# Patient Record
Sex: Female | Born: 1945
Health system: Southern US, Community
[De-identification: ages and names within clinical notes are randomized; demographics above are authoritative.]

## PROBLEM LIST (undated history)

## (undated) DIAGNOSIS — C801 Malignant (primary) neoplasm, unspecified: Secondary | ICD-10-CM

## (undated) DIAGNOSIS — E0590 Thyrotoxicosis, unspecified without thyrotoxic crisis or storm: Secondary | ICD-10-CM

## (undated) DIAGNOSIS — R7303 Prediabetes: Secondary | ICD-10-CM

## (undated) DIAGNOSIS — E559 Vitamin D deficiency, unspecified: Secondary | ICD-10-CM

## (undated) DIAGNOSIS — I10 Essential (primary) hypertension: Secondary | ICD-10-CM

## (undated) DIAGNOSIS — R269 Unspecified abnormalities of gait and mobility: Secondary | ICD-10-CM

## (undated) DIAGNOSIS — E785 Hyperlipidemia, unspecified: Secondary | ICD-10-CM

## (undated) DIAGNOSIS — M1990 Unspecified osteoarthritis, unspecified site: Secondary | ICD-10-CM

## (undated) DIAGNOSIS — Z9289 Personal history of other medical treatment: Secondary | ICD-10-CM

## (undated) DIAGNOSIS — E669 Obesity, unspecified: Secondary | ICD-10-CM

## (undated) DIAGNOSIS — D649 Anemia, unspecified: Secondary | ICD-10-CM

## (undated) DIAGNOSIS — G629 Polyneuropathy, unspecified: Secondary | ICD-10-CM

## (undated) DIAGNOSIS — Z972 Presence of dental prosthetic device (complete) (partial): Secondary | ICD-10-CM

## (undated) DIAGNOSIS — Z973 Presence of spectacles and contact lenses: Secondary | ICD-10-CM

## (undated) DIAGNOSIS — Z96642 Presence of left artificial hip joint: Secondary | ICD-10-CM

## (undated) HISTORY — PX: ROTATOR CUFF REPAIR: SHX139

## (undated) HISTORY — DX: Prediabetes: R73.03

## (undated) HISTORY — PX: CERVICAL DISC ARTHROPLASTY: SHX587

## (undated) HISTORY — DX: Presence of left artificial hip joint: Z96.642

## (undated) HISTORY — DX: Thyrotoxicosis, unspecified without thyrotoxic crisis or storm: E05.90

## (undated) HISTORY — DX: Vitamin D deficiency, unspecified: E55.9

## (undated) HISTORY — PX: SPINAL FUSION: SHX223

## (undated) HISTORY — PX: OTHER SURGICAL HISTORY: SHX169

## (undated) HISTORY — DX: Polyneuropathy, unspecified: G62.9

## (undated) HISTORY — DX: Obesity, unspecified: E66.9

## (undated) HISTORY — PX: MULTIPLE TOOTH EXTRACTIONS: SHX2053

## (undated) HISTORY — DX: Unspecified abnormalities of gait and mobility: R26.9

## (undated) HISTORY — PX: TONSILLECTOMY: SUR1361

## (undated) HISTORY — DX: Essential (primary) hypertension: I10

## (undated) HISTORY — DX: Hyperlipidemia, unspecified: E78.5

## (undated) HISTORY — PX: VAGINAL HYSTERECTOMY: SHX2639

---

## 1998-07-19 ENCOUNTER — Ambulatory Visit (HOSPITAL_COMMUNITY): Admission: RE | Admit: 1998-07-19 | Discharge: 1998-07-19 | Payer: Self-pay | Admitting: Internal Medicine

## 1999-04-30 ENCOUNTER — Ambulatory Visit (HOSPITAL_COMMUNITY): Admission: RE | Admit: 1999-04-30 | Discharge: 1999-04-30 | Payer: Self-pay | Admitting: Neurosurgery

## 1999-04-30 ENCOUNTER — Encounter: Payer: Self-pay | Admitting: Neurosurgery

## 1999-05-01 ENCOUNTER — Ambulatory Visit (HOSPITAL_COMMUNITY): Admission: RE | Admit: 1999-05-01 | Discharge: 1999-05-01 | Payer: Self-pay | Admitting: Neurosurgery

## 1999-05-01 ENCOUNTER — Encounter: Payer: Self-pay | Admitting: Neurosurgery

## 1999-07-30 ENCOUNTER — Encounter: Payer: Self-pay | Admitting: Neurosurgery

## 1999-07-30 ENCOUNTER — Ambulatory Visit (HOSPITAL_COMMUNITY): Admission: RE | Admit: 1999-07-30 | Discharge: 1999-07-30 | Payer: Self-pay | Admitting: Neurosurgery

## 1999-09-10 ENCOUNTER — Encounter: Payer: Self-pay | Admitting: Neurosurgery

## 1999-09-10 ENCOUNTER — Ambulatory Visit (HOSPITAL_COMMUNITY): Admission: RE | Admit: 1999-09-10 | Discharge: 1999-09-10 | Payer: Self-pay | Admitting: Neurosurgery

## 1999-10-14 ENCOUNTER — Ambulatory Visit (HOSPITAL_COMMUNITY): Admission: RE | Admit: 1999-10-14 | Discharge: 1999-10-14 | Payer: Self-pay | Admitting: Neurosurgery

## 2001-04-06 ENCOUNTER — Ambulatory Visit (HOSPITAL_COMMUNITY): Admission: RE | Admit: 2001-04-06 | Discharge: 2001-04-06 | Payer: Self-pay | Admitting: *Deleted

## 2001-04-06 ENCOUNTER — Encounter: Payer: Self-pay | Admitting: Neurosurgery

## 2001-04-09 ENCOUNTER — Ambulatory Visit (HOSPITAL_COMMUNITY): Admission: RE | Admit: 2001-04-09 | Discharge: 2001-04-09 | Payer: Self-pay | Admitting: Neurosurgery

## 2001-04-09 ENCOUNTER — Encounter: Payer: Self-pay | Admitting: Neurosurgery

## 2002-04-14 ENCOUNTER — Encounter: Admission: RE | Admit: 2002-04-14 | Discharge: 2002-04-14 | Payer: Self-pay | Admitting: Neurosurgery

## 2002-04-14 ENCOUNTER — Encounter: Payer: Self-pay | Admitting: Neurosurgery

## 2004-01-26 ENCOUNTER — Emergency Department (HOSPITAL_COMMUNITY): Admission: AD | Admit: 2004-01-26 | Discharge: 2004-01-26 | Payer: Self-pay | Admitting: Family Medicine

## 2004-03-01 ENCOUNTER — Ambulatory Visit (HOSPITAL_COMMUNITY): Admission: RE | Admit: 2004-03-01 | Discharge: 2004-03-01 | Payer: Self-pay | Admitting: Internal Medicine

## 2004-04-08 ENCOUNTER — Inpatient Hospital Stay (HOSPITAL_COMMUNITY): Admission: RE | Admit: 2004-04-08 | Discharge: 2004-04-12 | Payer: Self-pay | Admitting: Orthopedic Surgery

## 2004-07-23 ENCOUNTER — Ambulatory Visit (HOSPITAL_COMMUNITY): Admission: RE | Admit: 2004-07-23 | Discharge: 2004-07-24 | Payer: Self-pay | Admitting: Orthopedic Surgery

## 2007-02-24 ENCOUNTER — Other Ambulatory Visit: Admission: RE | Admit: 2007-02-24 | Discharge: 2007-02-24 | Payer: Self-pay | Admitting: Internal Medicine

## 2007-03-10 ENCOUNTER — Ambulatory Visit: Payer: Self-pay | Admitting: Gastroenterology

## 2007-03-29 ENCOUNTER — Ambulatory Visit: Payer: Self-pay | Admitting: Gastroenterology

## 2007-03-29 ENCOUNTER — Encounter (INDEPENDENT_AMBULATORY_CARE_PROVIDER_SITE_OTHER): Payer: Self-pay | Admitting: *Deleted

## 2009-03-16 ENCOUNTER — Ambulatory Visit (HOSPITAL_COMMUNITY): Admission: RE | Admit: 2009-03-16 | Discharge: 2009-03-16 | Payer: Self-pay | Admitting: Orthopedic Surgery

## 2009-04-18 ENCOUNTER — Ambulatory Visit (HOSPITAL_COMMUNITY): Admission: RE | Admit: 2009-04-18 | Discharge: 2009-04-18 | Payer: Self-pay | Admitting: Internal Medicine

## 2009-05-22 ENCOUNTER — Inpatient Hospital Stay (HOSPITAL_COMMUNITY): Admission: RE | Admit: 2009-05-22 | Discharge: 2009-05-26 | Payer: Self-pay | Admitting: Orthopedic Surgery

## 2009-08-22 ENCOUNTER — Encounter (HOSPITAL_COMMUNITY): Admission: RE | Admit: 2009-08-22 | Discharge: 2009-11-02 | Payer: Self-pay | Admitting: Internal Medicine

## 2009-08-27 ENCOUNTER — Encounter: Admission: RE | Admit: 2009-08-27 | Discharge: 2009-08-27 | Payer: Self-pay | Admitting: Internal Medicine

## 2009-09-19 ENCOUNTER — Other Ambulatory Visit: Admission: RE | Admit: 2009-09-19 | Discharge: 2009-09-19 | Payer: Self-pay | Admitting: Interventional Radiology

## 2009-09-19 ENCOUNTER — Encounter (INDEPENDENT_AMBULATORY_CARE_PROVIDER_SITE_OTHER): Payer: Self-pay | Admitting: Interventional Radiology

## 2009-09-19 ENCOUNTER — Encounter: Admission: RE | Admit: 2009-09-19 | Discharge: 2009-09-19 | Payer: Self-pay | Admitting: Endocrinology

## 2010-09-04 ENCOUNTER — Encounter (HOSPITAL_COMMUNITY)
Admission: RE | Admit: 2010-09-04 | Discharge: 2010-12-03 | Payer: Self-pay | Source: Home / Self Care | Admitting: Internal Medicine

## 2011-01-19 ENCOUNTER — Encounter: Payer: Self-pay | Admitting: Neurosurgery

## 2011-04-08 LAB — CBC
HCT: 24.8 % — ABNORMAL LOW (ref 36.0–46.0)
HCT: 29.8 % — ABNORMAL LOW (ref 36.0–46.0)
HCT: 36.3 % (ref 36.0–46.0)
Hemoglobin: 10.2 g/dL — ABNORMAL LOW (ref 12.0–15.0)
Hemoglobin: 8.5 g/dL — ABNORMAL LOW (ref 12.0–15.0)
Hemoglobin: 8.5 g/dL — ABNORMAL LOW (ref 12.0–15.0)
MCHC: 34.3 g/dL (ref 30.0–36.0)
MCHC: 34.5 g/dL (ref 30.0–36.0)
MCHC: 34.7 g/dL (ref 30.0–36.0)
MCHC: 34.9 g/dL (ref 30.0–36.0)
MCHC: 35.7 g/dL (ref 30.0–36.0)
MCV: 96.7 fL (ref 78.0–100.0)
MCV: 96.8 fL (ref 78.0–100.0)
MCV: 97 fL (ref 78.0–100.0)
MCV: 97.1 fL (ref 78.0–100.0)
MCV: 97.1 fL (ref 78.0–100.0)
Platelets: 171 10*3/uL (ref 150–400)
Platelets: 173 10*3/uL (ref 150–400)
Platelets: 231 10*3/uL (ref 150–400)
Platelets: 270 10*3/uL (ref 150–400)
RBC: 2.47 MIL/uL — ABNORMAL LOW (ref 3.87–5.11)
RBC: 2.5 MIL/uL — ABNORMAL LOW (ref 3.87–5.11)
RBC: 3.07 MIL/uL — ABNORMAL LOW (ref 3.87–5.11)
RBC: 4.13 MIL/uL (ref 3.87–5.11)
RDW: 13.5 % (ref 11.5–15.5)
RDW: 13.5 % (ref 11.5–15.5)
RDW: 13.5 % (ref 11.5–15.5)
RDW: 13.6 % (ref 11.5–15.5)
RDW: 13.7 % (ref 11.5–15.5)
WBC: 13.4 10*3/uL — ABNORMAL HIGH (ref 4.0–10.5)
WBC: 7.4 10*3/uL (ref 4.0–10.5)

## 2011-04-08 LAB — DIFFERENTIAL
Basophils Absolute: 0 10*3/uL (ref 0.0–0.1)
Basophils Absolute: 0 10*3/uL (ref 0.0–0.1)
Basophils Relative: 0 % (ref 0–1)
Basophils Relative: 0 % (ref 0–1)
Eosinophils Absolute: 0.2 10*3/uL (ref 0.0–0.7)
Eosinophils Relative: 3 % (ref 0–5)
Lymphocytes Relative: 38 % (ref 12–46)
Monocytes Absolute: 0.6 10*3/uL (ref 0.1–1.0)
Monocytes Relative: 6 % (ref 3–12)
Neutro Abs: 3.8 10*3/uL (ref 1.7–7.7)
Neutro Abs: 6.9 10*3/uL (ref 1.7–7.7)
Neutrophils Relative %: 63 % (ref 43–77)

## 2011-04-08 LAB — BASIC METABOLIC PANEL
BUN: 13 mg/dL (ref 6–23)
BUN: 20 mg/dL (ref 6–23)
CO2: 26 mEq/L (ref 19–32)
CO2: 27 mEq/L (ref 19–32)
CO2: 27 mEq/L (ref 19–32)
Calcium: 10 mg/dL (ref 8.4–10.5)
Calcium: 8.4 mg/dL (ref 8.4–10.5)
Chloride: 103 mEq/L (ref 96–112)
Chloride: 105 mEq/L (ref 96–112)
Creatinine, Ser: 0.94 mg/dL (ref 0.4–1.2)
Creatinine, Ser: 0.95 mg/dL (ref 0.4–1.2)
Creatinine, Ser: 1.13 mg/dL (ref 0.4–1.2)
GFR calc Af Amer: 59 mL/min — ABNORMAL LOW (ref >60)
GFR calc Af Amer: >60 mL/min (ref >60)
Glucose, Bld: 103 mg/dL — ABNORMAL HIGH (ref 70–99)
Glucose, Bld: 121 mg/dL — ABNORMAL HIGH (ref 70–99)

## 2011-04-08 LAB — APTT: aPTT: 28 seconds (ref 24–37)

## 2011-04-08 LAB — TYPE AND SCREEN
ABO/RH(D): O POS
Antibody Screen: NEGATIVE

## 2011-04-08 LAB — URINALYSIS, ROUTINE W REFLEX MICROSCOPIC
Bilirubin Urine: NEGATIVE
Hgb urine dipstick: NEGATIVE
Ketones, ur: NEGATIVE mg/dL
Nitrite: NEGATIVE
Protein, ur: NEGATIVE mg/dL
Urobilinogen, UA: 0.2 mg/dL (ref 0.0–1.0)

## 2011-04-08 LAB — ANAEROBIC CULTURE

## 2011-04-08 LAB — URINE CULTURE

## 2011-04-08 LAB — SEDIMENTATION RATE: Sed Rate: 7 mm/hr (ref 0–22)

## 2011-04-08 LAB — PROTIME-INR
INR: 0.9 (ref 0.00–1.49)
INR: 1.1 (ref 0.00–1.49)
Prothrombin Time: 12.7 seconds (ref 11.6–15.2)
Prothrombin Time: 14.6 seconds (ref 11.6–15.2)
Prothrombin Time: 25.6 seconds — ABNORMAL HIGH (ref 11.6–15.2)

## 2011-04-08 LAB — C-REACTIVE PROTEIN: CRP: 1.5 mg/dL — ABNORMAL HIGH (ref <0.6)

## 2011-04-08 LAB — BODY FLUID CULTURE

## 2011-04-08 LAB — ABO/RH: ABO/RH(D): O POS

## 2011-05-13 NOTE — Op Note (Signed)
NAMETAMEA, BAI                ACCOUNT NO.:  [id number]   MEDICAL RECORD NO.:  [id number]          PATIENT TYPE:  INP   LOCATION:  5036                         FACILITY:  [facility]   PHYSICIAN:  Burnard Bunting, M.D.    DATE OF BIRTH:  April 04, 1946   DATE OF PROCEDURE:  05/22/2009  DATE OF DISCHARGE:                               OPERATIVE REPORT   PREOPERATIVE DIAGNOSIS:  Loose right total knee arthroplasty.   POSTOPERATIVE DIAGNOSIS:  Loose right total knee arthroplasty.   PROCEDURE:  Right total knee arthroplasty revision.   SURGEON:  Burnard Bunting, MD   ASSISTANT:  April Breen   ANESTHESIA:  General endotracheal.   ESTIMATED BLOOD LOSS:  200.   INDICATIONS:  Tiffany [name] is a 65 year old female with loose right TKA  who presents now for operative management.  Preoperative laboratory  values show absence of infection.  Bone scan and plain x-rays showed  subsidence of the tibial component   OPERATIVE FINDINGS:  1. Examination under anesthesia, range of motion 0 to 120 degrees,      reasonable stability, varus-valgus stress.  2. At the time of arthrotomy, no obvious infection or acute      inflammation was noted.  There was serous fluid present within the      knee joint which was sent for culture.  No occult pockets of      purulence were present.   COMPONENTS UTILIZED:  DePuy, rotating platform, size-3 knee with  Universal revision stem fluted 75 x 16 mm with 8 mm posterior augments,  4 mm augment distally laterally, 8 mm augment distally medially with a 3  tibia with a 34 distal sleeve adapter with the sleeves on both the femur  and the tibia.   PROCEDURE IN DETAIL:  The patient was brought to the operating room  where general endotracheal anesthesia was induced.  Preoperative  antibiotics were administered.  Time-out was called.  Right leg was  prepped including the foot with DuraPrep solution and draped in a  sterile manner.  Collier Flowers was used to cover the operative  field.  The leg  was elevated and exsanguinated with an Esmarch.  Tourniquet was  inflated.  Median parapatellar arthrotomy was made over a bolster.  After the anterior approach to the knee, patella was everted.  Synovectomy was performed.  No acute inflammation or angry-appearing  synovitis was present.  The poly was removed from the tibial tray, which  was loose.  Then, the distal femur was resected by placing osteotomes  and oscillating saw at the prosthesis interface.  Femur was removed, had  mild loosening.  Tibia was also removed, it had significant loosening.  All excess cement was removed including the bone plugs.  At this time,  the tibia and femur were prepared in the standard manner for revision  arthroplasty.  It was felt that a sleeve would be necessary for both  sides due to metaphyseal bone loss .  Both canals were then reamed and  the trial stems were placed.  It was noted that distal and posterior  augments would  be necessary for the femur.  Tibial cut was relatively  flat following touch-up cut with the broaching stem in place.  Following  a cut on the femur and tibia, the rotation was set based on with the  knee in 90 degrees of flexion.  Box cut was then made on the femur.  Trial components were placed with excellent patellar tracking using no  thumb technique with a 15 and 17 spacer.  Trial components were then  removed and true components were cemented into position after thorough  irrigation.  Excess cement was removed.  The patient achieved full  extension and full extension with excellent range of motion.  Tourniquet  was released after cement hardening had occurred.  The bleeding points  were encountered and controlled with electrocautery.  Incision was then  closed over Hemovac drain after placing a 17-mm poly tray.  The knee was  then closed over bolster using interrupted 0 Vicryl suture, 2-0 Vicryl  suture, and skin staples.  Solution of Marcaine and morphine  finally  injected to the knee.  Bulky dressing was placed.  The patient had full  extension and good alignment.  The patient tolerated the procedure well  without any immediate complications.  April, RNFA's assistance was  required all times during the case for retraction of important  neurovascular structures or assistance.  Her assistance was a medical  necessity.      Burnard Bunting, M.D.  Electronically Signed     GSD/MEDQ  D:  05/22/2009  T:  05/23/2009  Job:  [id number]

## 2011-05-16 NOTE — Discharge Summary (Signed)
NAMENEVAEH, KORTE                ACCOUNT NO.:  [id number]   MEDICAL RECORD NO.:  [id number]          PATIENT TYPE:  INP   LOCATION:  5036                         FACILITY:  [facility]   PHYSICIAN:  Burnard Bunting, M.D.    DATE OF BIRTH:  1946-04-02   DATE OF ADMISSION:  05/22/2009  DATE OF DISCHARGE:  05/26/2009                               DISCHARGE SUMMARY   ADMISSION DIAGNOSES:  1. Loose right total knee arthroplasty.  2. Hypertension.   DISCHARGE DIAGNOSES:  1. Loose right total knee arthroplasty.  2. Hypertension.  3. Posthemorrhagic anemia.   PROCEDURE:  On May 22, 2009, the patient underwent right total knee  arthroplasty revision, performed by Dr. August Saucer under general anesthesia.   CONSULTATIONS:  None.   BRIEF HISTORY:  The patient is a 65 year old female with loosening of  the prosthesis of her right total knee replacement.  She has undergone  preoperative labs indicating absence of infection.  Bone scan and plain  x-rays have shown subsidence of the tibial component.  As the patient is  having difficulty with pain control and activity, it was felt she would  benefit from surgical intervention and she wished to proceed with  revision of the right total knee arthroplasty.   BRIEF HOSPITAL COURSE:  The patient underwent the procedure under  general anesthesia without complications.  Postoperatively,  neurovascular and motor function of the lower extremities was noted to  be intact.  Her dressing was changed daily and wound was healing without  signs of infection.  She started on physical therapy for ambulation and  gait training, range of motion, stretching and strengthening exercises.  CPM was utilized.  Coumadin was started for DVT prophylaxis and  adjustments made by the pharmacist at the hospital.  Pain was controlled  with p.o. analgesics prior to discharge.  The patient was able to void  after her Foley catheter was discontinued.  At the time of discharge,  she was  afebrile and vital signs were stable.  The patient was  ambulating as much as 70 feet.   PERTINENT LABORATORY VALUES:  On admission, CBC with WBC 11.1,  hemoglobin 13.9, and hematocrit 40.1.  WBC elevated as high as 13.4 on  May 22, 2009.  However, repeat values during the hospital stay showed  normalization of WBC with the lowest value being 8.0.  Hemoglobin and  hematocrit stable at 8.5 and 24.8 respectively.  Sed rate on May 22, 2009, was 7.  Chemistry studies on admission with sodium 134, potassium  5.3, and glucose 111.  Repeat values showed normalization of these  values with the exception of mildly elevated glucose at 103 and 121.  INR and coagulation studies were normal on admission and at discharge  INR 2.2.  C-reactive protein 1.5 on May 22, 2009.  Urinalysis on  admission negative for urinary tract infection.  Fluid from the right  knee on May 22, 2009, taken intraoperatively showed no growth x3 days on  final culture report on May 25, 2009.  Urine culture with final report  on May 18, 2009, showed 40,000  colonies of multiple bacterial  morphotypes.  Anaerobic cultures of the synovial fluid were negative on  final report.   PLAN:  The patient was discharged to home.  Arrangements were made for  home health physical therapy and Coumadin management as well as durable  medical equipment by [facility].  She will continue on CPM  machine 2 hours every 8 hours.  Elevation when at rest and weightbearing  as tolerated for ambulation with a walker.  She will keep her wound dry  and clean.  She will follow up with Dr. August Saucer 2 weeks from the date of  surgery.  The patient is given a medication reconciliation form with  instructions to continue home medications as taken prior to admission  with the exception of hydrocodone.  She will utilize Percocet as needed  for pain and is given a prescription for Coumadin as well.  She is  advised to call the office should there be  questions or concerns prior  to her return office visit.      Wende Neighbors, P.A.      Burnard Bunting, M.D.  Electronically Signed    SMV/MEDQ  D:  06/21/2009  T:  06/22/2009  Job:  [id number]

## 2011-05-16 NOTE — Op Note (Signed)
. [facility]  Patient:    Tiffany [name], Tiffany [name]                         MRN: [id number] Proc. Date: 04/09/01 Attending:  Julio Sicks, M.D.                           Operative Report  PREOPERATIVE DIAGNOSIS:  Left frontal scalp abscess.  POSTOPERATIVE DIAGNOSIS:  Left frontal scalp abscess.  PROCEDURE:  Incision and debridement of left frontal scalp abscess with closure.  SURGEON:  Julio Sicks, M.D.  ANESTHESIA:  General endotracheal.  INDICATIONS:  Ms. Zollinger is a 65 year old female, who had previously suffered a cervical spine fracture with associated spinal cord injury approximately 2-1/2 years ago.  The patient underwent a posterior cervical fusion and was placed in a Halo brace by an outside neurosurgeon.  The patient was from the [place] area and reported back to me for follow-up.  I followed her along and at 3 months post injury was ready for her Halo to come off.  The Halo was removed.  The patient was left with cosmetically unacceptable scars on her forehead secondary to the Halo pin sites.  We discussed options for management, including the possibility of excision of the scars with wound revision.  She underwent this approximately 1-1/2 years ago.  She now presents with a fluctuant left frontal mass beneath the left frontal repair at the pin site.  Head CT scan demonstrated no evidence of active osteomyelitis or violation of the intracranial sinuses.  We discussed options available for management, including the possibility of undergoing incision and debridement of the abscess to explore for retained foreign body or possibly a smoldering osteomyelitis.  Patient understood the risks and benefits and wishes to proceed.  OPERATIVE NOTE:  Patient taken to the operating room, placed on operating table in supine position.  After an adequate level of anesthesia was achieved, the patient was positioned supine.  Patients left frontal scalp was  prepped and draped sterilely.  A 15-blade was used to reopen the patients scalp incision.  The fluctuant mass was encountered.  This was dissected free around it.  The wall of the abscess was quite thin and did rupture.  Purulence was collected and sent for culture.  The wall of the abscess was completely excised and all purulence was completely washed and cleansed from the wound. The skull was inspected and found to be free of any active or obvious osteomyelitis.  The wound was copiously irrigated with antibiotic solution. The scalp was then reapproximated with two 5-0 PDS inverted, subcuticular stitches, as well as a running 5-0 nylon.  A Penrose drain was left in the subgaleal space.  There were no apparent complications.  The patient tolerated the procedure well and she returned to the recovery room postoperatively. DD:  04/09/01 TD:  04/09/01 Job: [id number]

## 2011-05-16 NOTE — Op Note (Signed)
NAME:  Tiffany [name], Tiffany [name]                          ACCOUNT NO.:  [id number]   MEDICAL RECORD NO.:  [id number]                   PATIENT TYPE:  OIB   LOCATION:  5010                                 FACILITY:  [facility]   PHYSICIAN:  Burnard Bunting, M.D.                 DATE OF BIRTH:  1946/10/08   DATE OF PROCEDURE:  07/23/2004  DATE OF DISCHARGE:  07/24/2004                                 OPERATIVE REPORT   PREOPERATIVE DIAGNOSIS:  Right shoulder rotator cuff tear and impingement.   POSTOPERATIVE DIAGNOSIS:  Right shoulder rotator cuff tear and impingement.   PROCEDURE:  Right shoulder diagnostic arthroscopy with debridement and  subacromial decompression with mini-open rotator cuff repair.   SURGEON:  Burnard Bunting, M.D.   ASSISTANT:  Sandrea Matte, P.A.-C.   ANESTHESIA:  General endotracheal anesthesia.   ESTIMATED BLOOD LOSS:  50 mL.   DRAINS:  None.   OPERATIVE FINDINGS:  1. Examination under anesthesia, range of motion, external rotation 15     degrees, abduction 80 degrees, forward flexion 180, internal rotation 90     degrees of abduction, external rotation 90 degrees abduction is 100.  The     shoulder stability is 1+ anterior posterior.  2. Diagnostic arthroscopy:  Mild fraying around the biceps tendon with     synovitis in the superior aspect of the shoulder, intact anterior and     posterior and inferior glenohumeral ligament, 8 mm area of near full     thickness chondral defect on the humeral head, intact glenoid articular     surface, 5% fraying of the biceps tendon but, overall, the biceps tendon     was intact, full thickness rotator cuff tear of the supraspinatus with     mild retraction.   PROCEDURE IN DETAIL:  The patient was brought to the operating room where  general endotracheal anesthesia was induced.  Preoperative IV antibiotics  were administered.  The patient was placed in a beach chair position with  the head in neutral position and the left arm and legs  well padded.  The  right arm, shoulder, and hand were prepped with DuraPrep solution and draped  in a sterile manner.  Topographic anatomy on the right shoulder was  identified including the posterolateral and anterior margin of the acromion  as well as the coracoid process and the distal clavicle.  The subacromial  space was injected with a solution of epinephrine and saline.  Saline was  then injected into the glenohumeral joint.  A posterior portal was made 2 cm  medial and inferior to the posterolateral margin of the acromion.  A trocar  was inserted into the glenohumeral space.  Diagnostic arthroscopy was  performed. The biceps tendon was intact.  No SLAP tear was identified.  Synovitis was present.  There was chondral defect on the humeral head.  The  glenoid articular surface was intact.  Posterior, anterior, and inferior  labrum was intact.  At this time, the debridement was performed of both the  rotator cuff tear and mild synovitis which was present.  No loose chondral  flaps  were noted on the humeral head.  The scope was then placed into the  subacromial space, a lateral portal was created, bursectomy was performed,  rotator cuff tear was visualized.  Coracoacromial ligament was released but  not resected.  A small bone spur was removed.  At this time, instruments  were removed from the portals.  The anterior and posterior portals were  closed using 3-0 nylon suture.  Collier Flowers was used to cover the operative field.  An incision was made from the anterolateral margin of the acromion distally.  The deltoid was split a measured distance of 3 cm and marked with 0 Vicryl  tagging suture.  Arthrex retractor was placed.  The rotator cuff tear was  identified.  The rotator cuff tear, itself, measured about 1.5 to 2 cm and  involved more a dissolution of the tendon just medial to the footprint.  Devitalized necrotic tendon was debrided.  The tendon was mobilized and  reattached through belt  suspenders repair using #2 FiberWire sutures as well  as one 6.5 mm corkscrew.  The repair was then oversewn with 0 Vicryl suture  to create a watertight repair.  The arm was taken through a range of motion  and the cuff was found to have excellent stability.  At this time, the  incision was irrigated.  The deltoid was closed using #1 Vicryl suture, the  skin was closed using interrupted inverted 2-0 Vicryl sutures and a running  3-0 pull out Prolene.  A bulky dressing was applied.  A shoulder immobilizer  was applied.  The patient tolerated the procedure well without any immediate  complications.                                               Burnard Bunting, M.D.    GSD/MEDQ  D:  07/24/2004  T:  07/24/2004  Job:  [id number]

## 2011-05-16 NOTE — Op Note (Signed)
Tiffany [name], Tiffany [name]                            ACCOUNT NO.:  [id number]   MEDICAL RECORD NO.:  [id number]                   PATIENT TYPE:  INP   LOCATION:  5030                                 FACILITY:  [facility]   PHYSICIAN:  Burnard Bunting, M.D.                 DATE OF BIRTH:  1946/03/05   DATE OF PROCEDURE:  04/08/2004  DATE OF DISCHARGE:                                 OPERATIVE REPORT   PREOPERATIVE DIAGNOSIS:  Right knee arthritis and avascular necrosis.   POSTOPERATIVE DIAGNOSIS:  Right knee arthritis and avascular necrosis.   OPERATION PERFORMED:  Right total knee arthroplasty.   SURGEON:  Burnard Bunting, M.D.   ASSISTANT:  Jerolyn Shin. Tresa Res, M.D.   ANESTHESIA:  General endotracheal.   ESTIMATED BLOOD LOSS:  100 mL.   DRAINS:  Hemovac times one.   IMPLANT:  Osteonics posterior stabilized, cemented 7 femur, 5 tibia, 5  patella, 10 poly.   TOURNIQUET TIME:  One hour and 26 minutes at 300 mmHg.   DESCRIPTION OF PROCEDURE:  The patient was brought to the operating room  where general endotracheal anesthesia was induced.  Preoperative IV  antibiotics were administered.  The right leg was prepped with DuraPrep  solution, draped in a sterile manner, Ioban was used to cover the operative  field.  The leg was elevated and exsanguinated with the Esmarch wrap.  Tourniquet was inflated.  Anterior approach to the knee was utilized.  Skin  and subcutaneous tissue was sharply divided.  Median parapatellar arthrotomy  was made.  Precise location of this arthrotomy was marked with a #1 Vicryl  suture at the proximal aspect of the patella.  The lateral patellofemoral  ligament was released.  Soft tissue was cleared only from the anterior  distal aspect of the femur.  The fat pad was partially excised.  The knee  was flexed and patella was everted.  The anterior and posterior cruciate  ligaments were released.  Intramedullary alignment rod was then utilized for  the distal femoral cut  which was cut at 9 mm.  The _________ was cut at 12  mm.  This was done with the collateral ligaments well protected.  Rotational  jig was in place on the cut distal end of the femur.  The femur was sized to  a size 7.  External rotation was parallel to the transepicondylar axis.  Again with collateral ligaments cut, the anterior, posterior and chamfer  cuts were made.  At this time the tibia was subluxated forward.  Menisci  were excised.  The tibial cut was made using intramedullary alignment.  Appropriate tibial cut was made with 4 mm resection based off the medial  side.  The spacer blocks were used to confirm symmetric flexion and  extension gaps.  The box cut was then made on the femur.  The posterior  capsule was released.  Trial components were  placed.  The patella was then  cut and three-peg formation was drilled.  Pre-resection patella width was  21, postresection was 11.  With the trial button in position, the knee had  excellent flexion, full extension with good stability at 0, 30 and 90  degrees of flexion.  At this time trial components were removed.  Cut bony  surfaces were irrigated.  Components were cemented into position beginning  with the tibia, femur and patella.  Excess cement was removed from the  intercondylar notch region as well as circumferentially around the implants.  After cement hardening had occurred, the knee was again taken through a  range of motion with a 10 polyethylene trial.  The true polyethylene was  placed after the patient had achieved full extension and full flexion with  good patellar tracking with no thumbs technique.  Tourniquet was released at  this time.  Bleeding points encountered were controlled using  electrocautery.  Incision was closed over a drain using 1 Vicryl to  reapproximate the arthrotomy.  The skin was closed using interrupted  inverted 2-0 Vicryl and skin staples.  The solution of Marcaine, morphine  and Celestone was then  placed into the knee.  The bulky dressing was then  placed.  The knee immobilizer was placed.  The patient tolerated the  procedure well without immediate complications and was transferred to the  recovery room in stable condition.                                               Burnard Bunting, M.D.    GSD/MEDQ  D:  04/08/2004  T:  04/09/2004  Job:  [id number]

## 2011-05-16 NOTE — Discharge Summary (Signed)
NAMEBRITINY, DEFRAIN                            ACCOUNT NO.:  [id number]   MEDICAL RECORD NO.:  [id number]                   PATIENT TYPE:  INP   LOCATION:  5030                                 FACILITY:  [facility]   PHYSICIAN:  Burnard Bunting, M.D.                 DATE OF BIRTH:  Oct 20, 1946   DATE OF ADMISSION:  04/08/2004  DATE OF DISCHARGE:  04/12/2004                                 DISCHARGE SUMMARY   DISCHARGE DIAGNOSIS:  Right knee avascular necrosis.   SECONDARY DIAGNOSIS:  None.   OPERATION AND PROCEDURES:  Right total knee replacement April 08, 2004.   HOSPITAL COURSE:  Tiffany [name] is a 65 year old patient with right knee  avascular necrosis. The patient underwent right total knee arthroplasty  April 08, 2004. She tolerated the procedure well without immediate  complications. On postoperative day #1, the patient had perfuse, sensate,  mobile right foot. Hemoglobin was 10 at that time. CPM was started on 45  degrees. She was started on Coumadin for DVT prophylaxis. Physical therapy  was also initiated to mobilize the patient. The patient was intact on  postoperative day #2. The patient had uneventful recovery. Doppler  ultrasounds performed April 12, 2004 were negative for avascular necrosis.  The patient was discharged to home in good condition. Her discharge  medications include admission medications plus Coumadin and Percocet and  Robaxin. She will follow up with me in one week for stable removal. Home  health PT or Coumadin draws and physical therapy will be initiated.  ______________ discharged in good condition.                                                Burnard Bunting, M.D.    GSD/MEDQ  D:  05/09/2004  T:  05/10/2004  Job:  [id number]

## 2012-02-25 ENCOUNTER — Other Ambulatory Visit (HOSPITAL_COMMUNITY): Payer: Self-pay | Admitting: Internal Medicine

## 2012-02-25 DIAGNOSIS — Z1231 Encounter for screening mammogram for malignant neoplasm of breast: Secondary | ICD-10-CM

## 2012-03-19 ENCOUNTER — Ambulatory Visit (HOSPITAL_COMMUNITY)
Admission: RE | Admit: 2012-03-19 | Discharge: 2012-03-19 | Disposition: A | Discharge status: Home / Self Care | Payer: BC Managed Care – PPO | Source: Ambulatory Visit | Attending: Internal Medicine | Admitting: Internal Medicine

## 2012-03-19 DIAGNOSIS — Z1231 Encounter for screening mammogram for malignant neoplasm of breast: Secondary | ICD-10-CM | POA: Insufficient documentation

## 2012-03-22 ENCOUNTER — Encounter: Payer: Self-pay | Admitting: Gastroenterology

## 2012-03-22 LAB — HM MAMMOGRAPHY: HM Mammogram: NORMAL

## 2012-11-03 ENCOUNTER — Encounter: Payer: Self-pay | Admitting: Gastroenterology

## 2013-05-14 ENCOUNTER — Encounter: Payer: Self-pay | Admitting: Neurology

## 2013-05-16 ENCOUNTER — Encounter: Payer: Self-pay | Admitting: Neurology

## 2013-05-16 ENCOUNTER — Ambulatory Visit (INDEPENDENT_AMBULATORY_CARE_PROVIDER_SITE_OTHER): Payer: BC Managed Care – PPO | Admitting: Neurology

## 2013-05-16 VITALS — BP 144/83 | HR 79 | Ht 65.0 in | Wt 200.0 lb

## 2013-05-16 DIAGNOSIS — R209 Unspecified disturbances of skin sensation: Secondary | ICD-10-CM

## 2013-05-16 DIAGNOSIS — R202 Paresthesia of skin: Secondary | ICD-10-CM

## 2013-05-16 DIAGNOSIS — I10 Essential (primary) hypertension: Secondary | ICD-10-CM

## 2013-05-16 DIAGNOSIS — R269 Unspecified abnormalities of gait and mobility: Secondary | ICD-10-CM

## 2013-05-16 NOTE — Progress Notes (Signed)
History of present illness:  Tiffany [name] is a 67 years old right-handed Caucasian female, referred by her primary care physician, Dr. Alfonse Ras and also orthopedic surgeon Dr. Toni Arthurs for evaluation of gait difficulty, right foot deformity.  She had a past medical history of hypertension, hyperlipidemia, reported a long-standing history of high arch feet, but she denies feet pain, numbness, or weakness, previously worked at [employer] job, also had a history of cervical C5 and 6 fracture after falling down steps in 2000, require surgical fixation  She presented with gradual onset gait difficulty for one year, also noticed the right foot deformity, right lateral foot clapped, touching the ground, there was no significant pain, numbness, or weakness,   She denies neck pain, low back pain, incontinence, she has bilaterally his paresthesia, there was a suspicious for a chuckle minor to his disease by Dr. Victorino Dike  Denies significant family history of peripheral neuropathy, her father in his late eighties developed bilateral feet paresthesia,   Review of Systems  Out of a complete 14 system review, the patient complains of only the following symptoms, and all other reviewed systems are negative.   Constitutional:   N/A Cardiovascular:  N/A Ear/Nose/Throat:  N/A Skin: N/A Eyes: N/A Respiratory: N/A Gastroitestinal: N/A    Hematology/Lymphatic:  N/A Endocrine:  N/A Musculoskeletal:N/A Allergy/Immunology: Allergies Neurological: N/A Psychiatric:    N/A  PHYSICAL EXAMINATOINS:  Generalized: In no acute distress  Neck: Supple, no carotid bruits   Cardiac: Regular rate rhythm  Pulmonary: Clear to auscultation bilaterally  Musculoskeletal: No deformity  Neurological examination  Mentation: Alert oriented to time, place, history taking, and causual conversation  Cranial nerve II-XII: Pupils were equal round reactive to light extraocular movements were full, visual field were full on  confrontational test. facial sensation and strength were normal. hearing was intact to finger rubbing bilaterally. Uvula tongue midline.  head turning and shoulder shrug and were normal and symmetric.Tongue protrusion into cheek strength was normal.  Motor: bilateral feet purplish discoloration, deformity of right foot, no significant distal or proximal weakness. Callous at right lateral foot   Sensory: Intact to fine touch, pinprick, preserved vibratory sensation, and proprioception at toes.  Coordination: Normal finger to nose, heel-to-shin bilaterally there was no truncal ataxia  Gait: Rising up from seated position without difficulty, wide based, unsteady, cautious gait, she could not stand on tiptoe or heels, could not do tandem walking.  Romberg signs: positive  Deep tendon reflexes: Brachioradialis 0/0, biceps 0/0, triceps 0/0, patellar 1/1, Achilles absent, plantar responses were flexor bilaterally.  Assessment and plan: 67 years old Caucasian female, with past medical history of high arch feet, worsening gait difficulty for one a year, discoloration, and deformity of right foot, there is length dependent sensory changes, progressive Romberg signs,  1. Suggestive of peripheral neuropathy hereditary vs. Acquired demyelinating in nature 2. physical therapy 3 laboratory evaluation for etiology 4. EMG nerve conduction study.

## 2013-05-17 LAB — COMPREHENSIVE METABOLIC PANEL
Albumin/Globulin Ratio: 1.8 (ref 1.1–2.5)
Albumin: 4.6 g/dL (ref 3.6–4.8)
Calcium: 9.9 mg/dL (ref 8.6–10.2)
Creatinine, Ser: 0.97 mg/dL (ref 0.57–1.00)
GFR calc non Af Amer: 62 mL/min/{1.73_m2} (ref >59)
Globulin, Total: 2.6 g/dL (ref 1.5–4.5)
Glucose: 90 mg/dL (ref 65–99)
Total Protein: 7.2 g/dL (ref 6.0–8.5)

## 2013-05-17 LAB — FOLATE: Folate: >19.9 ng/mL (ref >3.0)

## 2013-05-17 LAB — VITAMIN B12: Vitamin B-12: 737 pg/mL (ref 211–946)

## 2013-05-17 LAB — C-REACTIVE PROTEIN: CRP: 11.8 mg/L — ABNORMAL HIGH (ref 0.0–4.9)

## 2013-05-17 LAB — ANA: Anti Nuclear Antibody(ANA): NEGATIVE

## 2013-05-17 LAB — TSH: TSH: 0.183 u[IU]/mL — ABNORMAL LOW (ref 0.450–4.500)

## 2013-05-17 LAB — SEDIMENTATION RATE: Sed Rate: 8 mm/hr (ref 0–40)

## 2013-05-17 LAB — CK: Total CK: 252 U/L — ABNORMAL HIGH (ref 24–173)

## 2013-05-24 ENCOUNTER — Telehealth: Payer: Self-pay | Admitting: Neurology

## 2013-05-27 NOTE — Telephone Encounter (Signed)
Cannot take this pt on as they do not have a contract with [employer].  See previous message.

## 2013-05-30 ENCOUNTER — Encounter (INDEPENDENT_AMBULATORY_CARE_PROVIDER_SITE_OTHER): Payer: BC Managed Care – PPO

## 2013-05-30 ENCOUNTER — Ambulatory Visit (INDEPENDENT_AMBULATORY_CARE_PROVIDER_SITE_OTHER): Payer: BC Managed Care – PPO | Admitting: Neurology

## 2013-05-30 DIAGNOSIS — M545 Low back pain: Secondary | ICD-10-CM

## 2013-05-30 DIAGNOSIS — I10 Essential (primary) hypertension: Secondary | ICD-10-CM

## 2013-05-30 DIAGNOSIS — Z0289 Encounter for other administrative examinations: Secondary | ICD-10-CM

## 2013-05-30 NOTE — Procedures (Signed)
History of present illness: 67 years old Caucasian female, was low back pain, shooting pain to bilateral hip region, which has resolved, she has a long-standing history of high arched feet, now noticed deformity of her right foot, worsening gait difficulty  On examination: She has mild bilateral ankle dorsi flexion weakness, length dependent sensory changes, right knee replacement, absent ankle reflexes  Nerve conduction study: Bilateral peroneal sensory responses were absent. Bilateral tibial motor responses showed severely decreased CMAP amplitude, normal distal latency, conduction velocity, bilateral tibial H. reflexes were absent. Left peroneal to EDB motor response showed severely decreased C. map amplitude. Right peroneal EDB motor responses were normal  Right median sensory and motor responses were normal.  Electromyography:   Selected needle examination was performed at bilateral lower extremity muscles, right lumbosacral paraspinal muscles.  Bilateral tibialis anterior, normally insertion activity, no spontaneous activity, mixture of normal, some enlarged motor unit potential, with decreased recruitment patterns  Right tibialis posterior, medial gastrocnemius, peroneal longus, vastus lateralis,Biceps femoris short head was normal  There was no spontaneous activity at the right lumbosacral paraspinal muscles, right L4, L5, S1  In conclusion: This is an abnormal study, there is electrodiagnostic evidence of mildly length dependent axonal peripheral neuropathy, in addition there is chronic neuropathic changes in adding bilateral L5 myotomes,suggestive of bilateral lumbar radiculopathies, MRI of lumbar is planned

## 2013-06-01 ENCOUNTER — Telehealth: Payer: Self-pay | Admitting: Neurology

## 2013-06-01 NOTE — Telephone Encounter (Signed)
Patient is calling to inquire about her appointment for her MRI.  She has a f/u with Dr. Terrace Arabia on 6/18/  And is afraid she won't get her MRI before her f/u.  Please call at earliest convenience.

## 2013-06-03 ENCOUNTER — Ambulatory Visit
Admission: RE | Admit: 2013-06-03 | Discharge: 2013-06-03 | Disposition: A | Discharge status: Home / Self Care | Payer: BC Managed Care – PPO | Source: Ambulatory Visit | Attending: Neurology | Admitting: Neurology

## 2013-06-03 DIAGNOSIS — M545 Low back pain, unspecified: Secondary | ICD-10-CM

## 2013-06-03 DIAGNOSIS — M79609 Pain in unspecified limb: Secondary | ICD-10-CM

## 2013-06-08 ENCOUNTER — Ambulatory Visit: Payer: BC Managed Care – PPO | Admitting: Neurology

## 2013-06-10 NOTE — Progress Notes (Signed)
Quick Note:  Please call patient mild abnormal MRI lumbar, degenerative disc disease, keep followup appt ______

## 2013-06-13 ENCOUNTER — Telehealth: Payer: Self-pay | Admitting: Neurology

## 2013-06-13 NOTE — Telephone Encounter (Signed)
I spoke to patient and relayed MRI lumbar results, per Dr. Terrace Arabia.  Patient will follow up on 06-15-13.

## 2013-06-13 NOTE — Telephone Encounter (Signed)
Message copied by Elisha Headland on Mon Jun 13, 2013  4:54 PM ------      Message from: Levert Feinstein      Created: Fri Jun 10, 2013 10:53 AM       Please call patient mild abnormal MRI lumbar, degenerative disc disease, keep followup appt ------

## 2013-06-15 ENCOUNTER — Ambulatory Visit (INDEPENDENT_AMBULATORY_CARE_PROVIDER_SITE_OTHER): Payer: BC Managed Care – PPO | Admitting: Neurology

## 2013-06-15 ENCOUNTER — Encounter: Payer: Self-pay | Admitting: Neurology

## 2013-06-15 VITALS — Ht 65.0 in | Wt 209.0 lb

## 2013-06-15 DIAGNOSIS — M14671 Charcot's joint, right ankle and foot: Secondary | ICD-10-CM | POA: Insufficient documentation

## 2013-06-15 DIAGNOSIS — M79671 Pain in right foot: Secondary | ICD-10-CM

## 2013-06-15 DIAGNOSIS — M79609 Pain in unspecified limb: Secondary | ICD-10-CM

## 2013-06-15 NOTE — Progress Notes (Signed)
History of present illness:  Tiffany [name] is a 67 years old right-handed Caucasian female, referred by her primary care physician, Dr. Alfonse Ras and also orthopedic surgeon Dr. Toni Arthurs for evaluation of gait difficulty, right foot deformity.  She had a past medical history of hypertension, hyperlipidemia, reported a long-standing history of high arch feet, but she denies feet pain, numbness, or weakness, previously worked at [employer] job, also had a history of cervical C5 and 6 fracture after falling down steps in 2000, require surgical fixation, she denies history of walking previously  She presented with sudden onset of low back pain and radiating pain to her legs, followed by gradual onset gait difficulty since early 2013, also noticed the right foot deformity, right lateral foot clapped, touching the ground, there was no significant numbness, or weakness, but right foot pain with prolonged walking, bearing weight.    She denies neck pain, low back pain, incontinence, she has bilaterally his paresthesia, there was a suspicious for Charcot-Marie-Tooth disease by Dr. Victorino Dike  She denies significant family history of peripheral neuropathy, her father in his late eighties developed bilateral feet paresthesia,  UPDATE June 18th 2014,  MRI scan the lumbar spine showed mild disc and facet degenerative changes with mild bilateral foramina narrowing at L4-5 and L5-S1 but without definite foraminal compression.  She has no neck pain, she denies bowel and bladder incontinence, she complains of right lateral foot pain after bearing weight, she is planning on to have a re-evaluation, potentially surgical correction   Laboratory evaluation showed normal or negative CMP, RPR, B12, HIV, folic acid, ANA, CPK, ESR, decreased TSH 0.18, elevated C-reactive protein 11.8 E MG nerve conduction study showed findings consistent with mild to moderate axonal peripheral neuropathy  Review of Systems  Out of a complete  14 system review, the patient complains of only the following symptoms, and all other reviewed systems are negative.   Constitutional:   N/A Cardiovascular:  N/A Ear/Nose/Throat:  N/A Skin: N/A Eyes: N/A Respiratory: N/A Gastroitestinal: N/A    Hematology/Lymphatic:  N/A Endocrine:  N/A Musculoskeletal:N/A Allergy/Immunology: Allergies Neurological: N/A Psychiatric:    N/A  PHYSICAL EXAMINATOINS:  Generalized: In no acute distress  Neck: Supple, no carotid bruits   Cardiac: Regular rate rhythm  Pulmonary: Clear to auscultation bilaterally  Musculoskeletal: No deformity  Neurological examination  Mentation: Alert oriented to time, place, history taking, and causual conversation  Cranial nerve II-XII: Pupils were equal round reactive to light extraocular movements were full, visual field were full on confrontational test. facial sensation and strength were normal. hearing was intact to finger rubbing bilaterally. Uvula tongue midline.  head turning and shoulder shrug and were normal and symmetric.Tongue protrusion into cheek strength was normal.  Motor: bilateral feet purplish discoloration, deformity of right foot, no significant distal or proximal weakness. Callous at right lateral foot, tenderness of right lateral foot upon deep palpitation, and right 1st toe.  Sensory: length dependent decreased to fine touch, pinprick to above ankle level, decreased vibratory sensation to ankle level, and preserved proprioception at toes.  Coordination: Normal finger to nose, heel-to-shin bilaterally there was no truncal ataxia  Gait: Rising up from seated position without difficulty, wide based, unsteady, cautious gait, she could not stand on tiptoe or heels, could not do tandem walking.  Romberg signs: positive  Deep tendon reflexes: Brachioradialis 0/0, biceps 0/0, triceps 0/0, patellar 1/1, Achilles absent, plantar responses were flexor bilaterally.  Assessment and plan: 67 years  old Caucasian female, with past medical history  of high arch feet, worsening gait difficulty for one a year, discoloration, and deformity of right foot, there is length dependent sensory changes, progressive Romberg signs,  1.  her gait difficulty are likely a combination of mild to moderate axonal peripheral neuropathy, bilateral foot deformity, right foot pain, collapsed right foot arch 2.  continue treatment plan with her podiatrist, return to clinic in 6 months

## 2013-06-15 NOTE — Progress Notes (Deleted)
Subjective:    Patient ID: Tiffany [name] is a 67 y.o. female.  HPI {Common ambulatory SmartLinks:19316}  Review of Systems  Neurological:       Balance  Gait     Objective:  Neurologic Exam  Physical Exam  Assessment:   ***  Plan:   ***

## 2013-11-21 ENCOUNTER — Encounter: Payer: Self-pay | Admitting: Internal Medicine

## 2013-11-22 ENCOUNTER — Ambulatory Visit: Payer: Medicare Other | Admitting: Emergency Medicine

## 2013-11-22 ENCOUNTER — Other Ambulatory Visit: Payer: Self-pay | Admitting: Emergency Medicine

## 2013-11-22 ENCOUNTER — Encounter: Payer: Self-pay | Admitting: Emergency Medicine

## 2013-11-22 VITALS — BP 112/80 | HR 78 | Temp 98.0°F | Resp 18 | Ht 65.0 in | Wt 215.0 lb

## 2013-11-22 DIAGNOSIS — F17200 Nicotine dependence, unspecified, uncomplicated: Secondary | ICD-10-CM

## 2013-11-22 DIAGNOSIS — Z23 Encounter for immunization: Secondary | ICD-10-CM

## 2013-11-22 DIAGNOSIS — E782 Mixed hyperlipidemia: Secondary | ICD-10-CM

## 2013-11-22 DIAGNOSIS — I10 Essential (primary) hypertension: Secondary | ICD-10-CM | POA: Diagnosis not present

## 2013-11-22 DIAGNOSIS — M14679 Charcot's joint, unspecified ankle and foot: Secondary | ICD-10-CM

## 2013-11-22 DIAGNOSIS — R7309 Other abnormal glucose: Secondary | ICD-10-CM

## 2013-11-22 DIAGNOSIS — J309 Allergic rhinitis, unspecified: Secondary | ICD-10-CM

## 2013-11-22 DIAGNOSIS — E0590 Thyrotoxicosis, unspecified without thyrotoxic crisis or storm: Secondary | ICD-10-CM | POA: Diagnosis not present

## 2013-11-22 LAB — CBC WITH DIFFERENTIAL/PLATELET
Basophils Absolute: 0 10*3/uL (ref 0.0–0.1)
Eosinophils Absolute: 0.2 10*3/uL (ref 0.0–0.7)
Eosinophils Relative: 2 % (ref 0–5)
Hemoglobin: 14.2 g/dL (ref 12.0–15.0)
MCH: 33.2 pg (ref 26.0–34.0)
MCHC: 34.3 g/dL (ref 30.0–36.0)
MCV: 96.7 fL (ref 78.0–100.0)
Monocytes Absolute: 0.7 10*3/uL (ref 0.1–1.0)
Platelets: 288 10*3/uL (ref 150–400)
RDW: 14.1 % (ref 11.5–15.5)

## 2013-11-22 LAB — BASIC METABOLIC PANEL WITH GFR
Calcium: 9.1 mg/dL (ref 8.4–10.5)
GFR, Est African American: 48 mL/min — ABNORMAL LOW
GFR, Est Non African American: 41 mL/min — ABNORMAL LOW
Potassium: 4.8 mEq/L (ref 3.5–5.3)
Sodium: 135 mEq/L (ref 135–145)

## 2013-11-22 LAB — HEMOGLOBIN A1C: Hgb A1c MFr Bld: 5.3 % (ref <5.7)

## 2013-11-22 LAB — HEPATIC FUNCTION PANEL
Bilirubin, Direct: 0.1 mg/dL (ref 0.0–0.3)
Indirect Bilirubin: 0.3 mg/dL (ref 0.0–0.9)
Total Protein: 6.8 g/dL (ref 6.0–8.3)

## 2013-11-22 LAB — LIPID PANEL
HDL: 69 mg/dL (ref >39)
LDL Cholesterol: 72 mg/dL (ref 0–99)
Total CHOL/HDL Ratio: 2.7 Ratio
Triglycerides: 235 mg/dL — ABNORMAL HIGH (ref <150)
VLDL: 47 mg/dL — ABNORMAL HIGH (ref 0–40)

## 2013-11-22 NOTE — Patient Instructions (Addendum)
Smoking Cessation, Tips for Success YOU CAN QUIT SMOKING If you are ready to quit smoking, congratulations! You have chosen to help yourself be healthier. Cigarettes bring nicotine, tar, carbon monoxide, and other irritants into your body. Your lungs, heart, and blood vessels will be able to work better without these poisons. There are many different ways to quit smoking. Nicotine gum, nicotine patches, a nicotine inhaler, or nicotine nasal spray can help with physical craving. Hypnosis, support groups, and medicines help break the habit of smoking. Here are some tips to help you quit for good.  Throw away all cigarettes.  Clean and remove all ashtrays from your home, work, and car.  On a card, write down your reasons for quitting. Carry the card with you and read it when you get the urge to smoke.  Cleanse your body of nicotine. Drink enough water and fluids to keep your urine clear or pale yellow. Do this after quitting to flush the nicotine from your body.  Learn to predict your moods. Do not let a bad situation be your excuse to have a cigarette. Some situations in your life might tempt you into wanting a cigarette.  Never have "just one" cigarette. It leads to wanting another and another. Remind yourself of your decision to quit.  Change habits associated with smoking. If you smoked while driving or when feeling stressed, try other activities to replace smoking. Stand up when drinking your coffee. Brush your teeth after eating. Sit in a different chair when you read the paper. Avoid alcohol while trying to quit, and try to drink fewer caffeinated beverages. Alcohol and caffeine may urge you to smoke.  Avoid foods and drinks that can trigger a desire to smoke, such as sugary or spicy foods and alcohol.  Ask people who smoke not to smoke around you.  Have something planned to do right after eating or having a cup of coffee. Take a walk or exercise to perk you up. This will help to keep you  from overeating.  Try a relaxation exercise to calm you down and decrease your stress. Remember, you may be tense and nervous for the first 2 weeks after you quit, but this will pass.  Find new activities to keep your hands busy. Play with a pen, coin, or rubber band. Doodle or draw things on paper.  Brush your teeth right after eating. This will help cut down on the craving for the taste of tobacco after meals. You can try mouthwash, too.  Use oral substitutes, such as lemon drops, carrots, a cinnamon stick, or chewing gum, in place of cigarettes. Keep them handy so they are available when you have the urge to smoke.  When you have the urge to smoke, try deep breathing.  Designate your home as a nonsmoking area.  If you are a heavy smoker, ask your caregiver about a prescription for nicotine chewing gum. It can ease your withdrawal from nicotine.  Reward yourself. Set aside the cigarette money you save and buy yourself something nice.  Look for support from others. Join a support group or smoking cessation program. Ask someone at home or at work to help you with your plan to quit smoking.  Always ask yourself, "Do I need this cigarette or is this just a reflex?" Tell yourself, "Today, I choose not to smoke," or "I do not want to smoke." You are reminding yourself of your decision to quit, even if you do smoke a cigarette. HOW WILL I FEEL WHEN   I QUIT SMOKING?  The benefits of not smoking start within days of quitting.  You may have symptoms of withdrawal because your body is used to nicotine (the addictive substance in cigarettes). You may crave cigarettes, be irritable, feel very hungry, cough often, get headaches, or have difficulty concentrating.  The withdrawal symptoms are only temporary. They are strongest when you first quit but will go away within 10 to 14 days.  When withdrawal symptoms occur, stay in control. Think about your reasons for quitting. Remind yourself that these are  signs that your body is healing and getting used to being without cigarettes.  Remember that withdrawal symptoms are easier to treat than the major diseases that smoking can cause.  Even after the withdrawal is over, expect periodic urges to smoke. However, these cravings are generally short-lived and will go away whether you smoke or not. Do not smoke!  If you relapse and smoke again, do not lose hope. Most smokers quit 3 times before they are successful.  If you relapse, do not give up! Plan ahead and think about what you will do the next time you get the urge to smoke. LIFE AS A NONSMOKER: MAKE IT FOR A MONTH, MAKE IT FOR LIFE Day 1: Hang this page where you will see it every day. Day 2: Get rid of all ashtrays, matches, and lighters. Day 3: Drink water. Breathe deeply between sips. Day 4: Avoid places with smoke-filled air, such as bars, clubs, or the smoking section of restaurants. Day 5: Keep track of how much money you save by not smoking. Day 6: Avoid boredom. Keep a good book with you or go to the movies. Day 7: Reward yourself! One week without smoking! Day 8: Make a dental appointment to get your teeth cleaned. Day 9: Decide how you will turn down a cigarette before it is offered to you. Day 10: Review your reasons for quitting. Day 11: Distract yourself. Stay active to keep your mind off smoking and to relieve tension. Take a walk, exercise, read a book, do a crossword puzzle, or try a new hobby. Day 12: Exercise. Get off the bus before your stop or use stairs instead of escalators. Day 13: Call on friends for support and encouragement. Day 14: Reward yourself! Two weeks without smoking! Day 15: Practice deep breathing exercises. Day 16: Bet a friend that you can stay a nonsmoker. Day 17: Ask to sit in nonsmoking sections of restaurants. Day 18: Hang up "No Smoking" signs. Day 19: Think of yourself as a nonsmoker. Day 20: Each morning, tell yourself you will not smoke. Day  21: Reward yourself! Three weeks without smoking! Day 22: Think of smoking in negative ways. Remember how it stains your teeth, gives you bad breath, and leaves you short of breath. Day 23: Eat a nutritious breakfast. Day 24:Do not relive your days as a smoker. Day 25: Hold a pencil in your hand when talking on the telephone. Day 26: Tell all your friends you do not smoke. Day 27: Think about how much better food tastes. Day 28: Remember, one cigarette is one too many. Day 29: Take up a hobby that will keep your hands busy. Day 30: Congratulations! One month without smoking! Give yourself a big reward. Your caregiver can direct you to community resources or hospitals for support, which may include:  Group support.  Education.  Hypnosis.  Subliminal therapy. Document Released: 09/12/2004 Document Revised: 03/08/2012 Document Reviewed: 06/02/2013 [facility] Patient Information 2014 [place], [place]. Allergic  Rhinitis Allergic rhinitis is when the mucous membranes in the nose respond to allergens. Allergens are particles in the air that cause your body to have an allergic reaction. This causes you to release allergic antibodies. Through a chain of events, these eventually cause you to release histamine into the blood stream (hence the use of antihistamines). Although meant to be protective to the body, it is this release that causes your discomfort, such as frequent sneezing, congestion and an itchy runny nose.  CAUSES  The pollen allergens may come from grasses, trees, and weeds. This is seasonal allergic rhinitis, or "hay fever." Other allergens cause year-round allergic rhinitis (perennial allergic rhinitis) such as house dust mite allergen, pet dander and mold spores.  SYMPTOMS   Nasal stuffiness (congestion).  Runny, itchy nose with sneezing and tearing of the eyes.  There is often an itching of the mouth, eyes and ears. It cannot be cured, but it can be controlled with  medications. DIAGNOSIS  If you are unable to determine the offending allergen, skin or blood testing may find it. TREATMENT   Avoid the allergen.  Medications and allergy shots (immunotherapy) can help.  Hay fever may often be treated with antihistamines in pill or nasal spray forms. Antihistamines block the effects of histamine. There are over-the-counter medicines that may help with nasal congestion and swelling around the eyes. Check with your caregiver before taking or giving this medicine. If the treatment above does not work, there are many new medications your caregiver can prescribe. Stronger medications may be used if initial measures are ineffective. Desensitizing injections can be used if medications and avoidance fails. Desensitization is when a patient is given ongoing shots until the body becomes less sensitive to the allergen. Make sure you follow up with your caregiver if problems continue. SEEK MEDICAL CARE IF:   You develop fever (more than 100.5 F (38.1 C).  You develop a cough that does not stop easily (persistent).  You have shortness of breath.  You start wheezing.  Symptoms interfere with normal daily activities. Document Released: 09/09/2001 Document Revised: 03/08/2012 Document Reviewed: 03/21/2009 [facility] Patient Information 2014 [place], [place].

## 2013-11-22 NOTE — Progress Notes (Signed)
Subjective:    Patient ID: Tiffany [name], female    DOB: 05-Aug-1946, 67 y.o.   MRN: [id number]  HPI Comments: 67 YO FEMALE presents for 3 month F/U for HTN, Cholesterol, Pre-Dm, D. Deficient. She has not been able to exercise due to Charcot foot on Right and broken bone on Left. She has f/u ortho soon. She has tried to improve diet. She has decreased tobacco to 1/2 PPD. LAST LABS T 184 TG 174 H 76 L73 A1C 5.3 BP has been good at home.   TSH has been mildly out of range with close monitoring. She has mild fatigue but thinks related to pain with feet and lack of activity.  Hypertension  Hyperlipidemia    Current Outpatient Prescriptions on File Prior to Visit  Medication Sig Dispense Refill  . ATENOLOL PO Take 100 mg by mouth as directed.       . cetirizine (ZYRTEC) 10 MG tablet Take 10 mg by mouth daily.      . Cholecalciferol (VITAMIN D PO) Take by mouth.      . enalapril (VASOTEC) 20 MG tablet Take 20 mg by mouth daily.      . FUROSEMIDE PO Take 40 mg by mouth daily.       . Multiple Vitamins-Minerals (MULTIVITAMIN PO) Take by mouth.      . pravastatin (PRAVACHOL) 40 MG tablet Take 40 mg by mouth daily.       No current facility-administered medications on file prior to visit.   ALLERGIES Claritin-d 12 hour  Past Medical History  Diagnosis Date  . High blood pressure   . Gait difficulty   . Balance disorder   . Hyperlipidemia   . Pre-diabetes   . Vitamin D deficiency   . Hypothyroidism mild  . Obesity (BMI 35.0-39.9 without comorbidity)   . Neuropathy      Review of Systems  Constitutional: Positive for fatigue.  Musculoskeletal: Positive for arthralgias and joint swelling.  All other systems reviewed and are negative.    BP 112/80  Pulse 78  Temp(Src) 98 F (36.7 C) (Temporal)  Resp 18  Ht 5\' 5"  (1.651 m)  Wt 215 lb (97.523 kg)  BMI 35.78 kg/m2     Objective:   Physical Exam  Nursing note and vitals reviewed. Constitutional: She is oriented to  person, place, and time. She appears well-developed and well-nourished.  HENT:  Head: Normocephalic and atraumatic.  Right Ear: External ear normal.  Left Ear: External ear normal.  Nose: Nose normal.  Mouth/Throat: Oropharynx is clear and moist. No oropharyngeal exudate.  Eyes: Pupils are equal, round, and reactive to light.  Neck: Normal range of motion. No thyromegaly present.  Cardiovascular: Normal rate, regular rhythm, normal heart sounds and intact distal pulses.   Pulmonary/Chest: Effort normal and breath sounds normal.  Abdominal: Soft. Bowel sounds are normal.  Musculoskeletal: She exhibits edema.  Decreased ROM bilateral feet with pain and mild edema  Lymphadenopathy:    She has no cervical adenopathy.  Neurological: She is alert and oriented to person, place, and time.  Skin: Skin is warm and dry.  Psychiatric: She has a normal mood and affect. Judgment normal.          Assessment & Plan:  1. 3 month F/U for HTN, Cholesterol, Pre-Dm, D. Deficient, hyperthyroid- check labs, improve diet, exercise and wt loss 2. Foot pain- keep ortho f/u AD try to d/c tobacco and increase h2o and activity to improve circulation.  3. Tobacco cessation education  given

## 2013-11-23 ENCOUNTER — Encounter: Payer: Self-pay | Admitting: Emergency Medicine

## 2013-11-28 ENCOUNTER — Other Ambulatory Visit: Payer: Self-pay | Admitting: Emergency Medicine

## 2013-11-28 DIAGNOSIS — E0590 Thyrotoxicosis, unspecified without thyrotoxic crisis or storm: Secondary | ICD-10-CM

## 2013-11-30 ENCOUNTER — Telehealth: Payer: Self-pay | Admitting: *Deleted

## 2013-11-30 NOTE — Telephone Encounter (Signed)
Tell patient MS & I have discussed & agree she can wait since she is Asx

## 2013-11-30 NOTE — Telephone Encounter (Signed)
Pt aware of comment from Dr Oneta Rack

## 2013-11-30 NOTE — Telephone Encounter (Signed)
Pt is wanting to speak with cynthia about thyroid test, pt would not explain. Please call pt

## 2013-12-01 ENCOUNTER — Other Ambulatory Visit: Payer: BC Managed Care – PPO

## 2013-12-15 ENCOUNTER — Ambulatory Visit: Payer: BC Managed Care – PPO | Admitting: Neurology

## 2014-01-05 ENCOUNTER — Other Ambulatory Visit: Payer: Self-pay | Admitting: Physician Assistant

## 2014-01-16 ENCOUNTER — Encounter: Payer: Self-pay | Admitting: Physician Assistant

## 2014-02-16 ENCOUNTER — Encounter: Payer: Self-pay | Admitting: Physician Assistant

## 2014-03-13 ENCOUNTER — Other Ambulatory Visit: Payer: Self-pay | Admitting: Internal Medicine

## 2014-03-13 MED ORDER — CYCLOBENZAPRINE HCL 10 MG PO TABS: ORAL_TABLET | ORAL | Status: DC

## 2014-03-13 MED ORDER — PREDNISONE 10 MG PO TABS: ORAL_TABLET | ORAL | Status: DC

## 2014-04-04 ENCOUNTER — Ambulatory Visit (INDEPENDENT_AMBULATORY_CARE_PROVIDER_SITE_OTHER): Payer: Medicare Other | Admitting: Physician Assistant

## 2014-04-04 ENCOUNTER — Encounter: Payer: Self-pay | Admitting: Physician Assistant

## 2014-04-04 VITALS — BP 110/78 | HR 80 | Temp 97.7°F | Resp 16 | Ht 65.0 in | Wt 209.0 lb

## 2014-04-04 DIAGNOSIS — E782 Mixed hyperlipidemia: Secondary | ICD-10-CM | POA: Insufficient documentation

## 2014-04-04 DIAGNOSIS — Z79899 Other long term (current) drug therapy: Secondary | ICD-10-CM

## 2014-04-04 DIAGNOSIS — M5430 Sciatica, unspecified side: Secondary | ICD-10-CM

## 2014-04-04 DIAGNOSIS — M5431 Sciatica, right side: Secondary | ICD-10-CM

## 2014-04-04 DIAGNOSIS — E559 Vitamin D deficiency, unspecified: Secondary | ICD-10-CM

## 2014-04-04 DIAGNOSIS — Z0000 Encounter for general adult medical examination without abnormal findings: Secondary | ICD-10-CM

## 2014-04-04 DIAGNOSIS — E785 Hyperlipidemia, unspecified: Secondary | ICD-10-CM | POA: Diagnosis not present

## 2014-04-04 DIAGNOSIS — Z1331 Encounter for screening for depression: Secondary | ICD-10-CM | POA: Diagnosis not present

## 2014-04-04 DIAGNOSIS — I10 Essential (primary) hypertension: Secondary | ICD-10-CM | POA: Diagnosis not present

## 2014-04-04 DIAGNOSIS — F17200 Nicotine dependence, unspecified, uncomplicated: Secondary | ICD-10-CM

## 2014-04-04 DIAGNOSIS — E2839 Other primary ovarian failure: Secondary | ICD-10-CM

## 2014-04-04 LAB — CBC WITH DIFFERENTIAL/PLATELET
BASOS PCT: 1 % (ref 0–1)
Basophils Absolute: 0.1 10*3/uL (ref 0.0–0.1)
Eosinophils Absolute: 0.2 10*3/uL (ref 0.0–0.7)
Eosinophils Relative: 3 % (ref 0–5)
HEMATOCRIT: 41.5 % (ref 36.0–46.0)
HEMOGLOBIN: 14.3 g/dL (ref 12.0–15.0)
LYMPHS ABS: 1.9 10*3/uL (ref 0.7–4.0)
LYMPHS PCT: 25 % (ref 12–46)
MCH: 32.1 pg (ref 26.0–34.0)
MCHC: 34.5 g/dL (ref 30.0–36.0)
MCV: 93.3 fL (ref 78.0–100.0)
MONO ABS: 0.5 10*3/uL (ref 0.1–1.0)
Monocytes Relative: 7 % (ref 3–12)
NEUTROS ABS: 4.9 10*3/uL (ref 1.7–7.7)
Neutrophils Relative %: 64 % (ref 43–77)
Platelets: 285 10*3/uL (ref 150–400)
RBC: 4.45 MIL/uL (ref 3.87–5.11)
RDW: 13.4 % (ref 11.5–15.5)
WBC: 7.7 10*3/uL (ref 4.0–10.5)

## 2014-04-04 MED ORDER — DEXAMETHASONE SODIUM PHOSPHATE 10 MG/ML IJ SOLN
10.0000 mg | Freq: Once | INTRAMUSCULAR | Status: AC
  Administered 2014-04-04: 10 mg via INTRAMUSCULAR

## 2014-04-04 MED ORDER — HYDROCODONE-ACETAMINOPHEN 5-325 MG PO TABS: 1.0000 | ORAL_TABLET | Freq: Four times a day (QID) | ORAL | Status: DC | PRN

## 2014-04-04 NOTE — Progress Notes (Signed)
Subjective:   Tiffany [name] is a 68 y.o. female who presents for Medicare Annual Wellness Visit and complete physical.    Date of last medicare wellness visit is unknown.  Her blood pressure has been controlled at home, today their BP is BP: 110/78 mmHg She does workout except for the last two month she has had lower back pain with right pain radiation posterior leg to her foot, pain with sitting, moving. She denies chest pain, shortness of breath, dizziness.  She is on cholesterol medication and denies myalgias. Her cholesterol is at goal. The cholesterol last visit was:   Lab Results  Component Value Date   CHOL 188 11/22/2013   HDL 69 11/22/2013   LDLCALC 72 11/22/2013   TRIG 235* 11/22/2013   CHOLHDL 2.7 11/22/2013   Last A1C in the office was:  Lab Results  Component Value Date   HGBA1C 5.3 11/22/2013   Patient is on Vitamin D supplement.   She has bilateral neuropathy, sleeps in recliner due to she can not roll over in the bed due to her back pain. She denies PND, orthopnea.    Names of Other Physician/Practitioners you currently use: 1. [facility]- here for primary care 2. [facility], eye doctor, 2012 3. 1 year ago, unknown who Patient Care Team: Unk Pinto, MD as PCP - General (Internal Medicine)   Medication Review Current Outpatient Prescriptions on File Prior to Visit  Medication Sig Dispense Refill  . ATENOLOL PO Take 100 mg by mouth as directed.       Marland Kitchen buPROPion (WELLBUTRIN SR) 150 MG 12 hr tablet TAKE 1 TABLET BY MOUTH TWICE DAILY  60 tablet  2  . Cholecalciferol (VITAMIN D PO) Take by mouth.      . enalapril (VASOTEC) 20 MG tablet Take 20 mg by mouth daily.      . FUROSEMIDE PO Take 40 mg by mouth daily.       . Multiple Vitamins-Minerals (MULTIVITAMIN PO) Take by mouth.      . pravastatin (PRAVACHOL) 40 MG tablet Take 40 mg by mouth daily.       No current facility-administered medications on file prior to visit.     Current Problems (verified) Patient Active Problem List   Diagnosis Date Noted  . Other and unspecified hyperlipidemia 04/04/2014  . Right foot pain 06/15/2013  . Low back pain 05/30/2013  . Gait disorder 05/16/2013  . Paresthesia 05/16/2013  . High blood pressure     Screening Tests Health Maintenance  Topic Date Due  . Tetanus/tdap  11/21/1967  . Pneumococcal Polysaccharide Vaccine Age 53 And Over  11/20/2013  . Mammogram  03/22/2014  . Influenza Vaccine  07/29/2014  . Colonoscopy  12/29/2016  . Zostavax  Completed    Immunization History  Administered Date(s) Administered  . Influenza Split 11/22/2013  . Pneumococcal Polysaccharide-23 10/01/1999  . Zoster 10/09/2011    Preventative care: Last colonoscopy: Dr. Sharlett Iles OVERDUE- last colonoscopy was 2008 Last mammogram: 02/2012- OVER DUE Last pap smear/pelvic exam: remote DEXA:remote  Prior vaccinations: TD or Tdap: 2009  Influenza: 2014  Pneumococcal: 2000 Shingles/Zostavax: 2012  History reviewed: allergies, current medications, past family history, past medical history, past social history, past surgical history and problem list  Risk Factors: Osteoporosis: postmenopausal estrogen deficiency and dietary calcium and/or vitamin D deficiency History of fracture in the past year: no  Tobacco History  Substance Use Topics  . Smoking status: Current Every Day Smoker    Types:  Cigarettes  . Smokeless tobacco: Never Used  . Alcohol Use: 0.6 oz/week    1 Glasses of wine per week     Comment: daily   She does smoke.   Are there smokers in your home (other than you)?  No  Alcohol Current alcohol use: social drinker  Caffeine Current caffeine use: coffee 1-2 /day  Exercise Current exercise habits: The patient does not participate in regular exercise at present. Due to her back pain and foot she does not exercise regularly  Current exercise: none  Nutrition/Diet Current diet: in general, a  "healthy" diet    Cardiac risk factors: advanced age (older than 89 for men, 48 for women), dyslipidemia, family history of premature cardiovascular disease, hypertension, obesity (BMI >= 30 kg/m2) and sedentary lifestyle.  Depression Screen (Note: if answer to either of the following is "Yes", a more complete depression screening is indicated)   Q1: Over the past two weeks, have you felt down, depressed or hopeless? No  Q2: Over the past two weeks, have you felt little interest or pleasure in doing things? No  Have you lost interest or pleasure in daily life? No  Do you often feel hopeless? No  Do you cry easily over simple problems? No  Activities of Daily Living In your present state of health, do you have any difficulty performing the following activities?:  Driving? No Managing money?  No Feeding yourself? No Getting from bed to chair? Yes Climbing a flight of stairs? Yes Preparing food and eating?: No Bathing or showering? No Getting dressed: No Getting to the toilet? No Using the toilet:No Moving around from place to place: Yes In the past year have you fallen or had a near fall?:No   Are you sexually active?  No  Do you have more than one partner?  No  Vision Difficulties: No  Hearing Difficulties: No Do you often ask people to speak up or repeat themselves? No Do you experience ringing or noises in your ears? No Do you have difficulty understanding soft or whispered voices? No  Cognition  Do you feel that you have a problem with memory?No  Do you often misplace items? No  Do you feel safe at home?  Yes  Advanced directives Does patient have a [place]? No Does patient have a Living Will? No    Objective:     Vision and hearing screens reviewed.   Blood pressure 110/78, pulse 80, temperature 97.7 F (36.5 C), resp. rate 16, height 5\' 5"  (1.651 m), weight 209 lb (94.802 kg). Body mass index is 34.78 kg/(m^2).  General appearance:  alert, no distress, WD/WN,  female Cognitive Testing  Alert? Yes  Normal Appearance?Yes  Oriented to person? Yes  Place? Yes   Time? Yes  Recall of three objects?  Yes  Can perform simple calculations? Yes  Displays appropriate judgment?Yes  Can read the correct time from a watch face?Yes  HEENT: normocephalic, sclerae anicteric, TMs pearly, nares patent, no discharge or erythema, pharynx normal Oral cavity: MMM, no lesions Neck: supple, no lymphadenopathy, no thyromegaly, no masses Heart: RRR, normal S1, S2, no murmurs Lungs:+ wheezes RLL without rhonchi, or rales Abdomen: +bs, soft, obese, non tender, non distended, no masses, no hepatomegaly, no splenomegaly Musculoskeletal: nontender, no swelling, no obvious deformity Extremities: =1+ edema left leg, and 2-3 + edema right leg no cyanosis, no clubbing Pulses: 2+ symmetric, upper extremities, 1+ symmetric lower extremities.  Neurological: alert, oriented x 3, CN2-12 intact, strength  normal upper extremities and lower extremities, sensation decreased throughout bilateral feet to 1/3 up shin, DTRs 2+ throughout, no cerebellar signs, gait normal Psychiatric: normal affect, behavior normal, pleasant  Breast: nontender, no masses or lumps, no skin changes, no nipple discharge or inversion, no axillary lymphadenopathy Gyn: defer   Rectal: defer    Assessment:    1. Other and unspecified hyperlipidemia - Lipid panel  2. High blood pressure - CBC with Differential - BASIC METABOLIC PANEL WITH GFR - Hepatic function panel - TSH - Urinalysis, Routine w reflex microscopic - Microalbumin / creatinine urine ratio - EKG 12-Lead - DG Chest 2 View; Future - [place], RETROPERITNL ABD,  LTD  3. Unspecified vitamin D deficiency  4. Encounter for long-term (current) use of other medications - Magnesium  5. Sciatica of right side has had an ulcer with the pills in the past-, Negative straight leg raise, Norco 5/325, A trigger point injection  was performed at the site of maximal tenderness using 1% plain Lidocaine and Dexamethasone. This was well tolerated, and followed by immediate relief of pain, if it is not better we will send her to ortho - dexamethasone (DECADRON) injection 10 mg; Inject 1 mL (10 mg total) into the muscle once.  6. Estrogen deficiency/smoker - DG Bone Density; Future  7. Smoker Discussed smoking cessation, will get CXR  8. Screening for depression Negative  Colonoscopy MGM  Plan:   During the course of the visit the patient was educated and counseled about appropriate screening and preventive services including:    Influenza vaccine  Td vaccine  Screening electrocardiogram  Screening mammography  Bone densitometry screening  Colorectal cancer screening  Diabetes screening  Glaucoma screening  Nutrition counseling   Smoking cessation counseling  Advanced directives: mailed to patient  Screening recommendations, referrals:  Vaccinations: Tdap vaccine not indicated Influenza vaccine not indicated Pneumococcal vaccine not indicated Shingles vaccine not indicated Hep B vaccine not indicated  Nutrition assessed and recommended  Colonoscopy requested Mammogram requested Pap smear not indicated Pelvic exam not indicated Recommended yearly ophthalmology/optometry visit for glaucoma screening and checkup Recommended yearly dental visit for hygiene and checkup Advanced directives - requested  Conditions/risks identified: BMI: Discussed weight loss, diet, and increase physical activity.  Increase physical activity: AHA recommends 150 minutes of physical activity a week.  Medications reviewed DEXA- ordered Diabetes at goal, ACE/ARB therapy Yes. Urinary Incontinence is an issue: discussed non pharmacology and pharmacology options.  Fall risk: high- discussed PT, home fall assessment, medications.   Medicare Attestation I have personally reviewed: The patient's medical and  social history Their use of alcohol, tobacco or illicit drugs Their current medications and supplements The patient's functional ability including ADLs,fall risks, home safety risks, cognitive, and hearing and visual impairment Diet and physical activities Evidence for depression or mood disorders  The patient's weight, height, BMI, and visual acuity have been recorded in the chart.  I have made referrals, counseling, and provided education to the patient based on review of the above and I have provided the patient with a written personalized care plan for preventive services.     Vicie Mutters, PA-C   04/04/2014

## 2014-04-04 NOTE — Patient Instructions (Signed)
Schedule Mammogram Colonoscopy  Preventative Care for Adults - Female      MAINTAIN REGULAR HEALTH EXAMS:  A routine yearly physical is a good way to check in with your primary care provider about your health and preventive screening. It is also an opportunity to share updates about your health and any concerns you have, and receive a thorough all-over exam.   Most health insurance companies pay for at least some preventative services.  Check with your health plan for specific coverages.  WHAT PREVENTATIVE SERVICES DO WOMEN NEED?  Adult women should have their weight and blood pressure checked regularly.   Women age 30 and older should have their cholesterol levels checked regularly.  Women should be screened for cervical cancer with a Pap smear and pelvic exam beginning at either age 58, or 3 years after they become sexually activity.    Breast cancer screening generally begins at age 37 with a mammogram and breast exam by your primary care provider.    Beginning at age 22 and continuing to age 30, women should be screened for colorectal cancer.  Certain people may need continued testing until age 85.  Updating vaccinations is part of preventative care.  Vaccinations help protect against diseases such as the flu.  Osteoporosis is a disease in which the bones lose minerals and strength as we age. Women ages 44 and over should discuss this with their caregivers, as should women after menopause who have other risk factors.  Lab tests are generally done as part of preventative care to screen for anemia and blood disorders, to screen for problems with the kidneys and liver, to screen for bladder problems, to check blood sugar, and to check your cholesterol level.  Preventative services generally include counseling about diet, exercise, avoiding tobacco, drugs, excessive alcohol consumption, and sexually transmitted infections.    GENERAL RECOMMENDATIONS FOR GOOD HEALTH:  Healthy  diet:  Eat a variety of foods, including fruit, vegetables, animal or vegetable protein, such as meat, fish, chicken, and eggs, or beans, lentils, tofu, and grains, such as rice.  Drink plenty of water daily.  Decrease saturated fat in the diet, avoid lots of red meat, processed foods, sweets, fast foods, and fried foods.  Exercise:  Aerobic exercise helps maintain good heart health. At least 30-40 minutes of moderate-intensity exercise is recommended. For example, a brisk walk that increases your heart rate and breathing. This should be done on most days of the week.   Find a type of exercise or a variety of exercises that you enjoy so that it becomes a part of your daily life.  Examples are running, walking, swimming, water aerobics, and biking.  For motivation and support, explore group exercise such as aerobic class, spin class, Zumba, Yoga,or  martial arts, etc.    Set exercise goals for yourself, such as a certain weight goal, walk or run in a race such as a 5k walk/run.  Speak to your primary care provider about exercise goals.  Disease prevention:  If you smoke or chew tobacco, find out from your caregiver how to quit. It can literally save your life, no matter how long you have been a tobacco user. If you do not use tobacco, never begin.   Maintain a healthy diet and normal weight. Increased weight leads to problems with blood pressure and diabetes.   The Body Mass Index or BMI is a way of measuring how much of your body is fat. Having a BMI above 27 increases the  risk of heart disease, diabetes, hypertension, stroke and other problems related to obesity. Your caregiver can help determine your BMI and based on it develop an exercise and dietary program to help you achieve or maintain this important measurement at a healthful level.  High blood pressure causes heart and blood vessel problems.  Persistent high blood pressure should be treated with medicine if weight loss and exercise  do not work.   Fat and cholesterol leaves deposits in your arteries that can block them. This causes heart disease and vessel disease elsewhere in your body.  If your cholesterol is found to be high, or if you have heart disease or certain other medical conditions, then you may need to have your cholesterol monitored frequently and be treated with medication.   Ask if you should have a cardiac stress test if your history suggests this. A stress test is a test done on a treadmill that looks for heart disease. This test can find disease prior to there being a problem.  Menopause can be associated with physical symptoms and risks. Hormone replacement therapy is available to decrease these. You should talk to your caregiver about whether starting or continuing to take hormones is right for you.   Osteoporosis is a disease in which the bones lose minerals and strength as we age. This can result in serious bone fractures. Risk of osteoporosis can be identified using a bone density scan. Women ages 23 and over should discuss this with their caregivers, as should women after menopause who have other risk factors. Ask your caregiver whether you should be taking a calcium supplement and Vitamin D, to reduce the rate of osteoporosis.   Avoid drinking alcohol in excess (more than two drinks per day).  Avoid use of street drugs. Do not share needles with anyone. Ask for professional help if you need assistance or instructions on stopping the use of alcohol, cigarettes, and/or drugs.  Brush your teeth twice a day with fluoride toothpaste, and floss once a day. Good oral hygiene prevents tooth decay and gum disease. The problems can be painful, unattractive, and can cause other health problems. Visit your dentist for a routine oral and dental check up and preventive care every 6-12 months.   Look at your skin regularly.  Use a mirror to look at your back. Notify your caregivers of changes in moles, especially if  there are changes in shapes, colors, a size larger than a pencil eraser, an irregular border, or development of new moles.  Safety:  Use seatbelts 100% of the time, whether driving or as a passenger.  Use safety devices such as hearing protection if you work in environments with loud noise or significant background noise.  Use safety glasses when doing any work that could send debris in to the eyes.  Use a helmet if you ride a bike or motorcycle.  Use appropriate safety gear for contact sports.  Talk to your caregiver about gun safety.  Use sunscreen with a SPF (or skin protection factor) of 15 or greater.  Lighter skinned people are at a greater risk of skin cancer. Don't forget to also wear sunglasses in order to protect your eyes from too much damaging sunlight. Damaging sunlight can accelerate cataract formation.   Practice safe sex. Use condoms. Condoms are used for birth control and to help reduce the spread of sexually transmitted infections (or STIs).  Some of the STIs are gonorrhea (the clap), chlamydia, syphilis, trichomonas, herpes, HPV (human papilloma virus) and  HIV (human immunodeficiency virus) which causes AIDS. The herpes, HIV and HPV are viral illnesses that have no cure. These can result in disability, cancer and death.   Keep carbon monoxide and smoke detectors in your home functioning at all times. Change the batteries every 6 months or use a model that plugs into the wall.   Vaccinations:  Stay up to date with your tetanus shots and other required immunizations. You should have a booster for tetanus every 10 years. Be sure to get your flu shot every year, since 5%-20% of the [place] population comes down with the flu. The flu vaccine changes each year, so being vaccinated once is not enough. Get your shot in the fall, before the flu season peaks.   Other vaccines to consider:  Human Papilloma Virus or HPV causes cancer of the cervix, and other infections that can be transmitted  from person to person. There is a vaccine for HPV, and females should get immunized between the ages of 40 and 41. It requires a series of 3 shots.   Pneumococcal vaccine to protect against certain types of pneumonia.  This is normally recommended for adults age 23 or older.  However, adults younger than 66 years old with certain underlying conditions such as diabetes, heart or lung disease should also receive the vaccine.  Shingles vaccine to protect against Varicella Zoster if you are older than age 82, or younger than 66 years old with certain underlying illness.  Hepatitis A vaccine to protect against a form of infection of the liver by a virus acquired from food.  Hepatitis B vaccine to protect against a form of infection of the liver by a virus acquired from blood or body fluids, particularly if you work in health care.  If you plan to travel internationally, check with your local health department for specific vaccination recommendations.  Cancer Screening:  Breast cancer screening is essential to preventive care for women. All women age 63 and older should perform a breast self-exam every month. At age 80 and older, women should have their caregiver complete a breast exam each year. Women at ages 39 and older should have a mammogram (x-ray film) of the breasts. Your caregiver can discuss how often you need mammograms.    Cervical cancer screening includes taking a Pap smear (sample of cells examined under a microscope) from the cervix (end of the uterus). It also includes testing for HPV (Human Papilloma Virus, which can cause cervical cancer). Screening and a pelvic exam should begin at age 76, or 3 years after a woman becomes sexually active. Screening should occur every year, with a Pap smear but no HPV testing, up to age 55. After age 56, you should have a Pap smear every 3 years with HPV testing, if no HPV was found previously.   Most routine colon cancer screening begins at the age of  22. On a yearly basis, doctors may provide special easy to use take-home tests to check for hidden blood in the stool. Sigmoidoscopy or colonoscopy can detect the earliest forms of colon cancer and is life saving. These tests use a small camera at the end of a tube to directly examine the colon. Speak to your caregiver about this at age 6, when routine screening begins (and is repeated every 5 years unless early forms of pre-cancerous polyps or small growths are found).

## 2014-04-05 LAB — URINALYSIS, ROUTINE W REFLEX MICROSCOPIC
Bilirubin Urine: NEGATIVE
GLUCOSE, UA: NEGATIVE mg/dL
Hgb urine dipstick: NEGATIVE
Ketones, ur: NEGATIVE mg/dL
Nitrite: NEGATIVE
PH: 7 (ref 5.0–8.0)
Protein, ur: NEGATIVE mg/dL
Specific Gravity, Urine: 1.005 (ref 1.005–1.030)
Urobilinogen, UA: 0.2 mg/dL (ref 0.0–1.0)

## 2014-04-05 LAB — HEPATIC FUNCTION PANEL
ALT: 20 U/L (ref 0–35)
AST: 21 U/L (ref 0–37)
Albumin: 4.2 g/dL (ref 3.5–5.2)
Alkaline Phosphatase: 89 U/L (ref 39–117)
BILIRUBIN DIRECT: 0.1 mg/dL (ref 0.0–0.3)
Indirect Bilirubin: 0.4 mg/dL (ref 0.2–1.2)
Total Bilirubin: 0.5 mg/dL (ref 0.2–1.2)
Total Protein: 6.7 g/dL (ref 6.0–8.3)

## 2014-04-05 LAB — BASIC METABOLIC PANEL WITH GFR
BUN: 19 mg/dL (ref 6–23)
CO2: 26 mEq/L (ref 19–32)
Calcium: 9.6 mg/dL (ref 8.4–10.5)
Chloride: 95 mEq/L — ABNORMAL LOW (ref 96–112)
Creat: 1.2 mg/dL — ABNORMAL HIGH (ref 0.50–1.10)
GFR, EST AFRICAN AMERICAN: 55 mL/min — AB
GFR, EST NON AFRICAN AMERICAN: 48 mL/min — AB
GLUCOSE: 87 mg/dL (ref 70–99)
Potassium: 4.7 mEq/L (ref 3.5–5.3)
SODIUM: 130 meq/L — AB (ref 135–145)

## 2014-04-05 LAB — URINALYSIS, MICROSCOPIC ONLY
Bacteria, UA: NONE SEEN
Casts: NONE SEEN
Crystals: NONE SEEN
SQUAMOUS EPITHELIAL / LPF: NONE SEEN

## 2014-04-05 LAB — TSH: TSH: 0.222 u[IU]/mL — ABNORMAL LOW (ref 0.350–4.500)

## 2014-04-05 LAB — MAGNESIUM: Magnesium: 1.8 mg/dL (ref 1.5–2.5)

## 2014-04-05 LAB — LIPID PANEL
CHOLESTEROL: 187 mg/dL (ref 0–200)
HDL: 76 mg/dL (ref >39)
LDL Cholesterol: 81 mg/dL (ref 0–99)
TRIGLYCERIDES: 151 mg/dL — AB (ref <150)
Total CHOL/HDL Ratio: 2.5 Ratio
VLDL: 30 mg/dL (ref 0–40)

## 2014-04-05 LAB — MICROALBUMIN / CREATININE URINE RATIO
Creatinine, Urine: 27.6 mg/dL
MICROALB/CREAT RATIO: 18.1 mg/g (ref 0.0–30.0)
Microalb, Ur: 0.5 mg/dL (ref 0.00–1.89)

## 2014-04-15 ENCOUNTER — Other Ambulatory Visit: Payer: Self-pay | Admitting: Physician Assistant

## 2014-04-17 ENCOUNTER — Encounter: Payer: Self-pay | Admitting: Physician Assistant

## 2014-04-17 ENCOUNTER — Ambulatory Visit (INDEPENDENT_AMBULATORY_CARE_PROVIDER_SITE_OTHER): Payer: Medicare Other | Admitting: Physician Assistant

## 2014-04-17 VITALS — BP 132/82 | HR 76 | Temp 97.9°F | Resp 16 | Wt 203.0 lb

## 2014-04-17 DIAGNOSIS — I10 Essential (primary) hypertension: Secondary | ICD-10-CM

## 2014-04-17 DIAGNOSIS — F17200 Nicotine dependence, unspecified, uncomplicated: Secondary | ICD-10-CM

## 2014-04-17 DIAGNOSIS — E871 Hypo-osmolality and hyponatremia: Secondary | ICD-10-CM | POA: Diagnosis not present

## 2014-04-17 DIAGNOSIS — M5431 Sciatica, right side: Secondary | ICD-10-CM

## 2014-04-17 DIAGNOSIS — M5430 Sciatica, unspecified side: Secondary | ICD-10-CM | POA: Diagnosis not present

## 2014-04-17 LAB — BASIC METABOLIC PANEL WITH GFR
BUN: 18 mg/dL (ref 6–23)
CHLORIDE: 97 meq/L (ref 96–112)
CO2: 29 meq/L (ref 19–32)
Calcium: 9.6 mg/dL (ref 8.4–10.5)
Creat: 1.59 mg/dL — ABNORMAL HIGH (ref 0.50–1.10)
GFR, Est African American: 39 mL/min — ABNORMAL LOW
GFR, Est Non African American: 34 mL/min — ABNORMAL LOW
GLUCOSE: 99 mg/dL (ref 70–99)
Potassium: 4.6 mEq/L (ref 3.5–5.3)
Sodium: 135 mEq/L (ref 135–145)

## 2014-04-17 MED ORDER — HYDROCODONE-ACETAMINOPHEN 5-325 MG PO TABS
1.0000 | ORAL_TABLET | Freq: Four times a day (QID) | ORAL | Status: DC | PRN
Start: 2014-04-17 — End: 2014-05-30

## 2014-04-17 NOTE — Patient Instructions (Signed)
Hyponatremia   Hyponatremia is when the amount of salt (sodium) in your blood is too low. When sodium levels are low, your cells will absorb extra water and swell. The swelling happens throughout the body, but it mostly affects the brain. Severe brain swelling (cerebral edema), seizures, or coma can happen.   CAUSES    Heart, kidney, or liver problems.   Thyroid problems.   Adrenal gland problems.   Severe vomiting and diarrhea.   Certain medicines or illegal drugs.   Dehydration.   Drinking too much water.   Low-sodium diet.  SYMPTOMS    Nausea and vomiting.   Confusion.   Lethargy.   Agitation.   Headache.   Twitching or shaking (seizures).   Unconsciousness.   Appetite loss.   Muscle weakness and cramping.  DIAGNOSIS   Hyponatremia is identified by a simple blood test. Your caregiver will perform a history and physical exam to try to find the cause and type of hyponatremia. Other tests may be needed to measure the amount of sodium in your blood and urine.  TREATMENT   Treatment will depend on the cause.    Fluids may be given through the vein (IV).   Medicines may be used to correct the sodium imbalance. If medicines are causing the problem, they will need to be adjusted.   Water or fluid intake may be restricted to restore proper balance.  The speed of correcting the sodium problem is very important. If the problem is corrected too fast, nerve damage (sometimes unchangeable) can happen.  HOME CARE INSTRUCTIONS    Only take medicines as directed by your caregiver. Many medicines can make hyponatremia worse. Discuss all your medicines with your caregiver.   Carefully follow any recommended diet, including any fluid restrictions.   You may be asked to repeat lab tests. Follow these directions.   Avoid alcohol and recreational drugs.  SEEK MEDICAL CARE IF:    You develop worsening nausea, fatigue, headache, confusion, or weakness.   Your original hyponatremia symptoms return.   You have  problems following the recommended diet.  SEEK IMMEDIATE MEDICAL CARE IF:    You have a seizure.   You faint.   You have ongoing diarrhea or vomiting.  MAKE SURE YOU:    Understand these instructions.   Will watch your condition.   Will get help right away if you are not doing well or get worse.  Document Released: 12/05/2002 Document Revised: 03/08/2012 Document Reviewed: 06/01/2011  [employer] Patient Information 2014 [employer]

## 2014-04-17 NOTE — Progress Notes (Signed)
   Subjective:    Patient ID: Tiffany [name], female    DOB: 04-Apr-1946, 68 y.o.   MRN: [id number]  HPI 68 y.o. presents for a follow up. She was having right sciatica pain, the injection and norco is helping and she wishes to not see an orothpedic doctor at this time. She is taking Norco 3 a day. Last time her sodium has gone from 135 to 130, she was suppose get a CXR due to her being a smoker but has not done this yet. We will recheck her sodium. Denies muscle cramping, fatigue, confusion, dizziness.   Review of Systems  Constitutional: Negative.   HENT: Negative.   Respiratory: Negative.   Cardiovascular: Negative.   Gastrointestinal: Negative.   Genitourinary: Negative.   Musculoskeletal: Positive for arthralgias, back pain and gait problem. Negative for joint swelling, myalgias, neck pain and neck stiffness.  Neurological: Negative.   Psychiatric/Behavioral: Negative.        Objective:   Physical Exam  Constitutional: She is oriented to person, place, and time. She appears well-developed and well-nourished.  HENT:  Head: Normocephalic and atraumatic.  Right Ear: External ear normal.  Left Ear: External ear normal.  Mouth/Throat: Oropharynx is clear and moist.  Eyes: Conjunctivae and EOM are normal. Pupils are equal, round, and reactive to light.  Neck: Normal range of motion. Neck supple. No thyromegaly present.  Cardiovascular: Normal rate, regular rhythm and normal heart sounds.  Exam reveals no gallop and no friction rub.   No murmur heard. Pulmonary/Chest: Effort normal and breath sounds normal. No respiratory distress. She has no wheezes.  Abdominal: Soft. Bowel sounds are normal. She exhibits no distension and no mass. There is no tenderness. There is no rebound and no guarding.  Musculoskeletal: Normal range of motion.  Lymphadenopathy:    She has no cervical adenopathy.  Neurological: She is alert and oriented to person, place, and time. She displays normal reflexes.  No cranial nerve deficit. Gait abnormal. Coordination normal.  Skin: Skin is warm and dry.  Psychiatric: She has a normal mood and affect.      Assessment & Plan:  Hyponatremia- check kidney function Get CXR  Back pain- if not better follow up with Ortho.

## 2014-04-25 ENCOUNTER — Other Ambulatory Visit: Payer: Self-pay | Admitting: Internal Medicine

## 2014-05-17 ENCOUNTER — Ambulatory Visit (INDEPENDENT_AMBULATORY_CARE_PROVIDER_SITE_OTHER): Payer: Medicare Other | Admitting: *Deleted

## 2014-05-17 DIAGNOSIS — R6889 Other general symptoms and signs: Secondary | ICD-10-CM | POA: Diagnosis not present

## 2014-05-17 LAB — BASIC METABOLIC PANEL WITH GFR
BUN: 18 mg/dL (ref 6–23)
CHLORIDE: 99 meq/L (ref 96–112)
CO2: 25 meq/L (ref 19–32)
CREATININE: 1.39 mg/dL — AB (ref 0.50–1.10)
Calcium: 9.4 mg/dL (ref 8.4–10.5)
GFR, Est African American: 46 mL/min — ABNORMAL LOW
GFR, Est Non African American: 40 mL/min — ABNORMAL LOW
Glucose, Bld: 90 mg/dL (ref 70–99)
Potassium: 5 mEq/L (ref 3.5–5.3)
Sodium: 132 mEq/L — ABNORMAL LOW (ref 135–145)

## 2014-05-18 ENCOUNTER — Ambulatory Visit: Payer: Self-pay

## 2014-05-30 ENCOUNTER — Telehealth: Payer: Self-pay

## 2014-05-30 ENCOUNTER — Other Ambulatory Visit: Payer: Self-pay | Admitting: Physician Assistant

## 2014-05-30 MED ORDER — HYDROCODONE-ACETAMINOPHEN 5-325 MG PO TABS: 1.0000 | ORAL_TABLET | Freq: Four times a day (QID) | ORAL | Status: DC | PRN

## 2014-05-30 NOTE — Telephone Encounter (Signed)
Received paper note from front office staff. Patient requesting pain medication. Per Vicie Mutters, PA called patient and advised her that this RX  Dated today for Hydrocodone 5-325 mg, #90, zero refill would be last prescription she would give for pain meds, patient was advised that if she has not improved to call office and advise and we would refer to Ortho or to pain management. Patient verbalized understanding.

## 2014-07-18 ENCOUNTER — Ambulatory Visit (INDEPENDENT_AMBULATORY_CARE_PROVIDER_SITE_OTHER): Payer: Medicare Other | Admitting: Internal Medicine

## 2014-07-18 ENCOUNTER — Encounter: Payer: Self-pay | Admitting: Internal Medicine

## 2014-07-18 VITALS — BP 122/80 | HR 64 | Temp 97.1°F | Resp 16 | Ht 65.0 in | Wt 210.8 lb

## 2014-07-18 DIAGNOSIS — E559 Vitamin D deficiency, unspecified: Secondary | ICD-10-CM | POA: Insufficient documentation

## 2014-07-18 DIAGNOSIS — E782 Mixed hyperlipidemia: Secondary | ICD-10-CM

## 2014-07-18 DIAGNOSIS — R7309 Other abnormal glucose: Secondary | ICD-10-CM | POA: Insufficient documentation

## 2014-07-18 DIAGNOSIS — Z79899 Other long term (current) drug therapy: Secondary | ICD-10-CM | POA: Diagnosis not present

## 2014-07-18 DIAGNOSIS — I10 Essential (primary) hypertension: Secondary | ICD-10-CM

## 2014-07-18 LAB — CBC WITH DIFFERENTIAL/PLATELET
BASOS ABS: 0.1 10*3/uL (ref 0.0–0.1)
Basophils Relative: 1 % (ref 0–1)
EOS PCT: 2 % (ref 0–5)
Eosinophils Absolute: 0.2 10*3/uL (ref 0.0–0.7)
HEMATOCRIT: 41.8 % (ref 36.0–46.0)
Hemoglobin: 14.3 g/dL (ref 12.0–15.0)
LYMPHS PCT: 32 % (ref 12–46)
Lymphs Abs: 2.6 10*3/uL (ref 0.7–4.0)
MCH: 32.8 pg (ref 26.0–34.0)
MCHC: 34.2 g/dL (ref 30.0–36.0)
MCV: 95.9 fL (ref 78.0–100.0)
MONO ABS: 0.7 10*3/uL (ref 0.1–1.0)
Monocytes Relative: 9 % (ref 3–12)
Neutro Abs: 4.6 10*3/uL (ref 1.7–7.7)
Neutrophils Relative %: 56 % (ref 43–77)
Platelets: 265 10*3/uL (ref 150–400)
RBC: 4.36 MIL/uL (ref 3.87–5.11)
RDW: 13.5 % (ref 11.5–15.5)
WBC: 8.2 10*3/uL (ref 4.0–10.5)

## 2014-07-18 MED ORDER — TRAMADOL HCL 50 MG PO TABS: ORAL_TABLET | ORAL | Status: DC

## 2014-07-18 NOTE — Progress Notes (Signed)
Patient ID: Tiffany [name], female   DOB: 1946-10-30, 68 y.o.   MRN: [id number]   This very nice 68 y.o.MWF presents for 3 month follow up with Hypertension, Hyperlipidemia, Pre-Diabetes and Vitamin D Deficiency. Patient has apparently developed a Charcot's joint of the left ankle and walks with a cane. She also reports ongoing pain in the neck and back . She has tried to taper off of Norco.   HTN predates since 1998. BP has been controlled at home. Today's BP: 122/80 mmHg. Patient denies any cardiac type chest pain, palpitations, dyspnea/orthopnea/PND, dizziness, claudication, or dependent edema.   Hyperlipidemia is controlled with diet & meds. Patient denies myalgias or other med SE's. Last Lipids were Lab Results  Component Value Date   CHOL 187 04/04/2014   HDL 76 04/04/2014   LDLCALC 81 04/04/2014   TRIG 151* 04/04/2014   CHOLHDL 2.5 04/04/2014    Also, the patient has Morbid Obesity (BI 35) and coMorbid risk factors and is screened for  PreDiabetes      and last A1c was 5.3% in Nov 2014. Patient denies any symptoms of reactive hypoglycemia, diabetic polys, paresthesias or visual blurring.   Further, Patient has history of Vitamin D Deficiency of 41 in 2009 and last vitamin D was 39 in Aug 2014. Patient supplements vitamin D without any suspected side-effects.   Medication List   atenolol 100 MG tablet  Commonly known as:  TENORMIN  TAKE 1 TABLET BY MOUTH DAILY FOR BLOOD PRESSURE     buPROPion 150 MG 12 hr tablet  Commonly known as:  WELLBUTRIN SR  TAKE 1 TABLET BY MOUTH TWICE DAILY     enalapril 20 MG tablet  Commonly known as:  VASOTEC  Take 20 mg by mouth daily.     FUROSEMIDE PO  Take 40 mg by mouth daily.     loratadine 10 MG tablet  Commonly known as:  CLARITIN  Take 10 mg by mouth daily.     MULTIVITAMIN PO  Take by mouth.     pravastatin 40 MG tablet  Commonly known as:  PRAVACHOL  Take 40 mg by mouth daily.     traMADol 50 MG tablet  Commonly known as:  ULTRAM  Take  1/2 to 1 tablet 3 x day if needed for severe pain     VITAMIN D PO  Take 5,000 Units by mouth 2 (two) times daily.         No Active Allergies  PMHx:   Past Medical History  Diagnosis Date  . High blood pressure   . Gait difficulty   . Balance disorder   . Hyperlipidemia   . Pre-diabetes   . Vitamin D deficiency   . Obesity (BMI 35.0-39.9 without comorbidity)   . Neuropathy   . Hyperthyroidism mild    FHx:    Reviewed / unchanged SHx:    Reviewed / unchanged  Systems Review:  Constitutional: Denies fever, chills, wt changes, headaches, insomnia, fatigue, night sweats, change in appetite. Eyes: Denies redness, blurred vision, diplopia, discharge, itchy, watery eyes.  ENT: Denies discharge, congestion, post nasal drip, epistaxis, sore throat, earache, hearing loss, dental pain, tinnitus, vertigo, sinus pain, snoring.  CV: Denies chest pain, palpitations, irregular heartbeat, syncope, dyspnea, diaphoresis, orthopnea, PND, claudication or edema. Respiratory: denies cough, dyspnea, DOE, pleurisy, hoarseness, laryngitis, wheezing.  Gastrointestinal: Denies dysphagia, odynophagia, heartburn, reflux, water brash, abdominal pain or cramps, nausea, vomiting, bloating, diarrhea, constipation, hematemesis, melena, hematochezia  or hemorrhoids. Genitourinary: Denies dysuria, frequency, urgency,  nocturia, hesitancy, discharge, hematuria or flank pain. Musculoskeletal: Denies arthralgias, myalgias, stiffness, jt. swelling, pain, limping or strain/sprain.  Skin: Denies pruritus, rash, hives, warts, acne, eczema or change in skin lesion(s). Neuro: No weakness, tremor, incoordination, spasms, paresthesia or pain. Psychiatric: Denies confusion, memory loss or sensory loss. Endo: Denies change in weight, skin or hair change.  Heme/Lymph: No excessive bleeding, bruising or enlarged lymph nodes.  Exam:  BP 122/80  Pulse 64  Temp(Src) 97.1 F (36.2 C) (Temporal)  Resp 16  Ht 5\' 5"  (1.651 m)   Wt 210 lb 12.8 oz (95.618 kg)  BMI 35.08 kg/m2  Appears well nourished and in no distress. Eyes: PERRLA, EOMs, conjunctiva no swelling or erythema. Sinuses: No frontal/maxillary tenderness ENT/Mouth: EAC's clear, TM's nl w/o erythema, bulging. Nares clear w/o erythema, swelling, exudates. Oropharynx clear without erythema or exudates. Oral hygiene is good. Tongue normal, non obstructing. Hearing intact.  Neck: Supple. Thyroid nl. Car 2+/2+ without bruits, nodes or JVD. Chest: Respirations nl with BS clear & equal w/o rales, rhonchi, wheezing or stridor.  Cor: Heart sounds normal w/ regular rate and rhythm without sig. murmurs, gallops, clicks, or rubs. Peripheral pulses normal and equal  without edema.  Abdomen: Soft & bowel sounds normal. Non-tender w/o guarding, rebound, hernias, masses, or organomegaly.  Lymphatics: Unremarkable.  Musculoskeletal: Full ROM all peripheral extremities, joint stability, 5/5 strength, and limping gait using a cane on the right side..  Skin: Warm, dry without exposed rashes, lesions or ecchymosis apparent.  Neuro: Cranial nerves intact, reflexes equal bilaterally. Sensory-motor testing grossly intact. Tendon reflexes grossly intact.  Pysch: Alert & oriented x 3. Insight and judgement nl & appropriate. No ideations.  Assessment and Plan:  1. Hypertension - Continue monitor blood pressure at home. Continue diet/meds same.  2. Hyperlipidemia - Continue diet/meds, exercise,& lifestyle modifications. Continue monitor periodic cholesterol/liver & renal functions   3. Morbid Obesity (BMI 35)  4. Pre-Diabetes, Screening - Continue diet, exercise, lifestyle modifications. Monitor appropriate labs.   5. CKD, Stage 3 (GFR 40 ml/min)  6.  Vitamin D Deficiency - Continue supplementation.  7. DDD/Chronic Pain Syndrome - Given Rx-Tramadol & advised her if she needs ongoing pain rx will refer to chronic pain clinic.  Recommended regular exercise, BP monitoring,  weight control, and discussed med and SE's. Recommended labs to assess and monitor clinical status. Further disposition pending results of labs.

## 2014-07-18 NOTE — Patient Instructions (Signed)
   Recommend the book "The END of DIETING" by Dr Baker Janus   and the book "The END of DIABETES " by Dr Excell Seltzer  At [email or web address] - get book & Audio CD's      Being diabetic  Or preDiabetic has a  300% increased risk for heart attack, stroke, cancer, and alzheimer- type vascular dementia. It is very important that you work harder with diet by avoiding all foods that are white except chicken & fish. Avoid white rice (brown & wild rice is OK), white potatoes (sweetpotatoes in moderation is OK), White bread or wheat bread or anything made out of white flour like bagels, donuts, rolls, buns, biscuits, cakes, pastries, cookies, pizza crust, and pasta (made from white flour & egg whites) - vegetarian pasta or spinach or wheat pasta is OK. Multigrain breads like Arnold's or Pepperidge Farm, or multigrain sandwich thins or flatbreads.  Diet, exercise and weight loss can reverse and cure diabetes in the early stages.  Diet, exercise and weight loss is very important in the control and prevention of complications of diabetes which affects every system in your body, ie. Brain - dementia/stroke, eyes - glaucoma/blindness, heart - heart attack/heart failure, kidneys - dialysis, stomach - gastric paralysis, intestines - malabsorption, nerves - severe painful neuritis, circulation - gangrene & loss of a leg(s), and finally cancer and Alzheimers.    I recommend avoid fried & greasy foods,  sweets/candy, white rice (brown or wild rice or Quinoa is OK), white potatoes (sweet potatoes are OK) - anything made from white flour - bagels, doughnuts, rolls, buns, biscuits,white and wheat breads, pizza crust and traditional pasta made of white flour & egg white(vegetarian pasta or spinach or wheat pasta is OK).  Multi-grain bread is OK - like multi-grain flat bread or sandwich thins. Avoid alcohol in excess. Exercise is also important.    Eat all the vegetables you want - avoid meat, especially red meat and dairy - especially  cheese.  Cheese is the most concentrated form of trans-fats which is the worst thing to clog up our arteries. Veggie cheese is OK which can be found in the fresh produce section at [facility] or [employer] or [employer]

## 2014-07-19 LAB — MAGNESIUM: MAGNESIUM: 1.7 mg/dL (ref 1.5–2.5)

## 2014-07-19 LAB — BASIC METABOLIC PANEL WITH GFR
BUN: 16 mg/dL (ref 6–23)
CHLORIDE: 94 meq/L — AB (ref 96–112)
CO2: 26 mEq/L (ref 19–32)
CREATININE: 1.22 mg/dL — AB (ref 0.50–1.10)
Calcium: 9.3 mg/dL (ref 8.4–10.5)
GFR, Est African American: 54 mL/min — ABNORMAL LOW
GFR, Est Non African American: 47 mL/min — ABNORMAL LOW
GLUCOSE: 81 mg/dL (ref 70–99)
Potassium: 4.6 mEq/L (ref 3.5–5.3)
Sodium: 131 mEq/L — ABNORMAL LOW (ref 135–145)

## 2014-07-19 LAB — TSH: TSH: 0.093 u[IU]/mL — ABNORMAL LOW (ref 0.350–4.500)

## 2014-07-19 LAB — HEMOGLOBIN A1C
Hgb A1c MFr Bld: 5.2 % (ref <5.7)
Mean Plasma Glucose: 103 mg/dL (ref <117)

## 2014-07-19 LAB — LIPID PANEL
Cholesterol: 178 mg/dL (ref 0–200)
HDL: 75 mg/dL (ref >39)
LDL Cholesterol: 70 mg/dL (ref 0–99)
TRIGLYCERIDES: 167 mg/dL — AB (ref <150)
Total CHOL/HDL Ratio: 2.4 Ratio
VLDL: 33 mg/dL (ref 0–40)

## 2014-07-19 LAB — HEPATIC FUNCTION PANEL
ALBUMIN: 3.9 g/dL (ref 3.5–5.2)
ALT: 17 U/L (ref 0–35)
AST: 17 U/L (ref 0–37)
Alkaline Phosphatase: 103 U/L (ref 39–117)
BILIRUBIN TOTAL: 0.5 mg/dL (ref 0.2–1.2)
Bilirubin, Direct: 0.1 mg/dL (ref 0.0–0.3)
Indirect Bilirubin: 0.4 mg/dL (ref 0.2–1.2)
Total Protein: 6.9 g/dL (ref 6.0–8.3)

## 2014-07-19 LAB — INSULIN, FASTING: INSULIN FASTING, SERUM: 22 u[IU]/mL (ref 3–28)

## 2014-07-19 LAB — VITAMIN D 25 HYDROXY (VIT D DEFICIENCY, FRACTURES): VIT D 25 HYDROXY: 96 ng/mL — AB (ref 30–89)

## 2014-07-24 ENCOUNTER — Other Ambulatory Visit: Payer: Self-pay | Admitting: Internal Medicine

## 2014-08-17 ENCOUNTER — Other Ambulatory Visit: Payer: Self-pay | Admitting: Physician Assistant

## 2014-10-19 ENCOUNTER — Other Ambulatory Visit: Payer: Self-pay | Admitting: Internal Medicine

## 2014-10-19 DIAGNOSIS — Z1231 Encounter for screening mammogram for malignant neoplasm of breast: Secondary | ICD-10-CM

## 2014-10-25 ENCOUNTER — Ambulatory Visit: Payer: Self-pay | Admitting: Physician Assistant

## 2014-10-31 ENCOUNTER — Ambulatory Visit (HOSPITAL_COMMUNITY)
Admission: RE | Admit: 2014-10-31 | Discharge: 2014-10-31 | Disposition: A | Discharge status: Home / Self Care | Payer: Medicare Other | Source: Ambulatory Visit | Attending: Internal Medicine | Admitting: Internal Medicine

## 2014-10-31 ENCOUNTER — Ambulatory Visit (HOSPITAL_COMMUNITY)
Admission: RE | Admit: 2014-10-31 | Discharge: 2014-10-31 | Disposition: A | Discharge status: Home / Self Care | Payer: Medicare Other | Source: Ambulatory Visit | Attending: Physician Assistant | Admitting: Physician Assistant

## 2014-10-31 DIAGNOSIS — F17200 Nicotine dependence, unspecified, uncomplicated: Secondary | ICD-10-CM | POA: Insufficient documentation

## 2014-10-31 DIAGNOSIS — Z780 Asymptomatic menopausal state: Secondary | ICD-10-CM | POA: Diagnosis not present

## 2014-10-31 DIAGNOSIS — M810 Age-related osteoporosis without current pathological fracture: Secondary | ICD-10-CM | POA: Diagnosis not present

## 2014-10-31 DIAGNOSIS — Z1231 Encounter for screening mammogram for malignant neoplasm of breast: Secondary | ICD-10-CM | POA: Insufficient documentation

## 2014-10-31 DIAGNOSIS — I10 Essential (primary) hypertension: Secondary | ICD-10-CM

## 2014-10-31 DIAGNOSIS — E2839 Other primary ovarian failure: Secondary | ICD-10-CM

## 2014-10-31 DIAGNOSIS — Z720 Tobacco use: Secondary | ICD-10-CM | POA: Diagnosis not present

## 2014-10-31 DIAGNOSIS — Z13820 Encounter for screening for osteoporosis: Secondary | ICD-10-CM | POA: Diagnosis not present

## 2014-11-02 ENCOUNTER — Other Ambulatory Visit: Payer: Self-pay | Admitting: Physician Assistant

## 2014-11-02 ENCOUNTER — Other Ambulatory Visit: Payer: Self-pay

## 2014-11-02 DIAGNOSIS — R05 Cough: Secondary | ICD-10-CM

## 2014-11-02 DIAGNOSIS — R059 Cough, unspecified: Secondary | ICD-10-CM

## 2014-11-02 MED ORDER — AZITHROMYCIN 250 MG PO TABS: ORAL_TABLET | ORAL | Status: DC

## 2014-11-02 MED ORDER — ALENDRONATE SODIUM 70 MG PO TABS: 70.0000 mg | ORAL_TABLET | ORAL | Status: DC

## 2014-11-09 ENCOUNTER — Other Ambulatory Visit: Payer: Self-pay | Admitting: Internal Medicine

## 2014-11-20 ENCOUNTER — Ambulatory Visit: Payer: Self-pay | Admitting: Physician Assistant

## 2014-12-05 ENCOUNTER — Telehealth: Payer: Self-pay

## 2014-12-05 DIAGNOSIS — M5430 Sciatica, unspecified side: Secondary | ICD-10-CM

## 2014-12-05 NOTE — Telephone Encounter (Signed)
Patient called to inform Estill Bamberg that the Fosamax was making her sick and she cannot take it, per Vicie Mutters, PA she advised to stop taking and they could discuss at next visit, patient verbalized understanding, patient also states that she wanted a referral placed for Pain Management, states that she was told at previous visit that we could refer her to [facility] for Sciatica pain, referral placed today

## 2014-12-13 DIAGNOSIS — G894 Chronic pain syndrome: Secondary | ICD-10-CM | POA: Diagnosis not present

## 2014-12-13 DIAGNOSIS — M79609 Pain in unspecified limb: Secondary | ICD-10-CM | POA: Diagnosis not present

## 2014-12-13 DIAGNOSIS — Z79899 Other long term (current) drug therapy: Secondary | ICD-10-CM | POA: Diagnosis not present

## 2014-12-13 DIAGNOSIS — Z79891 Long term (current) use of opiate analgesic: Secondary | ICD-10-CM | POA: Diagnosis not present

## 2014-12-13 DIAGNOSIS — M545 Low back pain: Secondary | ICD-10-CM | POA: Diagnosis not present

## 2014-12-27 ENCOUNTER — Other Ambulatory Visit: Payer: Self-pay | Admitting: Emergency Medicine

## 2014-12-27 ENCOUNTER — Other Ambulatory Visit: Payer: Self-pay | Admitting: Internal Medicine

## 2014-12-27 DIAGNOSIS — Z79891 Long term (current) use of opiate analgesic: Secondary | ICD-10-CM | POA: Diagnosis not present

## 2014-12-27 DIAGNOSIS — G894 Chronic pain syndrome: Secondary | ICD-10-CM | POA: Diagnosis not present

## 2014-12-27 DIAGNOSIS — M5137 Other intervertebral disc degeneration, lumbosacral region: Secondary | ICD-10-CM | POA: Diagnosis not present

## 2014-12-27 DIAGNOSIS — Z79899 Other long term (current) drug therapy: Secondary | ICD-10-CM | POA: Diagnosis not present

## 2015-01-04 DIAGNOSIS — R200 Anesthesia of skin: Secondary | ICD-10-CM | POA: Diagnosis not present

## 2015-01-04 DIAGNOSIS — M545 Low back pain: Secondary | ICD-10-CM | POA: Diagnosis not present

## 2015-01-16 ENCOUNTER — Encounter: Payer: Self-pay | Admitting: Physician Assistant

## 2015-01-23 DIAGNOSIS — M79606 Pain in leg, unspecified: Secondary | ICD-10-CM | POA: Diagnosis not present

## 2015-01-23 DIAGNOSIS — M545 Low back pain: Secondary | ICD-10-CM | POA: Diagnosis not present

## 2015-01-25 ENCOUNTER — Ambulatory Visit (INDEPENDENT_AMBULATORY_CARE_PROVIDER_SITE_OTHER): Payer: Medicare Other | Admitting: Internal Medicine

## 2015-01-25 ENCOUNTER — Encounter: Payer: Self-pay | Admitting: Internal Medicine

## 2015-01-25 VITALS — BP 138/84 | HR 64 | Temp 97.4°F | Resp 16 | Ht 65.0 in | Wt 205.2 lb

## 2015-01-25 DIAGNOSIS — E782 Mixed hyperlipidemia: Secondary | ICD-10-CM

## 2015-01-25 DIAGNOSIS — E559 Vitamin D deficiency, unspecified: Secondary | ICD-10-CM

## 2015-01-25 DIAGNOSIS — R7309 Other abnormal glucose: Secondary | ICD-10-CM | POA: Diagnosis not present

## 2015-01-25 DIAGNOSIS — Z79899 Other long term (current) drug therapy: Secondary | ICD-10-CM | POA: Diagnosis not present

## 2015-01-25 DIAGNOSIS — M545 Low back pain: Secondary | ICD-10-CM

## 2015-01-25 DIAGNOSIS — I10 Essential (primary) hypertension: Secondary | ICD-10-CM | POA: Diagnosis not present

## 2015-01-25 NOTE — Progress Notes (Signed)
Patient ID: Tiffany [name], female   DOB: 1946/01/30, 69 y.o.   MRN: [id number]   This very nice 69 y.o. MWF presents for 3 month follow up with Hypertension, Hyperlipidemia, Pre-Diabetes and Vitamin D Deficiency.    Patient has been  treated for HTN (1998) & BP has been controlled at home. Today's BP: 138/84 mmHg. Patient has had no complaints of any cardiac type chest pain, palpitations, dyspnea/orthopnea/PND, dizziness, claudication, or dependent edema.   Hyperlipidemia is controlled with diet & meds. Patient denies myalgias or other med SE's. Last Lipids were Total  Chol 178; HDL  75; LDL 70; Trig 167 on 07/18/2014.   Also, the patient has history of PreDiabetes and has had no symptoms of reactive hypoglycemia, diabetic polys, paresthesias or visual blurring.  Last A1c was  5.2% on 07/18/2014.    Further, the patient also has history of Vitamin D Deficiency pof 41 in 2009 and supplements vitamin D without any suspected side-effects. Last vitamin D was 96 on 07/18/2014.  Medication Sig  . atenolol (TENORMIN) 100 MG tablet TAKE 1 TABLET BY MOUTH DAILY   . buPROPion-SR 150 MG 12 hr tablet TAKE 1 TABLET BY MOUTH TWICE DAILY  . Cholecalciferol  Take 5,000 Units by mouth times daily.   . diclofenac 75 MG EC tablet TAKE 1 TABLET BY MOUTH TWICE DAILY  . enalapril  20 MG tablet TAKE 1 TABLET BY MOUTH DAILY  . FUROSEMIDE PO Take  daily.   Marland Kitchen loratadine  10 MG tablet Take  daily.  . Multiple Vitamins-Minerals Take by mouth.  . pravastatin (PRAVACHOL) 40 MG tablet TAKE 1 TAB EVERY NIGHT AT BEDTIME   . furosemide (LASIX) 40 MG tablet TAKE 1 TABLET BY MOUTH EVERY MORNING  . traMADol (ULTRAM) 50 MG tablet Take 1/2 to 1 tablet 3 x day if needed for severe pain   No Active Allergies  PMHx:   Past Medical History  Diagnosis Date  . High blood pressure   . Gait difficulty   . Balance disorder   . Hyperlipidemia   . Pre-diabetes   . Vitamin D deficiency   . Obesity (BMI 35.0-39.9 without comorbidity)    . Neuropathy   . Hyperthyroidism mild   Immunization History  Administered Date(s) Administered  . Influenza Split 11/22/2013  . Pneumococcal Polysaccharide-23 10/01/1999  . Zoster 10/09/2011   Past Surgical History  Procedure Laterality Date  . Vaginal hysterectomy    . Cervical disc arthroplasty    . Rotator cuff repair    . Spinal fusion    . Tonsillectomy and adenoidectomy     FHx:    Reviewed / unchanged  SHx:    Reviewed / unchanged  Systems Review:  Constitutional: Denies fever, chills, wt changes, headaches, insomnia, fatigue, night sweats, change in appetite. Eyes: Denies redness, blurred vision, diplopia, discharge, itchy, watery eyes.  ENT: Denies discharge, congestion, post nasal drip, epistaxis, sore throat, earache, hearing loss, dental pain, tinnitus, vertigo, sinus pain, snoring.  CV: Denies chest pain, palpitations, irregular heartbeat, syncope, dyspnea, diaphoresis, orthopnea, PND, claudication or edema. Respiratory: denies cough, dyspnea, DOE, pleurisy, hoarseness, laryngitis, wheezing.  Gastrointestinal: Denies dysphagia, odynophagia, heartburn, reflux, water brash, abdominal pain or cramps, nausea, vomiting, bloating, diarrhea, constipation, hematemesis, melena, hematochezia  or hemorrhoids. Genitourinary: Denies dysuria, frequency, urgency, nocturia, hesitancy, discharge, hematuria or flank pain. Musculoskeletal: Denies arthralgias, myalgias, stiffness, jt. swelling, pain, limping or strain/sprain.  Skin: Denies pruritus, rash, hives, warts, acne, eczema or change in skin lesion(s). Neuro: No weakness, tremor,  incoordination, spasms, paresthesia or pain. Psychiatric: Denies confusion, memory loss or sensory loss. Endo: Denies change in weight, skin or hair change.  Heme/Lymph: No excessive bleeding, bruising or enlarged lymph nodes.  Physical Exam  BP 138/84   Pulse 64  Temp 97.4 F   Resp 16  Ht 5\' 5"    Wt 205 lb     BMI 34.15   Appears well  nourished and in no distress. Eyes: PERRLA, EOMs, conjunctiva no swelling or erythema. Sinuses: No frontal/maxillary tenderness ENT/Mouth: EAC's clear, TM's nl w/o erythema, bulging. Nares clear w/o erythema, swelling, exudates. Oropharynx clear without erythema or exudates. Oral hygiene is good. Tongue normal, non obstructing. Hearing intact.  Neck: Supple. Thyroid nl. Car 2+/2+ without bruits, nodes or JVD. Chest: Respirations nl with BS clear & equal w/o rales, rhonchi, wheezing or stridor.  Cor: Heart sounds normal w/ regular rate and rhythm without sig. murmurs, gallops, clicks, or rubs. Peripheral pulses normal and equal  without edema.  Abdomen: Soft & bowel sounds normal. Non-tender w/o guarding, rebound, hernias, masses, or organomegaly.  Lymphatics: Unremarkable.  Musculoskeletal: Full ROM all peripheral extremities, joint stability, 5/5 strength, and normal gait.  Skin: Warm, dry without exposed rashes, lesions or ecchymosis apparent.  Neuro: Cranial nerves intact, reflexes equal bilaterally. Sensory-motor testing grossly intact. Tendon reflexes grossly intact.  Pysch: Alert & oriented x 3.  Insight and judgement nl & appropriate. No ideations.  Assessment and Plan:  1. Hypertension - Continue monitor blood pressure at home. Continue diet/meds same.  2. Hyperlipidemia - Continue diet/meds, exercise,& lifestyle modifications. Continue monitor periodic cholesterol/liver & renal functions   3. Pre-Diabetes - Continue diet, exercise, lifestyle modifications. Monitor appropriate labs.  4. Vitamin D Deficiency - Continue supplementation.   Recommended regular exercise, BP monitoring, weight control, and discussed med and SE's. Recommended labs to assess and monitor clinical status. Further disposition pending results of labs.

## 2015-01-25 NOTE — Patient Instructions (Signed)
   Recommend the book "The END of DIETING" by Dr Excell Seltzer   & the book "The END of DIABETES " by Dr Excell Seltzer  At [email or web address] - get book & Audio CD's      Being diabetic has a  300% increased risk for heart attack, stroke, cancer, and alzheimer- type vascular dementia. It is very important that you work harder with diet by avoiding all foods that are white except chicken & fish. Avoid white rice (brown & wild rice is OK), white potatoes (sweetpotatoes in moderation is OK), White bread or wheat bread or anything made out of white flour like bagels, donuts, rolls, buns, biscuits, cakes, pastries, cookies, pizza crust, and pasta (made from white flour & egg whites) - vegetarian pasta or spinach or wheat pasta is OK. Multigrain breads like Arnold's or Pepperidge Farm, or multigrain sandwich thins or flatbreads.  Diet, exercise and weight loss can reverse and cure diabetes in the early stages.  Diet, exercise and weight loss is very important in the control and prevention of complications of diabetes which affects every system in your body, ie. Brain - dementia/stroke, eyes - glaucoma/blindness, heart - heart attack/heart failure, kidneys - dialysis, stomach - gastric paralysis, intestines - malabsorption, nerves - severe painful neuritis, circulation - gangrene & loss of a leg(s), and finally cancer and Alzheimers.    I recommend avoid fried & greasy foods,  sweets/candy, white rice (brown or wild rice or Quinoa is OK), white potatoes (sweet potatoes are OK) - anything made from white flour - bagels, doughnuts, rolls, buns, biscuits,white and wheat breads, pizza crust and traditional pasta made of white flour & egg white(vegetarian pasta or spinach or wheat pasta is OK).  Multi-grain bread is OK - like multi-grain flat bread or sandwich thins. Avoid alcohol in excess. Exercise is also important.    Eat all the vegetables you want - avoid meat, especially red meat and dairy - especially cheese.  Cheese  is the most concentrated form of trans-fats which is the worst thing to clog up our arteries. Veggie cheese is OK which can be found in the fresh produce section at [facility] or [employer] or [employer]

## 2015-01-26 ENCOUNTER — Other Ambulatory Visit: Payer: Self-pay | Admitting: Internal Medicine

## 2015-01-26 LAB — CBC WITH DIFFERENTIAL/PLATELET
BASOS ABS: 0 10*3/uL (ref 0.0–0.1)
BASOS PCT: 0 % (ref 0–1)
Eosinophils Absolute: 0 10*3/uL (ref 0.0–0.7)
Eosinophils Relative: 0 % (ref 0–5)
HCT: 43.2 % (ref 36.0–46.0)
Hemoglobin: 14.6 g/dL (ref 12.0–15.0)
LYMPHS ABS: 1.7 10*3/uL (ref 0.7–4.0)
LYMPHS PCT: 15 % (ref 12–46)
MCH: 31.1 pg (ref 26.0–34.0)
MCHC: 33.8 g/dL (ref 30.0–36.0)
MCV: 92.1 fL (ref 78.0–100.0)
MPV: 9 fL (ref 8.6–12.4)
Monocytes Absolute: 0.7 10*3/uL (ref 0.1–1.0)
Monocytes Relative: 6 % (ref 3–12)
NEUTROS ABS: 8.7 10*3/uL — AB (ref 1.7–7.7)
NEUTROS PCT: 79 % — AB (ref 43–77)
PLATELETS: 334 10*3/uL (ref 150–400)
RBC: 4.69 MIL/uL (ref 3.87–5.11)
RDW: 13.2 % (ref 11.5–15.5)
WBC: 11 10*3/uL — AB (ref 4.0–10.5)

## 2015-01-26 LAB — HEPATIC FUNCTION PANEL
ALK PHOS: 89 U/L (ref 39–117)
ALT: 15 U/L (ref 0–35)
AST: 15 U/L (ref 0–37)
Albumin: 3.9 g/dL (ref 3.5–5.2)
BILIRUBIN DIRECT: 0.1 mg/dL (ref 0.0–0.3)
Indirect Bilirubin: 0.4 mg/dL (ref 0.2–1.2)
TOTAL PROTEIN: 6.8 g/dL (ref 6.0–8.3)
Total Bilirubin: 0.5 mg/dL (ref 0.2–1.2)

## 2015-01-26 LAB — BASIC METABOLIC PANEL WITH GFR
BUN: 18 mg/dL (ref 6–23)
CALCIUM: 9.6 mg/dL (ref 8.4–10.5)
CHLORIDE: 94 meq/L — AB (ref 96–112)
CO2: 26 mEq/L (ref 19–32)
CREATININE: 1.04 mg/dL (ref 0.50–1.10)
GFR, Est African American: 65 mL/min
GFR, Est Non African American: 56 mL/min — ABNORMAL LOW
GLUCOSE: 89 mg/dL (ref 70–99)
POTASSIUM: 4.5 meq/L (ref 3.5–5.3)
Sodium: 132 mEq/L — ABNORMAL LOW (ref 135–145)

## 2015-01-26 LAB — LIPID PANEL
Cholesterol: 198 mg/dL (ref 0–200)
HDL: 89 mg/dL (ref >39)
LDL CALC: 83 mg/dL (ref 0–99)
TRIGLYCERIDES: 128 mg/dL (ref <150)
Total CHOL/HDL Ratio: 2.2 Ratio
VLDL: 26 mg/dL (ref 0–40)

## 2015-01-26 LAB — HEMOGLOBIN A1C
Hgb A1c MFr Bld: 5.4 % (ref <5.7)
MEAN PLASMA GLUCOSE: 108 mg/dL (ref <117)

## 2015-01-26 LAB — MAGNESIUM: Magnesium: 1.9 mg/dL (ref 1.5–2.5)

## 2015-01-26 LAB — INSULIN, FASTING: INSULIN FASTING, SERUM: 19.3 u[IU]/mL (ref 2.0–19.6)

## 2015-01-26 LAB — VITAMIN D 25 HYDROXY (VIT D DEFICIENCY, FRACTURES): Vit D, 25-Hydroxy: 72 ng/mL (ref 30–100)

## 2015-01-26 LAB — TSH: TSH: 0.045 u[IU]/mL — ABNORMAL LOW (ref 0.350–4.500)

## 2015-02-08 ENCOUNTER — Other Ambulatory Visit: Payer: Self-pay | Admitting: Internal Medicine

## 2015-02-27 ENCOUNTER — Encounter (INDEPENDENT_AMBULATORY_CARE_PROVIDER_SITE_OTHER): Payer: Self-pay

## 2015-02-27 ENCOUNTER — Ambulatory Visit (HOSPITAL_COMMUNITY)
Admission: RE | Admit: 2015-02-27 | Discharge: 2015-02-27 | Disposition: A | Discharge status: Home / Self Care | Payer: Medicare Other | Source: Ambulatory Visit | Attending: Physician Assistant | Admitting: Physician Assistant

## 2015-02-27 DIAGNOSIS — R059 Cough, unspecified: Secondary | ICD-10-CM

## 2015-02-27 DIAGNOSIS — R05 Cough: Secondary | ICD-10-CM | POA: Insufficient documentation

## 2015-02-28 ENCOUNTER — Other Ambulatory Visit: Payer: Self-pay | Admitting: Physician Assistant

## 2015-02-28 ENCOUNTER — Other Ambulatory Visit: Payer: Self-pay

## 2015-02-28 DIAGNOSIS — M25559 Pain in unspecified hip: Secondary | ICD-10-CM | POA: Diagnosis not present

## 2015-02-28 DIAGNOSIS — R05 Cough: Secondary | ICD-10-CM

## 2015-02-28 DIAGNOSIS — R059 Cough, unspecified: Secondary | ICD-10-CM

## 2015-02-28 DIAGNOSIS — M545 Low back pain: Secondary | ICD-10-CM | POA: Diagnosis not present

## 2015-02-28 DIAGNOSIS — Z79899 Other long term (current) drug therapy: Secondary | ICD-10-CM | POA: Diagnosis not present

## 2015-02-28 DIAGNOSIS — F17200 Nicotine dependence, unspecified, uncomplicated: Secondary | ICD-10-CM

## 2015-02-28 DIAGNOSIS — E871 Hypo-osmolality and hyponatremia: Secondary | ICD-10-CM

## 2015-02-28 DIAGNOSIS — G894 Chronic pain syndrome: Secondary | ICD-10-CM | POA: Diagnosis not present

## 2015-02-28 DIAGNOSIS — M79606 Pain in leg, unspecified: Secondary | ICD-10-CM | POA: Diagnosis not present

## 2015-02-28 MED ORDER — AZITHROMYCIN 250 MG PO TABS: ORAL_TABLET | ORAL | Status: AC

## 2015-03-27 ENCOUNTER — Other Ambulatory Visit: Payer: Self-pay

## 2015-03-27 ENCOUNTER — Other Ambulatory Visit: Payer: Self-pay | Admitting: Physician Assistant

## 2015-03-27 ENCOUNTER — Ambulatory Visit
Admission: RE | Admit: 2015-03-27 | Discharge: 2015-03-27 | Disposition: A | Discharge status: Home / Self Care | Payer: Medicare Other | Source: Ambulatory Visit | Attending: Physician Assistant | Admitting: Physician Assistant

## 2015-03-27 DIAGNOSIS — R05 Cough: Secondary | ICD-10-CM

## 2015-03-27 DIAGNOSIS — E042 Nontoxic multinodular goiter: Secondary | ICD-10-CM | POA: Diagnosis not present

## 2015-03-27 DIAGNOSIS — R918 Other nonspecific abnormal finding of lung field: Secondary | ICD-10-CM | POA: Diagnosis not present

## 2015-03-27 DIAGNOSIS — R059 Cough, unspecified: Secondary | ICD-10-CM

## 2015-03-27 DIAGNOSIS — Z01812 Encounter for preprocedural laboratory examination: Secondary | ICD-10-CM | POA: Diagnosis not present

## 2015-03-27 DIAGNOSIS — F17200 Nicotine dependence, unspecified, uncomplicated: Secondary | ICD-10-CM

## 2015-03-27 DIAGNOSIS — E871 Hypo-osmolality and hyponatremia: Secondary | ICD-10-CM | POA: Diagnosis not present

## 2015-03-27 LAB — CREATININE, SERUM: Creat: 1.2 mg/dL — ABNORMAL HIGH (ref 0.50–1.10)

## 2015-03-27 LAB — BUN: BUN: 17 mg/dL (ref 6–23)

## 2015-03-27 MED ORDER — IOPAMIDOL (ISOVUE-300) INJECTION 61%
75.0000 mL | Freq: Once | INTRAVENOUS | Status: AC | PRN
  Administered 2015-03-27: 75 mL via INTRAVENOUS

## 2015-03-28 ENCOUNTER — Other Ambulatory Visit: Payer: Self-pay | Admitting: Physician Assistant

## 2015-03-28 DIAGNOSIS — R918 Other nonspecific abnormal finding of lung field: Secondary | ICD-10-CM

## 2015-03-28 DIAGNOSIS — E871 Hypo-osmolality and hyponatremia: Secondary | ICD-10-CM

## 2015-03-28 DIAGNOSIS — F17200 Nicotine dependence, unspecified, uncomplicated: Secondary | ICD-10-CM

## 2015-03-29 ENCOUNTER — Telehealth: Payer: Self-pay

## 2015-03-29 ENCOUNTER — Other Ambulatory Visit: Payer: Self-pay

## 2015-03-29 ENCOUNTER — Other Ambulatory Visit: Payer: Self-pay | Admitting: Internal Medicine

## 2015-03-29 MED ORDER — AZITHROMYCIN 250 MG PO TABS: ORAL_TABLET | ORAL | Status: DC

## 2015-03-29 NOTE — Telephone Encounter (Signed)
Patient is aware of CT chest results and instructions, during call she also states that she has had a cough and congestion for several weeks. Per Vicie Mutters, PA sent in patient a zpak and will await results of her scheduled PET scan.

## 2015-04-03 ENCOUNTER — Ambulatory Visit (HOSPITAL_COMMUNITY)
Admission: RE | Admit: 2015-04-03 | Discharge: 2015-04-03 | Disposition: A | Discharge status: Home / Self Care | Payer: Medicare Other | Source: Ambulatory Visit | Attending: Physician Assistant | Admitting: Physician Assistant

## 2015-04-03 DIAGNOSIS — E871 Hypo-osmolality and hyponatremia: Secondary | ICD-10-CM | POA: Insufficient documentation

## 2015-04-03 DIAGNOSIS — Z720 Tobacco use: Secondary | ICD-10-CM | POA: Insufficient documentation

## 2015-04-03 DIAGNOSIS — R918 Other nonspecific abnormal finding of lung field: Secondary | ICD-10-CM | POA: Insufficient documentation

## 2015-04-03 DIAGNOSIS — F17200 Nicotine dependence, unspecified, uncomplicated: Secondary | ICD-10-CM

## 2015-04-03 LAB — GLUCOSE, CAPILLARY: Glucose-Capillary: 94 mg/dL (ref 70–99)

## 2015-04-03 MED ORDER — FLUDEOXYGLUCOSE F - 18 (FDG) INJECTION
10.2000 | Freq: Once | INTRAVENOUS | Status: AC | PRN
  Administered 2015-04-03: 10.2 via INTRAVENOUS

## 2015-04-04 ENCOUNTER — Other Ambulatory Visit: Payer: Self-pay | Admitting: Internal Medicine

## 2015-04-04 ENCOUNTER — Telehealth: Payer: Self-pay | Admitting: Adult Health

## 2015-04-04 DIAGNOSIS — R911 Solitary pulmonary nodule: Secondary | ICD-10-CM

## 2015-04-04 NOTE — Telephone Encounter (Signed)
Atc, office closed for lunch.  wcb.

## 2015-04-04 NOTE — Telephone Encounter (Signed)
Called again office closes at 4.  Wcb.

## 2015-04-05 NOTE — Telephone Encounter (Signed)
Spoke with Katrina at Dr [facility] office and given consult appt for pt with Dr Gwenette Greet 04/06/15 at 9:45.

## 2015-04-06 ENCOUNTER — Encounter: Payer: Self-pay | Admitting: Pulmonary Disease

## 2015-04-06 ENCOUNTER — Ambulatory Visit (INDEPENDENT_AMBULATORY_CARE_PROVIDER_SITE_OTHER): Payer: Medicare Other | Admitting: Pulmonary Disease

## 2015-04-06 VITALS — BP 124/70 | HR 74 | Temp 97.0°F | Ht 62.0 in | Wt 207.2 lb

## 2015-04-06 DIAGNOSIS — R911 Solitary pulmonary nodule: Secondary | ICD-10-CM | POA: Diagnosis not present

## 2015-04-06 NOTE — Progress Notes (Signed)
   Subjective:    Patient ID: Tiffany [name], female    DOB: 07-Nov-1946, 69 y.o.   MRN: [id number]  HPI The patient is a 69 year old female who I've been asked to see for management of a pulmonary nodule. She recently had a chest x-ray as part of her yearly checkup, and was noted to have nodular densities in the left upper lobe. This was followed by a CT chest which verified the presence of abnormal densities that appear to last into 1 area, but no other significant abnormality. She then underwent a PET scan where she was found to have significant uptake in the left upper lobe process, and questionable abnormal uptake in a left hilar node. The patient has a long history of smoking, and unfortunately continues to do so. She has an ongoing chronic cough with mucus production, but has not had any hemoptysis. She denies chest discomfort, has been eating well, and has not been losing weight. She has no personal history of cancer, but does have an enlarged thyroid consistent with a multinodular goiter on current imaging. She has never lived in the [place] or [place], and has no history of TB exposure.   Review of Systems  Constitutional: Negative for fever and unexpected weight change.  HENT: Positive for congestion. Negative for dental problem, ear pain, nosebleeds, postnasal drip, rhinorrhea, sinus pressure, sneezing, sore throat and trouble swallowing.   Eyes: Negative for redness and itching.  Respiratory: Negative for cough, chest tightness, shortness of breath and wheezing.   Cardiovascular: Negative for palpitations and leg swelling.  Gastrointestinal: Negative for nausea and vomiting.  Genitourinary: Negative for dysuria.  Musculoskeletal: Negative for joint swelling.  Skin: Negative for rash.  Neurological: Negative for headaches.  Hematological: Does not bruise/bleed easily.  Psychiatric/Behavioral: Negative for dysphoric mood. The patient is not nervous/anxious.          Objective:   Physical Exam Constitutional:  Obese female, no acute distress  HENT:  Nares patent without discharge  Oropharynx without exudate, palate and uvula are normal  Eyes:  Perrla, eomi, no scleral icterus  Neck:  No JVD, no TMG  Cardiovascular:  Normal rate, regular rhythm, no rubs or gallops.  No murmurs        Intact distal pulses but diminished  Pulmonary :  Normal breath sounds, no stridor or respiratory distress   No rales, rhonchi, or wheezing  Abdominal:  Soft, nondistended, bowel sounds present.  No tenderness noted.   Musculoskeletal:  1+ lower extremity edema noted.  Lymph Nodes:  No cervical lymphadenopathy noted  Skin:  No cyanosis noted  Neurologic:  Alert, appropriate, moves all 4 extremities without obvious deficit.         Assessment & Plan:

## 2015-04-06 NOTE — Patient Instructions (Signed)
Will arrange for navigational bronchoscopy for diagnosis of your abnormal lung spots.  Will call you with the time.

## 2015-04-06 NOTE — Assessment & Plan Note (Signed)
The patient has abnormal nodular densities in her left upper lobe which are positive on PET scanning. He is also a question of mild uptake in a left hilar node that is not significantly enlarged on CT chest. The patient has a long history of smoking, and is at significant risk for bronchogenic cancer. It is unclear whether this is a malignancy or an inflammatory process, but I think that she will need a biopsy in order to determine this. I have discussed with her transthoracic needle aspiration as well as navigational bronchoscopy, and I feel that bronchoscopy is her safest and best procedure at this time. We could also consider doing endobronchial ultrasound to stage the mediastinum at the same time. The patient understands this is associated with general anesthesia, and is agreeable to proceeding.

## 2015-04-08 ENCOUNTER — Other Ambulatory Visit: Payer: Self-pay | Admitting: Physician Assistant

## 2015-04-09 ENCOUNTER — Encounter: Payer: Self-pay | Admitting: Physician Assistant

## 2015-04-10 ENCOUNTER — Other Ambulatory Visit: Payer: Self-pay | Admitting: Emergency Medicine

## 2015-04-10 DIAGNOSIS — R918 Other nonspecific abnormal finding of lung field: Secondary | ICD-10-CM

## 2015-04-16 DIAGNOSIS — M545 Low back pain: Secondary | ICD-10-CM | POA: Diagnosis not present

## 2015-04-16 DIAGNOSIS — M79606 Pain in leg, unspecified: Secondary | ICD-10-CM | POA: Diagnosis not present

## 2015-04-16 DIAGNOSIS — G894 Chronic pain syndrome: Secondary | ICD-10-CM | POA: Diagnosis not present

## 2015-04-16 DIAGNOSIS — Z79899 Other long term (current) drug therapy: Secondary | ICD-10-CM | POA: Diagnosis not present

## 2015-04-16 DIAGNOSIS — M25559 Pain in unspecified hip: Secondary | ICD-10-CM | POA: Diagnosis not present

## 2015-04-17 ENCOUNTER — Encounter (HOSPITAL_COMMUNITY)
Admission: RE | Admit: 2015-04-17 | Discharge: 2015-04-17 | Disposition: A | Discharge status: Home / Self Care | Payer: Medicare Other | Source: Ambulatory Visit | Attending: Emergency Medicine | Admitting: Emergency Medicine

## 2015-04-17 ENCOUNTER — Telehealth: Payer: Self-pay | Admitting: Emergency Medicine

## 2015-04-17 ENCOUNTER — Encounter (HOSPITAL_COMMUNITY): Payer: Self-pay

## 2015-04-17 DIAGNOSIS — E039 Hypothyroidism, unspecified: Secondary | ICD-10-CM | POA: Diagnosis not present

## 2015-04-17 DIAGNOSIS — F17210 Nicotine dependence, cigarettes, uncomplicated: Secondary | ICD-10-CM | POA: Diagnosis not present

## 2015-04-17 DIAGNOSIS — E559 Vitamin D deficiency, unspecified: Secondary | ICD-10-CM | POA: Diagnosis not present

## 2015-04-17 DIAGNOSIS — R590 Localized enlarged lymph nodes: Secondary | ICD-10-CM | POA: Diagnosis not present

## 2015-04-17 DIAGNOSIS — E785 Hyperlipidemia, unspecified: Secondary | ICD-10-CM | POA: Diagnosis not present

## 2015-04-17 DIAGNOSIS — Z6837 Body mass index (BMI) 37.0-37.9, adult: Secondary | ICD-10-CM | POA: Diagnosis not present

## 2015-04-17 DIAGNOSIS — Z90710 Acquired absence of both cervix and uterus: Secondary | ICD-10-CM | POA: Diagnosis not present

## 2015-04-17 DIAGNOSIS — R911 Solitary pulmonary nodule: Secondary | ICD-10-CM | POA: Diagnosis not present

## 2015-04-17 DIAGNOSIS — I10 Essential (primary) hypertension: Secondary | ICD-10-CM | POA: Diagnosis not present

## 2015-04-17 HISTORY — DX: Unspecified osteoarthritis, unspecified site: M19.90

## 2015-04-17 LAB — BASIC METABOLIC PANEL
ANION GAP: 10 (ref 5–15)
BUN: 13 mg/dL (ref 6–23)
CHLORIDE: 98 mmol/L (ref 96–112)
CO2: 26 mmol/L (ref 19–32)
Calcium: 9.2 mg/dL (ref 8.4–10.5)
Creatinine, Ser: 1.15 mg/dL — ABNORMAL HIGH (ref 0.50–1.10)
GFR calc Af Amer: 56 mL/min — ABNORMAL LOW (ref >90)
GFR, EST NON AFRICAN AMERICAN: 48 mL/min — AB (ref >90)
Glucose, Bld: 100 mg/dL — ABNORMAL HIGH (ref 70–99)
POTASSIUM: 4.4 mmol/L (ref 3.5–5.1)
Sodium: 134 mmol/L — ABNORMAL LOW (ref 135–145)

## 2015-04-17 LAB — CBC
HCT: 41.9 % (ref 36.0–46.0)
Hemoglobin: 13.7 g/dL (ref 12.0–15.0)
MCH: 32.9 pg (ref 26.0–34.0)
MCHC: 32.7 g/dL (ref 30.0–36.0)
MCV: 100.7 fL — ABNORMAL HIGH (ref 78.0–100.0)
Platelets: 356 10*3/uL (ref 150–400)
RBC: 4.16 MIL/uL (ref 3.87–5.11)
RDW: 15.5 % (ref 11.5–15.5)
WBC: 10 10*3/uL (ref 4.0–10.5)

## 2015-04-17 NOTE — Telephone Encounter (Signed)
Thank you :)

## 2015-04-17 NOTE — Pre-Procedure Instructions (Signed)
Tiffany [name]  04/17/2015   Your procedure is scheduled on:  Wednesday, April 20  Report to [facility] Main Entrance "A" at 8:15 AM.  Call this number if you have problems the morning of surgery: [phone]   Remember:   Do not eat food or drink liquids after midnight.   Take these medicines the morning of surgery with A SIP OF WATER: atenolol, hydrocodone   Do not wear jewelry, make-up or nail polish.  Do not wear lotions, powders, or perfumes. You may wear deodorant.  Do not shave 48 hours prior to surgery. Men may shave face and neck.  Do not bring valuables to the hospital.  [facility] is not responsible                  for any belongings or valuables.               Contacts, dentures or bridgework may not be worn into surgery.  Leave suitcase in the car. After surgery it may be brought to your room.  For patients admitted to the hospital, discharge time is determined by your                treatment team.               Patients discharged the day of surgery will not be allowed to drive  home.  Name and phone number of your driver: spouse  Special Instructions: review preparing for surgery handout   Please read over the following fact sheets that you were given: Pain Booklet and Coughing and Deep Breathing

## 2015-04-17 NOTE — Progress Notes (Signed)
Called Dr. Lamonte Sakai office for orders this am at 0900.

## 2015-04-17 NOTE — Telephone Encounter (Signed)
RB in the office all day  Will forward this to him to make him aware

## 2015-04-17 NOTE — Anesthesia Preprocedure Evaluation (Addendum)
Anesthesia Evaluation  Patient identified by MRN, date of birth, ID band Patient awake    Reviewed: Allergy & Precautions, NPO status , Patient's Chart, lab work & pertinent test results, reviewed documented beta blocker date and time   History of Anesthesia Complications Negative for: history of anesthetic complications  Airway Mallampati: II  TM Distance: <3 FB Neck ROM: Full    Dental  (+) Caps, Dental Advisory Given, Teeth Intact   Pulmonary Current Smoker,  breath sounds clear to auscultation        Cardiovascular hypertension, Pt. on medications and Pt. on home beta blockers Rhythm:Regular Rate:Normal     Neuro/Psych negative neurological ROS  negative psych ROS   GI/Hepatic negative GI ROS, Neg liver ROS,   Endo/Other  Morbid obesity  Renal/GU negative Renal ROS     Musculoskeletal   Abdominal   Peds  Hematology   Anesthesia Other Findings   Reproductive/Obstetrics negative OB ROS                           Anesthesia Physical Anesthesia Plan  ASA: III  Anesthesia Plan: General   Post-op Pain Management:    Induction: Intravenous  Airway Management Planned: Oral ETT  Additional Equipment:   Intra-op Plan:   Post-operative Plan:   Informed Consent: I have reviewed the patients History and Physical, chart, labs and discussed the procedure including the risks, benefits and alternatives for the proposed anesthesia with the patient or authorized representative who has indicated his/her understanding and acceptance.   Dental advisory given  Plan Discussed with: CRNA and Anesthesiologist  Anesthesia Plan Comments:         Anesthesia Quick Evaluation

## 2015-04-18 ENCOUNTER — Ambulatory Visit (HOSPITAL_COMMUNITY): Payer: Medicare Other

## 2015-04-18 ENCOUNTER — Ambulatory Visit (HOSPITAL_COMMUNITY): Payer: Medicare Other | Admitting: Anesthesiology

## 2015-04-18 ENCOUNTER — Ambulatory Visit (HOSPITAL_COMMUNITY)
Admission: RE | Admit: 2015-04-18 | Discharge: 2015-04-18 | Disposition: A | Discharge status: Home / Self Care | Payer: Medicare Other | Source: Ambulatory Visit | Attending: Emergency Medicine | Admitting: Emergency Medicine

## 2015-04-18 ENCOUNTER — Encounter (HOSPITAL_COMMUNITY)
Admission: RE | Disposition: A | Discharge status: Home / Self Care | Payer: Self-pay | Source: Ambulatory Visit | Attending: Emergency Medicine

## 2015-04-18 ENCOUNTER — Encounter (HOSPITAL_COMMUNITY): Payer: Self-pay | Admitting: *Deleted

## 2015-04-18 DIAGNOSIS — Z9889 Other specified postprocedural states: Secondary | ICD-10-CM | POA: Diagnosis not present

## 2015-04-18 DIAGNOSIS — R911 Solitary pulmonary nodule: Secondary | ICD-10-CM | POA: Insufficient documentation

## 2015-04-18 DIAGNOSIS — Z90710 Acquired absence of both cervix and uterus: Secondary | ICD-10-CM | POA: Insufficient documentation

## 2015-04-18 DIAGNOSIS — R590 Localized enlarged lymph nodes: Secondary | ICD-10-CM | POA: Insufficient documentation

## 2015-04-18 DIAGNOSIS — Z6837 Body mass index (BMI) 37.0-37.9, adult: Secondary | ICD-10-CM | POA: Diagnosis not present

## 2015-04-18 DIAGNOSIS — E559 Vitamin D deficiency, unspecified: Secondary | ICD-10-CM | POA: Insufficient documentation

## 2015-04-18 DIAGNOSIS — F17210 Nicotine dependence, cigarettes, uncomplicated: Secondary | ICD-10-CM | POA: Insufficient documentation

## 2015-04-18 DIAGNOSIS — Z419 Encounter for procedure for purposes other than remedying health state, unspecified: Secondary | ICD-10-CM

## 2015-04-18 DIAGNOSIS — I10 Essential (primary) hypertension: Secondary | ICD-10-CM | POA: Insufficient documentation

## 2015-04-18 DIAGNOSIS — E039 Hypothyroidism, unspecified: Secondary | ICD-10-CM | POA: Insufficient documentation

## 2015-04-18 DIAGNOSIS — E785 Hyperlipidemia, unspecified: Secondary | ICD-10-CM | POA: Insufficient documentation

## 2015-04-18 DIAGNOSIS — M1990 Unspecified osteoarthritis, unspecified site: Secondary | ICD-10-CM | POA: Diagnosis not present

## 2015-04-18 HISTORY — PX: VIDEO BRONCHOSCOPY WITH ENDOBRONCHIAL NAVIGATION: SHX6175

## 2015-04-18 HISTORY — PX: VIDEO BRONCHOSCOPY WITH ENDOBRONCHIAL ULTRASOUND: SHX6177

## 2015-04-18 SURGERY — VIDEO BRONCHOSCOPY WITH ENDOBRONCHIAL NAVIGATION
Anesthesia: General

## 2015-04-18 MED ORDER — FENTANYL CITRATE (PF) 100 MCG/2ML IJ SOLN
INTRAMUSCULAR | Status: DC | PRN
  Administered 2015-04-18: 100 ug via INTRAVENOUS

## 2015-04-18 MED ORDER — ONDANSETRON HCL 4 MG/2ML IJ SOLN
INTRAMUSCULAR | Status: AC
  Filled 2015-04-18: qty 2

## 2015-04-18 MED ORDER — PROPOFOL 10 MG/ML IV BOLUS
INTRAVENOUS | Status: AC
  Filled 2015-04-18: qty 20

## 2015-04-18 MED ORDER — VECURONIUM BROMIDE 10 MG IV SOLR
INTRAVENOUS | Status: AC
  Filled 2015-04-18: qty 10

## 2015-04-18 MED ORDER — EPINEPHRINE HCL 1 MG/ML IJ SOLN
INTRAMUSCULAR | Status: AC
  Filled 2015-04-18: qty 1

## 2015-04-18 MED ORDER — MIDAZOLAM HCL 2 MG/2ML IJ SOLN
INTRAMUSCULAR | Status: AC
  Filled 2015-04-18: qty 2

## 2015-04-18 MED ORDER — STERILE WATER FOR INJECTION IJ SOLN
INTRAMUSCULAR | Status: AC
  Filled 2015-04-18: qty 10

## 2015-04-18 MED ORDER — LACTATED RINGERS IV SOLN
INTRAVENOUS | Status: DC
  Administered 2015-04-18 (×2): via INTRAVENOUS

## 2015-04-18 MED ORDER — LIDOCAINE HCL (CARDIAC) 20 MG/ML IV SOLN
INTRAVENOUS | Status: AC
  Filled 2015-04-18: qty 5

## 2015-04-18 MED ORDER — ONDANSETRON HCL 4 MG/2ML IJ SOLN: 4.0000 mg | Freq: Once | INTRAMUSCULAR | Status: DC | PRN

## 2015-04-18 MED ORDER — ONDANSETRON HCL 4 MG/2ML IJ SOLN
INTRAMUSCULAR | Status: DC | PRN
  Administered 2015-04-18: 4 mg via INTRAVENOUS

## 2015-04-18 MED ORDER — LIDOCAINE HCL (CARDIAC) 20 MG/ML IV SOLN
INTRAVENOUS | Status: DC | PRN
  Administered 2015-04-18: 100 mg via INTRATRACHEAL
  Administered 2015-04-18: 40 mg via INTRAVENOUS

## 2015-04-18 MED ORDER — DEXAMETHASONE SODIUM PHOSPHATE 4 MG/ML IJ SOLN
INTRAMUSCULAR | Status: AC
  Filled 2015-04-18: qty 1

## 2015-04-18 MED ORDER — OXYCODONE HCL 5 MG PO TABS: 5.0000 mg | ORAL_TABLET | Freq: Once | ORAL | Status: DC | PRN

## 2015-04-18 MED ORDER — PROPOFOL 10 MG/ML IV BOLUS
INTRAVENOUS | Status: DC | PRN
  Administered 2015-04-18: 160 mg via INTRAVENOUS

## 2015-04-18 MED ORDER — PHENYLEPHRINE 40 MCG/ML (10ML) SYRINGE FOR IV PUSH (FOR BLOOD PRESSURE SUPPORT)
PREFILLED_SYRINGE | INTRAVENOUS | Status: AC
  Filled 2015-04-18: qty 10

## 2015-04-18 MED ORDER — PHENYLEPHRINE HCL 10 MG/ML IJ SOLN
10.0000 mg | INTRAVENOUS | Status: DC | PRN
  Administered 2015-04-18: 35 ug/min via INTRAVENOUS

## 2015-04-18 MED ORDER — VECURONIUM BROMIDE 10 MG IV SOLR
INTRAVENOUS | Status: DC | PRN
  Administered 2015-04-18: 7 mg via INTRAVENOUS
  Administered 2015-04-18 (×2): 1 mg via INTRAVENOUS

## 2015-04-18 MED ORDER — DEXAMETHASONE SODIUM PHOSPHATE 4 MG/ML IJ SOLN
INTRAMUSCULAR | Status: DC | PRN
  Administered 2015-04-18: 4 mg via INTRAVENOUS

## 2015-04-18 MED ORDER — OXYCODONE HCL 5 MG/5ML PO SOLN: 5.0000 mg | Freq: Once | ORAL | Status: DC | PRN

## 2015-04-18 MED ORDER — 0.9 % SODIUM CHLORIDE (POUR BTL) OPTIME
TOPICAL | Status: DC | PRN
  Administered 2015-04-18: 1000 mL

## 2015-04-18 MED ORDER — FENTANYL CITRATE (PF) 250 MCG/5ML IJ SOLN
INTRAMUSCULAR | Status: AC
  Filled 2015-04-18: qty 5

## 2015-04-18 MED ORDER — SUGAMMADEX SODIUM 200 MG/2ML IV SOLN
INTRAVENOUS | Status: DC | PRN
  Administered 2015-04-18: 187.8 mg via INTRAVENOUS

## 2015-04-18 MED ORDER — SUGAMMADEX SODIUM 200 MG/2ML IV SOLN
INTRAVENOUS | Status: AC
  Filled 2015-04-18: qty 2

## 2015-04-18 MED ORDER — PHENYLEPHRINE HCL 10 MG/ML IJ SOLN
INTRAMUSCULAR | Status: DC | PRN
  Administered 2015-04-18: 40 ug via INTRAVENOUS
  Administered 2015-04-18: 80 ug via INTRAVENOUS
  Administered 2015-04-18: 120 ug via INTRAVENOUS
  Administered 2015-04-18 (×2): 80 ug via INTRAVENOUS

## 2015-04-18 MED ORDER — HYDROMORPHONE HCL 1 MG/ML IJ SOLN: 0.2500 mg | INTRAMUSCULAR | Status: DC | PRN

## 2015-04-18 MED ORDER — MIDAZOLAM HCL 5 MG/5ML IJ SOLN
INTRAMUSCULAR | Status: DC | PRN
  Administered 2015-04-18: 2 mg via INTRAVENOUS

## 2015-04-18 SURGICAL SUPPLY — 42 items
BRUSH BIOPSY BRONCH 10 SDTNB (MISCELLANEOUS) ×2 IMPLANT
BRUSH BIOPSY BRONCH 10MM SDTNB (MISCELLANEOUS) ×1
BRUSH CYTOL CELLEBRITY 1.5X140 (MISCELLANEOUS) ×6 IMPLANT
BRUSH SUPERTRAX BIOPSY (INSTRUMENTS) ×3 IMPLANT
BRUSH SUPERTRAX NDL-TIP CYTO (INSTRUMENTS) ×6 IMPLANT
CANISTER SUCTION 2500CC (MISCELLANEOUS) ×3 IMPLANT
CHANNEL WORK EXTEND EDGE 180 (KITS) ×3 IMPLANT
CHANNEL WORK EXTEND EDGE 45 (KITS) IMPLANT
CHANNEL WORK EXTEND EDGE 90 (KITS) IMPLANT
CONT SPEC 4OZ CLIKSEAL STRL BL (MISCELLANEOUS) ×3 IMPLANT
COVER TABLE BACK 60X90 (DRAPES) ×3 IMPLANT
FILTER STRAW FLUID ASPIR (MISCELLANEOUS) IMPLANT
FORCEPS BIOP RJ4 1.8 (CUTTING FORCEPS) IMPLANT
FORCEPS BIOP SUPERTRX PREMAR (INSTRUMENTS) IMPLANT
GAUZE SPONGE 4X4 12PLY STRL (GAUZE/BANDAGES/DRESSINGS) ×3 IMPLANT
GLOVE BIO SURGEON STRL SZ7.5 (GLOVE) ×6 IMPLANT
GLOVE BIOGEL M STRL SZ7.5 (GLOVE) ×3 IMPLANT
GOWN STRL REUS W/ TWL LRG LVL3 (GOWN DISPOSABLE) ×1 IMPLANT
GOWN STRL REUS W/TWL LRG LVL3 (GOWN DISPOSABLE) ×2
KIT CLEAN ENDO COMPLIANCE (KITS) ×6 IMPLANT
KIT LOCATABLE GUIDE (CANNULA) IMPLANT
KIT MARKER FIDUCIAL DELIVERY (KITS) IMPLANT
KIT PROCEDURE EDGE 180 (KITS) IMPLANT
KIT PROCEDURE EDGE 45 (KITS) IMPLANT
KIT PROCEDURE EDGE 90 (KITS) ×3 IMPLANT
KIT ROOM TURNOVER OR (KITS) ×3 IMPLANT
MARKER SKIN DUAL TIP RULER LAB (MISCELLANEOUS) ×3 IMPLANT
NEEDLE BIOPSY TRANSBRONCH 21G (NEEDLE) IMPLANT
NEEDLE SUPERTRX PREMARK BIOPSY (NEEDLE) ×6 IMPLANT
NEEDLE SYS SONOTIP II EBUSTBNA (NEEDLE) ×3 IMPLANT
NS IRRIG 1000ML POUR BTL (IV SOLUTION) ×3 IMPLANT
OIL SILICONE PENTAX (PARTS (SERVICE/REPAIRS)) ×3 IMPLANT
PAD ARMBOARD 7.5X6 YLW CONV (MISCELLANEOUS) ×6 IMPLANT
PATCHES PATIENT (LABEL) ×9 IMPLANT
SYR 20CC LL (SYRINGE) ×3 IMPLANT
SYR 20ML ECCENTRIC (SYRINGE) ×6 IMPLANT
SYR 50ML SLIP (SYRINGE) ×3 IMPLANT
SYR 5ML LUER SLIP (SYRINGE) ×3 IMPLANT
TOWEL OR 17X24 6PK STRL BLUE (TOWEL DISPOSABLE) ×3 IMPLANT
TRAP SPECIMEN MUCOUS 40CC (MISCELLANEOUS) ×3 IMPLANT
TUBE CONNECTING 20'X1/4 (TUBING) ×2
TUBE CONNECTING 20X1/4 (TUBING) ×4 IMPLANT

## 2015-04-18 NOTE — Discharge Instructions (Signed)
Flexible Bronchoscopy, Care After °These instructions give you information on caring for yourself after your procedure. Your doctor may also give you more specific instructions. Call your doctor if you have any problems or questions after your procedure. °HOME CARE °· Do not eat or drink anything for 2 hours after your procedure. If you try to eat or drink before the medicine wears off, food or drink could go into your lungs. You could also burn yourself. °· After 2 hours have passed and when you can cough and gag normally, you may eat soft food and drink liquids slowly. °· The day after the test, you may eat your normal diet. °· You may do your normal activities. °· Keep all doctor visits. °GET HELP RIGHT AWAY IF: °· You get more and more short of breath. °· You get light-headed. °· You feel like you are going to pass out (faint). °· You have chest pain. °· You have new problems that worry you. °· You cough up more than a little blood. °· You cough up more blood than before. °MAKE SURE YOU: °· Understand these instructions. °· Will watch your condition. °· Will get help right away if you are not doing well or get worse. °Document Released: 10/12/2009 Document Revised: 12/20/2013 Document Reviewed: 08/19/2013 °Exit[employer]® Patient Information ©2015 [employer]. This information is not intended to replace advice given to you by your health care provider. Make sure you discuss any questions you have with your health care provider. ° °Please call our office for any questions or concerns. [phone].  ° ° ° °

## 2015-04-18 NOTE — Anesthesia Procedure Notes (Signed)
Procedure Name: Intubation Date/Time: 04/18/2015 10:37 AM Performed by: Jenne Campus Pre-anesthesia Checklist: Patient identified, Emergency Drugs available, Suction available, Patient being monitored and Timeout performed Patient Re-evaluated:Patient Re-evaluated prior to inductionOxygen Delivery Method: Circle system utilized Preoxygenation: Pre-oxygenation with 100% oxygen Intubation Type: IV induction and Cricoid Pressure applied Ventilation: Mask ventilation without difficulty Laryngoscope Size: Miller and 2 Grade View: Grade II Tube type: Oral Tube size: 8.5 mm Number of attempts: 1 Airway Equipment and Method: Stylet Placement Confirmation: ETT inserted through vocal cords under direct vision,  positive ETCO2,  CO2 detector and breath sounds checked- equal and bilateral Secured at: 21 cm Tube secured with: Tape Dental Injury: Teeth and Oropharynx as per pre-operative assessment

## 2015-04-18 NOTE — H&P (View-Only) (Signed)
   Subjective:    Patient ID: Tiffany [name], female    DOB: 05/19/1946, 68 y.o.   MRN: [id number]  HPI The patient is a 68-year-old female who I've been asked to see for management of a pulmonary nodule. She recently had a chest x-ray as part of her yearly checkup, and was noted to have nodular densities in the left upper lobe. This was followed by a CT chest which verified the presence of abnormal densities that appear to last into 1 area, but no other significant abnormality. She then underwent a PET scan where she was found to have significant uptake in the left upper lobe process, and questionable abnormal uptake in a left hilar node. The patient has a long history of smoking, and unfortunately continues to do so. She has an ongoing chronic cough with mucus production, but has not had any hemoptysis. She denies chest discomfort, has been eating well, and has not been losing weight. She has no personal history of cancer, but does have an enlarged thyroid consistent with a multinodular goiter on current imaging. [place] and has no history of TB exposure.   Review of Systems  Constitutional: Negative for fever and unexpected weight change.  HENT: Positive for congestion. Negative for dental problem, ear pain, nosebleeds, postnasal drip, rhinorrhea, sinus pressure, sneezing, sore throat and trouble swallowing.   Eyes: Negative for redness and itching.  Respiratory: Negative for cough, chest tightness, shortness of breath and wheezing.   Cardiovascular: Negative for palpitations and leg swelling.  Gastrointestinal: Negative for nausea and vomiting.  Genitourinary: Negative for dysuria.  Musculoskeletal: Negative for joint swelling.  Skin: Negative for rash.  Neurological: Negative for headaches.  Hematological: Does not bruise/bleed easily.  Psychiatric/Behavioral: Negative for dysphoric mood. The patient is not nervous/anxious.          Objective:   Physical Exam Constitutional:  Obese female, no acute distress  HENT:  Nares patent without discharge  Oropharynx without exudate, palate and uvula are normal  Eyes:  Perrla, eomi, no scleral icterus  Neck:  No JVD, no TMG  Cardiovascular:  Normal rate, regular rhythm, no rubs or gallops.  No murmurs        Intact distal pulses but diminished  Pulmonary :  Normal breath sounds, no stridor or respiratory distress   No rales, rhonchi, or wheezing  Abdominal:  Soft, nondistended, bowel sounds present.  No tenderness noted.   Musculoskeletal:  1+ lower extremity edema noted.  Lymph Nodes:  No cervical lymphadenopathy noted  Skin:  No cyanosis noted  Neurologic:  Alert, appropriate, moves all 4 extremities without obvious deficit.         Assessment & Plan:   

## 2015-04-18 NOTE — Op Note (Signed)
Video Bronchoscopy with Endobronchial Ultrasound and Electromagnetic Navigation Procedure Note  Date of Operation: 04/18/2015  Pre-op Diagnosis: LUL nodules, L hilar adenopathy  Post-op Diagnosis: same  Surgeon: Baltazar Apo  Assistants: none  Anesthesia: General endotracheal anesthesia  Operation: Flexible video fiberoptic bronchoscopy with endobronchial ultrasound and biopsies.  Estimated Blood Loss: Minimal  Complications: none apparent  Indications and History: Tiffany [name] is a 69 y.o. female smoker referred to Dr Gwenette Greet for evaluation of LUL nodules. Recommendation was made to pursue tissue diagnosis via navigational bronchoscopy. Plans were also made to sample any enlarged mediastinal or hilar lymph nodes.  The risks, benefits, complications, treatment options and expected outcomes were discussed with the patient.  The possibilities of pneumothorax, pneumonia, reaction to medication, pulmonary aspiration, perforation of a viscus, bleeding, failure to diagnose a condition and creating a complication requiring transfusion or operation were discussed with the patient who freely signed the consent.    Description of Procedure: The patient was examined in the preoperative area and history and data from the preprocedure consultation were reviewed. It was deemed appropriate to proceed.  The patient was taken to [facility] OR 10, identified as ALLYN BARTELSON and the procedure verified as Flexible Video Fiberoptic Bronchoscopy.  A Time Out was held and the above information confirmed. After being taken to the operating room general anesthesia was initiated and the patient  was orally intubated. The video fiberoptic bronchoscope was introduced via the endotracheal tube and a general inspection was performed which showed Normal airways throughout. The orifice to the lingular was narrowed and slightly fish-mouthed, but the scope would pass into the lingular airway revealing normal segmental carinae and  airways. Two R mainstem brushings were obtained for Percepta molecular testing. The standard scope was then withdrawn and the endobronchial ultrasound was used to identify and characterize the peritracheal, hilar and bronchial lymph nodes. Inspection showed enlargement of a pre-carinal 4R node. The 4R node interestingly showed some more opaque central tissue (please refer to photos uploaded into EPIC as OR Nursing Note).  Using real-time ultrasound guidance Wang needle biopsies were take from Station 4R node and was sent for cytology. Similarly the 11L node was identified and needle biopsied.   Following the Wang needle biopsies, attention was turned to the LUL nodules.  Prior to the date of the procedure a high-resolution CT scan of the chest was performed. Utilizing [place] a virtual tracheobronchial tree was generated to allow the creation of distinct navigation pathways to the patient's LUL nodules. The extendable working channel and locator guide were introduced into the bronchoscope. The distinct navigation pathways prepared prior to this procedure were then utilized to navigate to within 0.3cm of patient's lesions identified on CT scan. The extendable working channel was secured into place and the locator guide was withdrawn. Under fluoroscopic guidance transbronchial needle brushings, transbronchial Wang needle biopsies, and transbronchial forceps biopsies were performed on the two nodules (the more peripheral labelled target 1, the more proximal labelled target 2) to be sent for cytology and pathology. A bronchioalveolar lavage was performed in the LUL and sent for cytology and microbiology (bacterial, fungal, AFB smears and cultures). At the end of the procedure a general airway inspection was performed and there was no evidence of active bleeding. The bronchoscope was removed.  The patient tolerated the procedure well. There was no significant blood loss and there were no obvious  complications. A post-procedural chest x-ray showed no evidence of PTX.   Samples: 1. Wang needle biopsies from  4R node 2. Wang needle biopsies from 11L node 3. Transbronchial needle brushings from LUL targets 1 and 2 4. Transbronchial Wang needle biopsies from LUL targets 1 and 2 5. Transbronchial forceps biopsies from LUL targets 1 and 2 6. Bronchoalveolar lavage from LUL 7. Endobronchial brushings from R mainstem   Plans:  The patient will be discharged from the PACU to home when recovered from anesthesia.. We will review the cytology, pathology and microbiology results with the patient when they become available. Outpatient followup will be with either Dr Lamonte Sakai or Dr Gwenette Greet.    Baltazar Apo, MD, PhD 04/18/2015, 1:48 PM [facility] [phone] or if no answer [phone]

## 2015-04-18 NOTE — Research (Signed)
The [place]: Prospective Registry to Evaluate Percepta Bronchial Genomic Classifier Patient Data  This patient, Tiffany [name], consented to the above clinical trial according to FDA regulations, GCP guidelines and [facility] SOPs. The study design has been explained to this patient by this RN, and the patient demonstrated comprehension. No study procedures have been initiated before consenting of this patient. This patient was given sufficient time for reading the consent and asking questions. All risks, benefits and options have been thoroughly discussed. This patient was not coerced in any way to participate or to continue participation in this clinical trial. The patient has signed voluntarily at 09:40 on April 18, 2015. This patient was given a copy of this consent.   Doreatha Martin, RN [phone]

## 2015-04-18 NOTE — Transfer of Care (Signed)
Immediate Anesthesia Transfer of Care Note  Patient: Tiffany [name]  Procedure(s) Performed: Procedure(s): VIDEO BRONCHOSCOPY WITH ENDOBRONCHIAL NAVIGATION (N/A) VIDEO BRONCHOSCOPY WITH ENDOBRONCHIAL ULTRASOUND (N/A)  Patient Location: PACU  Anesthesia Type:General  Level of Consciousness: awake, oriented and patient cooperative  Airway & Oxygen Therapy: Patient Spontanous Breathing and Patient connected to nasal cannula oxygen  Post-op Assessment: Report given to RN and Post -op Vital signs reviewed and stable  Post vital signs: Reviewed  Last Vitals:  Filed Vitals:   04/18/15 1330  BP: 121/71  Pulse: 69  Temp:   Resp: 16    Complications: No apparent anesthesia complications

## 2015-04-18 NOTE — Anesthesia Postprocedure Evaluation (Signed)
  Anesthesia Post-op Note  Patient: Tiffany [name]  Procedure(s) Performed: Procedure(s): VIDEO BRONCHOSCOPY WITH ENDOBRONCHIAL NAVIGATION (N/A) VIDEO BRONCHOSCOPY WITH ENDOBRONCHIAL ULTRASOUND (N/A)  Patient Location: PACU  Anesthesia Type:General  Level of Consciousness: awake, alert  and oriented  Airway and Oxygen Therapy: Patient Spontanous Breathing  Post-op Pain: mild  Post-op Assessment: Post-op Vital signs reviewed, Patient's Cardiovascular Status Stable, Respiratory Function Stable, Patent Airway and Pain level controlled  Post-op Vital Signs: stable  Last Vitals:  Filed Vitals:   04/18/15 1421  BP:   Pulse: 74  Temp:   Resp:     Complications: No apparent anesthesia complications

## 2015-04-18 NOTE — Interval H&P Note (Signed)
PCCM Interval Note  Pt presents today for further eval of her LUL nodules and mediastinal LAD. She underwent PET as detailed in Dr Janifer Adie notes. Since last visit in our office she was treated with azithro for increase in sputum (finished 1 week ago). The mucous cleared with the abx. She also notes some increased LE edema over the last 2 weeks - she has not changed her lasix dosing.   Filed Vitals:   04/18/15 0836  BP: 132/48  Pulse: 70  Temp: 97.8 F (36.6 C)  Resp: 18   Gen: Pleasant, well-nourished, in no distress on RA,  normal affect  ENT: No lesions,  mouth clear,  oropharynx clear, no postnasal drip  Lungs: No use of accessory muscles, clear without rales or rhonchi  Cardiovascular: RRR, heart sounds normal, no murmur or gallops, 2+ B ankle and pretibial edema  Musculoskeletal: No deformities, no cyanosis or clubbing  Neuro: alert, non focal  Skin: Warm, no lesions or rashes   Recent Labs Lab 04/17/15 1338  HGB 13.7  HCT 41.9  WBC 10.0  PLT 356    Recent Labs Lab 04/17/15 1338  NA 134*  K 4.4  CL 98  CO2 26  GLUCOSE 100*  BUN 13  CREATININE 1.15*  CALCIUM 9.2   PET scan 04/03/15 --  IMPRESSION: 1. There is malignant range FDG uptake associated with the 2 nodules in the left upper lobe identified on recent chest CT. Cannot rule out primary pulmonary neoplasm. Correlation with tissue sampling is recommended. 2. Mildly increased FDG uptake associated with the left hilar region which is above blood pool activity. Left hilar lymph node metastasis cannot be excluded. 3. Left rib fractures. 4. Abnormal increased radiotracer uptake associated with the distended left hip joint space. Correlation for any clinical signs or symptoms of infection within this area is recommended.  Plans:  Will proceed with FOB, EBUS and navigational biopsies. Pt understands the procedure. All questions answered and consent signed.   Baltazar Apo, MD, PhD 04/18/2015, 9:00  AM [facility] [phone] or if no answer [phone]

## 2015-04-20 ENCOUNTER — Encounter (HOSPITAL_COMMUNITY): Payer: Self-pay | Admitting: Emergency Medicine

## 2015-04-20 ENCOUNTER — Ambulatory Visit (INDEPENDENT_AMBULATORY_CARE_PROVIDER_SITE_OTHER): Payer: Medicare Other | Admitting: Pulmonary Disease

## 2015-04-20 VITALS — BP 112/66 | HR 73 | Temp 97.6°F | Ht 62.0 in | Wt 208.8 lb

## 2015-04-20 DIAGNOSIS — R911 Solitary pulmonary nodule: Secondary | ICD-10-CM | POA: Diagnosis not present

## 2015-04-20 NOTE — Progress Notes (Signed)
   Subjective:    Patient ID: Tiffany [name], female    DOB: 1946/01/18, 69 y.o.   MRN: [id number]  HPI Patient comes in today for follow-up of her recent bronchoscopy for her pulmonary nodules. Her endobronchial ultrasound of the 4R and 11L nodes were negative for malignancy, and a navigational bronchoscopy of the nodules in question were also negative for malignancy. I have reviewed all of this with her in detail, and also discussed the various options for management from this point forward.   Review of Systems  Constitutional: Negative for fever and unexpected weight change.  HENT: Negative for congestion, dental problem, ear pain, nosebleeds, postnasal drip, rhinorrhea, sinus pressure, sneezing, sore throat and trouble swallowing.   Eyes: Negative for redness and itching.  Respiratory: Negative for cough, chest tightness, shortness of breath and wheezing.   Cardiovascular: Negative for palpitations and leg swelling.  Gastrointestinal: Negative for nausea and vomiting.  Genitourinary: Negative for dysuria.  Musculoskeletal: Negative for joint swelling.  Skin: Negative for rash.  Neurological: Negative for headaches.  Hematological: Does not bruise/bleed easily.  Psychiatric/Behavioral: Negative for dysphoric mood. The patient is not nervous/anxious.        Objective:   Physical Exam Obese female in no acute distress Nose without purulence or discharge noted Lower extremities with mild edema, no cyanosis Alert and oriented, moves all 4 extremities.       Assessment & Plan:

## 2015-04-20 NOTE — Patient Instructions (Signed)
Will schedule for breathing studies early next week. Will refer to a surgeon to consider removing your abnormal spot.

## 2015-04-20 NOTE — Assessment & Plan Note (Signed)
The patient has undergone navigational bronchoscopy and endobronchial ultrasound with negative results being found on pathology. Given the size of the nodules in question, the positive PET scan, and the fact the patient is a lifelong smoker, it is very likely this represents a malignancy. I think she needs to have a surgical resection at this time provided that her PFTs are adequate. Will schedule for PFTs, and make the referral to cardiothoracic surgery.

## 2015-04-21 LAB — CULTURE, RESPIRATORY: GRAM STAIN: NONE SEEN

## 2015-04-21 LAB — CULTURE, RESPIRATORY W GRAM STAIN

## 2015-04-22 ENCOUNTER — Other Ambulatory Visit: Payer: Self-pay | Admitting: Internal Medicine

## 2015-04-24 ENCOUNTER — Ambulatory Visit (HOSPITAL_COMMUNITY)
Admission: RE | Admit: 2015-04-24 | Discharge: 2015-04-24 | Disposition: A | Discharge status: Home / Self Care | Payer: Medicare Other | Source: Ambulatory Visit | Attending: Pulmonary Disease | Admitting: Pulmonary Disease

## 2015-04-24 DIAGNOSIS — R911 Solitary pulmonary nodule: Secondary | ICD-10-CM | POA: Insufficient documentation

## 2015-04-24 LAB — PULMONARY FUNCTION TEST
DL/VA % PRED: 68 %
DL/VA: 3.37 ml/min/mmHg/L
DLCO unc % pred: 42 %
DLCO unc: 10.83 ml/min/mmHg
FEF 25-75 POST: 1.38 L/s
FEF 25-75 PRE: 1.87 L/s
FEF2575-%CHANGE-POST: -26 %
FEF2575-%PRED-POST: 65 %
FEF2575-%PRED-PRE: 88 %
FEV1-%Change-Post: -7 %
FEV1-%Pred-Post: 60 %
FEV1-%Pred-Pre: 64 %
FEV1-Post: 1.49 L
FEV1-Pre: 1.6 L
FEV1FVC-%Change-Post: -4 %
FEV1FVC-%PRED-PRE: 108 %
FEV6-%Change-Post: -3 %
FEV6-%PRED-PRE: 61 %
FEV6-%Pred-Post: 59 %
FEV6-Post: 1.84 L
FEV6-Pre: 1.91 L
FEV6FVC-%PRED-POST: 104 %
FEV6FVC-%Pred-Pre: 104 %
FVC-%Change-Post: -3 %
FVC-%PRED-PRE: 59 %
FVC-%Pred-Post: 57 %
FVC-PRE: 1.91 L
FVC-Post: 1.85 L
PRE FEV6/FVC RATIO: 100 %
Post FEV1/FVC ratio: 80 %
Post FEV6/FVC ratio: 100 %
Pre FEV1/FVC ratio: 84 %
RV % pred: 81 %
RV: 1.76 L
TLC % pred: 72 %
TLC: 3.76 L

## 2015-04-24 MED ORDER — ALBUTEROL SULFATE (2.5 MG/3ML) 0.083% IN NEBU
2.5000 mg | INHALATION_SOLUTION | Freq: Once | RESPIRATORY_TRACT | Status: AC
  Administered 2015-04-24: 2.5 mg via RESPIRATORY_TRACT

## 2015-04-25 DIAGNOSIS — R1030 Lower abdominal pain, unspecified: Secondary | ICD-10-CM | POA: Diagnosis not present

## 2015-04-25 DIAGNOSIS — M1990 Unspecified osteoarthritis, unspecified site: Secondary | ICD-10-CM | POA: Diagnosis not present

## 2015-04-25 DIAGNOSIS — M25559 Pain in unspecified hip: Secondary | ICD-10-CM | POA: Diagnosis not present

## 2015-04-26 ENCOUNTER — Encounter: Payer: Self-pay | Admitting: Physician Assistant

## 2015-05-01 ENCOUNTER — Encounter: Payer: Self-pay | Admitting: Thoracic Surgery (Cardiothoracic Vascular Surgery)

## 2015-05-01 ENCOUNTER — Other Ambulatory Visit: Payer: Self-pay | Admitting: *Deleted

## 2015-05-01 ENCOUNTER — Institutional Professional Consult (permissible substitution) (INDEPENDENT_AMBULATORY_CARE_PROVIDER_SITE_OTHER): Payer: Medicare Other | Admitting: Thoracic Surgery (Cardiothoracic Vascular Surgery)

## 2015-05-01 ENCOUNTER — Ambulatory Visit (HOSPITAL_COMMUNITY)
Admission: RE | Admit: 2015-05-01 | Discharge: 2015-05-01 | Disposition: A | Discharge status: Home / Self Care | Payer: Medicare Other | Source: Ambulatory Visit | Attending: Thoracic Surgery (Cardiothoracic Vascular Surgery) | Admitting: Thoracic Surgery (Cardiothoracic Vascular Surgery)

## 2015-05-01 VITALS — BP 129/76 | HR 71 | Resp 16 | Ht 67.0 in | Wt 204.0 lb

## 2015-05-01 DIAGNOSIS — R609 Edema, unspecified: Secondary | ICD-10-CM

## 2015-05-01 DIAGNOSIS — D381 Neoplasm of uncertain behavior of trachea, bronchus and lung: Secondary | ICD-10-CM | POA: Diagnosis not present

## 2015-05-01 DIAGNOSIS — R911 Solitary pulmonary nodule: Secondary | ICD-10-CM

## 2015-05-01 DIAGNOSIS — R918 Other nonspecific abnormal finding of lung field: Secondary | ICD-10-CM

## 2015-05-01 NOTE — Progress Notes (Signed)
PCP is [name], MD Referring Provider is Clance, Armando Reichert, MD  Chief Complaint  Patient presents with  . Lung Lesion    CT CHEST 03/27/15, PET 04/03/15, PFT'S 04/24/15    HPI: 69 year old woman sent for consultation regarding a left upper lobe nodules.  Tiffany [name] is a 69 year old woman who has a past medical history significant for hypertension, neuropathy, tobacco abuse (30 pack years), hyperthyroidism, and arthritis. Tiffany [name] saw Dr. Melford Aase in November for an annual checkup. As part of Tiffany [name] workup a chest x-ray was done. It showed some nodular densities in the left upper lobe. A follow-up x-ray was done in March which showed the nodules are still present. A CT scan was done which showed 2 nodules, 13 and 14 mm respectively, in close proximity in the left upper lobe. A PET CT showed the lesion were hypermetabolic. There also were multiple rib fractures, left fifth, sixth and seventh ribs. There also was an abnormality noted in the left hip, which was not felt to be metastatic.  Tiffany [name] had bronchoscopy and endobronchial ultrasound. The results from those were unrevealing. AFB and fungal stains were negative.  Tiffany [name] is still smoking about one half pack of cigarettes daily. Tiffany [name] is trying to quit. Physical activity is limited due to neuropathy in Tiffany [name] feet a Charcot's joint in Tiffany [name] ankle and left hip pain. Tiffany [name] does use a walker. Tiffany [name] denies chest pain, pressure, and tightness. Tiffany [name] has no cardiac history. Tiffany [name] denies shortness of breath. Tiffany [name] denies cough, hemoptysis, wheezing. Tiffany [name] weight is unchanged over the past 3 months. Tiffany [name] has not had any unusual headaches or visual changes. Tiffany [name] does not remember any recent trauma involving Tiffany [name] chest, but thinks Tiffany [name] may have broken ribs when Tiffany [name] fractured Tiffany [name] neck and had to have spinal fusion in 2008.     Past Medical History  Diagnosis Date  . High blood pressure   . Gait difficulty   . Balance disorder   . Hyperlipidemia   . Pre-diabetes   . Vitamin D  deficiency   . Obesity (BMI 35.0-39.9 without comorbidity)   . Neuropathy   . Hyperthyroidism mild  . Arthritis     Past Surgical History  Procedure Laterality Date  . Vaginal hysterectomy    . Cervical disc arthroplasty    . Rotator cuff repair    . Spinal fusion    . Tonsillectomy    . Knee replaced      right knee x 2  . Video bronchoscopy with endobronchial navigation N/A 04/18/2015    Procedure: VIDEO BRONCHOSCOPY WITH ENDOBRONCHIAL NAVIGATION;  Surgeon: Collene Gobble, MD;  Location: [facility];  Service: Thoracic;  Laterality: N/A;  . Video bronchoscopy with endobronchial ultrasound N/A 04/18/2015    Procedure: VIDEO BRONCHOSCOPY WITH ENDOBRONCHIAL ULTRASOUND;  Surgeon: Collene Gobble, MD;  Location: [facility];  Service: Thoracic;  Laterality: N/A;    Family History  Problem Relation Age of Onset  . Heart attack Father   . Hypertension Mother   . Breast cancer Maternal Aunt   . Colon cancer Paternal Grandmother     Social History History  Substance Use Topics  . Smoking status: Current Every Day Smoker -- 1.00 packs/day for 30 years    Types: Cigarettes  . Smokeless tobacco: Never Used     Comment: currently 1/2 ppd  . Alcohol Use: 0.6 oz/week    1 Glasses of wine per week     Comment: 2 glasses wine daily    Current Outpatient Prescriptions  Medication Sig Dispense Refill  . atenolol (TENORMIN) 100 MG tablet TAKE 1 TABLET BY MOUTH DAILY FOR BLOOD PRESSURE (Patient taking differently: TAKE 1/2 TABLET BY MOUTH DAILY FOR BLOOD PRESSURE) 90 tablet 3  . buPROPion (WELLBUTRIN SR) 150 MG 12 hr tablet TAKE 1 TABLET BY MOUTH TWICE DAILY 60 tablet 2  . Cholecalciferol (VITAMIN D PO) Take 5,000 Units by mouth 2 (two) times daily.     . diclofenac (VOLTAREN) 75 MG EC tablet TAKE 1 TABLET BY MOUTH TWICE DAILY 60 tablet 1  . diphenhydrAMINE (BENADRYL) 25 MG tablet Take 25 mg by mouth every 8 (eight) hours as needed for allergies.    Marland Kitchen enalapril (VASOTEC) 20 MG tablet TAKE 1 TABLET BY  MOUTH DAILY FOR HIGH BLOOD PRESSURE 90 tablet 1  . furosemide (LASIX) 40 MG tablet Take 40 mg by mouth daily.    Marland Kitchen HYDROcodone-acetaminophen (NORCO/VICODIN) 5-325 MG per tablet Take 1 tablet by mouth every 8 (eight) hours as needed for moderate pain.     . Multiple Vitamins-Minerals (MULTIVITAMIN PO) Take by mouth.    . pravastatin (PRAVACHOL) 40 MG tablet TAKE 1 TABLET BY MOUTH EVERY NIGHT AT BEDTIME FOR CHOLESTEROL 90 tablet 1   No current facility-administered medications for this visit.    No Known Allergies  Review of Systems  Constitutional: Positive for activity change (limited due to hip pain and neuropathy). Negative for fever, chills, appetite change and unexpected weight change.  Respiratory: Negative for chest tightness and shortness of breath.   Cardiovascular: Positive for leg swelling (3 weeks). Negative for chest pain.  Endocrine:       Borderline hyperthyroid  Musculoskeletal: Positive for joint swelling, arthralgias, gait problem and neck stiffness.  Neurological: Positive for numbness (feet- "hereditary neuropathy").  Hematological: Bruises/bleeds easily.  All other systems reviewed and are negative.   BP 129/76 mmHg  Pulse 71  Resp 16  Ht '5\' 7"'$  (1.702 m)  Wt 204 lb (92.534 kg)  BMI 31.94 kg/m2  SpO2 99% Physical Exam  Constitutional: Tiffany [name] is oriented to person, place, and time. Tiffany [name] appears well-developed and well-nourished. No distress.  HENT:  Head: Normocephalic and atraumatic.  Eyes: EOM are normal.  Neck: Thyromegaly present.  Cardiovascular: Normal rate, regular rhythm and normal heart sounds.   No murmur heard. Pulmonary/Chest: Effort normal and breath sounds normal. Tiffany [name] has no wheezes. Tiffany [name] has no rales.  Abdominal: Soft. There is no tenderness.  Musculoskeletal: Tiffany [name] exhibits edema (2+ bilaterally).  Lymphadenopathy:    Tiffany [name] has no cervical adenopathy.  Neurological: Tiffany [name] is alert and oriented to person, place, and time. No cranial nerve deficit.  No  motor deficit, walks with walker  Skin: Skin is warm and dry.  Vitals reviewed.    Diagnostic Tests: CT CHEST WITH CONTRAST  TECHNIQUE: Multidetector CT imaging of the chest was performed during intravenous contrast administration.  CONTRAST: 75 cc Isovue 300 IV  COMPARISON: Chest x-ray 02/27/2015.  FINDINGS: CT CHEST FINDINGS  Mediastinum/Nodes: No mediastinal, hilar, or axillary adenopathy. Heart is normal size. Aorta is normal caliber. Multiple small bilateral thyroid nodules, most compatible with multinodular goiter.  Lungs/Pleura: 2 adjacent nodules in the left upper lobe corresponding to the density on prior chest x-ray. These measure 14 mm (lateral) and 13 mm (medial) on image 27. The lateral nodule has a somewhat spiculated appearance. No other suspicious pulmonary nodules. No pleural effusions. No confluent areas of consolidation.  Musculoskeletal: Chest wall soft tissues are unremarkable. No acute bony abnormality or focal bone lesion.  Upper abdomen: Imaging into the upper abdomen shows no acute findings.  IMPRESSION: Two adjacent left upper lobe pulmonary nodules as described above. Cannot exclude lung cancer. These should be further evaluated with PET CT.  Multinodular scratch head multiple nodules throughout the thyroid, likely multinodular goiter.   Electronically Signed  By: Rolm Baptise M.D.  On: 03/27/2015 12:31  NUCLEAR MEDICINE PET SKULL BASE TO THIGH  TECHNIQUE: 10.2 mCi F-18 FDG was injected intravenously. Full-ring PET imaging was performed from the skull base to thigh after the radiotracer. CT data was obtained and used for attenuation correction and anatomic localization.  FASTING BLOOD GLUCOSE: Value: 94 mg/dl  COMPARISON: None.  FINDINGS: NECK  No hypermetabolic lymph nodes in the neck.  CHEST  There is diffuse enlargement of the thyroid gland which contains multiple nodules. There are 2 nodules  within the left upper lobe. The first nodule measures 1.6 cm and has an SUV max equal to 3.3. The adjacent nodule measures 1.1 cm and has an SUV max equal to 3.2. There is mild increased uptake within the left hilar region. Index measurement has an SUV max equal to 4.5. This is compared with blood pool activity of 3.2.  ABDOMEN/PELVIS  No abnormal hypermetabolic activity within the liver, pancreas, adrenal glands, or spleen. No hypermetabolic lymph nodes in the abdomen or pelvis.  SKELETON  No focal hypermetabolic activity to suggest skeletal metastasis. There is distension of the left hip joint with asymmetric increased FDG uptake, image 173 of series 4. Fractures involve the fifth, sixth and seventh rib fractures on the left.  IMPRESSION: 1. There is malignant range FDG uptake associated with the 2 nodules in the left upper lobe identified on recent chest CT. Cannot rule out primary pulmonary neoplasm. Correlation with tissue sampling is recommended. 2. Mildly increased FDG uptake associated with the left hilar region which is above blood pool activity. Left hilar lymph node metastasis cannot be excluded. 3. Left rib fractures. 4. Abnormal increased radiotracer uptake associated with the distended left hip joint space. Correlation for any clinical signs or symptoms of infection within this area is recommended.   Electronically Signed  By: Kerby Moors M.D.  On: 04/03/2015 08:59  Pulmonary Function Tests FVC 1.91 (59%), post bronchodilator 1.85 (57%) FEV1 1.60 (64%), post bronchodilator 1.49 (60%) FEV1/FVC 0.84 DLCO 42%  Impression: 69 year old woman with a history of tobacco abuse with 2 left upper lobe nodules (possibly a dumbbell type lesion) that are hypermetabolic by PET CT. There also was some hypermetabolic activity in the hilum, but no definite adenopathy in that area. This is highly suspicious for primary bronchogenic carcinoma, and has to be  considered that unless he can be proven otherwise.  I reviewed the CT scan and PET/CT personally and also reviewed the films with Tiffany [name]. I also reviewed the pulmonary function test.  I discussed several options with Tiffany [name]. 1. Continued radiographic follow-up- I am not in favor of this giving Tiffany [name] age, smoking history and appearance on PET/CT.  2. CT-guided needle biopsy- we discussed the pros and cons of this prior to surgery. They understand that a negative CT guided biopsy would not definitively rule out cancer.  3. Left VATS, wedge resection, with possible lobectomy depending on frozen section results, and cryo-analgesia of intercostal nerves. They understand that this would be definitively diagnostic. They do understand this is a major operative procedure with significant risk.  After a lengthy discussion of the options, Tiffany [name] wishes to proceed with surgical resection for definitive  diagnosis.  I have discussed the general nature of the procedure, the need for general anesthesia, and the incisions to be used for left VATS, wedge resection, possible left upper lobectomy, chronic allergies of intercostal nerves. I reviewed the indications, risks, benefits, and alternatives. We discussed the expected hospital stay, overall recovery and short and long term outcomes. They understand the risks include, but are not limited to death, stroke, MI, DVT/PE, bleeding, possible need for transfusion, infections, cardiac arrhythmias, prolonged air leaks, and other unforeseeable complications.   Tiffany [name] does have a new peripheral edema that Tiffany [name] says has developed over the past 3 weeks. It is bilateral. It is significant. Tiffany [name] has been on Lasix 20 mg daily chronically for hypertension. Tiffany [name] says Tiffany [name]'s never had problems with swelling before. Given the possibility that there is malignancy I think that Tiffany [name] needs a duplex to rule out DVT before proceeding with surgery. I will try to schedule that today. I also  advised Tiffany [name] to increase Tiffany [name] Lasix to 40 mg daily and elevate Tiffany [name] legs.   Plan: Increase Lasix to 40 mg daily  Duplex lower extremities rule out DVT  Tiffany [name] accepts the risk and wishes to proceed with surgical resection. Left VATS, wedge resection, possible left upper lobectomy, cryo-analgesia of intercostal nerves on Wednesday, 05/09/2015.  [name], MD [facility] [phone]

## 2015-05-01 NOTE — Progress Notes (Signed)
VASCULAR LAB PRELIMINARY  PRELIMINARY  PRELIMINARY  PRELIMINARY  Bilateral lower extremity venous duplex completed.    Preliminary report:  Bilateral:  No evidence of DVT, superficial thrombosis, or Baker's Cyst. Mild to moderate interstitial fluid noted in the distal lower leg.   Travares Nelles, Amorita, RVS 05/01/2015, 12:27 PM

## 2015-05-07 ENCOUNTER — Ambulatory Visit (HOSPITAL_COMMUNITY)
Admission: RE | Admit: 2015-05-07 | Discharge: 2015-05-07 | Disposition: A | Discharge status: Home / Self Care | Payer: Medicare Other | Source: Ambulatory Visit | Attending: Thoracic Surgery (Cardiothoracic Vascular Surgery) | Admitting: Thoracic Surgery (Cardiothoracic Vascular Surgery)

## 2015-05-07 ENCOUNTER — Encounter (HOSPITAL_COMMUNITY)
Admission: RE | Admit: 2015-05-07 | Discharge: 2015-05-07 | Disposition: A | Discharge status: Home / Self Care | Payer: Medicare Other | Source: Ambulatory Visit | Attending: Thoracic Surgery (Cardiothoracic Vascular Surgery) | Admitting: Thoracic Surgery (Cardiothoracic Vascular Surgery)

## 2015-05-07 VITALS — BP 122/50 | HR 67 | Temp 97.9°F | Resp 18 | Ht 67.0 in | Wt 200.1 lb

## 2015-05-07 DIAGNOSIS — R911 Solitary pulmonary nodule: Secondary | ICD-10-CM | POA: Diagnosis not present

## 2015-05-07 LAB — CBC
HCT: 41 % (ref 36.0–46.0)
Hemoglobin: 13.4 g/dL (ref 12.0–15.0)
MCH: 32.8 pg (ref 26.0–34.0)
MCHC: 32.7 g/dL (ref 30.0–36.0)
MCV: 100.2 fL — ABNORMAL HIGH (ref 78.0–100.0)
Platelets: 286 10*3/uL (ref 150–400)
RBC: 4.09 MIL/uL (ref 3.87–5.11)
RDW: 13.8 % (ref 11.5–15.5)
WBC: 11 10*3/uL — AB (ref 4.0–10.5)

## 2015-05-07 LAB — SURGICAL PCR SCREEN
MRSA, PCR: NEGATIVE
Staphylococcus aureus: NEGATIVE

## 2015-05-07 LAB — URINALYSIS, ROUTINE W REFLEX MICROSCOPIC
BILIRUBIN URINE: NEGATIVE
Glucose, UA: NEGATIVE mg/dL
Hgb urine dipstick: NEGATIVE
Ketones, ur: NEGATIVE mg/dL
Leukocytes, UA: NEGATIVE
Nitrite: NEGATIVE
Protein, ur: NEGATIVE mg/dL
Specific Gravity, Urine: 1.007 (ref 1.005–1.030)
UROBILINOGEN UA: 0.2 mg/dL (ref 0.0–1.0)
pH: 6 (ref 5.0–8.0)

## 2015-05-07 LAB — COMPREHENSIVE METABOLIC PANEL
ALBUMIN: 3.4 g/dL — AB (ref 3.5–5.0)
ALT: 24 U/L (ref 14–54)
AST: 21 U/L (ref 15–41)
Alkaline Phosphatase: 75 U/L (ref 38–126)
Anion gap: 12 (ref 5–15)
BILIRUBIN TOTAL: 0.7 mg/dL (ref 0.3–1.2)
BUN: 19 mg/dL (ref 6–20)
CO2: 21 mmol/L — AB (ref 22–32)
Calcium: 9 mg/dL (ref 8.9–10.3)
Chloride: 99 mmol/L — ABNORMAL LOW (ref 101–111)
Creatinine, Ser: 1.21 mg/dL — ABNORMAL HIGH (ref 0.44–1.00)
GFR calc Af Amer: 53 mL/min — ABNORMAL LOW (ref >60)
GFR calc non Af Amer: 46 mL/min — ABNORMAL LOW (ref >60)
Glucose, Bld: 87 mg/dL (ref 70–99)
POTASSIUM: 3.6 mmol/L (ref 3.5–5.1)
Sodium: 132 mmol/L — ABNORMAL LOW (ref 135–145)
Total Protein: 6.1 g/dL — ABNORMAL LOW (ref 6.5–8.1)

## 2015-05-07 LAB — PROTIME-INR
INR: 0.96 (ref 0.00–1.49)
Prothrombin Time: 12.9 seconds (ref 11.6–15.2)

## 2015-05-07 LAB — APTT: APTT: 31 s (ref 24–37)

## 2015-05-07 NOTE — Pre-Procedure Instructions (Signed)
Tiffany [name]  05/07/2015   Your procedure is scheduled on:  May 09, 2015  Report to [facility] Admitting at 6:30 AM.  Call this number if you have problems the morning of surgery: [phone]   Remember:   Do not eat food or drink liquids after midnight.   Take these medicines the morning of surgery with A SIP OF WATER: atenolol (TENORMIN), buPROPion (WELLBUTRIN SR)    STOP NSAIDS (advil, ibuprofen), diclofenac    Do not wear jewelry, make-up or nail polish.  Do not wear lotions, powders, or perfumes. You may wear deodorant.  Do not shave 48 hours prior to surgery. Men may shave face and neck.  Do not bring valuables to the hospital.  [facility] is not responsible                  for any belongings or valuables.               Contacts, dentures or bridgework may not be worn into surgery.  Leave suitcase in the car. After surgery it may be brought to your room.  For patients admitted to the hospital, discharge time is determined by your                treatment team.               Patients discharged the day of surgery will not be allowed to drive  home.  Name and phone number of your driver:   Special Instructions: preparing for surgery   Please read over the following fact sheets that you were given: Pain Booklet, Coughing and Deep Breathing, Blood Transfusion Information and Surgical Site Infection Prevention

## 2015-05-08 MED ORDER — CEFUROXIME SODIUM 1.5 G IJ SOLR
1.5000 g | INTRAMUSCULAR | Status: AC
  Administered 2015-05-09: 1.5 g via INTRAVENOUS
  Filled 2015-05-08: qty 1.5

## 2015-05-08 NOTE — Progress Notes (Signed)
Pt stated that she was made aware to arrive at 11:30 AM on DOS by a staff member at Dr. Leonarda Salon office.

## 2015-05-09 ENCOUNTER — Inpatient Hospital Stay (HOSPITAL_COMMUNITY): Payer: Medicare Other | Admitting: Anesthesiology

## 2015-05-09 ENCOUNTER — Encounter (HOSPITAL_COMMUNITY)
Admission: RE | Disposition: A | Discharge status: Home / Self Care | Payer: Self-pay | Source: Ambulatory Visit | Attending: Thoracic Surgery (Cardiothoracic Vascular Surgery)

## 2015-05-09 ENCOUNTER — Encounter (HOSPITAL_COMMUNITY): Payer: Self-pay | Admitting: *Deleted

## 2015-05-09 ENCOUNTER — Inpatient Hospital Stay (HOSPITAL_COMMUNITY): Payer: Medicare Other

## 2015-05-09 ENCOUNTER — Inpatient Hospital Stay (HOSPITAL_COMMUNITY)
Admission: RE | Admit: 2015-05-09 | Discharge: 2015-05-18 | DRG: 163 | Disposition: A | Discharge status: Home / Self Care | Payer: Medicare Other | Source: Ambulatory Visit | Attending: Thoracic Surgery (Cardiothoracic Vascular Surgery) | Admitting: Thoracic Surgery (Cardiothoracic Vascular Surgery)

## 2015-05-09 DIAGNOSIS — R2689 Other abnormalities of gait and mobility: Secondary | ICD-10-CM | POA: Diagnosis present

## 2015-05-09 DIAGNOSIS — K5900 Constipation, unspecified: Secondary | ICD-10-CM | POA: Diagnosis not present

## 2015-05-09 DIAGNOSIS — R918 Other nonspecific abnormal finding of lung field: Secondary | ICD-10-CM | POA: Diagnosis not present

## 2015-05-09 DIAGNOSIS — M1990 Unspecified osteoarthritis, unspecified site: Secondary | ICD-10-CM | POA: Diagnosis present

## 2015-05-09 DIAGNOSIS — R600 Localized edema: Secondary | ICD-10-CM | POA: Diagnosis present

## 2015-05-09 DIAGNOSIS — F17210 Nicotine dependence, cigarettes, uncomplicated: Secondary | ICD-10-CM | POA: Diagnosis present

## 2015-05-09 DIAGNOSIS — K449 Diaphragmatic hernia without obstruction or gangrene: Secondary | ICD-10-CM | POA: Diagnosis present

## 2015-05-09 DIAGNOSIS — R112 Nausea with vomiting, unspecified: Secondary | ICD-10-CM | POA: Diagnosis not present

## 2015-05-09 DIAGNOSIS — M14679 Charcot's joint, unspecified ankle and foot: Secondary | ICD-10-CM | POA: Diagnosis present

## 2015-05-09 DIAGNOSIS — I959 Hypotension, unspecified: Secondary | ICD-10-CM | POA: Diagnosis not present

## 2015-05-09 DIAGNOSIS — G629 Polyneuropathy, unspecified: Secondary | ICD-10-CM | POA: Diagnosis present

## 2015-05-09 DIAGNOSIS — Z800 Family history of malignant neoplasm of digestive organs: Secondary | ICD-10-CM

## 2015-05-09 DIAGNOSIS — K259 Gastric ulcer, unspecified as acute or chronic, without hemorrhage or perforation: Secondary | ICD-10-CM | POA: Insufficient documentation

## 2015-05-09 DIAGNOSIS — J9811 Atelectasis: Secondary | ICD-10-CM | POA: Diagnosis not present

## 2015-05-09 DIAGNOSIS — Z6831 Body mass index (BMI) 31.0-31.9, adult: Secondary | ICD-10-CM

## 2015-05-09 DIAGNOSIS — Z9889 Other specified postprocedural states: Secondary | ICD-10-CM | POA: Diagnosis not present

## 2015-05-09 DIAGNOSIS — Z902 Acquired absence of lung [part of]: Secondary | ICD-10-CM | POA: Diagnosis not present

## 2015-05-09 DIAGNOSIS — E559 Vitamin D deficiency, unspecified: Secondary | ICD-10-CM | POA: Diagnosis present

## 2015-05-09 DIAGNOSIS — C3412 Malignant neoplasm of upper lobe, left bronchus or lung: Principal | ICD-10-CM | POA: Diagnosis present

## 2015-05-09 DIAGNOSIS — Z8249 Family history of ischemic heart disease and other diseases of the circulatory system: Secondary | ICD-10-CM

## 2015-05-09 DIAGNOSIS — K208 Other esophagitis: Secondary | ICD-10-CM | POA: Diagnosis present

## 2015-05-09 DIAGNOSIS — K264 Chronic or unspecified duodenal ulcer with hemorrhage: Secondary | ICD-10-CM | POA: Diagnosis not present

## 2015-05-09 DIAGNOSIS — R7309 Other abnormal glucose: Secondary | ICD-10-CM | POA: Diagnosis present

## 2015-05-09 DIAGNOSIS — R911 Solitary pulmonary nodule: Secondary | ICD-10-CM | POA: Diagnosis not present

## 2015-05-09 DIAGNOSIS — T39395A Adverse effect of other nonsteroidal anti-inflammatory drugs [NSAID], initial encounter: Secondary | ICD-10-CM | POA: Diagnosis not present

## 2015-05-09 DIAGNOSIS — Z452 Encounter for adjustment and management of vascular access device: Secondary | ICD-10-CM | POA: Diagnosis not present

## 2015-05-09 DIAGNOSIS — C3490 Malignant neoplasm of unspecified part of unspecified bronchus or lung: Secondary | ICD-10-CM | POA: Diagnosis not present

## 2015-05-09 DIAGNOSIS — Z85118 Personal history of other malignant neoplasm of bronchus and lung: Secondary | ICD-10-CM | POA: Diagnosis not present

## 2015-05-09 DIAGNOSIS — Z96651 Presence of right artificial knee joint: Secondary | ICD-10-CM | POA: Diagnosis present

## 2015-05-09 DIAGNOSIS — E785 Hyperlipidemia, unspecified: Secondary | ICD-10-CM | POA: Diagnosis present

## 2015-05-09 DIAGNOSIS — E871 Hypo-osmolality and hyponatremia: Secondary | ICD-10-CM | POA: Diagnosis not present

## 2015-05-09 DIAGNOSIS — J984 Other disorders of lung: Secondary | ICD-10-CM | POA: Diagnosis not present

## 2015-05-09 DIAGNOSIS — M25552 Pain in left hip: Secondary | ICD-10-CM | POA: Diagnosis present

## 2015-05-09 DIAGNOSIS — Z791 Long term (current) use of non-steroidal anti-inflammatories (NSAID): Secondary | ICD-10-CM

## 2015-05-09 DIAGNOSIS — K922 Gastrointestinal hemorrhage, unspecified: Secondary | ICD-10-CM | POA: Diagnosis not present

## 2015-05-09 DIAGNOSIS — Z981 Arthrodesis status: Secondary | ICD-10-CM | POA: Diagnosis not present

## 2015-05-09 DIAGNOSIS — E0590 Thyrotoxicosis, unspecified without thyrotoxic crisis or storm: Secondary | ICD-10-CM | POA: Diagnosis present

## 2015-05-09 DIAGNOSIS — K209 Esophagitis, unspecified without bleeding: Secondary | ICD-10-CM | POA: Insufficient documentation

## 2015-05-09 DIAGNOSIS — D62 Acute posthemorrhagic anemia: Secondary | ICD-10-CM | POA: Diagnosis not present

## 2015-05-09 DIAGNOSIS — Z803 Family history of malignant neoplasm of breast: Secondary | ICD-10-CM

## 2015-05-09 DIAGNOSIS — E669 Obesity, unspecified: Secondary | ICD-10-CM | POA: Diagnosis present

## 2015-05-09 DIAGNOSIS — J929 Pleural plaque without asbestos: Secondary | ICD-10-CM | POA: Diagnosis not present

## 2015-05-09 DIAGNOSIS — R0489 Hemorrhage from other sites in respiratory passages: Secondary | ICD-10-CM | POA: Diagnosis not present

## 2015-05-09 DIAGNOSIS — I10 Essential (primary) hypertension: Secondary | ICD-10-CM | POA: Diagnosis present

## 2015-05-09 DIAGNOSIS — B379 Candidiasis, unspecified: Secondary | ICD-10-CM | POA: Diagnosis present

## 2015-05-09 DIAGNOSIS — J939 Pneumothorax, unspecified: Secondary | ICD-10-CM | POA: Diagnosis not present

## 2015-05-09 DIAGNOSIS — R1110 Vomiting, unspecified: Secondary | ICD-10-CM

## 2015-05-09 HISTORY — PX: LYMPH NODE DISSECTION: SHX5087

## 2015-05-09 HISTORY — PX: LOBECTOMY: SHX5089

## 2015-05-09 HISTORY — PX: CRYO INTERCOSTAL NERVE BLOCK: SHX6522

## 2015-05-09 HISTORY — PX: VIDEO ASSISTED THORACOSCOPY (VATS)/WEDGE RESECTION: SHX6174

## 2015-05-09 LAB — BLOOD GAS, ARTERIAL
Acid-Base Excess: 0.2 mmol/L (ref 0.0–2.0)
BICARBONATE: 24.3 meq/L — AB (ref 20.0–24.0)
Drawn by: 428831
FIO2: 0.21 %
O2 Saturation: 97.9 %
PCO2 ART: 39.4 mmHg (ref 35.0–45.0)
PH ART: 7.407 (ref 7.350–7.450)
PO2 ART: 109 mmHg — AB (ref 80.0–100.0)
Patient temperature: 98.6
TCO2: 25.5 mmol/L (ref 0–100)

## 2015-05-09 LAB — POCT I-STAT 7, (LYTES, BLD GAS, ICA,H+H)
ACID-BASE EXCESS: 3 mmol/L — AB (ref 0.0–2.0)
BICARBONATE: 28 meq/L — AB (ref 20.0–24.0)
Calcium, Ion: 1.22 mmol/L (ref 1.13–1.30)
HEMATOCRIT: 36 % (ref 36.0–46.0)
Hemoglobin: 12.2 g/dL (ref 12.0–15.0)
O2 SAT: 100 %
Patient temperature: 35.5
Potassium: 4.4 mmol/L (ref 3.5–5.1)
SODIUM: 132 mmol/L — AB (ref 135–145)
TCO2: 29 mmol/L (ref 0–100)
pCO2 arterial: 42.8 mmHg (ref 35.0–45.0)
pH, Arterial: 7.417 (ref 7.350–7.450)
pO2, Arterial: 249 mmHg — ABNORMAL HIGH (ref 80.0–100.0)

## 2015-05-09 SURGERY — VIDEO ASSISTED THORACOSCOPY (VATS)/WEDGE RESECTION
Anesthesia: General | Site: Chest | Laterality: Left

## 2015-05-09 MED ORDER — ROCURONIUM BROMIDE 50 MG/5ML IV SOLN
INTRAVENOUS | Status: AC
  Filled 2015-05-09: qty 1

## 2015-05-09 MED ORDER — SODIUM CHLORIDE 0.9 % IJ SOLN: 9.0000 mL | INTRAMUSCULAR | Status: DC | PRN

## 2015-05-09 MED ORDER — ONDANSETRON HCL 4 MG/2ML IJ SOLN
4.0000 mg | Freq: Four times a day (QID) | INTRAMUSCULAR | Status: DC | PRN
  Administered 2015-05-12 – 2015-05-14 (×4): 4 mg via INTRAVENOUS
  Filled 2015-05-09 (×4): qty 2

## 2015-05-09 MED ORDER — 0.9 % SODIUM CHLORIDE (POUR BTL) OPTIME
TOPICAL | Status: DC | PRN
  Administered 2015-05-09: 2000 mL

## 2015-05-09 MED ORDER — PROPOFOL 10 MG/ML IV BOLUS
INTRAVENOUS | Status: AC
  Filled 2015-05-09: qty 20

## 2015-05-09 MED ORDER — FENTANYL CITRATE (PF) 250 MCG/5ML IJ SOLN
INTRAMUSCULAR | Status: AC
  Filled 2015-05-09: qty 5

## 2015-05-09 MED ORDER — PROPOFOL 10 MG/ML IV BOLUS
INTRAVENOUS | Status: DC | PRN
  Administered 2015-05-09: 150 mg via INTRAVENOUS

## 2015-05-09 MED ORDER — PRAVASTATIN SODIUM 40 MG PO TABS
40.0000 mg | ORAL_TABLET | Freq: Every day | ORAL | Status: DC
  Administered 2015-05-09 – 2015-05-17 (×8): 40 mg via ORAL
  Filled 2015-05-09 (×10): qty 1

## 2015-05-09 MED ORDER — PHENYLEPHRINE HCL 10 MG/ML IJ SOLN
INTRAMUSCULAR | Status: DC | PRN
  Administered 2015-05-09 (×2): 80 ug via INTRAVENOUS

## 2015-05-09 MED ORDER — MIDAZOLAM HCL 2 MG/2ML IJ SOLN
INTRAMUSCULAR | Status: AC
  Filled 2015-05-09: qty 2

## 2015-05-09 MED ORDER — LACTATED RINGERS IV SOLN
INTRAVENOUS | Status: DC | PRN
  Administered 2015-05-09 (×2): via INTRAVENOUS

## 2015-05-09 MED ORDER — GLYCOPYRROLATE 0.2 MG/ML IJ SOLN
INTRAMUSCULAR | Status: AC
  Filled 2015-05-09: qty 2

## 2015-05-09 MED ORDER — FENTANYL CITRATE (PF) 100 MCG/2ML IJ SOLN
100.0000 ug | Freq: Once | INTRAMUSCULAR | Status: AC
  Administered 2015-05-09: 100 ug via INTRAVENOUS

## 2015-05-09 MED ORDER — OXYCODONE HCL 5 MG/5ML PO SOLN: 5.0000 mg | Freq: Once | ORAL | Status: DC | PRN

## 2015-05-09 MED ORDER — ONDANSETRON HCL 4 MG/2ML IJ SOLN
4.0000 mg | Freq: Four times a day (QID) | INTRAMUSCULAR | Status: DC | PRN
  Administered 2015-05-12: 4 mg via INTRAVENOUS
  Filled 2015-05-09 (×2): qty 2

## 2015-05-09 MED ORDER — BISACODYL 5 MG PO TBEC
10.0000 mg | DELAYED_RELEASE_TABLET | Freq: Every day | ORAL | Status: DC
  Administered 2015-05-09 – 2015-05-11 (×3): 10 mg via ORAL
  Filled 2015-05-09 (×3): qty 2

## 2015-05-09 MED ORDER — CETYLPYRIDINIUM CHLORIDE 0.05 % MT LIQD
7.0000 mL | Freq: Two times a day (BID) | OROMUCOSAL | Status: DC
  Administered 2015-05-10 – 2015-05-18 (×16): 7 mL via OROMUCOSAL

## 2015-05-09 MED ORDER — MIDAZOLAM HCL 2 MG/2ML IJ SOLN
INTRAMUSCULAR | Status: AC
  Administered 2015-05-09: 2 mg
  Filled 2015-05-09: qty 2

## 2015-05-09 MED ORDER — BUPROPION HCL ER (SR) 150 MG PO TB12
150.0000 mg | ORAL_TABLET | Freq: Two times a day (BID) | ORAL | Status: DC
  Administered 2015-05-10 – 2015-05-18 (×14): 150 mg via ORAL
  Filled 2015-05-09 (×20): qty 1

## 2015-05-09 MED ORDER — KCL IN DEXTROSE-NACL 20-5-0.45 MEQ/L-%-% IV SOLN
INTRAVENOUS | Status: DC
  Administered 2015-05-09 – 2015-05-11 (×5): via INTRAVENOUS
  Administered 2015-05-12: 1000 mL via INTRAVENOUS
  Administered 2015-05-13 – 2015-05-17 (×4): via INTRAVENOUS
  Filled 2015-05-09 (×19): qty 1000

## 2015-05-09 MED ORDER — FENTANYL CITRATE (PF) 100 MCG/2ML IJ SOLN
INTRAMUSCULAR | Status: AC
  Filled 2015-05-09: qty 2

## 2015-05-09 MED ORDER — LACTATED RINGERS IV SOLN
INTRAVENOUS | Status: DC
  Administered 2015-05-09 (×3): via INTRAVENOUS

## 2015-05-09 MED ORDER — MIDAZOLAM HCL 5 MG/ML IJ SOLN
2.0000 mg | Freq: Once | INTRAMUSCULAR | Status: AC
  Administered 2015-05-09 (×2): 1 mg via INTRAVENOUS

## 2015-05-09 MED ORDER — ONDANSETRON HCL 4 MG/2ML IJ SOLN: 4.0000 mg | Freq: Four times a day (QID) | INTRAMUSCULAR | Status: DC | PRN

## 2015-05-09 MED ORDER — ONDANSETRON HCL 4 MG/2ML IJ SOLN
INTRAMUSCULAR | Status: AC
  Filled 2015-05-09: qty 2

## 2015-05-09 MED ORDER — DIPHENHYDRAMINE HCL 12.5 MG/5ML PO ELIX
12.5000 mg | ORAL_SOLUTION | Freq: Four times a day (QID) | ORAL | Status: DC | PRN
  Filled 2015-05-09: qty 5

## 2015-05-09 MED ORDER — HEMOSTATIC AGENTS (NO CHARGE) OPTIME
TOPICAL | Status: DC | PRN
  Administered 2015-05-09: 1 via TOPICAL

## 2015-05-09 MED ORDER — KCL IN DEXTROSE-NACL 20-5-0.45 MEQ/L-%-% IV SOLN
INTRAVENOUS | Status: AC
  Filled 2015-05-09: qty 1000

## 2015-05-09 MED ORDER — FENTANYL 10 MCG/ML IV SOLN
INTRAVENOUS | Status: DC
  Administered 2015-05-09: 19:00:00 via INTRAVENOUS
  Administered 2015-05-10: 50 ug via INTRAVENOUS
  Administered 2015-05-10: 140 ug via INTRAVENOUS
  Administered 2015-05-10: 90 ug via INTRAVENOUS
  Administered 2015-05-10: 70 ug via INTRAVENOUS
  Administered 2015-05-10: 150 ug via INTRAVENOUS
  Administered 2015-05-10: 50 ug via INTRAVENOUS
  Administered 2015-05-10: 17:00:00 via INTRAVENOUS
  Administered 2015-05-11: 60 ug via INTRAVENOUS
  Administered 2015-05-11: 100 ug via INTRAVENOUS
  Administered 2015-05-11: 70 ug via INTRAVENOUS
  Administered 2015-05-11: 90 ug via INTRAVENOUS
  Administered 2015-05-11: 70 ug via INTRAVENOUS
  Administered 2015-05-11: 11:00:00 via INTRAVENOUS
  Administered 2015-05-11: 150 ug via INTRAVENOUS
  Administered 2015-05-12: 10 ug via INTRAVENOUS
  Administered 2015-05-12: 110 ug via INTRAVENOUS
  Administered 2015-05-12: 20 ug via INTRAVENOUS
  Administered 2015-05-13: 40 ug via INTRAVENOUS
  Filled 2015-05-09 (×4): qty 50

## 2015-05-09 MED ORDER — TRAMADOL HCL 50 MG PO TABS
50.0000 mg | ORAL_TABLET | Freq: Four times a day (QID) | ORAL | Status: DC | PRN
  Administered 2015-05-15 – 2015-05-18 (×5): 100 mg via ORAL
  Filled 2015-05-09 (×5): qty 2

## 2015-05-09 MED ORDER — LIDOCAINE HCL (CARDIAC) 20 MG/ML IV SOLN
INTRAVENOUS | Status: AC
  Filled 2015-05-09: qty 5

## 2015-05-09 MED ORDER — NALOXONE HCL 0.4 MG/ML IJ SOLN
0.4000 mg | INTRAMUSCULAR | Status: DC | PRN
  Filled 2015-05-09: qty 1

## 2015-05-09 MED ORDER — OXYCODONE HCL 5 MG PO TABS
5.0000 mg | ORAL_TABLET | ORAL | Status: DC | PRN
  Administered 2015-05-09: 10 mg via ORAL
  Filled 2015-05-09: qty 2

## 2015-05-09 MED ORDER — NEOSTIGMINE METHYLSULFATE 10 MG/10ML IV SOLN
INTRAVENOUS | Status: DC | PRN
  Administered 2015-05-09: 3 mg via INTRAVENOUS

## 2015-05-09 MED ORDER — ENALAPRIL MALEATE 20 MG PO TABS
20.0000 mg | ORAL_TABLET | Freq: Every day | ORAL | Status: DC
  Administered 2015-05-09 – 2015-05-10 (×2): 20 mg via ORAL
  Filled 2015-05-09 (×2): qty 1

## 2015-05-09 MED ORDER — ONDANSETRON HCL 4 MG/2ML IJ SOLN
INTRAMUSCULAR | Status: DC | PRN
  Administered 2015-05-09: 4 mg via INTRAVENOUS

## 2015-05-09 MED ORDER — FENTANYL CITRATE (PF) 250 MCG/5ML IJ SOLN
INTRAMUSCULAR | Status: DC | PRN
  Administered 2015-05-09: 100 ug via INTRAVENOUS
  Administered 2015-05-09 (×5): 50 ug via INTRAVENOUS
  Administered 2015-05-09: 100 ug via INTRAVENOUS
  Administered 2015-05-09: 50 ug via INTRAVENOUS
  Administered 2015-05-09: 100 ug via INTRAVENOUS

## 2015-05-09 MED ORDER — SENNOSIDES-DOCUSATE SODIUM 8.6-50 MG PO TABS
1.0000 | ORAL_TABLET | Freq: Every day | ORAL | Status: DC
  Administered 2015-05-09 – 2015-05-10 (×2): 1 via ORAL
  Filled 2015-05-09 (×4): qty 1

## 2015-05-09 MED ORDER — DIPHENHYDRAMINE HCL 50 MG/ML IJ SOLN
12.5000 mg | Freq: Four times a day (QID) | INTRAMUSCULAR | Status: DC | PRN
  Filled 2015-05-09: qty 0.25

## 2015-05-09 MED ORDER — HYDROMORPHONE HCL 1 MG/ML IJ SOLN
0.2500 mg | INTRAMUSCULAR | Status: DC | PRN
  Administered 2015-05-09 (×2): 0.5 mg via INTRAVENOUS

## 2015-05-09 MED ORDER — DEXTROSE 5 % IV SOLN
1.5000 g | Freq: Two times a day (BID) | INTRAVENOUS | Status: AC
  Administered 2015-05-10 (×2): 1.5 g via INTRAVENOUS
  Filled 2015-05-09 (×2): qty 1.5

## 2015-05-09 MED ORDER — POTASSIUM CHLORIDE 10 MEQ/50ML IV SOLN
10.0000 meq | Freq: Every day | INTRAVENOUS | Status: DC | PRN
  Administered 2015-05-15 (×3): 10 meq via INTRAVENOUS
  Filled 2015-05-09 (×3): qty 50

## 2015-05-09 MED ORDER — GLYCOPYRROLATE 0.2 MG/ML IJ SOLN
INTRAMUSCULAR | Status: DC | PRN
  Administered 2015-05-09: 0.4 mg via INTRAVENOUS

## 2015-05-09 MED ORDER — HYDROMORPHONE HCL 1 MG/ML IJ SOLN
INTRAMUSCULAR | Status: AC
  Administered 2015-05-09: 0.5 mg
  Filled 2015-05-09: qty 1

## 2015-05-09 MED ORDER — OXYCODONE HCL 5 MG PO TABS: 5.0000 mg | ORAL_TABLET | Freq: Once | ORAL | Status: DC | PRN

## 2015-05-09 MED ORDER — ROCURONIUM BROMIDE 100 MG/10ML IV SOLN
INTRAVENOUS | Status: DC | PRN
  Administered 2015-05-09: 5 mg via INTRAVENOUS
  Administered 2015-05-09 (×2): 10 mg via INTRAVENOUS
  Administered 2015-05-09: 50 mg via INTRAVENOUS

## 2015-05-09 MED ORDER — NEOSTIGMINE METHYLSULFATE 10 MG/10ML IV SOLN
INTRAVENOUS | Status: AC
  Filled 2015-05-09: qty 1

## 2015-05-09 MED ORDER — ATENOLOL 50 MG PO TABS
50.0000 mg | ORAL_TABLET | Freq: Every day | ORAL | Status: DC
  Administered 2015-05-10 – 2015-05-18 (×7): 50 mg via ORAL
  Filled 2015-05-09 (×10): qty 1

## 2015-05-09 MED ORDER — DEXAMETHASONE SODIUM PHOSPHATE 4 MG/ML IJ SOLN
INTRAMUSCULAR | Status: DC | PRN
  Administered 2015-05-09: 4 mg via INTRAVENOUS

## 2015-05-09 MED ORDER — ACETAMINOPHEN 160 MG/5ML PO SOLN
1000.0000 mg | Freq: Four times a day (QID) | ORAL | Status: AC
  Administered 2015-05-13 (×2): 1000 mg via ORAL
  Filled 2015-05-09 (×20): qty 40

## 2015-05-09 MED ORDER — ACETAMINOPHEN 500 MG PO TABS
1000.0000 mg | ORAL_TABLET | Freq: Four times a day (QID) | ORAL | Status: AC
  Administered 2015-05-10 – 2015-05-14 (×10): 1000 mg via ORAL
  Filled 2015-05-09 (×21): qty 2

## 2015-05-09 SURGICAL SUPPLY — 98 items
APPLIER CLIP 5 13 M/L LIGAMAX5 (MISCELLANEOUS) ×3
BENZOIN TINCTURE PRP APPL 2/3 (GAUZE/BANDAGES/DRESSINGS) IMPLANT
CANISTER SUCTION 2500CC (MISCELLANEOUS) ×6 IMPLANT
CATH KIT ON Q 5IN SLV (PAIN MANAGEMENT) IMPLANT
CATH THORACIC 28FR (CATHETERS) ×3 IMPLANT
CATH THORACIC 36FR (CATHETERS) IMPLANT
CATH THORACIC 36FR RT ANG (CATHETERS) IMPLANT
CLIP APPLIE 5 13 M/L LIGAMAX5 (MISCELLANEOUS) ×1 IMPLANT
CLIP TI MEDIUM 6 (CLIP) ×3 IMPLANT
CONN ST 1/4X3/8  BEN (MISCELLANEOUS) ×2
CONN ST 1/4X3/8 BEN (MISCELLANEOUS) ×1 IMPLANT
CONN Y 3/8X3/8X3/8  BEN (MISCELLANEOUS)
CONN Y 3/8X3/8X3/8 BEN (MISCELLANEOUS) IMPLANT
CONT SPEC 4OZ CLIKSEAL STRL BL (MISCELLANEOUS) ×27 IMPLANT
COVER SURGICAL LIGHT HANDLE (MISCELLANEOUS) ×3 IMPLANT
DERMABOND ADVANCED (GAUZE/BANDAGES/DRESSINGS) ×2
DERMABOND ADVANCED .7 DNX12 (GAUZE/BANDAGES/DRESSINGS) ×1 IMPLANT
DISSECTOR BLUNT TIP ENDO 5MM (MISCELLANEOUS) ×3 IMPLANT
DRAIN CHANNEL 28F RND 3/8 FF (WOUND CARE) ×3 IMPLANT
DRAIN CHANNEL 32F RND 10.7 FF (WOUND CARE) IMPLANT
DRAPE LAPAROSCOPIC ABDOMINAL (DRAPES) ×3 IMPLANT
DRAPE WARM FLUID 44X44 (DRAPE) ×3 IMPLANT
ELECT BLADE 6.5 EXT (BLADE) ×6 IMPLANT
ELECT REM PT RETURN 9FT ADLT (ELECTROSURGICAL) ×3
ELECTRODE REM PT RTRN 9FT ADLT (ELECTROSURGICAL) ×1 IMPLANT
GAUZE SPONGE 4X4 12PLY STRL (GAUZE/BANDAGES/DRESSINGS) ×3 IMPLANT
GLOVE BIO SURGEON STRL SZ 6 (GLOVE) ×3 IMPLANT
GLOVE BIO SURGEON STRL SZ 6.5 (GLOVE) ×2 IMPLANT
GLOVE BIO SURGEONS STRL SZ 6.5 (GLOVE) ×1
GLOVE BIOGEL PI IND STRL 7.0 (GLOVE) ×2 IMPLANT
GLOVE BIOGEL PI INDICATOR 7.0 (GLOVE) ×4
GLOVE SURG SIGNA 7.5 PF LTX (GLOVE) ×6 IMPLANT
GLOVE SURG SS PI 7.0 STRL IVOR (GLOVE) ×9 IMPLANT
GOWN STRL REUS W/ TWL LRG LVL3 (GOWN DISPOSABLE) ×3 IMPLANT
GOWN STRL REUS W/ TWL XL LVL3 (GOWN DISPOSABLE) ×2 IMPLANT
GOWN STRL REUS W/TWL LRG LVL3 (GOWN DISPOSABLE) ×6
GOWN STRL REUS W/TWL XL LVL3 (GOWN DISPOSABLE) ×4
HANDLE STAPLE ENDO GIA SHORT (STAPLE)
HEMOSTAT SURGICEL 2X14 (HEMOSTASIS) ×3 IMPLANT
KIT BASIN OR (CUSTOM PROCEDURE TRAY) ×3 IMPLANT
KIT ROOM TURNOVER OR (KITS) ×3 IMPLANT
KIT SUCTION CATH 14FR (SUCTIONS) ×3 IMPLANT
NS IRRIG 1000ML POUR BTL (IV SOLUTION) ×12 IMPLANT
PACK CHEST (CUSTOM PROCEDURE TRAY) ×3 IMPLANT
PAD ARMBOARD 7.5X6 YLW CONV (MISCELLANEOUS) ×6 IMPLANT
PENCIL BUTTON HOLSTER BLD 10FT (ELECTRODE) ×3 IMPLANT
POUCH ENDO CATCH II 15MM (MISCELLANEOUS) ×3 IMPLANT
POUCH SPECIMEN RETRIEVAL 10MM (ENDOMECHANICALS) ×3 IMPLANT
PROBE CRYO2-ABLATION MALLABLE (MISCELLANEOUS) ×3 IMPLANT
RELOAD GOLD (STAPLE) ×27 IMPLANT
RELOAD GREEN (STAPLE) ×3 IMPLANT
SCISSORS ENDO CVD 5DCS (MISCELLANEOUS) IMPLANT
SEALANT PROGEL (MISCELLANEOUS) IMPLANT
SEALANT SURG COSEAL 4ML (VASCULAR PRODUCTS) IMPLANT
SEALANT SURG COSEAL 8ML (VASCULAR PRODUCTS) IMPLANT
SOLUTION ANTI FOG 6CC (MISCELLANEOUS) ×3 IMPLANT
SPECIMEN JAR MEDIUM (MISCELLANEOUS) ×3 IMPLANT
SPONGE GAUZE 4X4 12PLY STER LF (GAUZE/BANDAGES/DRESSINGS) ×3 IMPLANT
SPONGE INTESTINAL PEANUT (DISPOSABLE) ×12 IMPLANT
SPONGE TONSIL 1 RF SGL (DISPOSABLE) ×3 IMPLANT
STAPLE ECHEON FLEX 60 POW ENDO (STAPLE) ×3 IMPLANT
STAPLE RELOAD 2.5MM WHITE (STAPLE) ×18 IMPLANT
STAPLER ENDO GIA 12MM SHORT (STAPLE) IMPLANT
STAPLER VASCULAR ECHELON 35 (CUTTER) ×3 IMPLANT
SUT PROLENE 4 0 RB 1 (SUTURE)
SUT PROLENE 4-0 RB1 .5 CRCL 36 (SUTURE) IMPLANT
SUT SILK  1 MH (SUTURE) ×6
SUT SILK 1 MH (SUTURE) ×3 IMPLANT
SUT SILK 1 TIES 10X30 (SUTURE) ×3 IMPLANT
SUT SILK 2 0 SH (SUTURE) IMPLANT
SUT SILK 2 0SH CR/8 30 (SUTURE) IMPLANT
SUT SILK 3 0 SH 30 (SUTURE) IMPLANT
SUT SILK 3 0SH CR/8 30 (SUTURE) IMPLANT
SUT VIC AB 0 CTX 27 (SUTURE) IMPLANT
SUT VIC AB 1 CTX 27 (SUTURE) IMPLANT
SUT VIC AB 2-0 CT1 27 (SUTURE) ×4
SUT VIC AB 2-0 CT1 TAPERPNT 27 (SUTURE) ×2 IMPLANT
SUT VIC AB 2-0 CTX 36 (SUTURE) IMPLANT
SUT VIC AB 3-0 MH 27 (SUTURE) IMPLANT
SUT VIC AB 3-0 SH 27 (SUTURE)
SUT VIC AB 3-0 SH 27X BRD (SUTURE) IMPLANT
SUT VIC AB 3-0 X1 27 (SUTURE) ×3 IMPLANT
SUT VICRYL 0 UR6 27IN ABS (SUTURE) IMPLANT
SUT VICRYL 2 TP 1 (SUTURE) IMPLANT
SWAB COLLECTION DEVICE MRSA (MISCELLANEOUS) IMPLANT
SYSTEM SAHARA CHEST DRAIN ATS (WOUND CARE) ×3 IMPLANT
TAPE CLOTH SURG 4X10 WHT LF (GAUZE/BANDAGES/DRESSINGS) ×3 IMPLANT
TIP APPLICATOR SPRAY EXTEND 16 (VASCULAR PRODUCTS) IMPLANT
TOWEL OR 17X24 6PK STRL BLUE (TOWEL DISPOSABLE) ×3 IMPLANT
TOWEL OR 17X26 10 PK STRL BLUE (TOWEL DISPOSABLE) ×6 IMPLANT
TRAP SPECIMEN MUCOUS 40CC (MISCELLANEOUS) IMPLANT
TRAY FOLEY W/METER SILVER 16FR (SET/KITS/TRAYS/PACK) ×3 IMPLANT
TROCAR FLEXIPATH THORACIC 15MM (ENDOMECHANICALS) ×3 IMPLANT
TROCAR XCEL BLADELESS 5X75MML (TROCAR) ×3 IMPLANT
TROCAR XCEL NON-BLD 5MMX100MML (ENDOMECHANICALS) IMPLANT
TUBE ANAEROBIC SPECIMEN COL (MISCELLANEOUS) IMPLANT
TUNNELER SHEATH ON-Q 11GX8 DSP (PAIN MANAGEMENT) IMPLANT
WATER STERILE IRR 1000ML POUR (IV SOLUTION) ×6 IMPLANT

## 2015-05-09 NOTE — Transfer of Care (Signed)
Immediate Anesthesia Transfer of Care Note  Patient: Tiffany [name]  Procedure(s) Performed: Procedure(s): LEFT VIDEO ASSISTED THORACOSCOPY (VATS) WITH LEFT LUNG UPPER LOBE WEDGE RESECTION (Left) LEFT LUNG UPPER LOBECTOMY (Left) CRYO INTERCOSTAL NERVE BLOCK (Left) LYMPH NODE DISSECTION (Left)  Patient Location: PACU  Anesthesia Type:General  Level of Consciousness: awake and sedated  Airway & Oxygen Therapy: Patient Spontanous Breathing and Patient connected to face mask oxygen  Post-op Assessment: Report given to RN, Post -op Vital signs reviewed and stable and Patient moving all extremities  Post vital signs: Reviewed and stable  Last Vitals:  Filed Vitals:   05/09/15 1145  BP: 128/59  Pulse: 68  Temp: 36.8 C  Resp: 18    Complications: No apparent anesthesia complications

## 2015-05-09 NOTE — Anesthesia Procedure Notes (Signed)
Procedure Name: Intubation Date/Time: 05/09/2015 1:53 PM Performed by: Ollen Bowl Pre-anesthesia Checklist: Patient identified, Emergency Drugs available, Suction available, Patient being monitored and Timeout performed Patient Re-evaluated:Patient Re-evaluated prior to inductionOxygen Delivery Method: Circle system utilized and Simple face mask Preoxygenation: Pre-oxygenation with 100% oxygen Intubation Type: IV induction Ventilation: Mask ventilation without difficulty Laryngoscope Size: Mac and 3 Grade View: Grade I Endobronchial tube: Left, EBT position confirmed by fiberoptic bronchoscope and EBT position confirmed by auscultation and 39 Fr Number of attempts: 1 Airway Equipment and Method: Patient positioned with wedge pillow and Stylet Placement Confirmation: ETT inserted through vocal cords under direct vision,  positive ETCO2 and breath sounds checked- equal and bilateral Secured at: 29 cm Tube secured with: Tape Dental Injury: Teeth and Oropharynx as per pre-operative assessment

## 2015-05-09 NOTE — Interval H&P Note (Signed)
History and Physical Interval Note:  LE duplex negative for DVT 05/09/2015 1:21 PM  Tiffany [name]  has presented today for surgery, with the diagnosis of LUL NODULE  The various methods of treatment have been discussed with the patient and family. After consideration of risks, benefits and other options for treatment, the patient has consented to  Procedure(s): VIDEO ASSISTED THORACOSCOPY (VATS)/WEDGE RESECTION (Left) POSSIBLE LEFT UPPER LOBECTOMY (Left) CRYO INTERCOSTAL NERVE BLOCK (Left) as a surgical intervention .  The patient's history has been reviewed, patient examined, no change in status, stable for surgery.  I have reviewed the patient's chart and labs.  Questions were answered to the patient's satisfaction.     [name]

## 2015-05-09 NOTE — H&P (View-Only) (Signed)
PCP is Tiffany Richards, MD Referring Provider is Clance, Armando Reichert, MD  Chief Complaint  Patient presents with  . Lung Lesion    CT CHEST 03/27/15, PET 04/03/15, PFT'S 04/24/15    HPI: 69 year old woman sent for consultation regarding a left upper lobe nodules.  Tiffany [name] is a 69 year old woman who has a past medical history significant for hypertension, neuropathy, tobacco abuse (30 pack years), hyperthyroidism, and arthritis. She saw Dr. Melford Aase in November for an annual checkup. As part of her workup a chest x-ray was done. It showed some nodular densities in the left upper lobe. A follow-up x-ray was done in March which showed the nodules are still present. A CT scan was done which showed 2 nodules, 13 and 14 mm respectively, in close proximity in the left upper lobe. A PET CT showed the lesion were hypermetabolic. There also were multiple rib fractures, left fifth, sixth and seventh ribs. There also was an abnormality noted in the left hip, which was not felt to be metastatic.  She had bronchoscopy and endobronchial ultrasound. The results from those were unrevealing. AFB and fungal stains were negative.  She is still smoking about one half pack of cigarettes daily. She is trying to quit. Physical activity is limited due to neuropathy in her feet a Charcot's joint in her ankle and left hip pain. She does use a walker. She denies chest pain, pressure, and tightness. She has no cardiac history. She denies shortness of breath. She denies cough, hemoptysis, wheezing. Her weight is unchanged over the past 3 months. She has not had any unusual headaches or visual changes. She does not remember any recent trauma involving her chest, but thinks she may have broken ribs when she fractured her neck and had to have spinal fusion in 2008.     Past Medical History  Diagnosis Date  . High blood pressure   . Gait difficulty   . Balance disorder   . Hyperlipidemia   . Pre-diabetes   . Vitamin D  deficiency   . Obesity (BMI 35.0-39.9 without comorbidity)   . Neuropathy   . Hyperthyroidism mild  . Arthritis     Past Surgical History  Procedure Laterality Date  . Vaginal hysterectomy    . Cervical disc arthroplasty    . Rotator cuff repair    . Spinal fusion    . Tonsillectomy    . Knee replaced      right knee x 2  . Video bronchoscopy with endobronchial navigation N/A 04/18/2015    Procedure: VIDEO BRONCHOSCOPY WITH ENDOBRONCHIAL NAVIGATION;  Surgeon: Collene Gobble, MD;  Location: [facility];  Service: Thoracic;  Laterality: N/A;  . Video bronchoscopy with endobronchial ultrasound N/A 04/18/2015    Procedure: VIDEO BRONCHOSCOPY WITH ENDOBRONCHIAL ULTRASOUND;  Surgeon: Collene Gobble, MD;  Location: [facility];  Service: Thoracic;  Laterality: N/A;    Family History  Problem Relation Age of Onset  . Heart attack Father   . Hypertension Mother   . Breast cancer Maternal Aunt   . Colon cancer Paternal Grandmother     Social History History  Substance Use Topics  . Smoking status: Current Every Day Smoker -- 1.00 packs/day for 30 years    Types: Cigarettes  . Smokeless tobacco: Never Used     Comment: currently 1/2 ppd  . Alcohol Use: 0.6 oz/week    1 Glasses of wine per week     Comment: 2 glasses wine daily    Current Outpatient Prescriptions  Medication Sig Dispense Refill  . atenolol (TENORMIN) 100 MG tablet TAKE 1 TABLET BY MOUTH DAILY FOR BLOOD PRESSURE (Patient taking differently: TAKE 1/2 TABLET BY MOUTH DAILY FOR BLOOD PRESSURE) 90 tablet 3  . buPROPion (WELLBUTRIN SR) 150 MG 12 hr tablet TAKE 1 TABLET BY MOUTH TWICE DAILY 60 tablet 2  . Cholecalciferol (VITAMIN D PO) Take 5,000 Units by mouth 2 (two) times daily.     . diclofenac (VOLTAREN) 75 MG EC tablet TAKE 1 TABLET BY MOUTH TWICE DAILY 60 tablet 1  . diphenhydrAMINE (BENADRYL) 25 MG tablet Take 25 mg by mouth every 8 (eight) hours as needed for allergies.    Marland Kitchen enalapril (VASOTEC) 20 MG tablet TAKE 1 TABLET BY  MOUTH DAILY FOR HIGH BLOOD PRESSURE 90 tablet 1  . furosemide (LASIX) 40 MG tablet Take 40 mg by mouth daily.    Marland Kitchen HYDROcodone-acetaminophen (NORCO/VICODIN) 5-325 MG per tablet Take 1 tablet by mouth every 8 (eight) hours as needed for moderate pain.     . Multiple Vitamins-Minerals (MULTIVITAMIN PO) Take by mouth.    . pravastatin (PRAVACHOL) 40 MG tablet TAKE 1 TABLET BY MOUTH EVERY NIGHT AT BEDTIME FOR CHOLESTEROL 90 tablet 1   No current facility-administered medications for this visit.    No Known Allergies  Review of Systems  Constitutional: Positive for activity change (limited due to hip pain and neuropathy). Negative for fever, chills, appetite change and unexpected weight change.  Respiratory: Negative for chest tightness and shortness of breath.   Cardiovascular: Positive for leg swelling (3 weeks). Negative for chest pain.  Endocrine:       Borderline hyperthyroid  Musculoskeletal: Positive for joint swelling, arthralgias, gait problem and neck stiffness.  Neurological: Positive for numbness (feet- "hereditary neuropathy").  Hematological: Bruises/bleeds easily.  All other systems reviewed and are negative.   BP 129/76 mmHg  Pulse 71  Resp 16  Ht '5\' 7"'$  (1.702 m)  Wt 204 lb (92.534 kg)  BMI 31.94 kg/m2  SpO2 99% Physical Exam  Constitutional: She is oriented to person, place, and time. She appears well-developed and well-nourished. No distress.  HENT:  Head: Normocephalic and atraumatic.  Eyes: EOM are normal.  Neck: Thyromegaly present.  Cardiovascular: Normal rate, regular rhythm and normal heart sounds.   No murmur heard. Pulmonary/Chest: Effort normal and breath sounds normal. She has no wheezes. She has no rales.  Abdominal: Soft. There is no tenderness.  Musculoskeletal: She exhibits edema (2+ bilaterally).  Lymphadenopathy:    She has no cervical adenopathy.  Neurological: She is alert and oriented to person, place, and time. No cranial nerve deficit.  No  motor deficit, walks with walker  Skin: Skin is warm and dry.  Vitals reviewed.    Diagnostic Tests: CT CHEST WITH CONTRAST  TECHNIQUE: Multidetector CT imaging of the chest was performed during intravenous contrast administration.  CONTRAST: 75 cc Isovue 300 IV  COMPARISON: Chest x-ray 02/27/2015.  FINDINGS: CT CHEST FINDINGS  Mediastinum/Nodes: No mediastinal, hilar, or axillary adenopathy. Heart is normal size. Aorta is normal caliber. Multiple small bilateral thyroid nodules, most compatible with multinodular goiter.  Lungs/Pleura: 2 adjacent nodules in the left upper lobe corresponding to the density on prior chest x-ray. These measure 14 mm (lateral) and 13 mm (medial) on image 27. The lateral nodule has a somewhat spiculated appearance. No other suspicious pulmonary nodules. No pleural effusions. No confluent areas of consolidation.  Musculoskeletal: Chest wall soft tissues are unremarkable. No acute bony abnormality or focal bone lesion.  Upper abdomen: Imaging into the upper abdomen shows no acute findings.  IMPRESSION: Two adjacent left upper lobe pulmonary nodules as described above. Cannot exclude lung cancer. These should be further evaluated with PET CT.  Multinodular scratch head multiple nodules throughout the thyroid, likely multinodular goiter.   Electronically Signed  By: Rolm Baptise M.D.  On: 03/27/2015 12:31  NUCLEAR MEDICINE PET SKULL BASE TO THIGH  TECHNIQUE: 10.2 mCi F-18 FDG was injected intravenously. Full-ring PET imaging was performed from the skull base to thigh after the radiotracer. CT data was obtained and used for attenuation correction and anatomic localization.  FASTING BLOOD GLUCOSE: Value: 94 mg/dl  COMPARISON: None.  FINDINGS: NECK  No hypermetabolic lymph nodes in the neck.  CHEST  There is diffuse enlargement of the thyroid gland which contains multiple nodules. There are 2 nodules  within the left upper lobe. The first nodule measures 1.6 cm and has an SUV max equal to 3.3. The adjacent nodule measures 1.1 cm and has an SUV max equal to 3.2. There is mild increased uptake within the left hilar region. Index measurement has an SUV max equal to 4.5. This is compared with blood pool activity of 3.2.  ABDOMEN/PELVIS  No abnormal hypermetabolic activity within the liver, pancreas, adrenal glands, or spleen. No hypermetabolic lymph nodes in the abdomen or pelvis.  SKELETON  No focal hypermetabolic activity to suggest skeletal metastasis. There is distension of the left hip joint with asymmetric increased FDG uptake, image 173 of series 4. Fractures involve the fifth, sixth and seventh rib fractures on the left.  IMPRESSION: 1. There is malignant range FDG uptake associated with the 2 nodules in the left upper lobe identified on recent chest CT. Cannot rule out primary pulmonary neoplasm. Correlation with tissue sampling is recommended. 2. Mildly increased FDG uptake associated with the left hilar region which is above blood pool activity. Left hilar lymph node metastasis cannot be excluded. 3. Left rib fractures. 4. Abnormal increased radiotracer uptake associated with the distended left hip joint space. Correlation for any clinical signs or symptoms of infection within this area is recommended.   Electronically Signed  By: Kerby Moors M.D.  On: 04/03/2015 08:59  Pulmonary Function Tests FVC 1.91 (59%), post bronchodilator 1.85 (57%) FEV1 1.60 (64%), post bronchodilator 1.49 (60%) FEV1/FVC 0.84 DLCO 42%  Impression: 70 year old woman with a history of tobacco abuse with 2 left upper lobe nodules (possibly a dumbbell type lesion) that are hypermetabolic by PET CT. There also was some hypermetabolic activity in the hilum, but no definite adenopathy in that area. This is highly suspicious for primary bronchogenic carcinoma, and has to be  considered that unless he can be proven otherwise.  I reviewed the CT scan and PET/CT personally and also reviewed the films with Mrs. and Mr. Kimble. I also reviewed the pulmonary function test.  I discussed several options with her. 1. Continued radiographic follow-up- I am not in favor of this giving her age, smoking history and appearance on PET/CT.  2. CT-guided needle biopsy- we discussed the pros and cons of this prior to surgery. They understand that a negative CT guided biopsy would not definitively rule out cancer.  3. Left VATS, wedge resection, with possible lobectomy depending on frozen section results, and cryo-analgesia of intercostal nerves. They understand that this would be definitively diagnostic. They do understand this is a major operative procedure with significant risk.  After a lengthy discussion of the options, she wishes to proceed with surgical resection for definitive  diagnosis.  I have discussed the general nature of the procedure, the need for general anesthesia, and the incisions to be used for left VATS, wedge resection, possible left upper lobectomy, chronic allergies of intercostal nerves. I reviewed the indications, risks, benefits, and alternatives. We discussed the expected hospital stay, overall recovery and short and long term outcomes. They understand the risks include, but are not limited to death, stroke, MI, DVT/PE, bleeding, possible need for transfusion, infections, cardiac arrhythmias, prolonged air leaks, and other unforeseeable complications.   She does have a new peripheral edema that she says has developed over the past 3 weeks. It is bilateral. It is significant. She has been on Lasix 20 mg daily chronically for hypertension. She says she's never had problems with swelling before. Given the possibility that there is malignancy I think that she needs a duplex to rule out DVT before proceeding with surgery. I will try to schedule that today. I also  advised her to increase her Lasix to 40 mg daily and elevate her legs.   Plan: Increase Lasix to 40 mg daily  Duplex lower extremities rule out DVT  She accepts the risk and wishes to proceed with surgical resection. Left VATS, wedge resection, possible left upper lobectomy, cryo-analgesia of intercostal nerves on Wednesday, 05/09/2015.  [name], MD [facility] [phone]

## 2015-05-09 NOTE — Anesthesia Preprocedure Evaluation (Signed)
Anesthesia Evaluation  Patient identified by MRN, date of birth, ID band Patient awake    Reviewed: Allergy & Precautions, NPO status , Patient's Chart, lab work & pertinent test results  Airway Mallampati: II   Neck ROM: full    Dental   Pulmonary Current Smoker,  LUL nodule. breath sounds clear to auscultation        Cardiovascular hypertension, Rhythm:regular Rate:Normal     Neuro/Psych    GI/Hepatic   Endo/Other  Hyperthyroidism obese  Renal/GU      Musculoskeletal  (+) Arthritis -,   Abdominal   Peds  Hematology   Anesthesia Other Findings   Reproductive/Obstetrics                             Anesthesia Physical Anesthesia Plan  ASA: III  Anesthesia Plan: General   Post-op Pain Management:    Induction: Intravenous  Airway Management Planned: Double Lumen EBT  Additional Equipment: Arterial line and CVP  Intra-op Plan:   Post-operative Plan: Extubation in OR  Informed Consent: I have reviewed the patients History and Physical, chart, labs and discussed the procedure including the risks, benefits and alternatives for the proposed anesthesia with the patient or authorized representative who has indicated his/her understanding and acceptance.     Plan Discussed with: CRNA, Anesthesiologist and Surgeon  Anesthesia Plan Comments:         Anesthesia Quick Evaluation

## 2015-05-09 NOTE — Anesthesia Postprocedure Evaluation (Signed)
  Anesthesia Post-op Note  Patient: Tiffany [name]  Procedure(s) Performed: Procedure(s): LEFT VIDEO ASSISTED THORACOSCOPY (VATS) WITH LEFT LUNG UPPER LOBE WEDGE RESECTION (Left) LEFT LUNG UPPER LOBECTOMY (Left) CRYO INTERCOSTAL NERVE BLOCK (Left) LYMPH NODE DISSECTION (Left)  Patient Location: PACU  Anesthesia Type: General   Level of Consciousness: awake, alert  and oriented  Airway and Oxygen Therapy: Patient Spontanous Breathing  Post-op Pain: mild  Post-op Assessment: Post-op Vital signs reviewed  Post-op Vital Signs: Reviewed  Last Vitals:  Filed Vitals:   05/09/15 1847  BP:   Pulse:   Temp:   Resp: 18    Complications: No apparent anesthesia complications

## 2015-05-09 NOTE — Brief Op Note (Addendum)
05/09/2015  5:48 PM  PATIENT:  Floyde Parkins  69 y.o. female  PRE-OPERATIVE DIAGNOSIS:  LEFT UPPER LOBE NODULE  POST-OPERATIVE DIAGNOSIS:  LEFT UPPER LOBE ADENOCARCINOMA  PROCEDURE:   LEFT VIDEO ASSISTED THORACOSCOPY   LEFT UPPER LOBE WEDGE RESECTION  THORACOSCOPIC LEFT UPPER LOBECTOMY  CRYOANALGESIA INTERCOSTAL NERVES  LYMPH NODE DISSECTION  SURGEON:  [name], MD  ASSISTANT: Suzzanne Cloud, PA-C  ANESTHESIA:   general  SPECIMEN:  Source of Specimen:  left upper lobe, mediastinal lymph nodes  DISPOSITION OF SPECIMEN:  Pathology  DRAINS: 89 Fr CT, 28 Blake drain  PATIENT CONDITION:  PACU - hemodynamically stable.   Mass in mod portion of LUL along the fissure with retraction of overlying visceral pleura. Frozen + adenocarcinoma, Bronchial margin negative

## 2015-05-10 ENCOUNTER — Inpatient Hospital Stay (HOSPITAL_COMMUNITY): Payer: Medicare Other

## 2015-05-10 ENCOUNTER — Encounter (HOSPITAL_COMMUNITY): Payer: Self-pay | Admitting: Thoracic Surgery (Cardiothoracic Vascular Surgery)

## 2015-05-10 LAB — BASIC METABOLIC PANEL
ANION GAP: 6 (ref 5–15)
BUN: 10 mg/dL (ref 6–20)
CALCIUM: 8.3 mg/dL — AB (ref 8.9–10.3)
CO2: 24 mmol/L (ref 22–32)
Chloride: 102 mmol/L (ref 101–111)
Creatinine, Ser: 0.76 mg/dL (ref 0.44–1.00)
GFR calc Af Amer: >60 mL/min (ref >60)
GFR calc non Af Amer: >60 mL/min (ref >60)
GLUCOSE: 140 mg/dL — AB (ref 65–99)
Potassium: 4.6 mmol/L (ref 3.5–5.1)
SODIUM: 132 mmol/L — AB (ref 135–145)

## 2015-05-10 LAB — BLOOD GAS, ARTERIAL
ACID-BASE DEFICIT: 0 mmol/L (ref 0.0–2.0)
Bicarbonate: 24.2 mEq/L — ABNORMAL HIGH (ref 20.0–24.0)
Drawn by: 347621
FIO2: 0.32 %
O2 Content: 3 L/min
O2 SAT: 95.4 %
PCO2 ART: 40.6 mmHg (ref 35.0–45.0)
PO2 ART: 78.9 mmHg — AB (ref 80.0–100.0)
Patient temperature: 98.6
TCO2: 25.5 mmol/L (ref 0–100)
pH, Arterial: 7.393 (ref 7.350–7.450)

## 2015-05-10 LAB — CBC
HCT: 32.9 % — ABNORMAL LOW (ref 36.0–46.0)
HEMOGLOBIN: 11.1 g/dL — AB (ref 12.0–15.0)
MCH: 33.2 pg (ref 26.0–34.0)
MCHC: 33.7 g/dL (ref 30.0–36.0)
MCV: 98.5 fL (ref 78.0–100.0)
Platelets: 275 10*3/uL (ref 150–400)
RBC: 3.34 MIL/uL — ABNORMAL LOW (ref 3.87–5.11)
RDW: 13.3 % (ref 11.5–15.5)
WBC: 13.7 10*3/uL — ABNORMAL HIGH (ref 4.0–10.5)

## 2015-05-10 MED ORDER — ALBUTEROL SULFATE (2.5 MG/3ML) 0.083% IN NEBU
INHALATION_SOLUTION | RESPIRATORY_TRACT | Status: AC
  Filled 2015-05-10: qty 3

## 2015-05-10 MED ORDER — ENOXAPARIN SODIUM 40 MG/0.4ML ~~LOC~~ SOLN
40.0000 mg | SUBCUTANEOUS | Status: DC
  Administered 2015-05-10 – 2015-05-15 (×5): 40 mg via SUBCUTANEOUS
  Filled 2015-05-10 (×6): qty 0.4

## 2015-05-10 MED ORDER — METOCLOPRAMIDE HCL 5 MG/ML IJ SOLN
10.0000 mg | Freq: Four times a day (QID) | INTRAMUSCULAR | Status: AC
  Administered 2015-05-10 – 2015-05-11 (×4): 10 mg via INTRAVENOUS
  Filled 2015-05-10 (×4): qty 2

## 2015-05-10 MED ORDER — ALBUTEROL SULFATE (2.5 MG/3ML) 0.083% IN NEBU
2.5000 mg | INHALATION_SOLUTION | Freq: Four times a day (QID) | RESPIRATORY_TRACT | Status: DC
  Administered 2015-05-10: 2.5 mg via RESPIRATORY_TRACT
  Filled 2015-05-10: qty 3

## 2015-05-10 MED ORDER — DICLOFENAC SODIUM 75 MG PO TBEC
75.0000 mg | DELAYED_RELEASE_TABLET | Freq: Two times a day (BID) | ORAL | Status: DC
  Administered 2015-05-10 – 2015-05-15 (×9): 75 mg via ORAL
  Filled 2015-05-10 (×17): qty 1

## 2015-05-10 MED ORDER — GUAIFENESIN ER 600 MG PO TB12
1200.0000 mg | ORAL_TABLET | Freq: Two times a day (BID) | ORAL | Status: AC
  Administered 2015-05-10 – 2015-05-14 (×9): 1200 mg via ORAL
  Filled 2015-05-10 (×10): qty 2

## 2015-05-10 MED ORDER — ALBUTEROL SULFATE (2.5 MG/3ML) 0.083% IN NEBU: 2.5000 mg | INHALATION_SOLUTION | RESPIRATORY_TRACT | Status: DC | PRN

## 2015-05-10 NOTE — Care Management Note (Signed)
Case Management Note  Patient Details  Name: Tiffany [name] MRN: [id number] Date of Birth: Feb 05, 1946  Subjective/Objective:                  From with family admitted with L lung nodule.   Action/Plan:  Return to home when medically stable. CM to f/u with disposition needs. Expected Discharge Date:                  Expected Discharge Plan:  Home/Self Care  In-House Referral:     Discharge planning Services  CM Consult  Post Acute Care Choice:    Choice offered to:     DME Arranged:    DME Agency:     HH Arranged:    HH Agency:     Status of Service:  In process, will continue to follow  Medicare Important Message Given:    Date Medicare IM Given:    Medicare IM give by:    Date Additional Medicare IM Given:    Additional Medicare Important Message give by:     If discussed at [place] of Stay Meetings, dates discussed:    Additional Comments:  Sharin Mons, RN 05/10/2015, 9:14 PM

## 2015-05-10 NOTE — Progress Notes (Signed)
UR COMPLETED  

## 2015-05-10 NOTE — Progress Notes (Signed)
1 Day Post-Op Procedure(s) (LRB): LEFT VIDEO ASSISTED THORACOSCOPY (VATS) WITH LEFT LUNG UPPER LOBE WEDGE RESECTION (Left) LEFT LUNG UPPER LOBECTOMY (Left) CRYO INTERCOSTAL NERVE BLOCK (Left) LYMPH NODE DISSECTION (Left) Subjective: C/o incisional pain, wheezing  Objective: Vital signs in last 24 hours: Temp:  [97.5 F (36.4 C)-98.6 F (37 C)] 97.7 F (36.5 C) (05/12 0700) Pulse Rate:  [67-86] 75 (05/12 0700) Cardiac Rhythm:  [-] Normal sinus rhythm (05/12 0700) Resp:  [12-29] 15 (05/12 0700) BP: (109-145)/(59-108) 109/74 mmHg (05/12 0700) SpO2:  [95 %-100 %] 97 % (05/12 0700) Arterial Line BP: (128-160)/(67-79) 160/77 mmHg (05/12 0020) Weight:  [200 lb (90.719 kg)-203 lb 11.3 oz (92.4 kg)] 203 lb 11.3 oz (92.4 kg) (05/11 2045)  Hemodynamic parameters for last 24 hours:    Intake/Output from previous day: 05/11 0701 - 05/12 0700 In: 4496.7 [P.O.:120; I.V.:4376.7] Out: 1344 [Urine:550; Blood:550; Chest Tube:244] Intake/Output this shift:    General appearance: alert and no distress Neurologic: intact Heart: regular rate and rhythm Lungs: diminished breath sounds left base and wheezes bilaterally Abdomen: mildly distended, nontender no air leak, serosanguinous drainage  Lab Results:  Recent Labs  05/07/15 1606 05/09/15 1715 05/10/15 0500  WBC 11.0*  --  13.7*  HGB 13.4 12.2 11.1*  HCT 41.0 36.0 32.9*  PLT 286  --  275   BMET:  Recent Labs  05/07/15 1606 05/09/15 1715 05/10/15 0500  NA 132* 132* 132*  K 3.6 4.4 4.6  CL 99*  --  102  CO2 21*  --  24  GLUCOSE 87  --  140*  BUN 19  --  10  CREATININE 1.21*  --  0.76  CALCIUM 9.0  --  8.3*    PT/INR:  Recent Labs  05/07/15 1606  LABPROT 12.9  INR 0.96   ABG    Component Value Date/Time   PHART 7.393 05/10/2015 0505   HCO3 24.2* 05/10/2015 0505   TCO2 25.5 05/10/2015 0505   ACIDBASEDEF 0.0 05/10/2015 0505   O2SAT 95.4 05/10/2015 0505   CBG (last 3)  No results for input(s): GLUCAP in the last  72 hours.  Assessment/Plan: S/P Procedure(s) (LRB): LEFT VIDEO ASSISTED THORACOSCOPY (VATS) WITH LEFT LUNG UPPER LOBE WEDGE RESECTION (Left) LEFT LUNG UPPER LOBECTOMY (Left) CRYO INTERCOSTAL NERVE BLOCK (Left) LYMPH NODE DISSECTION (Left) POD # 1 left upper lobectomy  CV- stable  RESP_ wheezing- albuterol nebs  mucinex to help with secretions  IS for subsegmental atelectasis  No air leak- CT to water seal  RENAL_ mild hyponatremia  ENDO- CBG mildly elevated  SCD + enoxaparin for DVT prophylaxis  Mobilize,  Will ask PT to assist  LOS: 1 day    Tiffany [name] 05/10/2015

## 2015-05-10 NOTE — Progress Notes (Signed)
PT Cancellation Note  Patient Details Name: Tiffany [name] MRN: [id number] DOB: 25-Aug-1946   Cancelled Treatment:    Reason Eval/Treat Not Completed: Other (comment) (just back to bed from mobility.  Will see her tomorrow as able. 05/10/2015  Donnella Sham, [place] [phone]  (pager)   [name], [name] 05/10/2015, 5:17 PM

## 2015-05-10 NOTE — Op Note (Signed)
NAMEFRAIDA, VELDMAN NO.:  [id number]  MEDICAL RECORD NO.:  [id number]  LOCATION:  3S15C                        FACILITY:  [facility]  PHYSICIAN:  Revonda Standard. Roxan Hockey, M.D.DATE OF BIRTH:  1946/12/13  DATE OF PROCEDURE:  05/09/2015 DATE OF DISCHARGE:                              OPERATIVE REPORT   PREOPERATIVE DIAGNOSIS:  Left upper lobe nodule.  POSTOPERATIVE DIAGNOSIS:  Left upper lobe adenocarcinoma.  PROCEDURE:  Left video-assisted thoracoscopy, wedge resection left upper lobe, thoracoscopic left upper lobectomy, mediastinal lymph node dissection, cryoanalgesia of intercostal nerves III through VII.  SURGEON:  Revonda Standard. Roxan Hockey, M.D.  ASSISTANT:  Suzzanne Cloud, P.A.  ANESTHESIA:  General.  FINDINGS:  Mass in the midportion of the left upper lobe along the fissure.  There was retraction of the overlying visceral pleura.  Frozen section revealed adenocarcinoma.  Bronchial margin of left upper lobe negative for tumor.  CLINICAL NOTE:  Mrs. Civello is a 69 year old woman who was found to have a nodule in her left upper lobe on a chest x-ray as part of an annual checkup.  A followup x-ray showed the nodule was persistent.  A CT was done, there were 2 nodules in close proximity. It was questionable whether these were separate nodules or a dumbbell shaped tumor.  A PET- CT showed the lesions were hypermetabolic.  She was advised to undergo surgical resection with a plan for a wedge resection for definitive diagnosis to be followed by lobectomy if the nodules were cancerous.  We also discussed the use of cryoanalgesia for postoperative pain control. The indications, risks, benefits, and alternatives were discussed in detail with the patient, she understood and accepted the risks and agreed to proceed.  OPERATIVE NOTE:  Mrs. Rocha was brought to the preoperative holding area on May 09, 2015.  Anesthesia placed a central line and an arterial blood pressure  monitoring line.  She was taken to the operating room, anesthetized, and intubated with a double-lumen endotracheal tube. Intravenous antibiotics were administered.  A Foley catheter was placed. Sequential compressive devices were placed on the calves for DVT prophylaxis.  She was placed in a right lateral decubitus position and the left chest was prepped and draped in the usual sterile fashion.  Single lung ventilation of the right lung was initiated and was tolerated well throughout the procedure.  An incision was made in the midaxillary line in the seventh intercostal space.  A 5 mm port was inserted into the chest.  The thoracoscope was advanced into the chest.  There was no pleural effusion and no abnormality of the parietal pleura.  A second port incision was made anterior to the first for instrumentation.  A 15 mm flexible port was placed through this incision.  A small working incision was made in the fourth interspace anterolaterally.  No rib spreading was performed during the procedure. The left upper lobe was inspected.  There was a mass in the mid portion of the lobe on the lateral inferior margin along the major fissure. There was retraction of the overlying visceral pleura.  The fissure was incomplete.  The fissure was dissected out sufficiently to allow stapling to remove the nodule with  a wedge resection without compromise of the vasculature.  A wedge resection then was performed with sequential firings of the endoscopic stapler using the tan cartridges. The specimen was placed into an endoscopic retrieval bag, removed from the chest, and sent for pathology.  While awaiting those results, cryoanalgesia was performed of the intercostal nerves beginning at the third interspace.  A 2 cm segment of the cryoprobe was placed over the intercostal nerve just below the overlying rib.  It was taken at -70 degrees Celsius for 2 minutes, this was repeated at the fourth, fifth, sixth,  and seventh interspaces.  The inferior ligament was divided.  A level 9 lymph node was removed.  All nodes that were encountered during the dissection were sent to Pathology as separate specimens.  The pleural reflection was divided anteriorly and posteriorly at the hilum.  By this point, the frozen section result returned, showing at least adenocarcinoma in situ with a strong suspicion that invasive adenocarcinoma was present. Therefore the decision was made to proceed with a left upper lobectomy as discussed with the patient preoperatively.  The lesion was centered between the posterior segment and the lingular segments.  It was unclear which segment it arose from and there was no way to get an adequate margin with wedge resection.  The major fissure then was completed between the lingula and the left upper lobe with sequential firings of the stapler.  The lingual arterial branch was identified, dissected out, and divided with the endoscopic vascular stapler.  More laterally, the fissure was more complete, there was a large posterior branch of the pulmonary artery. This was encircled and divided with the endoscopic vascular stapler. Next, attention was turned medially.  Superior pulmonary vein was identified.  Branches were dissected out and encircled and then divided with the endoscopic vascular stapler.  Dissection was carried more superiorly and a large apical branch was noted of the pulmonary artery. This was encircled and divided.  As additional lymph nodes were dissected out, there was a very small branch of the pulmonary artery anteriorly and then a large more posterior branch that were in close proximity.  This dissection was very difficult due to the angle and location relation to the left upper lobe bronchus, but ultimately these vessels were sufficiently dissected out.  They were then encircled and divided with the endoscopic vascular stapler.  At this point, only the bronchus  remained.  Additional nodes were taken and sent to Pathology. An endoscopic stapler with a green cartridge was placed across the left upper lobe bronchus and closed.  A test inflation showed good aeration of the lower lobe.  The stapler then was fired, transecting the bronchus.  The specimen was placed into an endoscopic retrieval bag, removed through the incision and sent to Pathology.  Frozen section of bronchial margin showed no tumor.  While awaiting the frozen section, additional level 10 nodes were removed.  The aortopulmonary window was opened and the level 5 node was taken.  This was enlarged, but otherwise benign appearing.  There also was a markedly enlarged, but otherwise benign-appearing level 7 node which was removed posteriorly.  A final inspection was made for hemostasis.  The chest was copiously irrigated with warm saline.  A test inflation at 30 cm of water showed no air leakage from the bronchial stump.  A 28-French Blake drain was placed through the first port incision and directed posteriorly.  A 28-French chest tube was placed through the second port incision and directed anteriorly.  The left lower lobe was reinflated.  The incision was closed in 3 layers.  The patient then was placed back in a supine position.  She was extubated in the operating room and taken to the postanesthetic care unit in good condition.     Revonda Standard Roxan Hockey, M.D.     SCH/MEDQ  D:  05/09/2015  T:  05/10/2015  Job:  [id number]

## 2015-05-11 ENCOUNTER — Inpatient Hospital Stay (HOSPITAL_COMMUNITY): Payer: Medicare Other

## 2015-05-11 LAB — COMPREHENSIVE METABOLIC PANEL
ALT: 16 U/L (ref 14–54)
AST: 17 U/L (ref 15–41)
Albumin: 2.3 g/dL — ABNORMAL LOW (ref 3.5–5.0)
Alkaline Phosphatase: 56 U/L (ref 38–126)
Anion gap: 8 (ref 5–15)
BUN: 11 mg/dL (ref 6–20)
CALCIUM: 8.3 mg/dL — AB (ref 8.9–10.3)
CHLORIDE: 101 mmol/L (ref 101–111)
CO2: 22 mmol/L (ref 22–32)
CREATININE: 0.9 mg/dL (ref 0.44–1.00)
GFR calc non Af Amer: >60 mL/min (ref >60)
Glucose, Bld: 104 mg/dL — ABNORMAL HIGH (ref 65–99)
POTASSIUM: 4.2 mmol/L (ref 3.5–5.1)
Sodium: 131 mmol/L — ABNORMAL LOW (ref 135–145)
TOTAL PROTEIN: 4.7 g/dL — AB (ref 6.5–8.1)
Total Bilirubin: 0.6 mg/dL (ref 0.3–1.2)

## 2015-05-11 LAB — CBC
HCT: 33.4 % — ABNORMAL LOW (ref 36.0–46.0)
HEMOGLOBIN: 10.7 g/dL — AB (ref 12.0–15.0)
MCH: 32.3 pg (ref 26.0–34.0)
MCHC: 32 g/dL (ref 30.0–36.0)
MCV: 100.9 fL — ABNORMAL HIGH (ref 78.0–100.0)
Platelets: 237 10*3/uL (ref 150–400)
RBC: 3.31 MIL/uL — AB (ref 3.87–5.11)
RDW: 13.4 % (ref 11.5–15.5)
WBC: 11.8 10*3/uL — ABNORMAL HIGH (ref 4.0–10.5)

## 2015-05-11 NOTE — Discharge Summary (Signed)
Physician Discharge Summary       [address]       [place] [address]             [phone]    Patient ID: Tiffany [name] MRN: [id number] DOB/AGE: May 07, 1946 69 y.o.  Admit date: 05/09/2015 Discharge date: 05/18/2015  Admission Diagnoses: 1. Left upper lobe nodule 2. History of tobacco abuse 3. History of hypertension 4.History of hyperlipidemia 5. History of pre diabetes 6. History of hyperthyroidism 7. History of obesity 8. History of balance disorder 9. History of gait difficulty 10. History of neuropathy  Discharge Diagnoses:  1. Stage IA(T1a,N0) adenocarcinoma Left upper lobe  2. History of tobacco abuse 3. History of hypertension 4.History of hyperlipidemia 5. History of pre diabetes 6. History of hyperthyroidism 7. History of obesity 8. History of balance disorder 9. History of gait difficulty 10. History of neuropathy 11. Anemia 12. Ulcerative esophagitis 13. Small pre pyloric ulcers 14. Multiple duodenal ulcers including giant ulcer between first and second portion requiring hemostatic therapy  Procedure (s):  1. Left video-assisted thoracoscopy, wedge resection left upper lobe, thoracoscopic left upper lobectomy, mediastinal lymph node dissection, cryoanalgesia of intercostal nerves III through VII by Dr. Roxan Hockey on 05/09/2015. 2. EGD w/ control of bleeding and EGD w/ biopsy by Dr. Henrene Pastor on 05/16/2015.  Pathology: 1. Lung, wedge biopsy/resection, Left upper - ADENOCARCINOMA, WELL-DIFFERENTIATED, SPANNING 2.0 CM. - SEE ONCOLOGY TABLE BELOW. 2. Lung, resection (segmental or lobe), Left upper - BENIGN LUNG PARENCHYMA WITH ASSOCIATED HEMORRHAGE AND INTRAALVEOLAR MACROPHAGES. - THREE BENIGN LYMPH NODES (0/3). - THERE IS NO EVIDENCE OF MALIGNANCY. 3. Lymph node, biopsy, 9 L - THERE IS NO EVIDENCE OF CARCINOMA IN 1 OF 1 LYMPH NODE (0/1). 4. Lymph node, biopsy, 10 L - THERE IS NO EVIDENCE OF CARCINOMA IN 1 OF 1 LYMPH NODE (0/1). 5.  Lymph node, biopsy, 11 L - THERE IS NO EVIDENCE OF CARCINOMA IN 1 OF 1 LYMPH NODE (0/1). 6. Lymph node, biopsy, 11 L #2 - THERE IS NO EVIDENCE OF CARCINOMA IN 1 OF 1 LYMPH NODE (0/1). 7. Lymph node, biopsy, 10 L #2 - THERE IS NO EVIDENCE OF CARCINOMA IN 1 OF 1 LYMPH NODE (0/1). 8. Lymph node, biopsy, 5 L - THERE IS NO EVIDENCE OF CARCINOMA IN 1 OF 1 LYMPH NODE (0/1). 9. Lymph node, biopsy, level 7 - THERE IS NO EVIDENCE OF CARCINOMA IN 1 OF 1 LYMPH NODE (0/1).  TNM Code: pT1a, pN0  History of Presenting Illness: This is a 69 year old woman who has a past medical history significant for hypertension, neuropathy, tobacco abuse (30 pack years), hyperthyroidism, and arthritis. She saw Dr. Melford Aase in November for an annual checkup. As part of her workup a chest x-ray was done. It showed some nodular densities in the left upper lobe. A follow-up x-ray was done in March which showed the nodules are still present. A CT scan was done which showed 2 nodules, 13 and 14 mm respectively, in close proximity in the left upper lobe. A PET CT showed the lesion were hypermetabolic. There also were multiple rib fractures, left fifth, sixth and seventh ribs. There also was an abnormality noted in the left hip, which was not felt to be metastatic.  She had bronchoscopy and endobronchial ultrasound. The results from those were unrevealing. AFB and fungal stains were negative.  She is still smoking about one half pack of cigarettes daily. She is trying to quit. Physical activity is limited due to neuropathy in  her feet a Charcot's joint in her ankle and left hip pain. She does use a walker. She denies chest pain, pressure, and tightness. She has no cardiac history. She denies shortness of breath. She denies cough, hemoptysis, wheezing. Her weight is unchanged over the past 3 months. She has not had any unusual headaches or visual changes. She does not remember any recent trauma involving her chest, but thinks she may  have broken ribs when she fractured her neck and had to have spinal fusion in 2008.  Dr. Roxan Hockey discussed several options with her. 1. Continued radiographic follow-up- I am not in favor of this giving her age, smoking history and appearance on PET/CT.  2. CT-guided needle biopsy- we discussed the pros and cons of this prior to surgery. They understand that a negative CT guided biopsy would not definitively rule out cancer.  3. Left VATS, wedge resection, with possible lobectomy depending on frozen section results, and cryo-analgesia of intercostal nerves. They understand that this would be definitively diagnostic. They do understand this is a major operative procedure with significant risk.  After a lengthy discussion of the options, she wishes to proceed with surgical resection for definitive diagnosis. Potential risks, benefits, and complications were discussed with the patient and she wished to proceed with surgery. She underwent a left video-assisted thoracoscopy, wedge resection left upper lobe, thoracoscopic left upper lobectomy, mediastinal lymph node dissection, cryoanalgesia of intercostal nerves III through VII on 05/09/2015.  Brief Hospital Course:  She remained afebrile and hemodynamically stable. A line and foley were removed early in her post op course. .Daily chest x rays were taken and remained stable. Chest tube output gradually decreased and there was no air leak. One chest tube was removed on 05/15. Remaining chest tube was placed to water seal. It was then removed on 05/**/2016. She developed nausea, vomiting, and diarrhea. Nausea later resolved. She did have ABL anemia.  Her H and H went down to 6.4 and 18.5. Repeat was verified at 6.7 and 19.3. She was transfused. A GI consult was obtained. She underwent an EGD on 05/18. Results showed ulcerative esophagitis , small pre pyloric ulcers, and multiple duodenal ulcers including giant ulcer between first and second portion  requiring hemostatic therapy. She was started on Protonix 40 mg bid. Nystatin was added as she had thrush. Her last H and H was stable at 8.3 and 25.4. She is ambulating on room air. Her incision is clean, dry, continuing to heal, and there are no signs of infection. She is tolerating a diet and has had a bowel movement. She is felt surgically stable for discharge today.   Latest Vital Signs: Blood pressure 124/70, pulse 72, temperature 98.1 F (36.7 C), temperature source Oral, resp. rate 24, height '5\' 7"'$  (1.702 m), weight 203 lb 11.3 oz (92.4 kg), SpO2 97 %.  Physical Exam: General appearance: alert and no distress Neurologic: intact Heart: regular rate and rhythm Lungs: diminished breath sounds left no air leak, moderate serosanguinous output from chest tube  Discharge Condition:Stable and discharged to home.  Recent laboratory studies:  Lab Results  Component Value Date   WBC 8.8 05/18/2015   HGB 8.3* 05/18/2015   HCT 25.4* 05/18/2015   MCV 91.7 05/18/2015   PLT 347 05/18/2015   Lab Results  Component Value Date   NA 129* 05/15/2015   K 3.4* 05/15/2015   CL 96* 05/15/2015   CO2 26 05/15/2015   CREATININE 0.72 05/15/2015   GLUCOSE 98 05/15/2015  Diagnostic Studies:   Dg Chest Port 1 View  2015/05/16   CLINICAL DATA:  Status post left-sided lobectomy  EXAM: PORTABLE CHEST - 1 VIEW  COMPARISON:  May 10, 2015  FINDINGS: There is postoperative change on the left with mild volume loss. There is a small pneumothorax on the left laterally without tension component. There are 2 chest tubes on the left, unchanged in position. Central catheter tip is in the superior vena cava. There is focal atelectasis in the right mid lung. The right lung is otherwise clear. Heart size and pulmonary vascularity within normal limits. No adenopathy.  IMPRESSION: Small lateral left pneumothorax with chest tubes in place on the left. No tension component. Atelectasis right mid lung, slightly less  than 1 day prior. No edema or consolidation. No change in cardiac silhouette.   Electronically Signed   By: Lowella Grip III M.D.   On: 05/16/15 07:50   Discharge Medications:   Medication List    STOP taking these medications        diclofenac 75 MG EC tablet  Commonly known as:  VOLTAREN      TAKE these medications        atenolol 100 MG tablet  Commonly known as:  TENORMIN  TAKE 1 TABLET BY MOUTH DAILY FOR BLOOD PRESSURE     buPROPion 150 MG 12 hr tablet  Commonly known as:  WELLBUTRIN SR  TAKE 1 TABLET BY MOUTH TWICE DAILY     diphenhydrAMINE 25 MG tablet  Commonly known as:  BENADRYL  Take 25 mg by mouth every 8 (eight) hours as needed for allergies.     enalapril 20 MG tablet  Commonly known as:  VASOTEC  TAKE 1 TABLET BY MOUTH DAILY FOR HIGH BLOOD PRESSURE     furosemide 40 MG tablet  Commonly known as:  LASIX  Take 40 mg by mouth daily.     HYDROcodone-acetaminophen 5-325 MG per tablet  Commonly known as:  NORCO/VICODIN  Take 1 tablet by mouth every 6 (six) hours as needed for severe pain.     MULTIVITAMIN PO  Take by mouth.     nystatin 100000 UNIT/ML suspension  Commonly known as:  MYCOSTATIN  Take 5 mLs (500,000 Units total) by mouth 4 (four) times daily.     pantoprazole 40 MG tablet  Commonly known as:  PROTONIX  Take 1 tablet (40 mg total) by mouth 2 (two) times daily.  Start taking on:  05/19/2015     potassium chloride SA 20 MEQ tablet  Commonly known as:  K-DUR,KLOR-CON  Take 1 tablet (20 mEq total) by mouth daily.     pravastatin 40 MG tablet  Commonly known as:  PRAVACHOL  TAKE 1 TABLET BY MOUTH EVERY NIGHT AT BEDTIME FOR CHOLESTEROL     VITAMIN D PO  Take 5,000 Units by mouth 2 (two) times daily.        Follow Up Appointments: Follow-up Information    Follow up with [name], MD On 06/05/2015.   Specialty:  Cardiothoracic Surgery   Why:  PA/LAT CXR to be taken (at [facility] which is in the same building  as Dr. Leonarda Salon office) on 06/04/2015 at 2:00 pm;Appointment is with physician assistant and the time is at 3:00 pm   Contact information:   [place] [address] [phone]       Follow up with Scarlette Shorts, MD.   Specialty:  Gastroenterology   Why:  Call  for a follow up appointment (ulcers)   Contact information:   [address] [place] [address] [phone]       Signed: Lars Pinks MPA-C 05/18/2015, 10:33 AM

## 2015-05-11 NOTE — Discharge Instructions (Signed)
°  ACTIVITY:  1.Increase activity slowly. 2.Walk daily and increase frequency and duration as tolerates. 3.May walk up steps. 4.No lifting more than ten pounds for two weeks. 5.No driving for two weeks. 6.Avoid straining. 7.STOP any activity that causes chest pain, shortness of breath, dizziness,sweating,     or excessive weakness. 8.Continue with breathing exercises daily.  DIET:  Diabetic, low fat, Low salt diet   WOUND:  1.May shower. 2.Clean wounds with mild soap and water.  Call the office at [phone] if any problems arise.  Thoracoscopy Care After Refer to this sheet in the next few weeks. These discharge instructions provide you with general information on caring for yourself after you leave the hospital. Your caregiver may also give you specific instructions. Your treatment has been planned according to the most current medical practices available, but unavoidable complications sometimes occur. If you have any problems or questions after discharge, call your caregiver. HOME CARE INSTRUCTIONS   Remove the bandage (dressing) over your chest tube site as directed by your caregiver.  It is normal to be sore for a couple weeks following surgery. See your caregiver if this seems to be getting worse rather than better.  Only take over-the-counter or prescription medicines for pain, discomfort, or fever as directed by your caregiver. It is very important to take pain medicine when you need it so that you will cough and breathe deeply enough to clear mucus (phlegm) and expand your lungs.  If it hurts to cough, hold a pillow against your chest when you cough. This may help with the discomfort. In spite of the discomfort, cough frequently, as this helps protect against getting an infection in your lung (pneumonia).  Taking deep breaths keeps lungs inflated and protects against pneumonia. Most patients will go home with an incentive spirometer that encourages deep breathing.  You  may resume a normal diet and activities as directed.  Use showers for bathing until you see your caregiver, or as instructed.  Change dressings if necessary or as directed.  Avoid lifting or driving until you are instructed otherwise.  Make an appointment to see your caregiver for stitch (suture) or staple removal when instructed.  Do not travel by airplane for 2 weeks after the chest tube is removed. SEEK MEDICAL CARE IF:   You are bleeding from your wounds.  You have redness, swelling, or increasing pain in the wounds.  Your heartbeat feels irregular or very fast.  There is pus coming from your wounds.  There is a bad smell coming from the wound or dressing. SEEK IMMEDIATE MEDICAL CARE IF:   You have a fever.  You develop a rash.  You have difficulty breathing.  You develop any reaction or side effects to medicines given.  You develop lightheadedness or feel faint.  You develop shortness of breath or chest pain. MAKE SURE YOU:   Understand these instructions.  Will watch your condition.  Will get help right away if you are not doing well or get worse. Document Released: 07/04/2005 Document Revised: 03/08/2012 Document Reviewed: 06/04/2011 [facility] Patient Information 2015 [place], [place]. This information is not intended to replace advice given to you by your health care provider. Make sure you discuss any questions you have with your health care provider.

## 2015-05-11 NOTE — Progress Notes (Signed)
Physical Therapy Evaluation Patient Details Name: ARLYLE STRATE MRN: [id number] DOB: 21-Dec-1946 Today's Date: 05/11/2015   History of Present Illness  Pt is a 69 y/o female presenting for surgery due to lung nodule, Status post left video-assisted thoracoscopy, wedge resection left upper.Marland Kitchen  PMHx; mild Charcot foot  Clinical Impression  Pt admitted with/for surgery for resection of L upper lung lobe via VATS.  Pt currently limited functionally due to the problems listed below.  (see problems list.)  Pt will benefit from PT to maximize function and safety to be able to get home safely with available assist of family.     Follow Up Recommendations No PT follow up;Supervision for mobility/OOB    Equipment Recommendations  None recommended by PT    Recommendations for Other Services       Precautions / Restrictions Precautions Precautions: Fall      Mobility  Bed Mobility               General bed mobility comments: not test OOB in chair  Transfers Overall transfer level: Needs assistance Equipment used: Rolling walker (2 wheeled) Transfers: Sit to/from Stand Sit to Stand: Min guard         General transfer comment: safe use of hands  Ambulation/Gait Ambulation/Gait assistance: Min guard Ambulation Distance (Feet): 120 Feet Assistive device: Rolling walker (2 wheeled) Gait Pattern/deviations: Step-to pattern;Step-through pattern Gait velocity: slower Gait velocity interpretation: Below normal speed for age/gender General Gait Details: mildly unsteady, short step to gait  Stairs            Wheelchair Mobility    Modified Rankin (Stroke Patients Only)       Balance Overall balance assessment: Needs assistance Sitting-balance support: Feet supported;No upper extremity supported Sitting balance-Leahy Scale: Fair       Standing balance-Leahy Scale: Fair                               Pertinent Vitals/Pain Pain Assessment:  Faces Faces Pain Scale: Hurts little more Pain Location: L flank incision Pain Descriptors / Indicators: Operative site guarding Pain Intervention(s): Monitored during session;Repositioned    Home Living Family/patient expects to be discharged to:: Private residence Living Arrangements: Spouse/significant other Available Help at Discharge: Family;Available 24 hours/day Type of Home: House Home Access: Stairs to enter   [employer] of Steps: 1 Home Layout: One level Home Equipment: Environmental consultant - 2 wheels      Prior Function Level of Independence: Independent               Hand Dominance        Extremity/Trunk Assessment   Upper Extremity Assessment: Overall WFL for tasks assessed           Lower Extremity Assessment: Overall WFL for tasks assessed         Communication   Communication: No difficulties  Cognition Arousal/Alertness: Awake/alert Behavior During Therapy: WFL for tasks assessed/performed Overall Cognitive Status: Within Functional Limits for tasks assessed                      General Comments General comments (skin integrity, edema, etc.): Vitals stable on RA    Exercises        Assessment/Plan    PT Assessment Patient needs continued PT services  PT Diagnosis Acute pain;Abnormality of gait   PT Problem List Decreased activity tolerance;Decreased mobility;Decreased knowledge of use of DME;Cardiopulmonary status limiting activity;Pain  PT Treatment Interventions DME instruction;Gait training;Functional mobility training;Therapeutic activities;Patient/family education   PT Goals (Current goals can be found in the Care Plan section) Acute Rehab PT Goals Patient Stated Goal: Independent PT Goal Formulation: With patient Time For Goal Achievement: 05/18/15 Potential to Achieve Goals: Good    Frequency Min 3X/week   Barriers to discharge        Co-evaluation               End of Session   Activity Tolerance:  Patient tolerated treatment well Patient left: in chair;with call bell/phone within reach Nurse Communication: Mobility status         Time: 1610-9604 PT Time Calculation (min) (ACUTE ONLY): 20 min   Charges:   PT Evaluation $Initial PT Evaluation Tier I: 1 Procedure     PT G Codes:        Quetzali Heinle, Eliseo Gum 05/11/2015, 10:19 AM 05/11/2015  Prescott Bing, PT [phone]  (pager)

## 2015-05-11 NOTE — Progress Notes (Signed)
Medicare Important Message given? YES (If response is "NO", the following Medicare IM given date fields will be blank) Date Medicare IM given: 05/11/15 Medicare IM given by: Whitman Hero

## 2015-05-11 NOTE — Progress Notes (Signed)
2 Days Post-Op Procedure(s) (LRB): LEFT VIDEO ASSISTED THORACOSCOPY (VATS) WITH LEFT LUNG UPPER LOBE WEDGE RESECTION (Left) LEFT LUNG UPPER LOBECTOMY (Left) CRYO INTERCOSTAL NERVE BLOCK (Left) LYMPH NODE DISSECTION (Left) Subjective: Pain well controlled Denies nausea but appetite is poor   Objective: Vital signs in last 24 hours: Temp:  [97.7 F (36.5 C)-98.5 F (36.9 C)] 97.8 F (36.6 C) (05/13 0700) Pulse Rate:  [65-76] 71 (05/13 0405) Cardiac Rhythm:  [-] Normal sinus rhythm (05/13 0405) Resp:  [12-21] 20 (05/13 0405) BP: (83-134)/(58-77) 119/77 mmHg (05/13 0405) SpO2:  [95 %-100 %] 98 % (05/13 0405)  Hemodynamic parameters for last 24 hours:    Intake/Output from previous day: 05/12 0701 - 05/13 0700 In: 1915 [P.O.:240; I.V.:1675] Out: 1300 [Urine:1050; Chest Tube:250] Intake/Output this shift:    General appearance: alert and no distress Neurologic: intact Heart: regular rate and rhythm Lungs: diminished breath sounds left no air leak, moderate serosanguinous output from chest tube  Lab Results:  Recent Labs  05/10/15 0500 05/11/15 0415  WBC 13.7* 11.8*  HGB 11.1* 10.7*  HCT 32.9* 33.4*  PLT 275 237   BMET:  Recent Labs  05/10/15 0500 05/11/15 0415  NA 132* 131*  K 4.6 4.2  CL 102 101  CO2 24 22  GLUCOSE 140* 104*  BUN 10 11  CREATININE 0.76 0.90  CALCIUM 8.3* 8.3*    PT/INR: No results for input(s): LABPROT, INR in the last 72 hours. ABG    Component Value Date/Time   PHART 7.393 05/10/2015 0505   HCO3 24.2* 05/10/2015 0505   TCO2 25.5 05/10/2015 0505   ACIDBASEDEF 0.0 05/10/2015 0505   O2SAT 95.4 05/10/2015 0505   CBG (last 3)  No results for input(s): GLUCAP in the last 72 hours.  Assessment/Plan: S/P Procedure(s) (LRB): LEFT VIDEO ASSISTED THORACOSCOPY (VATS) WITH LEFT LUNG UPPER LOBE WEDGE RESECTION (Left) LEFT LUNG UPPER LOBECTOMY (Left) CRYO INTERCOSTAL NERVE BLOCK (Left) LYMPH NODE DISSECTION (Left) POD # 2  Doing  well No air leak-will dc anterior CT Leave posterior CT until drainage < 250 ml/day Decreased atelectasis in right lung on CXR today Continue ambulation Path pending SCD + enoxaparin for DVT prophylaxis Anemia secondary to ABL- mild, follow   LOS: 2 days    [name] 05/11/2015

## 2015-05-12 ENCOUNTER — Inpatient Hospital Stay (HOSPITAL_COMMUNITY): Payer: Medicare Other

## 2015-05-12 MED ORDER — FLEET ENEMA 7-19 GM/118ML RE ENEM
1.0000 | ENEMA | Freq: Once | RECTAL | Status: AC
  Administered 2015-05-12: 1 via RECTAL
  Filled 2015-05-12: qty 1

## 2015-05-12 MED ORDER — POLYETHYLENE GLYCOL 3350 17 G PO PACK
17.0000 g | PACK | Freq: Every day | ORAL | Status: DC
  Administered 2015-05-12 – 2015-05-13 (×2): 17 g via ORAL
  Filled 2015-05-12 (×3): qty 1

## 2015-05-12 MED ORDER — METOCLOPRAMIDE HCL 5 MG/ML IJ SOLN
10.0000 mg | Freq: Four times a day (QID) | INTRAMUSCULAR | Status: AC
  Administered 2015-05-12 – 2015-05-13 (×4): 10 mg via INTRAVENOUS
  Filled 2015-05-12 (×4): qty 2

## 2015-05-12 NOTE — Progress Notes (Addendum)
[address]       [place] [address]             [phone]          3 Days Post-Op Procedure(s) (LRB): LEFT VIDEO ASSISTED THORACOSCOPY (VATS) WITH LEFT LUNG UPPER LOBE WEDGE RESECTION (Left) LEFT LUNG UPPER LOBECTOMY (Left) CRYO INTERCOSTAL NERVE BLOCK (Left) LYMPH NODE DISSECTION (Left)  Subjective: Persistent nausea all night which she feels is related to stool softener.  Zofran not helping. Breathing stable, off O2.   Objective: Vital signs in last 24 hours: Patient Vitals for the past 24 hrs:  BP Temp Temp src Pulse Resp SpO2  05/12/15 0700 - 97.7 F (36.5 C) Oral - - -  05/12/15 0439 (!) 148/72 mmHg 98.4 F (36.9 C) Oral 87 (!) 21 99 %  05/11/15 2313 103/90 mmHg 98.2 F (36.8 C) Oral 84 18 99 %  05/11/15 1913 (!) 134/100 mmHg 97.9 F (36.6 C) Oral 71 16 99 %  05/11/15 1558 - - - - 15 98 %  05/11/15 1500 - 97.7 F (36.5 C) Oral - - -  05/11/15 1418 129/78 mmHg - - 78 17 98 %  05/11/15 1114 - - - - 19 100 %  05/11/15 1100 - 97.9 F (36.6 C) Oral - - -  05/11/15 1043 121/66 mmHg - - 70 (!) 22 98 %   Current Weight  05/09/15 203 lb 11.3 oz (92.4 kg)     Intake/Output from previous day: 05/13 0701 - 05/14 0700 In: 1695 [P.O.:120; I.V.:1575] Out: 1565 [Urine:1325; Chest Tube:240]    PHYSICAL EXAM:  Heart: RRR Lungs: Few coarse BS that clear with cough Wound: Clean and dry Chest tube: No air leak    Lab Results: CBC: Recent Labs  05/10/15 0500 05/11/15 0415  WBC 13.7* 11.8*  HGB 11.1* 10.7*  HCT 32.9* 33.4*  PLT 275 237   BMET:  Recent Labs  05/10/15 0500 05/11/15 0415  NA 132* 131*  K 4.6 4.2  CL 102 101  CO2 24 22  GLUCOSE 140* 104*  BUN 10 11  CREATININE 0.76 0.90  CALCIUM 8.3* 8.3*    PT/INR: No results for input(s): LABPROT, INR in the last 72 hours.  CXR: FINDINGS: Minimal lateral left pneumothorax seen on the previous day's study is unchanged. 1 of the 2 left-sided chest tubes has been  removed. The other is stable with its tip near the apex.  There is persistent streaky opacity in the left lung base likely atelectasis. Right lung remains clear. Right internal jugular central venous line is stable.  IMPRESSION: 1. Status post removal of 1 of the 2 left-sided chest tubes. 2. No change in the minimal left lateral pneumothorax. No new abnormalities.   Assessment/Plan: S/P Procedure(s) (LRB): LEFT VIDEO ASSISTED THORACOSCOPY (VATS) WITH LEFT LUNG UPPER LOBE WEDGE RESECTION (Left) LEFT LUNG UPPER LOBECTOMY (Left) CRYO INTERCOSTAL NERVE BLOCK (Left) LYMPH NODE DISSECTION (Left)  CXR with stable lateral ptx, CT with no air leak.  Output around 240 ml over past 24 hrs, so will probably need to leave CT 1 more day.  GI- continues to have nausea.  Will d/c stool softener, which she states she has trouble taking at home.  She has not had a BM and is complaining of cramping, so will try Miralax/LOC today. Will add a few doses of Reglan since she has had intermittent nausea since surgery.  Mobilize as tolerated, continue pulm toilet.   LOS:  3 days    COLLINS,GINA H 05/12/2015   Chart reviewed, patient examined, agree with above. Chest tube tubing full of fluid. Will keep in until tomorrow. CXR looks ok.

## 2015-05-13 ENCOUNTER — Inpatient Hospital Stay (HOSPITAL_COMMUNITY): Payer: Medicare Other

## 2015-05-13 MED ORDER — METOCLOPRAMIDE HCL 5 MG/ML IJ SOLN
10.0000 mg | Freq: Four times a day (QID) | INTRAMUSCULAR | Status: AC
  Administered 2015-05-13 – 2015-05-14 (×4): 10 mg via INTRAVENOUS
  Filled 2015-05-13 (×4): qty 2

## 2015-05-13 MED ORDER — FLEET ENEMA 7-19 GM/118ML RE ENEM
1.0000 | ENEMA | Freq: Once | RECTAL | Status: AC
  Administered 2015-05-13: 1 via RECTAL
  Filled 2015-05-13: qty 1

## 2015-05-13 MED ORDER — BISACODYL 10 MG RE SUPP
10.0000 mg | Freq: Every day | RECTAL | Status: DC | PRN
  Administered 2015-05-13: 10 mg via RECTAL
  Filled 2015-05-13: qty 1

## 2015-05-13 NOTE — Progress Notes (Addendum)
       [address]       [place] [address]             [phone]          4 Days Post-Op Procedure(s) (LRB): LEFT VIDEO ASSISTED THORACOSCOPY (VATS) WITH LEFT LUNG UPPER LOBE WEDGE RESECTION (Left) LEFT LUNG UPPER LOBECTOMY (Left) CRYO INTERCOSTAL NERVE BLOCK (Left) LYMPH NODE DISSECTION (Left)  Subjective: Still having a lot of nausea, not able to eat. Took Miralax and a enema last night and had a small BM.  Breathing stable.   Objective: Vital signs in last 24 hours: Patient Vitals for the past 24 hrs:  BP Temp Temp src Pulse Resp SpO2  05/13/15 0813 (!) 144/96 mmHg 97.9 F (36.6 C) Oral (!) 105 17 97 %  05/13/15 0800 - - - - 18 98 %  05/13/15 0337 (!) 145/89 mmHg 99.1 F (37.3 C) Oral 100 (!) 22 98 %  05/12/15 2330 (!) 158/85 mmHg 98.1 F (36.7 C) Oral (!) 110 (!) 22 99 %  05/12/15 2021 (!) 156/90 mmHg 97.4 F (36.3 C) Oral (!) 104 17 97 %  05/12/15 1750 - - - - (!) 22 100 %  05/12/15 1559 (!) 144/92 mmHg 97.6 F (36.4 C) Oral 96 (!) 24 100 %  05/12/15 1200 - - - - (!) 22 100 %  05/12/15 1100 - 97.5 F (36.4 C) Oral - - -   Current Weight  05/09/15 203 lb 11.3 oz (92.4 kg)     Intake/Output from previous day: 05/14 0701 - 05/15 0700 In: 2070 [P.O.:120; I.V.:1950] Out: 1100 [Urine:900; Chest Tube:200]    PHYSICAL EXAM:  Heart: RRR, mildly tachy 110s Lungs: Decreased BS on L Wound: Clean and dry Abdomen: Soft, NT/ND, +BS Chest tube: No air leak    Lab Results: CBC: Recent Labs  05/11/15 0415  WBC 11.8*  HGB 10.7*  HCT 33.4*  PLT 237   BMET:  Recent Labs  05/11/15 0415  NA 131*  K 4.2  CL 101  CO2 22  GLUCOSE 104*  BUN 11  CREATININE 0.90  CALCIUM 8.3*    PT/INR: No results for input(s): LABPROT, INR in the last 72 hours.    Assessment/Plan: S/P Procedure(s) (LRB): LEFT VIDEO ASSISTED THORACOSCOPY (VATS) WITH LEFT LUNG UPPER LOBE WEDGE RESECTION (Left) LEFT LUNG UPPER LOBECTOMY (Left) CRYO INTERCOSTAL  NERVE BLOCK (Left) LYMPH NODE DISSECTION (Left) CXR not done yet this am.  CT output decreasing, 150->50 over past 2 shifts.  Hopefully can d/c remaining CT soon. GI- nausea seems to be primary issue. Continue scheduled Reglan, prn Zofran and repeat LOC today. Will d/c narcotics.   LOS: 4 days    COLLINS,GINA H 05/13/2015   Chart reviewed, patient examined, agree with above. Chest tube output down so will remove the tube today.

## 2015-05-13 NOTE — Progress Notes (Signed)
Pt refuses to walk and increase use of IS, nursing encouraged and educated, pt unable to have BM after interventions done, pt cont to c/o abd pain, nursing will cont to monitor

## 2015-05-14 ENCOUNTER — Inpatient Hospital Stay (HOSPITAL_COMMUNITY): Payer: Medicare Other

## 2015-05-14 MED ORDER — METOCLOPRAMIDE HCL 5 MG/ML IJ SOLN
10.0000 mg | Freq: Four times a day (QID) | INTRAMUSCULAR | Status: DC
  Administered 2015-05-14: 10 mg via INTRAVENOUS
  Filled 2015-05-14 (×4): qty 2

## 2015-05-14 MED ORDER — METOCLOPRAMIDE HCL 5 MG/ML IJ SOLN
10.0000 mg | Freq: Four times a day (QID) | INTRAMUSCULAR | Status: AC
  Administered 2015-05-14 – 2015-05-15 (×4): 10 mg via INTRAVENOUS
  Filled 2015-05-14 (×4): qty 2

## 2015-05-14 MED ORDER — PROCHLORPERAZINE EDISYLATE 5 MG/ML IJ SOLN
10.0000 mg | Freq: Four times a day (QID) | INTRAMUSCULAR | Status: DC | PRN
  Administered 2015-05-14 – 2015-05-15 (×5): 10 mg via INTRAVENOUS
  Filled 2015-05-14 (×6): qty 2

## 2015-05-14 NOTE — Progress Notes (Addendum)
      [address]       [place] [address]             [phone]      5 Days Post-Op Procedure(s) (LRB): LEFT VIDEO ASSISTED THORACOSCOPY (VATS) WITH LEFT LUNG UPPER LOBE WEDGE RESECTION (Left) LEFT LUNG UPPER LOBECTOMY (Left) CRYO INTERCOSTAL NERVE BLOCK (Left) LYMPH NODE DISSECTION (Left)   Subjective:  Patient with continued nausea and vomiting, now with diarrhea.    Objective: Vital signs in last 24 hours: Temp:  [97.4 F (36.3 C)-98.7 F (37.1 C)] 97.9 F (36.6 C) (05/16 0744) Pulse Rate:  [70-100] 98 (05/16 0744) Cardiac Rhythm:  [-] Normal sinus rhythm (05/16 0744) Resp:  [15-21] 15 (05/16 0350) BP: (119-146)/(83-93) 119/90 mmHg (05/16 0744) SpO2:  [95 %-100 %] 95 % (05/16 0744)  Intake/Output from previous day: 05/15 0701 - 05/16 0700 In: 2565 [P.O.:840; I.V.:1725] Out: 1250 [Urine:550; Emesis/NG output:700] Intake/Output this shift: Total I/O In: -  Out: 200 [Emesis/NG output:200]  General appearance: alert, cooperative and no distress Heart: regular rate and rhythm Lungs: diminished breath sounds on left Abdomen: soft, non-tender; bowel sounds normal; no masses,  no organomegaly Wound: clean and dry  Lab Results: No results for input(s): WBC, HGB, HCT, PLT in the last 72 hours. BMET: No results for input(s): NA, K, CL, CO2, GLUCOSE, BUN, CREATININE, CALCIUM in the last 72 hours.  PT/INR: No results for input(s): LABPROT, INR in the last 72 hours. ABG    Component Value Date/Time   PHART 7.393 05/10/2015 0505   HCO3 24.2* 05/10/2015 0505   TCO2 25.5 05/10/2015 0505   ACIDBASEDEF 0.0 05/10/2015 0505   O2SAT 95.4 05/10/2015 0505   CBG (last 3)  No results for input(s): GLUCAP in the last 72 hours.  Assessment/Plan: S/P Procedure(s) (LRB): LEFT VIDEO ASSISTED THORACOSCOPY (VATS) WITH LEFT LUNG UPPER LOBE WEDGE RESECTION (Left) LEFT LUNG UPPER LOBECTOMY (Left) CRYO INTERCOSTAL NERVE BLOCK (Left) LYMPH NODE DISSECTION  (Left)  1. Pulm- off oxygen, no acute issues continue IS, CXR remains stable, possible trace pneumothorax on left 2. GI- continued nausea, vomiting, now with diarrhea-  diarrhea is likely due to 2 enemas patient received, ABD film is negative for Ileus, will add Compazine for additional nausea control 3. Dispo- patient stable, continued nausea, vomiting continue anti-emetics if diarrhea persists may need to check C. Diff    LOS: 5 days    Ellwood Handler 05/14/2015  Patient seen and examined, agree with above  Remo Lipps C. Roxan Hockey, MD [facility] [phone]

## 2015-05-14 NOTE — Progress Notes (Signed)
PT Cancellation Note  Patient Details Name: Tiffany [name] MRN: [id number] DOB: 16-Nov-1946   Cancelled Treatment:    Reason Eval/Treat Not Completed: Other (comment) (deferred due to pt nauseous and vomiting)  05/14/2015  Donnella Sham, PT [phone]  (pager) Ehren Berisha, [name] 05/14/2015, 5:57 PM

## 2015-05-14 NOTE — Evaluation (Signed)
Occupational Therapy Evaluation Patient Details Name: Tiffany [name] MRN: [id number] DOB: October 24, 1946 Today's Date: 05/14/2015    History of Present Illness Pt is a 69 y/o female presenting for surgery due to lung nodule, Status post left video-assisted thoracoscopy, wedge resection left upper.Marland Kitchen  PMHx; mild Charcot foot, B knee replacements, L hip pain, spinal fusion.   Clinical Impression   Pt admitted with the above diagnosis and has the deficits listed below. Pt would benefit from cont OT to increase safety and independence with all basic adls so she can soon d/c home with her husband.     Follow Up Recommendations  No OT follow up;Supervision - Intermittent    Equipment Recommendations  None recommended by OT    Recommendations for Other Services       Precautions / Restrictions Precautions Precautions: Fall Precaution Comments: Pt has used walker for last month. Restrictions Weight Bearing Restrictions: No      Mobility Bed Mobility               General bed mobility comments: not test OOB in chair  Transfers Overall transfer level: Needs assistance Equipment used: Rolling walker (2 wheeled) Transfers: Sit to/from [employer] Sit to Stand: Min guard Stand pivot transfers: Min assist       General transfer comment: safe use of hands    Balance Overall balance assessment: Needs assistance Sitting-balance support: Feet supported Sitting balance-Leahy Scale: Good     Standing balance support: Bilateral upper extremity supported;During functional activity Standing balance-Leahy Scale: Fair                              ADL Overall ADL's : Needs assistance/impaired Eating/Feeding: Set up;Sitting   Grooming: Set up;Sitting   Upper Body Bathing: Set up;Sitting   Lower Body Bathing: Minimal assistance;Sit to/from stand Lower Body Bathing Details (indicate cue type and reason): min assist to reach feet. Pt very nauseous and  not wanting to move much or reach for feet. Upper Body Dressing : Set up;Sitting   Lower Body Dressing: Minimal assistance;Sit to/from stand Lower Body Dressing Details (indicate cue type and reason): min assist to donn socks and shoes due to nausea and pain from chest tube site. Toilet Transfer: Minimal Scientist, forensic Details (indicate cue type and reason): min assist to steady pt. Pt unsteady and weak due to surgery and following N/V. Toileting- Clothing Manipulation and Hygiene: Minimal assistance;Sit to/from stand Toileting - Clothing Manipulation Details (indicate cue type and reason): min assist in standing for balance only.     Functional mobility during ADLs: Minimal assistance General ADL Comments: Pt able to do most adls but limited by not feeling well due to being up vomiting all night.  Eval limited due to this but pt still willing to participate at chair level.      Vision Vision Assessment?: No apparent visual deficits   Perception     Praxis      Pertinent Vitals/Pain Pain Assessment: No/denies pain     Hand Dominance Right   Extremity/Trunk Assessment Upper Extremity Assessment Upper Extremity Assessment: Overall WFL for tasks assessed   Lower Extremity Assessment Lower Extremity Assessment: Defer to PT evaluation   Cervical / Trunk Assessment Cervical / Trunk Assessment: Other exceptions (old neck surgery)   Communication Communication Communication: No difficulties   Cognition Arousal/Alertness: Awake/alert Behavior During Therapy: WFL for tasks assessed/performed Overall Cognitive Status: Within Functional Limits for tasks assessed  General Comments       Exercises       Shoulder Instructions      Home Living Family/patient expects to be discharged to:: Private residence Living Arrangements: Spouse/significant other Available Help at Discharge: Family;Available 24 hours/day Type of Home:  House Home Access: Stairs to enter [employer] of Steps: 1   Home Layout: One level     Bathroom Shower/Tub: Tub/shower unit;Curtain Shower/tub characteristics: Architectural technologist: Handicapped height Bathroom Accessibility: Yes How Accessible: Accessible via walker Home Equipment: Arial - 2 wheels;Grab bars - toilet;Grab bars - tub/shower          Prior Functioning/Environment Level of Independence: Independent        Comments: Pt has used walker for last months and at times needs assist with socks and shoes.    OT Diagnosis: Generalized weakness   OT Problem List: Decreased strength;Decreased activity tolerance;Decreased knowledge of use of DME or AE   OT Treatment/Interventions: Self-care/ADL training;Therapeutic activities;DME and/or AE instruction    OT Goals(Current goals can be found in the care plan section) Acute Rehab OT Goals Patient Stated Goal: Independent OT Goal Formulation: With patient Time For Goal Achievement: 05/28/15 Potential to Achieve Goals: Good ADL Goals Pt Will Perform Grooming: with modified independence;standing Pt Will Perform Lower Body Dressing: with supervision;sit to/from stand Pt Will Perform Tub/Shower Transfer: with modified independence;ambulating;Tub transfer;grab bars;rolling walker Additional ADL Goal #1: Pt will toilet with S on comfort height commode.  OT Frequency: Min 2X/week   Barriers to D/C:            Co-evaluation              End of Session Equipment Utilized During Treatment: Surveyor, mining Communication: Mobility status  Activity Tolerance: Other (comment) (limited by stomach issues.) Patient left: in chair;with call bell/phone within reach;with family/visitor present   Time: 0910-0927 OT Time Calculation (min): 17 min Charges:  OT General Charges $OT Visit: 1 Procedure OT Evaluation $Initial OT Evaluation Tier I: 1 Procedure G-Codes:    Glenford Peers Jun 05, 2015, 9:38 AM   [phone]

## 2015-05-14 NOTE — Progress Notes (Signed)
Medicare Important Message given? YES (If response is "NO", the following Medicare IM given date fields will be blank) Date Medicare IM given:05/14/15 Medicare IM given by: Whitman Hero

## 2015-05-15 ENCOUNTER — Inpatient Hospital Stay (HOSPITAL_COMMUNITY): Payer: Medicare Other

## 2015-05-15 LAB — BASIC METABOLIC PANEL
Anion gap: 7 (ref 5–15)
BUN: 30 mg/dL — AB (ref 6–20)
CHLORIDE: 96 mmol/L — AB (ref 101–111)
CO2: 26 mmol/L (ref 22–32)
CREATININE: 0.72 mg/dL (ref 0.44–1.00)
Calcium: 8 mg/dL — ABNORMAL LOW (ref 8.9–10.3)
GFR calc Af Amer: >60 mL/min (ref >60)
GLUCOSE: 98 mg/dL (ref 65–99)
POTASSIUM: 3.4 mmol/L — AB (ref 3.5–5.1)
SODIUM: 129 mmol/L — AB (ref 135–145)

## 2015-05-15 LAB — CBC
HCT: 18.5 % — ABNORMAL LOW (ref 36.0–46.0)
HCT: 19.3 % — ABNORMAL LOW (ref 36.0–46.0)
HEMOGLOBIN: 6.7 g/dL — AB (ref 12.0–15.0)
Hemoglobin: 6.4 g/dL — CL (ref 12.0–15.0)
MCH: 33 pg (ref 26.0–34.0)
MCH: 33 pg (ref 26.0–34.0)
MCHC: 34.6 g/dL (ref 30.0–36.0)
MCHC: 34.7 g/dL (ref 30.0–36.0)
MCV: 95.4 fL (ref 78.0–100.0)
MCV: 95.1 fL (ref 78.0–100.0)
PLATELETS: 219 10*3/uL (ref 150–400)
PLATELETS: 233 10*3/uL (ref 150–400)
RBC: 1.94 MIL/uL — ABNORMAL LOW (ref 3.87–5.11)
RBC: 2.03 MIL/uL — AB (ref 3.87–5.11)
RDW: 12.7 % (ref 11.5–15.5)
RDW: 12.6 % (ref 11.5–15.5)
WBC: 14.7 10*3/uL — ABNORMAL HIGH (ref 4.0–10.5)
WBC: 15.1 10*3/uL — AB (ref 4.0–10.5)

## 2015-05-15 LAB — HEMOGLOBIN AND HEMATOCRIT, BLOOD
HCT: 17.4 % — ABNORMAL LOW (ref 36.0–46.0)
Hemoglobin: 5.9 g/dL — CL (ref 12.0–15.0)

## 2015-05-15 LAB — FUNGUS CULTURE W SMEAR: FUNGAL SMEAR: NONE SEEN

## 2015-05-15 LAB — PREPARE RBC (CROSSMATCH)

## 2015-05-15 MED ORDER — SODIUM CHLORIDE 0.9 % IV SOLN: Freq: Once | INTRAVENOUS | Status: DC

## 2015-05-15 MED ORDER — MORPHINE SULFATE 2 MG/ML IJ SOLN
2.0000 mg | INTRAMUSCULAR | Status: DC | PRN
  Administered 2015-05-17: 2 mg via INTRAVENOUS
  Filled 2015-05-15: qty 1

## 2015-05-15 MED ORDER — PANTOPRAZOLE SODIUM 40 MG PO TBEC
40.0000 mg | DELAYED_RELEASE_TABLET | Freq: Two times a day (BID) | ORAL | Status: DC
  Administered 2015-05-15: 40 mg via ORAL
  Filled 2015-05-15: qty 1

## 2015-05-15 MED ORDER — PANTOPRAZOLE SODIUM 40 MG IV SOLR
40.0000 mg | Freq: Two times a day (BID) | INTRAVENOUS | Status: DC
  Administered 2015-05-15 – 2015-05-18 (×6): 40 mg via INTRAVENOUS
  Filled 2015-05-15 (×7): qty 40

## 2015-05-15 NOTE — Progress Notes (Signed)
6 Days Post-Op Procedure(s) (LRB): LEFT VIDEO ASSISTED THORACOSCOPY (VATS) WITH LEFT LUNG UPPER LOBE WEDGE RESECTION (Left) LEFT LUNG UPPER LOBECTOMY (Left) CRYO INTERCOSTAL NERVE BLOCK (Left) LYMPH NODE DISSECTION (Left) Subjective: Nausea resolved  Objective: Vital signs in last 24 hours: Temp:  [97.6 F (36.4 C)-99 F (37.2 C)] 97.7 F (36.5 C) (05/17 0700) Pulse Rate:  [71-106] 74 (05/17 0448) Cardiac Rhythm:  [-] Normal sinus rhythm (05/17 0744) Resp:  [17-24] 17 (05/17 0448) BP: (97-117)/(51-92) 99/68 mmHg (05/17 0448) SpO2:  [92 %-100 %] 98 % (05/17 0448)  Hemodynamic parameters for last 24 hours:    Intake/Output from previous day: 05/16 0701 - 05/17 0700 In: 480 [P.O.:480] Out: 950 [Urine:350; Emesis/NG output:600] Intake/Output this shift:    General appearance: alert and no distress Neurologic: intact Heart: regular rate and rhythm Lungs: diminished breath sounds bibasilar Abdomen: normal findings: soft, non-tender Wound: clean and dry  Lab Results:  Recent Labs  05/15/15 0530  WBC 14.7*  HGB 6.4*  HCT 18.5*  PLT 219   BMET:  Recent Labs  05/15/15 0530  NA 129*  K 3.4*  CL 96*  CO2 26  GLUCOSE 98  BUN 30*  CREATININE 0.72  CALCIUM 8.0*    PT/INR: No results for input(s): LABPROT, INR in the last 72 hours. ABG    Component Value Date/Time   PHART 7.393 05/10/2015 0505   HCO3 24.2* 05/10/2015 0505   TCO2 25.5 05/10/2015 0505   ACIDBASEDEF 0.0 05/10/2015 0505   O2SAT 95.4 05/10/2015 0505   CBG (last 3)  No results for input(s): GLUCAP in the last 72 hours.  Assessment/Plan: S/P Procedure(s) (LRB): LEFT VIDEO ASSISTED THORACOSCOPY (VATS) WITH LEFT LUNG UPPER LOBE WEDGE RESECTION (Left) LEFT LUNG UPPER LOBECTOMY (Left) CRYO INTERCOSTAL NERVE BLOCK (Left) LYMPH NODE DISSECTION (Left) POD # 5  Nausea improved- if tolerates diet will dc IV  Ambulation  PATH- T1aN0- stage IA adenocarcinoma- will not need adjuvant therapy- patient  informed  Anemia- Hgb 6.4 down from 10.7- I suspect this is lab error- repeat has been sent  If able to tolerate POs today will dc tomorrow AM   LOS: 6 days    [name] 05/15/2015

## 2015-05-15 NOTE — Progress Notes (Signed)
I have received a request for  GI consult concerning  possible GIB  in this pt with initial Hgb >11.0, post op drift down  to 5.9 this afternoon. She has a BUN of 30 ( baseline BUN 17-19). I suspects UGI bleed. So far she is hemodynamically stable, We  will plan  EGD in am, will reassess in am. Please call tonight is she becomes hypotensive.due to large amount of bleeding.  [facility] [phone]

## 2015-05-15 NOTE — Progress Notes (Signed)
CRITICAL VALUE ALERT  Critical value received:  hgb 5.9  Date of notification:  05/15/15  Time of notification:  5379  Critical value read back:Yes.    Nurse who received alert:  Carver Fila, RN  MD notified (1st page):  [place], [place] T[facility]  Time of first page:  1650  MD notified (2nd page):  Time of second page:  Responding MD:  Tessa Lerner  Time MD responded:  [phone]

## 2015-05-15 NOTE — Progress Notes (Signed)
Paged Ellwood Handler PA re: hgb 6.7 recheck from this am. Will continue to monitor for now and Replace K+, if any stool or emesis to send for hemoccult. Will recheck another cbc at 1600

## 2015-05-15 NOTE — Evaluation (Signed)
Physical Therapy Evaluation Patient Details Name: Tiffany [name] MRN: [id number] DOB: 01-23-1946 Today's Date: 05/15/2015   History of Present Illness  Pt is a 69 y/o female presenting for surgery due to lung nodule, Status post left video-assisted thoracoscopy, wedge resection left upper.Marland Kitchen  PMHx; mild Charcot foot, B knee replacements, L hip pain, spinal fusion.  Clinical Impression   Despite low Hgb and K+, pt managed ambulation decently well, but we kept it simple and rested when needed.  All is set for d/c from therapy perspective.  It would be beneficial for pt to go to pulmonary rehab when she is ready.     Follow Up Recommendations No PT follow up;Supervision for mobility/OOB (pt could benefit from pulmonary rehab; pt does not want HHPT)    Equipment Recommendations  None recommended by PT    Recommendations for Other Services       Precautions / Restrictions Precautions Precautions: Fall Precaution Comments: Pt has used walker for last month. Restrictions Weight Bearing Restrictions: No      Mobility  Bed Mobility               General bed mobility comments: not test OOB in chair  Transfers Overall transfer level: Needs assistance Equipment used: Rolling walker (2 wheeled) Transfers: Sit to/from Stand Sit to Stand: Min guard (min assist from lowest surfaces.)         General transfer comment: safe use of hands  Ambulation/Gait Ambulation/Gait assistance: Min guard;Supervision Ambulation Distance (Feet): 60 Feet (80 after a rest.) Assistive device: Rolling walker (2 wheeled) Gait Pattern/deviations: Step-to pattern Gait velocity: slower   General Gait Details: mildly unsteady, Moderately dyspneic given low Hgb.  Stairs            Wheelchair Mobility    Modified Rankin (Stroke Patients Only)       Balance Overall balance assessment: Needs assistance   Sitting balance-Leahy Scale: Good     Standing balance support: No upper  extremity supported Standing balance-Leahy Scale: Fair                               Pertinent Vitals/Pain Pain Assessment: No/denies pain    Home Living                        Prior Function                 Hand Dominance        Extremity/Trunk Assessment                         Communication      Cognition   Behavior During Therapy: WFL for tasks assessed/performed Overall Cognitive Status: Within Functional Limits for tasks assessed                      General Comments General comments (skin integrity, edema, etc.): SpO2 maintained at 92/93 % on RA, EHR in the 90's bpm    Exercises        Assessment/Plan    PT Assessment    PT Diagnosis     PT Problem List    PT Treatment Interventions     PT Goals (Current goals can be found in the Care Plan section) Acute Rehab PT Goals Patient Stated Goal: Independent PT Goal Formulation: With patient Time For Goal Achievement: 05/18/15 Potential to Achieve  Goals: Good    Frequency Min 3X/week   Barriers to discharge        Co-evaluation               End of Session   Activity Tolerance: Patient tolerated treatment well;Patient limited by fatigue Patient left: in chair;with call bell/phone within reach Nurse Communication: Mobility status         Time: 1610-9604 PT Time Calculation (min) (ACUTE ONLY): 18 min   Charges:     PT Treatments $Gait Training: 8-22 mins   PT G Codes:        Tiffany [name], Eliseo Gum 05/15/2015, 10:47 AM  05/15/2015  [place] Bing, PT [phone]  (pager)

## 2015-05-16 ENCOUNTER — Encounter (HOSPITAL_COMMUNITY)
Admission: RE | Disposition: A | Discharge status: Home / Self Care | Payer: Self-pay | Source: Ambulatory Visit | Attending: Thoracic Surgery (Cardiothoracic Vascular Surgery)

## 2015-05-16 ENCOUNTER — Encounter (HOSPITAL_COMMUNITY): Payer: Self-pay | Admitting: Physician Assistant

## 2015-05-16 DIAGNOSIS — T39395A Adverse effect of other nonsteroidal anti-inflammatory drugs [NSAID], initial encounter: Secondary | ICD-10-CM

## 2015-05-16 DIAGNOSIS — K259 Gastric ulcer, unspecified as acute or chronic, without hemorrhage or perforation: Secondary | ICD-10-CM | POA: Insufficient documentation

## 2015-05-16 DIAGNOSIS — D62 Acute posthemorrhagic anemia: Secondary | ICD-10-CM

## 2015-05-16 DIAGNOSIS — K209 Esophagitis, unspecified without bleeding: Secondary | ICD-10-CM | POA: Insufficient documentation

## 2015-05-16 DIAGNOSIS — K922 Gastrointestinal hemorrhage, unspecified: Secondary | ICD-10-CM

## 2015-05-16 DIAGNOSIS — K264 Chronic or unspecified duodenal ulcer with hemorrhage: Secondary | ICD-10-CM | POA: Insufficient documentation

## 2015-05-16 HISTORY — PX: ESOPHAGOGASTRODUODENOSCOPY: SHX5428

## 2015-05-16 LAB — CBC
HEMATOCRIT: 24.3 % — AB (ref 36.0–46.0)
HEMOGLOBIN: 8.5 g/dL — AB (ref 12.0–15.0)
MCH: 30.8 pg (ref 26.0–34.0)
MCHC: 35 g/dL (ref 30.0–36.0)
MCV: 88 fL (ref 78.0–100.0)
Platelets: 230 10*3/uL (ref 150–400)
RBC: 2.76 MIL/uL — AB (ref 3.87–5.11)
RDW: 16.8 % — ABNORMAL HIGH (ref 11.5–15.5)
WBC: 11.9 10*3/uL — ABNORMAL HIGH (ref 4.0–10.5)

## 2015-05-16 LAB — TYPE AND SCREEN
ABO/RH(D): O POS
Antibody Screen: NEGATIVE
Unit division: 0
Unit division: 0

## 2015-05-16 LAB — HEMOGLOBIN AND HEMATOCRIT, BLOOD
HCT: 25.7 % — ABNORMAL LOW (ref 36.0–46.0)
Hemoglobin: 8.7 g/dL — ABNORMAL LOW (ref 12.0–15.0)

## 2015-05-16 SURGERY — EGD (ESOPHAGOGASTRODUODENOSCOPY)
Anesthesia: Moderate Sedation

## 2015-05-16 MED ORDER — EPINEPHRINE HCL 0.1 MG/ML IJ SOSY
PREFILLED_SYRINGE | INTRAMUSCULAR | Status: DC | PRN
  Administered 2015-05-16: 5 mL

## 2015-05-16 MED ORDER — BUTAMBEN-TETRACAINE-BENZOCAINE 2-2-14 % EX AERO
INHALATION_SPRAY | CUTANEOUS | Status: DC | PRN
Start: 2015-05-16 — End: 2015-05-16
  Administered 2015-05-16: 2 via TOPICAL

## 2015-05-16 MED ORDER — MIDAZOLAM HCL 5 MG/ML IJ SOLN
INTRAMUSCULAR | Status: AC
Start: 2015-05-16 — End: 2015-05-16
  Filled 2015-05-16: qty 2

## 2015-05-16 MED ORDER — DIPHENHYDRAMINE HCL 50 MG/ML IJ SOLN
INTRAMUSCULAR | Status: AC
  Filled 2015-05-16: qty 1

## 2015-05-16 MED ORDER — FENTANYL CITRATE (PF) 100 MCG/2ML IJ SOLN
INTRAMUSCULAR | Status: DC | PRN
  Administered 2015-05-16: 25 ug via INTRAVENOUS

## 2015-05-16 MED ORDER — EPINEPHRINE HCL 0.1 MG/ML IJ SOSY
PREFILLED_SYRINGE | INTRAMUSCULAR | Status: AC
  Filled 2015-05-16: qty 10

## 2015-05-16 MED ORDER — SODIUM CHLORIDE 0.9 % IV SOLN: INTRAVENOUS | Status: DC

## 2015-05-16 MED ORDER — FENTANYL CITRATE (PF) 100 MCG/2ML IJ SOLN
INTRAMUSCULAR | Status: AC
  Filled 2015-05-16: qty 2

## 2015-05-16 MED ORDER — MIDAZOLAM HCL 10 MG/2ML IJ SOLN
INTRAMUSCULAR | Status: DC | PRN
Start: 2015-05-16 — End: 2015-05-16
  Administered 2015-05-16: 2 mg via INTRAVENOUS

## 2015-05-16 NOTE — Progress Notes (Signed)
OT Cancellation Note  Patient Details Name: Tiffany [name] MRN: [id number] DOB: [birth year]   Cancelled Treatment:    Reason Eval/Treat Not Completed: Patient at procedure or test/ unavailable  Benito Mccreedy OTR/L [phone] 05/16/2015, 4:26 PM

## 2015-05-16 NOTE — Progress Notes (Signed)
7 Days Post-Op Procedure(s) (LRB): LEFT VIDEO ASSISTED THORACOSCOPY (VATS) WITH LEFT LUNG UPPER LOBE WEDGE RESECTION (Left) LEFT LUNG UPPER LOBECTOMY (Left) CRYO INTERCOSTAL NERVE BLOCK (Left) LYMPH NODE DISSECTION (Left) Subjective: Feels better this AM Denies nausea, melena  Objective: Vital signs in last 24 hours: Temp:  [97.3 F (36.3 C)-98.3 F (36.8 C)] 97.7 F (36.5 C) (05/18 0730) Pulse Rate:  [67-109] 89 (05/18 0730) Cardiac Rhythm:  [-] Normal sinus rhythm (05/18 0730) Resp:  [13-28] 26 (05/18 0730) BP: (77-156)/(40-134) 99/58 mmHg (05/18 0730) SpO2:  [97 %-100 %] 98 % (05/18 0730)  Hemodynamic parameters for last 24 hours:    Intake/Output from previous day: 05/17 0701 - 05/18 0700 In: 2126.8 [P.O.:240; I.V.:975; Blood:761.8; IV Piggyback:150] Out: 1450 [Urine:1450] Intake/Output this shift:    General appearance: alert, cooperative and no distress Neurologic: intact Heart: regular rate and rhythm Lungs: diminished breath sounds left base Abdomen: normal findings: soft, non-tender  Lab Results:  Recent Labs  05/15/15 0750 05/15/15 1630 05/16/15 0450  WBC 15.1*  --  11.9*  HGB 6.7* 5.9* 8.5*  HCT 19.3* 17.4* 24.3*  PLT 233  --  230   BMET:  Recent Labs  05/15/15 0530  NA 129*  K 3.4*  CL 96*  CO2 26  GLUCOSE 98  BUN 30*  CREATININE 0.72  CALCIUM 8.0*    PT/INR: No results for input(s): LABPROT, INR in the last 72 hours. ABG    Component Value Date/Time   PHART 7.393 05/10/2015 0505   HCO3 24.2* 05/10/2015 0505   TCO2 25.5 05/10/2015 0505   ACIDBASEDEF 0.0 05/10/2015 0505   O2SAT 95.4 05/10/2015 0505   CBG (last 3)  No results for input(s): GLUCAP in the last 72 hours.  Assessment/Plan: S/P Procedure(s) (LRB): LEFT VIDEO ASSISTED THORACOSCOPY (VATS) WITH LEFT LUNG UPPER LOBE WEDGE RESECTION (Left) LEFT LUNG UPPER LOBECTOMY (Left) CRYO INTERCOSTAL NERVE BLOCK (Left) LYMPH NODE DISSECTION (Left) -  CV- stable  RESP- s/p  lobectomy, stable  RENAL- BUN up likely secondary to GIB  GI- nausea improved- suspect she had a GI bleed- GI to see today  On protonix 40 mg BID  Anemia secondary to ABL- improved after transfusion, monitor   LOS: 7 days    Tiffany [name] 05/16/2015

## 2015-05-16 NOTE — Consult Note (Signed)
[facility] Consult: 9:30 AM 05/16/2015  LOS: 7 days    Referring Provider: Dr Roxan Hockey  Primary Care Physician:  [name], MD Primary Gastroenterologist:  Dr. Sharlett Iles.   Spouse: Jeff Chittenden  [phone].     Reason for Consultation:  Acute anemia.    HPI: Tiffany [name] is a 69 y.o. female.   Hx glucose intolerance, obesity, neuropathy. DJD/OA/charcot foot, s/p numerous orthopedic surgeries.  12/2006  Colonoscopy: Dr Sharlett Iles:  2 HP polyps.  S/p 05/09/15 Thoracoscopy LUL lobectomy/node disection/nerve cryoanelgesia for adenocarcinoma.   Hgb drifted from 11 pre-op to 5.9 on 5/17. Hypotensive. S/p PRBCs x 2 with Hgb to 8.5 today.BUN shot from 11 to 30 over 5 days with stable creatinine.  PT/INR ok.   Meds include Lovenox, discontinued and last dose 10AM yesterday.  Chronic po Voltaren.  BID Protonix, now IV (no PPI at home).   At home no GI issues.  Since surgery has had abdominal bloating and received multiple oral laxatives, reglan, fleets enema and on Sunday had large BMs.  Has eaten maybe one meal in the last week.  Vomited 2 days ago after drinking prune juice, of course it looked dark.   With the Hgb drop and hypotension yesterday, she just felt weak but not dizzy or presyncopal.      Past Medical History  Diagnosis Date  . High blood pressure   . Gait difficulty     problems with balance  . Hyperlipidemia   . Pre-diabetes   . Vitamin D deficiency   . Obesity (BMI 35.0-39.9 without comorbidity)   . Neuropathy   . Hyperthyroidism mild  . Arthritis     Past Surgical History  Procedure Laterality Date  . Vaginal hysterectomy    . Cervical disc arthroplasty    . Rotator cuff repair    . Spinal fusion    . Tonsillectomy    . Knee replaced      right knee x 2  . Video  bronchoscopy with endobronchial navigation N/A 04/18/2015    Procedure: VIDEO BRONCHOSCOPY WITH ENDOBRONCHIAL NAVIGATION;  Surgeon: Collene Gobble, MD;  Location: [place];  Service: Thoracic;  Laterality: N/A;  . Video bronchoscopy with endobronchial ultrasound N/A 04/18/2015    Procedure: VIDEO BRONCHOSCOPY WITH ENDOBRONCHIAL ULTRASOUND;  Surgeon: Collene Gobble, MD;  Location: [place];  Service: Thoracic;  Laterality: N/A;  . Video assisted thoracoscopy (vats)/wedge resection Left 05/09/2015    Procedure: LEFT VIDEO ASSISTED THORACOSCOPY (VATS) WITH LEFT LUNG UPPER LOBE WEDGE RESECTION;  Surgeon: [name], MD;  Location: [place];  Service: Thoracic;  Laterality: Left;  . Lobectomy Left 05/09/2015    Procedure: LEFT LUNG UPPER LOBECTOMY;  Surgeon: [name], MD;  Location: [place];  Service: Thoracic;  Laterality: Left;  . Cryo intercostal nerve block Left 05/09/2015    Procedure: CRYO INTERCOSTAL NERVE BLOCK;  Surgeon: [name], MD;  Location: [place];  Service: Thoracic;  Laterality: Left;  . Lymph node dissection Left 05/09/2015    Procedure: LYMPH NODE DISSECTION;  Surgeon: [name], MD;  Location: [place];  Service: Thoracic;  Laterality: Left;    Prior to Admission medications   Medication Sig Start Date End Date Taking? Authorizing Provider  atenolol (TENORMIN) 100 MG tablet TAKE 1 TABLET BY MOUTH DAILY FOR BLOOD PRESSURE Patient taking differently: TAKE 1/2 TABLET BY MOUTH DAILY FOR BLOOD PRESSURE 04/25/14  Yes Vicie Mutters, PA-C  buPROPion [facility] SR) 150 MG 12 hr tablet TAKE 1 TABLET BY MOUTH TWICE DAILY 12/27/14  Yes Unk Pinto, MD  Cholecalciferol (VITAMIN D PO) Take 5,000 Units by mouth 2 (two) times daily.    Yes Historical Provider, MD  diclofenac (VOLTAREN) 75 MG EC tablet TAKE 1 TABLET BY MOUTH TWICE DAILY 01/26/15  Yes Unk Pinto, MD  diphenhydrAMINE (BENADRYL) 25 MG tablet Take 25 mg by mouth every 8 (eight) hours as needed for  allergies.   Yes Historical Provider, MD  enalapril (VASOTEC) 20 MG tablet TAKE 1 TABLET BY MOUTH DAILY FOR HIGH BLOOD PRESSURE 12/27/14  Yes Unk Pinto, MD  furosemide (LASIX) 40 MG tablet Take 40 mg by mouth daily.   Yes Historical Provider, MD  HYDROcodone-acetaminophen (NORCO/VICODIN) 5-325 MG per tablet Take 1 tablet by mouth every 8 (eight) hours as needed for moderate pain.    Yes Historical Provider, MD  Multiple Vitamins-Minerals (MULTIVITAMIN PO) Take by mouth.   Yes Historical Provider, MD  pravastatin (PRAVACHOL) 40 MG tablet TAKE 1 TABLET BY MOUTH EVERY NIGHT AT BEDTIME FOR CHOLESTEROL 02/08/15  Yes Vicie Mutters, PA-C    Scheduled Meds: . sodium chloride   Intravenous Once  . antiseptic oral rinse  7 mL Mouth Rinse BID  . atenolol  50 mg Oral Daily  . buPROPion  150 mg Oral BID  . pantoprazole (PROTONIX) IV  40 mg Intravenous Q12H  . pravastatin  40 mg Oral q1800   Infusions: . dextrose 5 % and 0.45 % NaCl with KCl 20 mEq/L 75 mL/hr at 05/15/15 1500   PRN Meds: albuterol, morphine injection, ondansetron (ZOFRAN) IV, potassium chloride, prochlorperazine, traMADol   Allergies as of 05/01/2015  . (No Known Allergies)    Family History  Problem Relation Age of Onset  . Heart attack Father   . Hypertension Mother   . Breast cancer Maternal Aunt   . Colon cancer Paternal Grandmother     History   Social History  . Marital Status: Married    Spouse Name: Merry Proud  . Number of Children: 1  . Years of Education: 12   Occupational History  . retired    Social History Main Topics  . Smoking status: Current Every Day Smoker -- 1.00 packs/day for 30 years    Types: Cigarettes  . Smokeless tobacco: Never Used     Comment: currently 1/2 ppd  . Alcohol Use: 0.6 oz/week    1 Glasses of wine per week     Comment: 2 glasses wine daily  . Drug Use: No  . Sexual Activity: Not on file   Other Topics Concern  . Not on file   Social History Narrative   Patient  lives at home with her husband Merry Proud). Patient is retired she use to work with insurance. Patient has a high school education.   Right handed.    REVIEW OF SYSTEMS: Constitutional:  Stable weight.  Weak, tired ENT:  No nose bleeds Pulm:  Breathing remarkably good.  No SOB.  Some cough CV:  No palpitations, no LE edema.  GU:  No hematuria, no frequency  GI:  Per HPI Heme:  No issues with bleeding or low blood counts in past   Transfusions:  None before the last 18 hours.  Neuro:  No headaches, no peripheral tingling or numbness Derm:  No itching, no rash or sores.  Endocrine:  No sweats or chills.  No polyuria or dysuria Immunization:  Not queried.  Travel:  None beyond local counties in last few months.    PHYSICAL EXAM: Vital signs in last 24 hours: Filed Vitals:   05/16/15 0730  BP: 99/58  Pulse: 89  Temp: 97.7 F (36.5 C)  Resp: 26   Wt Readings from Last 3 Encounters:  05/09/15 203 lb 11.3 oz (92.4 kg)  05/07/15 200 lb 1.6 oz (90.765 kg)  05/01/15 204 lb (92.534 kg)    General: pleasant, overweight, comfortable but looks better than expected, not acutely ill looking Head:  No swelling or asymmetry.  No head trauma  Eyes:  No icterus or conj pallor.  Ears:  Not HOH  Nose:  No congestion or discharge Mouth:  Clear, moist mm.  Neck:  No TMG, no mass, no JVD Lungs:  Some crackles in left base, o/w clear.  No dyspnea.  Slight cough.   Heart: RRR.  No MRG Abdomen:  Soft, obese.  A few small bruises from shots in lower abdomen. .   Rectal: deferred.  Pt in lounge chair.    Musc/Skeltl: no joint swelling.  Post op changes of knees.  Charcot feet.   Extremities:  Erythema and swelling, no pitting in bil LE  Neurologic:  Oriented x 3.   Good historian.  UE tremor at rest.  Limb strength not tested, moves all 4s.  Skin:  LE erythema.  No telangectasia.  Tattoos:  none Nodes:  No cervical adenopathy.    Psych:  Pleasant, relaxed in good spirits.   Intake/Output from  previous day: 05/17 0701 - 05/18 0700 In: 2126.8 [P.O.:240; I.V.:975; Blood:761.8; IV Piggyback:150] Out: 1450 [Urine:1450] Intake/Output this shift:    LAB RESULTS:  Recent Labs  05/15/15 0530 05/15/15 0750 05/15/15 1630 05/16/15 0450  WBC 14.7* 15.1*  --  11.9*  HGB 6.4* 6.7* 5.9* 8.5*  HCT 18.5* 19.3* 17.4* 24.3*  PLT 219 233  --  230   BMET Lab Results  Component Value Date   NA 129* 05/15/2015   NA 131* 05/11/2015   NA 132* 05/10/2015   K 3.4* 05/15/2015   K 4.2 05/11/2015   K 4.6 05/10/2015   CL 96* 05/15/2015   CL 101 05/11/2015   CL 102 05/10/2015   CO2 26 05/15/2015   CO2 22 05/11/2015   CO2 24 05/10/2015   GLUCOSE 98 05/15/2015   GLUCOSE 104* 05/11/2015   GLUCOSE 140* 05/10/2015   BUN 30* 05/15/2015   BUN 11 05/11/2015   BUN 10 05/10/2015   CREATININE 0.72 05/15/2015   CREATININE 0.90 05/11/2015   CREATININE 0.76 05/10/2015   CALCIUM 8.0* 05/15/2015   CALCIUM 8.3* 05/11/2015   CALCIUM 8.3* 05/10/2015   LFT No results for input(s): PROT, ALBUMIN, AST, ALT, ALKPHOS, BILITOT, BILIDIR, IBILI in the last 72 hours. PT/INR Lab Results  Component Value Date   INR 0.96 05/07/2015   INR 2.2* 05/26/2009   INR 3.0* 05/25/2009    RADIOLOGY STUDIES: Dg Chest Port 1 View  05/15/2015   CLINICAL DATA:  Post lobectomy.  Lung cancer.  EXAM: PORTABLE CHEST - 1 VIEW  COMPARISON:  05/14/2015  FINDINGS: Right IJ central venous catheter unchanged. Postsurgical  change of the left hemi thorax compatible with recent lobectomy for lung cancer. Mild prominence of the perihilar vasculature suggesting minimal vascular congestion. Continued subtle linear density paralleling the lateral chest wall over the left upper thorax likely likely post thoracotomy overlapping bony and soft tissue structures, although focal tiny pneumothorax is not excluded. Cardiomediastinal silhouette and remainder of the exam is unchanged.  IMPRESSION: Postsurgical changes of the left hemi thorax  compatible with recent lobectomy for lung cancer stable subtle linear density over the lateral left upper thorax likely postsurgical although cannot exclude a tiny pneumothorax.  Mild prominence of the perihilar markings suggesting minimal vascular congestion.   Electronically Signed   By: Marin Olp M.D.   On: 05/15/2015 08:33    ENDOSCOPIC STUDIES: Per HPI  IMPRESSION:   *  Suspect UGIB.  Suspect Voltaren induced ulcer.  On IV BID Protonix now but no PPI at home.  *  Lung cancer.  S/p LUL lobectomy. No hemithorax on 5/17 CXR.   *  Constipation, bloating.  Multiple stools over the weekend.  Last Fentanyl 5/13.   PLAN:     *  Stop Voltaren.  EGD today. Hgb this afternoon, CBC in AM.  *  EGD this afternoon.     Azucena Freed  05/16/2015, 9:30 AM Pager: [phone]  GI ATTENDING  History, laboratories, operative reports, x-rays reviewed. Patient personally seen and examined. Agree with complete consultation note as outlined above. Patient with recent thoracic surgery as described. Has had significant drop in hemoglobin. Suspect GI bleed with elevation of BUN and negative chest x-ray for hemothorax. However, not having significant bowel movements or melena, which he might expect. In any event, planned for urgent EGD later today.The nature of the procedure, as well as the risks, benefits, and alternatives were carefully and thoroughly reviewed with the patient. Ample time for discussion and questions allowed. The patient understood, was satisfied, and agreed to proceed.  Docia Chuck. Geri Seminole., M.D. [facility] Division of Gastroenterology.

## 2015-05-16 NOTE — Op Note (Signed)
[facility] [place], [address]   ENDOSCOPY PROCEDURE REPORT  PATIENT: Tiffany [name], Tiffany [name]  MR#: [id number] BIRTHDATE: 1946/04/14 , 28  yrs. old GENDER: female ENDOSCOPIST: Eustace Quail, MD REFERRED BY:  Merilynn Finland, M.D. PROCEDURE DATE:  05/16/2015 PROCEDURE:  EGD w/ control of bleeding and EGD w/ biopsy ASA CLASS:     Class III INDICATIONS:  hematemesis and anemia. MEDICATIONS: Fentanyl 50 mcg IV and Versed 6 mg IV TOPICAL ANESTHETIC: Cetacaine Spray DESCRIPTION OF PROCEDURE: After the risks benefits and alternatives of the procedure were thoroughly explained, informed consent was obtained.  The Pentax Gastroscope [id number] endoscope was introduced through the mouth and advanced to the second portion of the duodenum , Without limitations.  The instrument was slowly withdrawn as the mucosa was fully examined.  EXAM:The esophagus revealed exudative and friable mucosa throughout the distal one half.  Stomach revealed multiple punctate ulcers in the prepyloric region.  The duodenal bulb revealed multiple superficial ulcers.  Between the first and second portion of the duodenum was a deep and giant ulcer measuring 4 cm in diameter. There was friability with oozing on one edge.  No focal vascular lesion.  This was treated with 1-10,000 epinephrine injection (5 mL total) with hemostasis.  Post bulbar duodenum revealed multiple ulcers between 5 and 10 mm.  CLO biopsy taken.  Retroflexed views revealed a hiatal hernia.     The scope was then withdrawn from the patient and the procedure completed. COMPLICATIONS: There were no immediate complications.  ENDOSCOPIC IMPRESSION: 1. Ulcerative esophagitis 2. Small prepyloric gastric ulcers 3. Multiple duodenal ulcers including giant ulcer between first and second portion requiring hemostatic therapy  RECOMMENDATIONS: 1.  Continue PPI (pantoprazole 40 mg IV every 12 hours for 3 days then  convert to oral pantoprazole 40 mg twice a day) 2.  Avoid NSAIDS indefinitely 3.  Rx CLO if positive  REPEAT EXAM:  eSigned:  Eustace Quail, MD 05/16/2015 3:55 PM    CC:The Patient and Unk Pinto, MD  PATIENT NAME:  [name], [name] MR#: [id number]

## 2015-05-16 NOTE — Progress Notes (Signed)
Late entry: Pt received a total of 50 mcg of fentanyl and 5 mg of versed for EGD.

## 2015-05-16 NOTE — Progress Notes (Signed)
Medicare Important Message given? YES (If response is "NO", the following Medicare IM given date fields will be blank) Date Medicare IM given:05/16/15 Medicare IM given by: Kerem Gilmer 

## 2015-05-17 ENCOUNTER — Inpatient Hospital Stay (HOSPITAL_COMMUNITY): Payer: Medicare Other

## 2015-05-17 ENCOUNTER — Encounter (HOSPITAL_COMMUNITY): Payer: Self-pay | Admitting: Internal Medicine

## 2015-05-17 LAB — CBC
HEMATOCRIT: 22.7 % — AB (ref 36.0–46.0)
HEMOGLOBIN: 7.9 g/dL — AB (ref 12.0–15.0)
MCH: 31.2 pg (ref 26.0–34.0)
MCHC: 34.8 g/dL (ref 30.0–36.0)
MCV: 89.7 fL (ref 78.0–100.0)
Platelets: 277 10*3/uL (ref 150–400)
RBC: 2.53 MIL/uL — ABNORMAL LOW (ref 3.87–5.11)
RDW: 16.6 % — ABNORMAL HIGH (ref 11.5–15.5)
WBC: 8.7 10*3/uL (ref 4.0–10.5)

## 2015-05-17 LAB — CLOTEST (H. PYLORI), BIOPSY: Helicobacter screen: NEGATIVE

## 2015-05-17 MED ORDER — PANTOPRAZOLE SODIUM 40 MG PO TBEC: 40.0000 mg | DELAYED_RELEASE_TABLET | Freq: Every day | ORAL | Status: DC

## 2015-05-17 MED ORDER — FUROSEMIDE 40 MG PO TABS
40.0000 mg | ORAL_TABLET | Freq: Every day | ORAL | Status: DC
  Administered 2015-05-17 – 2015-05-18 (×2): 40 mg via ORAL
  Filled 2015-05-17 (×2): qty 1

## 2015-05-17 MED ORDER — PANTOPRAZOLE SODIUM 40 MG PO TBEC: 40.0000 mg | DELAYED_RELEASE_TABLET | Freq: Two times a day (BID) | ORAL | Status: DC

## 2015-05-17 MED ORDER — NYSTATIN 100000 UNIT/ML MT SUSP
5.0000 mL | Freq: Four times a day (QID) | OROMUCOSAL | Status: DC
  Administered 2015-05-17 – 2015-05-18 (×4): 500000 [IU] via ORAL
  Filled 2015-05-17 (×6): qty 5

## 2015-05-17 MED ORDER — POTASSIUM CHLORIDE CRYS ER 20 MEQ PO TBCR
20.0000 meq | EXTENDED_RELEASE_TABLET | Freq: Every day | ORAL | Status: DC
  Administered 2015-05-17 – 2015-05-18 (×2): 20 meq via ORAL
  Filled 2015-05-17 (×2): qty 1

## 2015-05-17 NOTE — Progress Notes (Signed)
UR COMPLETED  

## 2015-05-17 NOTE — Progress Notes (Signed)
Occupational Therapy Treatment Patient Details Name: Tiffany [name] MRN: [id number] DOB: [birth year] Today's Date: 05/17/2015    History of present illness Pt is a 69 y/o female presenting for surgery due to lung nodule, Status post left video-assisted thoracoscopy, wedge resection left upper.Marland Kitchen  PMHx; mild Charcot foot, B knee replacements, L hip pain, spinal fusion.   OT comments  Pt progressing toward goals.  She is able to perform ADLs with min guard assist, but is limited by pain Lt hip.  Pt also fatigues quickly requiring frequent rest breaks.   Follow Up Recommendations  No OT follow up;Supervision - Intermittent    Equipment Recommendations  None recommended by OT    Recommendations for Other Services      Precautions / Restrictions Precautions Precautions: Fall       Mobility Bed Mobility                  Transfers Overall transfer level: Needs assistance Equipment used: Rolling walker (2 wheeled) Transfers: Sit to/from [employer] Sit to Stand: Min guard Stand pivot transfers: Min guard            Balance Overall balance assessment: Needs assistance Sitting-balance support: Feet supported Sitting balance-Leahy Scale: Good     Standing balance support: During functional activity Standing balance-Leahy Scale: Fair                     ADL               Lower Body Bathing: Sit to/from stand;Min guard       Lower Body Dressing: Min guard;Sit to/from stand Lower Body Dressing Details (indicate cue type and reason): Pt able to don/doff socks sitting.  Min gaurd assist to simulate pulling pants over hips  Toilet Transfer: Min guard;Ambulation;Comfort height toilet;RW   Toileting- Architect and Hygiene: Min guard;Sit to/from stand         General ADL Comments: Pt limited by Lt hip pain       Vision                     Perception     Praxis      Cognition   Behavior During Therapy: [facility]  for tasks assessed/performed Overall Cognitive Status: Within Functional Limits for tasks assessed                       Extremity/Trunk Assessment               Exercises     Shoulder Instructions       General Comments      Pertinent Vitals/ Pain       Pain Assessment: 0-10 Pain Score: 8  Faces Pain Scale: Hurts whole lot Pain Location: Lt hip  Pain Descriptors / Indicators: Aching;Grimacing;Guarding;Pounding;Stabbing Pain Intervention(s): Limited activity within patient's tolerance;Repositioned  Home Living                                          Prior Functioning/Environment              Frequency Min 2X/week     Progress Toward Goals  OT Goals(current goals can now be found in the care plan section)  Progress towards OT goals: Progressing toward goals  ADL Goals Pt Will Perform Grooming: with modified independence;standing Pt Will Perform  Lower Body Dressing: with supervision;sit to/from stand Pt Will Perform Tub/Shower Transfer: with modified independence;ambulating;Tub transfer;grab bars;rolling walker Additional ADL Goal #1: Pt will toilet with S on comfort height commode.  Plan Discharge plan remains appropriate    Co-evaluation                 End of Session Equipment Utilized During Treatment: Rolling walker   Activity Tolerance Patient limited by pain   Patient Left in chair;with call bell/phone within reach   Nurse Communication Mobility status;Patient requests pain meds        Time: 1542-1600 OT Time Calculation (min): 18 min  Charges: OT General Charges $OT Visit: 1 Procedure OT Treatments $Self Care/Home Management : 8-22 mins  Lachanda Buczek M 05/17/2015, 5:20 PM

## 2015-05-17 NOTE — Progress Notes (Signed)
1 Day Post-Op Procedure(s) (LRB): ESOPHAGOGASTRODUODENOSCOPY (EGD) (N/A) Subjective: No complaints this AM Wants to go home No BM yesterday   Objective: Vital signs in last 24 hours: Temp:  [97.7 F (36.5 C)-98.5 F (36.9 C)] 97.9 F (36.6 C) (05/19 0347) Pulse Rate:  [81-110] 91 (05/19 0729) Cardiac Rhythm:  [-] Normal sinus rhythm (05/19 0729) Resp:  [17-31] 26 (05/19 0729) BP: (105-168)/(57-112) 134/78 mmHg (05/19 0729) SpO2:  [98 %-100 %] 99 % (05/19 0729)  Hemodynamic parameters for last 24 hours:    Intake/Output from previous day: 05/18 0701 - 05/19 0700 In: 2325 [P.O.:300; I.V.:2025] Out: 1425 [Urine:1425] Intake/Output this shift:    General appearance: alert and no distress Neurologic: intact Heart: regular rate and rhythm Lungs: diminished breath sounds left base Abdomen: normal findings: soft, non-tender Wound: clean and dry 3+ edema B LE Lab Results:  Recent Labs  05/16/15 0450 05/16/15 1655 05/17/15 0450  WBC 11.9*  --  8.7  HGB 8.5* 8.7* 7.9*  HCT 24.3* 25.7* 22.7*  PLT 230  --  277   BMET:  Recent Labs  05/15/15 0530  NA 129*  K 3.4*  CL 96*  CO2 26  GLUCOSE 98  BUN 30*  CREATININE 0.72  CALCIUM 8.0*    PT/INR: No results for input(s): LABPROT, INR in the last 72 hours. ABG    Component Value Date/Time   PHART 7.393 05/10/2015 0505   HCO3 24.2* 05/10/2015 0505   TCO2 25.5 05/10/2015 0505   ACIDBASEDEF 0.0 05/10/2015 0505   O2SAT 95.4 05/10/2015 0505   CBG (last 3)  No results for input(s): GLUCAP in the last 72 hours.  Assessment/Plan: S/P Procedure(s) (LRB): ESOPHAGOGASTRODUODENOSCOPY (EGD) (N/A) -  POD # 7 left upper lobectomy Chest x-ray Ok No respiratory issues Large duodenal ulcer and multiple small ulcers seen on EGD Hgb went from 8.7 to 7.9, no obvious bleeding at present On clears- defer to GI re diet   LOS: 8 days    [name] 05/17/2015

## 2015-05-17 NOTE — Progress Notes (Signed)
Physical Therapy Treatment Patient Details Name: Tiffany [name] MRN: [id number] DOB: 12-12-1946 Today's Date: 05/17/2015    History of Present Illness Pt is a 69 y/o female presenting for surgery due to lung nodule, Status post left video-assisted thoracoscopy, wedge resection left upper.Marland Kitchen  PMHx; mild Charcot foot, B knee replacements, L hip pain, spinal fusion.    PT Comments    Pt limited today due to painful hip.  Unable to mobilize far, but pt not nearly as dyspneic as last session.   Follow Up Recommendations  Home health PT;Supervision for mobility/OOB     Equipment Recommendations  None recommended by PT    Recommendations for Other Services       Precautions / Restrictions Precautions Precautions: Fall    Mobility  Bed Mobility                  Transfers Overall transfer level: Needs assistance Equipment used: Rolling walker (2 wheeled) Transfers: Sit to/from Stand Sit to Stand: Min guard            Ambulation/Gait Ambulation/Gait assistance: Min guard Ambulation Distance (Feet): 50 Feet Assistive device: Rolling walker (2 wheeled) Gait Pattern/deviations: Antalgic Gait velocity: slower   General Gait Details: antalgic gait, minimal dyspnea.  Painful hip limiting.   Stairs            Wheelchair Mobility    Modified Rankin (Stroke Patients Only)       Balance Overall balance assessment: Needs assistance   Sitting balance-Leahy Scale: Good       Standing balance-Leahy Scale: Fair                      Cognition Arousal/Alertness: Awake/alert Behavior During Therapy: WFL for tasks assessed/performed Overall Cognitive Status: Within Functional Limits for tasks assessed                      Exercises      General Comments General comments (skin integrity, edema, etc.): vitals remained stable on RA      Pertinent Vitals/Pain Pain Assessment: Faces Faces Pain Scale: Hurts whole lot Pain Location: L  hip Pain Descriptors / Indicators: Jabbing;Grimacing;Stabbing Pain Intervention(s): Monitored during session;Repositioned;Patient requesting pain meds-RN notified    Home Living                      Prior Function            PT Goals (current goals can now be found in the care plan section) Acute Rehab PT Goals PT Goal Formulation: With patient Time For Goal Achievement: 05/18/15 Potential to Achieve Goals: Good Progress towards PT goals: Progressing toward goals    Frequency  Min 3X/week    PT Plan Current plan remains appropriate    Co-evaluation             End of Session   Activity Tolerance: Patient tolerated treatment well;Patient limited by pain Patient left: in chair;with call bell/phone within reach     Time: 1430-1450 PT Time Calculation (min) (ACUTE ONLY): 20 min  Charges:  $Gait Training: 8-22 mins                    G Codes:      Letita Prentiss, [name] 05/17/2015, 3:07 PM  05/17/2015  Donnella Sham, PT [phone]  (pager)

## 2015-05-17 NOTE — Progress Notes (Signed)
Daily Rounding Note  05/17/2015, 8:36 AM  LOS: 8 days   SUBJECTIVE:       Passing gas but no bowel movements yesterday or today. No nausea. No abdominal pain. Still pretty weak and needs help getting up from the commode to her bed.  Her husband is asking when I think she might go home.  Tolerating clears.   OBJECTIVE:         Vital signs in last 24 hours:    Temp:  [97.7 F (36.5 C)-98.5 F (36.9 C)] 97.9 F (36.6 C) (05/19 0347) Pulse Rate:  [81-110] 91 (05/19 0729) Resp:  [17-31] 26 (05/19 0729) BP: (105-168)/(57-112) 134/78 mmHg (05/19 0729) SpO2:  [98 %-100 %] 99 % (05/19 0729) Last BM Date: 05/14/15 Filed Weights   05/09/15 1145 05/09/15 2045  Weight: 200 lb (90.719 kg) 203 lb 11.3 oz (92.4 kg)   General: Weak, tired, comfortable. Looks ill.   Heart: RRR. Chest: Clear bilaterally. No dyspnea at rest. Coughs. Abdomen: Soft, NT, ND.  Active BS  Extremities: no CCE Neuro/Psych:  Pleasant, alert, cooperative.  Oriented x 3.    Intake/Output from previous day: 05/18 0701 - 05/19 0700 In: 2325 [P.O.:300; I.V.:2025] Out: 1425 [Urine:1425]  Intake/Output this shift:    Lab Results:  Recent Labs  05/15/15 0750  05/16/15 0450 05/16/15 1655 05/17/15 0450  WBC 15.1*  --  11.9*  --  8.7  HGB 6.7*  < > 8.5* 8.7* 7.9*  HCT 19.3*  < > 24.3* 25.7* 22.7*  PLT 233  --  230  --  277  < > = values in this interval not displayed. BMET  Recent Labs  05/15/15 0530  NA 129*  K 3.4*  CL 96*  CO2 26  GLUCOSE 98  BUN 30*  CREATININE 0.72  CALCIUM 8.0*    Studies/Results: Dg Chest 2 View  05/17/2015   CLINICAL DATA:  Left lung lobectomy.  EXAM: CHEST  2 VIEW  COMPARISON:  05/15/2015.  05/12/2015.  05/11/2015 .  FINDINGS: Right IJ line in stable position. Mediastinum hilar structures are stable. Prior left upper lobectomy. Left side pleural thickening is again noted, this is most likely postsurgical. Mild  infiltrate in the right upper lobe. Heart size normal.  IMPRESSION: 1. Right IJ line in stable position. 2. Mild infiltrate right upper lobe suggesting pneumonia. 3. Status post left upper lobectomy. Stable postsurgical changes left chest. No pneumothorax.   Electronically Signed   By: Marcello Moores  Register   On: 05/17/2015 08:10   Scheduled Meds: . antiseptic oral rinse  7 mL Mouth Rinse BID  . atenolol  50 mg Oral Daily  . buPROPion  150 mg Oral BID  . furosemide  40 mg Oral Daily  . pantoprazole (PROTONIX) IV  40 mg Intravenous Q12H  . potassium chloride  20 mEq Oral Daily  . pravastatin  40 mg Oral q1800   Continuous Infusions: . dextrose 5 % and 0.45 % NaCl with KCl 20 mEq/L 75 mL/hr at 05/17/15 0723   PRN Meds:.albuterol, morphine injection, ondansetron (ZOFRAN) IV, potassium chloride, prochlorperazine, traMADol  ASSESMENT:   *  UGIB in pt taking po Voltaren and no PPI.   EGD 5/18: ulcerative esophagitis, small prepyloric gastric ulcers, multiple duodenal ulcers (one "giant").  Clotest pending.   *  S/p LUL lobectomy.   *  ABL anemia.  Hgb up post 2 PRBCs 5/17 but dropped < 1 gram from post transfusion apex.  PLAN   *  Voltaren discontinued and PPI added 5/17  *  If clo test is + then treat with PPI/abx regimen. Continue twice daily PPI as outlined by Dr. Henrene Pastor on endoscopy report.  *  CBC in morning.  *  Hear healthy diet.   Tiffany [name]  05/17/2015, 8:36 AM Pager: [phone]  GI ATTENDING  Interval history and data reviewed. Patient seen and examined. Agree with interval progress note. No obvious GI bleeding. Endoscopic findings as outlined yesterday. As well as impressions and recommendations as outlined yesterday. Discussed with husband and patient. Please contact us if further help is needed. Will sign off. Thank you  Docia Chuck. Geri Seminole., M.D. [facility] Division of Gastroenterology

## 2015-05-18 LAB — CBC
HCT: 25.4 % — ABNORMAL LOW (ref 36.0–46.0)
Hemoglobin: 8.3 g/dL — ABNORMAL LOW (ref 12.0–15.0)
MCH: 30 pg (ref 26.0–34.0)
MCHC: 32.7 g/dL (ref 30.0–36.0)
MCV: 91.7 fL (ref 78.0–100.0)
Platelets: 347 10*3/uL (ref 150–400)
RBC: 2.77 MIL/uL — ABNORMAL LOW (ref 3.87–5.11)
RDW: 15.6 % — ABNORMAL HIGH (ref 11.5–15.5)
WBC: 8.8 10*3/uL (ref 4.0–10.5)

## 2015-05-18 MED ORDER — HYDROCODONE-ACETAMINOPHEN 5-325 MG PO TABS: 1.0000 | ORAL_TABLET | Freq: Four times a day (QID) | ORAL | Status: DC | PRN

## 2015-05-18 MED ORDER — POTASSIUM CHLORIDE CRYS ER 20 MEQ PO TBCR: 20.0000 meq | EXTENDED_RELEASE_TABLET | Freq: Every day | ORAL | Status: DC

## 2015-05-18 MED ORDER — PANTOPRAZOLE SODIUM 40 MG PO TBEC: 40.0000 mg | DELAYED_RELEASE_TABLET | Freq: Two times a day (BID) | ORAL | Status: DC

## 2015-05-18 MED ORDER — NYSTATIN 100000 UNIT/ML MT SUSP: 5.0000 mL | Freq: Four times a day (QID) | OROMUCOSAL | Status: DC

## 2015-05-18 NOTE — Progress Notes (Signed)
PT Cancellation Note  Patient Details Name: Tiffany [name] MRN: [id number] DOB: 19-Jul-1946   Cancelled Treatment:    Reason Eval/Treat Not Completed: Patient declined, no reason specified.  Patient reports she had been up with nursing in hallway.  Is waiting for MD to know if she is going home today.  Patient declined PT at this time.  Will return this pm for PT session as able.   Despina Pole 05/18/2015, 3:50 PM Carita Pian. Sanjuana Kava, [place] Pager [phone]

## 2015-05-18 NOTE — Progress Notes (Signed)
Went over discharge summary with patient and husband and they verbalized understanding. Telemetry  was removed and CCMD was notified.

## 2015-05-18 NOTE — Progress Notes (Signed)
Pt c/o pain in leg. Offered pain med but pt says she took prescribed pain pill from home brought by husband. Educated pt on importance of not taking outside medications. No pain med given at this time. Will continue to monitor.

## 2015-05-18 NOTE — Progress Notes (Addendum)
      [address]       [place] [address]             [phone]       2 Days Post-Op Procedure(s) (LRB): ESOPHAGOGASTRODUODENOSCOPY (EGD) (N/A)  Subjective: She really would like to go home.  Objective: Vital signs in last 24 hours: Temp:  [97.6 F (36.4 C)-98.5 F (36.9 C)] 98.1 F (36.7 C) (05/20 0341) Pulse Rate:  [72-79] 72 (05/20 0718) Cardiac Rhythm:  [-] Normal sinus rhythm (05/20 0844) Resp:  [17-25] 24 (05/20 0718) BP: (113-136)/(65-90) 124/70 mmHg (05/20 0718) SpO2:  [93 %-100 %] 97 % (05/20 0718)     Intake/Output from previous day: 05/19 0701 - 05/20 0700 In: 1325.6 [P.O.:900; I.V.:425.6] Out: 1875 [Urine:1875]   Physical Exam:  Cardiovascular: RRR Pulmonary: Clear to auscultation bilaterally; no rales, wheezes, or rhonchi. Abdomen: Soft, non tender, bowel sounds present. Extremities: Bilateral lower extremity edema. Wounds: Clean and dry.  No erythema or signs of infection.   Lab Results: CBC: Recent Labs  05/17/15 0450 05/18/15 0248  WBC 8.7 8.8  HGB 7.9* 8.3*  HCT 22.7* 25.4*  PLT 277 347   BMET: No results for input(s): NA, K, CL, CO2, GLUCOSE, BUN, CREATININE, CALCIUM in the last 72 hours.  PT/INR: No results for input(s): LABPROT, INR in the last 72 hours. ABG:  INR: Will add last result for INR, ABG once components are confirmed Will add last 4 CBG results once components are confirmed  Assessment/Plan:  1. CV - SR. On Atenolol 50 mg daily. Will resume Enalapril at discharge. 2.  Pulmonary - Encourage incentive spirometer. 3. GI-as seen on endoscopy, large duodenal ulcer and multiple small ulcers. Will continue on bid Protonix. She is tolerating a diet. 4. Anemia-H and H stable at 8.3 and 25.4. Etiology as above. 5. Likely discharge, but will discuss with Dr. Burman Blacksmith MPA-C 05/18/2015,9:53 AM Patient seen and examined, agree with above Tolerating regular diet Hemoglobin is  stable  Dc home today Will refer to [facility] as outpatient  Remo Lipps C. Roxan Hockey, MD [facility] [phone]

## 2015-05-20 ENCOUNTER — Other Ambulatory Visit: Payer: Self-pay | Admitting: Physician Assistant

## 2015-05-21 ENCOUNTER — Telehealth: Payer: Self-pay | Admitting: Internal Medicine

## 2015-05-21 NOTE — Telephone Encounter (Signed)
Patient's spouse called for referral request for urgent appointment for left hip pain eval. , Schd with Dr Zollie Beckers at [place] 05-22-15 at 9:10am

## 2015-05-22 DIAGNOSIS — M25552 Pain in left hip: Secondary | ICD-10-CM | POA: Diagnosis not present

## 2015-05-22 DIAGNOSIS — M1612 Unilateral primary osteoarthritis, left hip: Secondary | ICD-10-CM | POA: Diagnosis not present

## 2015-05-23 DIAGNOSIS — M1612 Unilateral primary osteoarthritis, left hip: Secondary | ICD-10-CM | POA: Diagnosis not present

## 2015-05-23 DIAGNOSIS — M25552 Pain in left hip: Secondary | ICD-10-CM | POA: Diagnosis not present

## 2015-05-31 LAB — AFB CULTURE WITH SMEAR (NOT AT ARMC): Acid Fast Smear: NONE SEEN

## 2015-06-01 ENCOUNTER — Other Ambulatory Visit: Payer: Self-pay | Admitting: Thoracic Surgery (Cardiothoracic Vascular Surgery)

## 2015-06-01 ENCOUNTER — Ambulatory Visit (INDEPENDENT_AMBULATORY_CARE_PROVIDER_SITE_OTHER): Payer: Self-pay | Admitting: *Deleted

## 2015-06-01 DIAGNOSIS — C3412 Malignant neoplasm of upper lobe, left bronchus or lung: Secondary | ICD-10-CM

## 2015-06-01 DIAGNOSIS — Z902 Acquired absence of lung [part of]: Secondary | ICD-10-CM

## 2015-06-01 DIAGNOSIS — C3490 Malignant neoplasm of unspecified part of unspecified bronchus or lung: Secondary | ICD-10-CM

## 2015-06-01 NOTE — Progress Notes (Signed)
Mrs. Coval returns for suture removal of two previous chest tube sites.  These sites as well as the mini VATS thoracotomy site are all well healed.  The sutures were easily removed. She relates no problems after her surgery. Her main issue is with her left hip and she is anxious to proceed with her surgery due to the pain and her difficulty walking without pain  Dr. Roxan Hockey has been contacted by Dr. Ninfa Linden regarding this issue.  She will return as scheduled with a chest xray.

## 2015-06-04 ENCOUNTER — Encounter: Payer: Self-pay | Admitting: Physician Assistant

## 2015-06-04 ENCOUNTER — Ambulatory Visit (INDEPENDENT_AMBULATORY_CARE_PROVIDER_SITE_OTHER): Payer: Self-pay | Admitting: Physician Assistant

## 2015-06-04 ENCOUNTER — Ambulatory Visit
Admission: RE | Admit: 2015-06-04 | Discharge: 2015-06-04 | Disposition: A | Discharge status: Home / Self Care | Payer: Medicare Other | Source: Ambulatory Visit | Attending: Thoracic Surgery (Cardiothoracic Vascular Surgery) | Admitting: Thoracic Surgery (Cardiothoracic Vascular Surgery)

## 2015-06-04 ENCOUNTER — Other Ambulatory Visit: Payer: Self-pay | Admitting: Thoracic Surgery (Cardiothoracic Vascular Surgery)

## 2015-06-04 VITALS — BP 118/77 | HR 75 | Resp 20 | Ht 67.0 in | Wt 204.0 lb

## 2015-06-04 DIAGNOSIS — C3490 Malignant neoplasm of unspecified part of unspecified bronchus or lung: Secondary | ICD-10-CM

## 2015-06-04 DIAGNOSIS — C3412 Malignant neoplasm of upper lobe, left bronchus or lung: Secondary | ICD-10-CM

## 2015-06-04 DIAGNOSIS — Z902 Acquired absence of lung [part of]: Secondary | ICD-10-CM

## 2015-06-04 DIAGNOSIS — Z9889 Other specified postprocedural states: Secondary | ICD-10-CM

## 2015-06-04 NOTE — Progress Notes (Signed)
HPI:  Patient returns for routine postoperative follow-up having undergone a left VATS, LUL, cryo analgesia of intercostal nerves III through VII by Dr. Roxan Hockey on 05/09/2015. Pathology showed adenocarcinoma. The patient's early postoperative recovery while in the hospital was notable for anemia. She was given a transufsion and GI (Dr. Henrene Pastor) was consulted. She undrewent an EGD with control of bleeding and biopsy on 05/16/2015. She was found to have ulcerative esophagitis and small pre pyloric ulcers, and multiple duodenal ulcers (obne was giant and required hemostatic therapy). She was placed on Protonix 40 mg bid. Since hospital discharge the patient reports she has continue left hip pain, which she had prior to undergoing lung surgery. She also states she has had no nausea, vomiting, or blood in stool.   Current Outpatient Prescriptions  Medication Sig Dispense Refill  . atenolol (TENORMIN) 100 MG tablet TAKE 1 TABLET BY MOUTH DAILY FOR BLOOD PRESSURE (Patient taking differently: TAKE 1/2 TABLET BY MOUTH DAILY FOR BLOOD PRESSURE) 90 tablet 3  . buPROPion (WELLBUTRIN SR) 150 MG 12 hr tablet TAKE 1 TABLET BY MOUTH TWICE DAILY 60 tablet 2  . Cholecalciferol (VITAMIN D PO) Take 5,000 Units by mouth 2 (two) times daily.     . diphenhydrAMINE (BENADRYL) 25 MG tablet Take 25 mg by mouth every 8 (eight) hours as needed for allergies.    Marland Kitchen enalapril (VASOTEC) 20 MG tablet TAKE 1 TABLET BY MOUTH DAILY FOR HIGH BLOOD PRESSURE 90 tablet 1  . furosemide (LASIX) 40 MG tablet TAKE 1 TABLET BY MOUTH EVERY MORNING 90 tablet 0  . HYDROcodone-acetaminophen (NORCO/VICODIN) 5-325 MG per tablet Take 1 tablet by mouth every 6 (six) hours as needed for severe pain. 30 tablet 0  . Multiple Vitamins-Minerals (MULTIVITAMIN PO) Take by mouth.    . nystatin (MYCOSTATIN) 100000 UNIT/ML suspension Take 5 mLs (500,000 Units total) by mouth 4 (four) times daily. 60 mL 0  . pantoprazole (PROTONIX) 40 MG tablet Take 1  tablet (40 mg total) by mouth 2 (two) times daily. 60 tablet 1  . potassium chloride SA (K-DUR,KLOR-CON) 20 MEQ tablet Take 1 tablet (20 mEq total) by mouth daily. 30 tablet 1  . pravastatin (PRAVACHOL) 40 MG tablet TAKE 1 TABLET BY MOUTH EVERY NIGHT AT BEDTIME FOR CHOLESTEROL 90 tablet 1   No current facility-administered medications for this visit.  Vital Signs: BP 118/77, HR 75, RR 20  Physical Exam: CV-RRR Pulmonary-Clear Abdomen-Soft, non tender, bowel sounds present Wounds-Clean and dry. Well healed, no signs of infection  Diagnostic Tests: EXAM: CHEST 2 VIEW  COMPARISON: 05/17/2015  FINDINGS: Postoperative changes on the left old left rib fractures are noted. No confluent opacity on the left. Previously seen right upper lobe infiltrate has improved. Slight residual right upper lobe opacity. No effusions. No pneumothorax. Heart is normal size. No acute bony abnormality.  IMPRESSION: Postoperative changes on the left.  Improving right upper lobe infiltrate.   Electronically Signed  By: Rolm Baptise M.D.  On: 06/04/2015 14:38  Impression and Plan: Mrs. Band is recovering well from left lung surgery. She states she is remaining tobacco free.  I encouraged her to continue with this. Her biggest complaint is left hip pain. She relies on a wheelchair mostly as pain severely limits her mobility. She states she needs an operation by Dr. Rush Farmer, once cleared from our standpoint. I will discuss this along with whether there is any adjuvant treatment needed with Dr. Roxan Hockey. Finally, she has not yet made an appointment to see Dr. Henrene Pastor yet,  but states she will. Per patient request, she will return as needed unless instructed otherwise.   Nani Skillern, PA-C [facility] [phone]

## 2015-06-14 ENCOUNTER — Other Ambulatory Visit (HOSPITAL_COMMUNITY): Payer: Self-pay | Admitting: Orthopaedic Surgery

## 2015-07-04 ENCOUNTER — Other Ambulatory Visit (HOSPITAL_COMMUNITY): Payer: Self-pay | Admitting: Anesthesiology

## 2015-07-04 NOTE — Progress Notes (Signed)
04/18/15-noted EKG in EPIC. 06/04/2015-noted CXR 2 view in EPIC.

## 2015-07-04 NOTE — Patient Instructions (Addendum)
Tiffany [name]  07/04/2015   Your procedure is scheduled on: Friday 07/13/2015   Report to [facility] Main  Entrance take [place]  elevators to 3rd floor to  [name] at  [place] AM.  Call this number if you have problems the morning of surgery [phone]   Remember: ONLY 1 PERSON MAY GO WITH YOU TO SHORT STAY TO GET  READY MORNING OF [place].  Do not eat food or drink liquids :After Midnight.     Take these medicines the morning of surgery with A SIP OF WATER: Atenolol, Bupropion (wellbutrin)                               You may not have any metal on your body including hair pins and              piercings  Do not wear jewelry, make-up, lotions, powders or perfumes, deodorant             Do not wear nail polish.  Do not shave  48 hours prior to surgery.              Men may shave face and neck.   Do not bring valuables to the hospital. [place].  Contacts, dentures or bridgework may not be worn into surgery.  Leave suitcase in the car. After surgery it may be brought to your room.     _____________________________________________________________________             [facility] - Preparing for Surgery Before surgery, you can play an important role.  Because skin is not sterile, your skin needs to be as free of germs as possible.  You can reduce the number of germs on your skin by washing with CHG (chlorahexidine gluconate) soap before surgery.  CHG is an antiseptic cleaner which kills germs and bonds with the skin to continue killing germs even after washing. Please DO NOT use if you have an allergy to CHG or antibacterial soaps.  If your skin becomes reddened/irritated stop using the CHG and inform your nurse when you arrive at Short Stay. Do not shave (including legs and underarms) for at least 48 hours prior to the first CHG shower.  You may shave your face/neck. Please follow these instructions  carefully:  1.  Shower with CHG Soap the night before surgery and the  morning of Surgery.  2.  If you choose to wash your hair, wash your hair first as usual with your  normal  shampoo.  3.  After you shampoo, rinse your hair and body thoroughly to remove the  shampoo.                           4.  Use CHG as you would any other liquid soap.  You can apply chg directly  to the skin and wash                       Gently with a scrungie or clean washcloth.  5.  Apply the CHG Soap to your body ONLY FROM THE NECK DOWN.   Do not use on face/ open  Wound or open sores. Avoid contact with eyes, ears mouth and genitals (private parts).                       Wash face,  Genitals (private parts) with your normal soap.             6.  Wash thoroughly, paying special attention to the area where your surgery  will be performed.  7.  Thoroughly rinse your body with warm water from the neck down.  8.  DO NOT shower/wash with your normal soap after using and rinsing off  the CHG Soap.                9.  Pat yourself dry with a clean towel.            10.  Wear clean pajamas.            11.  Place clean sheets on your bed the night of your first shower and do not  sleep with pets. Day of Surgery : Do not apply any lotions/deodorants the morning of surgery.  Please wear clean clothes to the hospital/surgery center.  FAILURE TO FOLLOW THESE INSTRUCTIONS MAY RESULT IN THE CANCELLATION OF YOUR SURGERY PATIENT SIGNATURE_________________________________  NURSE SIGNATURE__________________________________  ________________________________________________________________________

## 2015-07-06 ENCOUNTER — Encounter (HOSPITAL_COMMUNITY)
Admission: RE | Admit: 2015-07-06 | Discharge: 2015-07-06 | Disposition: A | Discharge status: Home / Self Care | Payer: Medicare Other | Source: Ambulatory Visit | Attending: Orthopaedic Surgery | Admitting: Orthopaedic Surgery

## 2015-07-06 ENCOUNTER — Encounter (HOSPITAL_COMMUNITY): Payer: Self-pay

## 2015-07-06 DIAGNOSIS — Z01812 Encounter for preprocedural laboratory examination: Secondary | ICD-10-CM | POA: Insufficient documentation

## 2015-07-06 DIAGNOSIS — M1612 Unilateral primary osteoarthritis, left hip: Secondary | ICD-10-CM | POA: Insufficient documentation

## 2015-07-06 HISTORY — DX: Personal history of other medical treatment: Z92.89

## 2015-07-06 HISTORY — DX: Anemia, unspecified: D64.9

## 2015-07-06 LAB — BASIC METABOLIC PANEL
Anion gap: 10 (ref 5–15)
BUN: 13 mg/dL (ref 6–20)
CHLORIDE: 99 mmol/L — AB (ref 101–111)
CO2: 27 mmol/L (ref 22–32)
CREATININE: 0.96 mg/dL (ref 0.44–1.00)
Calcium: 9.6 mg/dL (ref 8.9–10.3)
GFR calc non Af Amer: >60 mL/min (ref >60)
GLUCOSE: 95 mg/dL (ref 65–99)
Potassium: 4.8 mmol/L (ref 3.5–5.1)
Sodium: 136 mmol/L (ref 135–145)

## 2015-07-06 LAB — CBC
HEMATOCRIT: 38 % (ref 36.0–46.0)
Hemoglobin: 12 g/dL (ref 12.0–15.0)
MCH: 28.9 pg (ref 26.0–34.0)
MCHC: 31.6 g/dL (ref 30.0–36.0)
MCV: 91.6 fL (ref 78.0–100.0)
Platelets: 363 10*3/uL (ref 150–400)
RBC: 4.15 MIL/uL (ref 3.87–5.11)
RDW: 14.3 % (ref 11.5–15.5)
WBC: 11.4 10*3/uL — AB (ref 4.0–10.5)

## 2015-07-06 LAB — SURGICAL PCR SCREEN
MRSA, PCR: NEGATIVE
STAPHYLOCOCCUS AUREUS: NEGATIVE

## 2015-07-06 LAB — PROTIME-INR
INR: 0.97 (ref 0.00–1.49)
Prothrombin Time: 13.1 seconds (ref 11.6–15.2)

## 2015-07-06 LAB — APTT: APTT: 29 s (ref 24–37)

## 2015-07-06 NOTE — Progress Notes (Signed)
Spoke with dr Delma Post and and made  aware 06-04-15 chest xray 2 view results chest xray not needed to be repeated for 7-15 16 surgery.

## 2015-07-06 NOTE — Progress Notes (Signed)
Note faxed from dr blackman office dr Ninfa Linden spoke with dr hendrickson cardiothoracic surgeon states per note late June or July would be ok for hip replacement surgery.

## 2015-07-06 NOTE — Progress Notes (Signed)
[facility] cardiothoracic 06-04-15 epic

## 2015-07-12 NOTE — Anesthesia Preprocedure Evaluation (Addendum)
Anesthesia Evaluation  Patient identified by MRN, date of birth, ID band Patient awake    Reviewed: Allergy & Precautions, H&P , NPO status , Patient's Chart, lab work & pertinent test results, reviewed documented beta blocker date and time   Airway Mallampati: II  TM Distance: >3 FB Neck ROM: full    Dental  (+) Dental Advisory Given, Loose, Caps Very loose right upper front tooth.  Patient advised that this tooth will probably come out during or after this surgery.  All upper front are capped.:   Pulmonary Current Smoker, former smoker,  LUL nodule. Lung cancer s/p VATS x 2 breath sounds clear to auscultation  Pulmonary exam normal       Cardiovascular Exercise Tolerance: Good hypertension, Pt. on home beta blockers and Pt. on medications Normal cardiovascular examRhythm:regular Rate:Normal  Respiratory status is OK according to patient   Neuro/Psych negative neurological ROS  negative psych ROS   GI/Hepatic negative GI ROS, Neg liver ROS, PUD, GERD-  Medicated and Controlled,  Endo/Other  negative endocrine ROSHyperthyroidism Obese. Pre - diabetes  Renal/GU negative Renal ROS  negative genitourinary   Musculoskeletal  (+) Arthritis -,   Abdominal   Peds  Hematology negative hematology ROS (+)   Anesthesia Other Findings   Reproductive/Obstetrics negative OB ROS                            Anesthesia Physical Anesthesia Plan  ASA: III  Anesthesia Plan: Spinal   Post-op Pain Management:    Induction:   Airway Management Planned: Simple Face Mask  Additional Equipment:   Intra-op Plan:   Post-operative Plan:   Informed Consent: I have reviewed the patients History and Physical, chart, labs and discussed the procedure including the risks, benefits and alternatives for the proposed anesthesia with the patient or authorized representative who has indicated his/her understanding and  acceptance.   Dental Advisory Given  Plan Discussed with: CRNA and Surgeon  Anesthesia Plan Comments:        Anesthesia Quick Evaluation

## 2015-07-13 ENCOUNTER — Encounter (HOSPITAL_COMMUNITY)
Admission: RE | Disposition: A | Discharge status: Home Health | Payer: Self-pay | Source: Ambulatory Visit | Attending: Orthopaedic Surgery

## 2015-07-13 ENCOUNTER — Inpatient Hospital Stay (HOSPITAL_COMMUNITY)
Admission: RE | Admit: 2015-07-13 | Discharge: 2015-07-16 | DRG: 470 | Disposition: A | Discharge status: Home Health | Payer: Medicare Other | Source: Ambulatory Visit | Attending: Orthopaedic Surgery | Admitting: Orthopaedic Surgery

## 2015-07-13 ENCOUNTER — Inpatient Hospital Stay (HOSPITAL_COMMUNITY): Payer: Medicare Other | Admitting: Anesthesiology

## 2015-07-13 ENCOUNTER — Inpatient Hospital Stay (HOSPITAL_COMMUNITY): Payer: Medicare Other

## 2015-07-13 ENCOUNTER — Encounter (HOSPITAL_COMMUNITY): Payer: Self-pay | Admitting: *Deleted

## 2015-07-13 DIAGNOSIS — Z96642 Presence of left artificial hip joint: Secondary | ICD-10-CM

## 2015-07-13 DIAGNOSIS — Z981 Arthrodesis status: Secondary | ICD-10-CM | POA: Diagnosis not present

## 2015-07-13 DIAGNOSIS — M1612 Unilateral primary osteoarthritis, left hip: Secondary | ICD-10-CM | POA: Diagnosis not present

## 2015-07-13 DIAGNOSIS — E669 Obesity, unspecified: Secondary | ICD-10-CM | POA: Diagnosis present

## 2015-07-13 DIAGNOSIS — M8580 Other specified disorders of bone density and structure, unspecified site: Secondary | ICD-10-CM | POA: Diagnosis present

## 2015-07-13 DIAGNOSIS — I10 Essential (primary) hypertension: Secondary | ICD-10-CM | POA: Diagnosis not present

## 2015-07-13 DIAGNOSIS — Z01812 Encounter for preprocedural laboratory examination: Secondary | ICD-10-CM | POA: Diagnosis not present

## 2015-07-13 DIAGNOSIS — E559 Vitamin D deficiency, unspecified: Secondary | ICD-10-CM | POA: Diagnosis not present

## 2015-07-13 DIAGNOSIS — Z6830 Body mass index (BMI) 30.0-30.9, adult: Secondary | ICD-10-CM

## 2015-07-13 DIAGNOSIS — Z87891 Personal history of nicotine dependence: Secondary | ICD-10-CM | POA: Diagnosis not present

## 2015-07-13 DIAGNOSIS — Z803 Family history of malignant neoplasm of breast: Secondary | ICD-10-CM

## 2015-07-13 DIAGNOSIS — Z85118 Personal history of other malignant neoplasm of bronchus and lung: Secondary | ICD-10-CM | POA: Diagnosis not present

## 2015-07-13 DIAGNOSIS — Z8249 Family history of ischemic heart disease and other diseases of the circulatory system: Secondary | ICD-10-CM

## 2015-07-13 DIAGNOSIS — E0590 Thyrotoxicosis, unspecified without thyrotoxic crisis or storm: Secondary | ICD-10-CM | POA: Diagnosis present

## 2015-07-13 DIAGNOSIS — Z8711 Personal history of peptic ulcer disease: Secondary | ICD-10-CM | POA: Diagnosis not present

## 2015-07-13 DIAGNOSIS — Z888 Allergy status to other drugs, medicaments and biological substances status: Secondary | ICD-10-CM | POA: Diagnosis not present

## 2015-07-13 DIAGNOSIS — Z800 Family history of malignant neoplasm of digestive organs: Secondary | ICD-10-CM

## 2015-07-13 DIAGNOSIS — Z79899 Other long term (current) drug therapy: Secondary | ICD-10-CM | POA: Diagnosis not present

## 2015-07-13 DIAGNOSIS — Z79891 Long term (current) use of opiate analgesic: Secondary | ICD-10-CM

## 2015-07-13 DIAGNOSIS — E785 Hyperlipidemia, unspecified: Secondary | ICD-10-CM | POA: Diagnosis present

## 2015-07-13 DIAGNOSIS — M169 Osteoarthritis of hip, unspecified: Secondary | ICD-10-CM | POA: Diagnosis not present

## 2015-07-13 DIAGNOSIS — K219 Gastro-esophageal reflux disease without esophagitis: Secondary | ICD-10-CM | POA: Diagnosis present

## 2015-07-13 DIAGNOSIS — Z471 Aftercare following joint replacement surgery: Secondary | ICD-10-CM | POA: Diagnosis not present

## 2015-07-13 DIAGNOSIS — Z419 Encounter for procedure for purposes other than remedying health state, unspecified: Secondary | ICD-10-CM

## 2015-07-13 DIAGNOSIS — M25452 Effusion, left hip: Secondary | ICD-10-CM | POA: Diagnosis present

## 2015-07-13 DIAGNOSIS — D62 Acute posthemorrhagic anemia: Secondary | ICD-10-CM | POA: Diagnosis not present

## 2015-07-13 DIAGNOSIS — M25552 Pain in left hip: Secondary | ICD-10-CM | POA: Diagnosis not present

## 2015-07-13 HISTORY — DX: Presence of left artificial hip joint: Z96.642

## 2015-07-13 HISTORY — PX: TOTAL HIP ARTHROPLASTY: SHX124

## 2015-07-13 LAB — TYPE AND SCREEN
ABO/RH(D): O POS
Antibody Screen: NEGATIVE

## 2015-07-13 LAB — ABO/RH: ABO/RH(D): O POS

## 2015-07-13 SURGERY — ARTHROPLASTY, HIP, TOTAL, ANTERIOR APPROACH
Anesthesia: Spinal | Laterality: Left

## 2015-07-13 MED ORDER — MENTHOL 3 MG MT LOZG: 1.0000 | LOZENGE | OROMUCOSAL | Status: DC | PRN

## 2015-07-13 MED ORDER — ACETAMINOPHEN 650 MG RE SUPP: 650.0000 mg | Freq: Four times a day (QID) | RECTAL | Status: DC | PRN

## 2015-07-13 MED ORDER — METHOCARBAMOL 500 MG PO TABS
500.0000 mg | ORAL_TABLET | Freq: Four times a day (QID) | ORAL | Status: DC | PRN
  Administered 2015-07-14 – 2015-07-15 (×4): 500 mg via ORAL
  Filled 2015-07-13 (×6): qty 1

## 2015-07-13 MED ORDER — MIDAZOLAM HCL 5 MG/5ML IJ SOLN
INTRAMUSCULAR | Status: DC | PRN
Start: 2015-07-13 — End: 2015-07-13
  Administered 2015-07-13 (×2): 1 mg via INTRAVENOUS

## 2015-07-13 MED ORDER — EPHEDRINE SULFATE 50 MG/ML IJ SOLN
INTRAMUSCULAR | Status: DC | PRN
  Administered 2015-07-13: 15 mg via INTRAVENOUS
  Administered 2015-07-13 (×2): 10 mg via INTRAVENOUS

## 2015-07-13 MED ORDER — MEPERIDINE HCL 50 MG/ML IJ SOLN
INTRAMUSCULAR | Status: AC
  Filled 2015-07-13: qty 1

## 2015-07-13 MED ORDER — LIDOCAINE HCL (CARDIAC) 20 MG/ML IV SOLN
INTRAVENOUS | Status: DC | PRN
  Administered 2015-07-13: 50 mg via INTRAVENOUS

## 2015-07-13 MED ORDER — BUPIVACAINE HCL (PF) 0.5 % IJ SOLN
INTRAMUSCULAR | Status: AC
  Filled 2015-07-13: qty 30

## 2015-07-13 MED ORDER — FENTANYL CITRATE (PF) 100 MCG/2ML IJ SOLN
INTRAMUSCULAR | Status: DC | PRN
  Administered 2015-07-13 (×2): 50 ug via INTRAVENOUS

## 2015-07-13 MED ORDER — ASPIRIN EC 325 MG PO TBEC
325.0000 mg | DELAYED_RELEASE_TABLET | Freq: Two times a day (BID) | ORAL | Status: DC
Start: 2015-07-14 — End: 2015-07-16
  Administered 2015-07-14 – 2015-07-16 (×5): 325 mg via ORAL
  Filled 2015-07-13 (×7): qty 1

## 2015-07-13 MED ORDER — DIPHENHYDRAMINE HCL 50 MG/ML IJ SOLN
INTRAMUSCULAR | Status: DC | PRN
  Administered 2015-07-13: 25 mg via INTRAVENOUS

## 2015-07-13 MED ORDER — CEFAZOLIN SODIUM 1-5 GM-% IV SOLN
1.0000 g | Freq: Four times a day (QID) | INTRAVENOUS | Status: AC
  Administered 2015-07-13 (×2): 1 g via INTRAVENOUS
  Filled 2015-07-13 (×2): qty 50

## 2015-07-13 MED ORDER — ACETAMINOPHEN 325 MG PO TABS: 650.0000 mg | ORAL_TABLET | Freq: Four times a day (QID) | ORAL | Status: DC | PRN

## 2015-07-13 MED ORDER — PRAVASTATIN SODIUM 40 MG PO TABS
40.0000 mg | ORAL_TABLET | Freq: Every day | ORAL | Status: DC
  Administered 2015-07-13 – 2015-07-16 (×4): 40 mg via ORAL
  Filled 2015-07-13 (×4): qty 1

## 2015-07-13 MED ORDER — SODIUM CHLORIDE 0.9 % IV SOLN
INTRAVENOUS | Status: DC
  Administered 2015-07-13 – 2015-07-14 (×2): via INTRAVENOUS

## 2015-07-13 MED ORDER — PROPOFOL 10 MG/ML IV BOLUS
INTRAVENOUS | Status: AC
  Filled 2015-07-13: qty 20

## 2015-07-13 MED ORDER — PROPOFOL INFUSION 10 MG/ML OPTIME
INTRAVENOUS | Status: DC | PRN
  Administered 2015-07-13: 50 ug/kg/min via INTRAVENOUS

## 2015-07-13 MED ORDER — KETOROLAC TROMETHAMINE 15 MG/ML IJ SOLN
7.5000 mg | Freq: Four times a day (QID) | INTRAMUSCULAR | Status: DC | PRN
  Administered 2015-07-14: 7.5 mg via INTRAVENOUS
  Filled 2015-07-13: qty 1

## 2015-07-13 MED ORDER — SODIUM CHLORIDE 0.9 % IR SOLN
Status: DC | PRN
  Administered 2015-07-13: 1000 mL

## 2015-07-13 MED ORDER — METOCLOPRAMIDE HCL 10 MG PO TABS: 5.0000 mg | ORAL_TABLET | Freq: Three times a day (TID) | ORAL | Status: DC | PRN

## 2015-07-13 MED ORDER — ZOLPIDEM TARTRATE 5 MG PO TABS: 5.0000 mg | ORAL_TABLET | Freq: Every evening | ORAL | Status: DC | PRN

## 2015-07-13 MED ORDER — CEFAZOLIN SODIUM-DEXTROSE 2-3 GM-% IV SOLR
INTRAVENOUS | Status: AC
  Filled 2015-07-13: qty 50

## 2015-07-13 MED ORDER — MEPERIDINE HCL 50 MG/ML IJ SOLN
6.2500 mg | INTRAMUSCULAR | Status: DC | PRN
  Administered 2015-07-13: 12.5 mg via INTRAVENOUS

## 2015-07-13 MED ORDER — ATENOLOL 100 MG PO TABS
100.0000 mg | ORAL_TABLET | Freq: Every day | ORAL | Status: DC
  Administered 2015-07-14 – 2015-07-16 (×3): 100 mg via ORAL
  Filled 2015-07-13 (×3): qty 1

## 2015-07-13 MED ORDER — ONDANSETRON HCL 4 MG PO TABS: 4.0000 mg | ORAL_TABLET | Freq: Four times a day (QID) | ORAL | Status: DC | PRN

## 2015-07-13 MED ORDER — METHOCARBAMOL 1000 MG/10ML IJ SOLN
500.0000 mg | Freq: Four times a day (QID) | INTRAVENOUS | Status: DC | PRN
  Administered 2015-07-13: 500 mg via INTRAVENOUS
  Filled 2015-07-13 (×2): qty 5

## 2015-07-13 MED ORDER — PHENYLEPHRINE HCL 10 MG/ML IJ SOLN
INTRAMUSCULAR | Status: DC | PRN
  Administered 2015-07-13 (×3): 100 ug via INTRAVENOUS
  Administered 2015-07-13 (×2): 80 ug via INTRAVENOUS
  Administered 2015-07-13: 100 ug via INTRAVENOUS
  Administered 2015-07-13 (×4): 80 ug via INTRAVENOUS
  Administered 2015-07-13: 120 ug via INTRAVENOUS
  Administered 2015-07-13: 80 ug via INTRAVENOUS

## 2015-07-13 MED ORDER — ONDANSETRON HCL 4 MG/2ML IJ SOLN
4.0000 mg | Freq: Four times a day (QID) | INTRAMUSCULAR | Status: DC | PRN
  Administered 2015-07-13: 4 mg via INTRAVENOUS
  Filled 2015-07-13: qty 2

## 2015-07-13 MED ORDER — ALUM & MAG HYDROXIDE-SIMETH 200-200-20 MG/5ML PO SUSP: 30.0000 mL | ORAL | Status: DC | PRN

## 2015-07-13 MED ORDER — CEFAZOLIN SODIUM-DEXTROSE 2-3 GM-% IV SOLR
2.0000 g | INTRAVENOUS | Status: AC
  Administered 2015-07-13: 2 g via INTRAVENOUS

## 2015-07-13 MED ORDER — LACTATED RINGERS IV SOLN: INTRAVENOUS | Status: DC

## 2015-07-13 MED ORDER — METOCLOPRAMIDE HCL 5 MG/ML IJ SOLN: 5.0000 mg | Freq: Three times a day (TID) | INTRAMUSCULAR | Status: DC | PRN

## 2015-07-13 MED ORDER — OXYCODONE HCL 5 MG PO TABS
5.0000 mg | ORAL_TABLET | ORAL | Status: DC | PRN
  Administered 2015-07-13: 10 mg via ORAL
  Administered 2015-07-13: 5 mg via ORAL
  Administered 2015-07-13 – 2015-07-15 (×6): 10 mg via ORAL
  Filled 2015-07-13: qty 1
  Filled 2015-07-13 (×7): qty 2

## 2015-07-13 MED ORDER — MIDAZOLAM HCL 2 MG/2ML IJ SOLN
INTRAMUSCULAR | Status: AC
  Filled 2015-07-13: qty 2

## 2015-07-13 MED ORDER — PHENYLEPHRINE HCL 10 MG/ML IJ SOLN
0.0000 ug/min | INTRAVENOUS | Status: DC
  Administered 2015-07-13: 10 ug/min via INTRAVENOUS
  Filled 2015-07-13: qty 1

## 2015-07-13 MED ORDER — HYDROMORPHONE HCL 1 MG/ML IJ SOLN: 0.2500 mg | INTRAMUSCULAR | Status: DC | PRN

## 2015-07-13 MED ORDER — PHENOL 1.4 % MT LIQD
1.0000 | OROMUCOSAL | Status: DC | PRN
  Filled 2015-07-13: qty 177

## 2015-07-13 MED ORDER — DIPHENHYDRAMINE HCL 12.5 MG/5ML PO ELIX
12.5000 mg | ORAL_SOLUTION | ORAL | Status: DC | PRN
  Administered 2015-07-13: 25 mg via ORAL
  Filled 2015-07-13: qty 10

## 2015-07-13 MED ORDER — FENTANYL CITRATE (PF) 100 MCG/2ML IJ SOLN
INTRAMUSCULAR | Status: AC
  Filled 2015-07-13: qty 2

## 2015-07-13 MED ORDER — FUROSEMIDE 40 MG PO TABS
40.0000 mg | ORAL_TABLET | Freq: Every morning | ORAL | Status: DC
  Administered 2015-07-14 – 2015-07-15 (×2): 40 mg via ORAL
  Filled 2015-07-13 (×4): qty 1

## 2015-07-13 MED ORDER — HYDROMORPHONE HCL 1 MG/ML IJ SOLN
1.0000 mg | INTRAMUSCULAR | Status: DC | PRN
  Administered 2015-07-13: 1 mg via INTRAVENOUS
  Filled 2015-07-13: qty 1

## 2015-07-13 MED ORDER — LACTATED RINGERS IV SOLN
INTRAVENOUS | Status: DC | PRN
  Administered 2015-07-13 (×2): via INTRAVENOUS

## 2015-07-13 MED ORDER — BUPIVACAINE HCL (PF) 0.5 % IJ SOLN
INTRAMUSCULAR | Status: DC | PRN
  Administered 2015-07-13: 15 mg via INTRATHECAL

## 2015-07-13 MED ORDER — BUPROPION HCL ER (SR) 150 MG PO TB12
150.0000 mg | ORAL_TABLET | Freq: Two times a day (BID) | ORAL | Status: DC
  Administered 2015-07-13 – 2015-07-16 (×6): 150 mg via ORAL
  Filled 2015-07-13 (×7): qty 1

## 2015-07-13 SURGICAL SUPPLY — 42 items
BAG ZIPLOCK 12X15 (MISCELLANEOUS) IMPLANT
BENZOIN TINCTURE PRP APPL 2/3 (GAUZE/BANDAGES/DRESSINGS) IMPLANT
BLADE SAW SGTL 18X1.27X75 (BLADE) ×2 IMPLANT
BLADE SAW SGTL 18X1.27X75MM (BLADE) ×1
CAPT HIP TOTAL 2 ×3 IMPLANT
CELLS DAT CNTRL 66122 CELL SVR (MISCELLANEOUS) ×1 IMPLANT
CLOSURE WOUND 1/2 X4 (GAUZE/BANDAGES/DRESSINGS)
COVER PERINEAL POST (MISCELLANEOUS) ×3 IMPLANT
DRAPE C-ARM 42X120 X-RAY (DRAPES) ×3 IMPLANT
DRAPE STERI IOBAN 125X83 (DRAPES) ×3 IMPLANT
DRAPE U-SHAPE 47X51 STRL (DRAPES) ×9 IMPLANT
DRSG AQUACEL AG ADV 3.5X10 (GAUZE/BANDAGES/DRESSINGS) ×3 IMPLANT
DURAPREP 26ML APPLICATOR (WOUND CARE) ×3 IMPLANT
ELECT BLADE TIP CTD 4 INCH (ELECTRODE) ×3 IMPLANT
ELECT REM PT RETURN 9FT ADLT (ELECTROSURGICAL) ×3
ELECTRODE REM PT RTRN 9FT ADLT (ELECTROSURGICAL) ×1 IMPLANT
ELIMINATOR HOLE APEX DEPUY (Hips) IMPLANT
FACESHIELD WRAPAROUND (MASK) ×12 IMPLANT
GAUZE XEROFORM 1X8 LF (GAUZE/BANDAGES/DRESSINGS) ×3 IMPLANT
GLOVE BIO SURGEON STRL SZ7.5 (GLOVE) ×3 IMPLANT
GLOVE BIOGEL PI IND STRL 8 (GLOVE) ×2 IMPLANT
GLOVE BIOGEL PI INDICATOR 8 (GLOVE) ×4
GLOVE ECLIPSE 8.0 STRL XLNG CF (GLOVE) ×3 IMPLANT
GOWN STRL REUS W/TWL XL LVL3 (GOWN DISPOSABLE) ×6 IMPLANT
HANDPIECE INTERPULSE COAX TIP (DISPOSABLE) ×2
KIT BASIN OR (CUSTOM PROCEDURE TRAY) ×3 IMPLANT
PACK TOTAL JOINT (CUSTOM PROCEDURE TRAY) ×3 IMPLANT
PEN SKIN MARKING BROAD (MISCELLANEOUS) ×3 IMPLANT
RTRCTR WOUND ALEXIS 18CM MED (MISCELLANEOUS) ×3
SET HNDPC FAN SPRY TIP SCT (DISPOSABLE) ×1 IMPLANT
STAPLER VISISTAT 35W (STAPLE) ×3 IMPLANT
STRIP CLOSURE SKIN 1/2X4 (GAUZE/BANDAGES/DRESSINGS) IMPLANT
SUT ETHIBOND NAB CT1 #1 30IN (SUTURE) ×3 IMPLANT
SUT MNCRL AB 4-0 PS2 18 (SUTURE) IMPLANT
SUT VIC AB 0 CT1 36 (SUTURE) ×3 IMPLANT
SUT VIC AB 1 CT1 36 (SUTURE) ×3 IMPLANT
SUT VIC AB 2-0 CT1 27 (SUTURE) ×4
SUT VIC AB 2-0 CT1 TAPERPNT 27 (SUTURE) ×2 IMPLANT
TOWEL OR 17X26 10 PK STRL BLUE (TOWEL DISPOSABLE) ×3 IMPLANT
TOWEL OR NON WOVEN STRL DISP B (DISPOSABLE) ×3 IMPLANT
TRAY FOLEY W/METER SILVER 14FR (SET/KITS/TRAYS/PACK) ×3 IMPLANT
YANKAUER SUCT BULB TIP 10FT TU (MISCELLANEOUS) ×3 IMPLANT

## 2015-07-13 NOTE — Anesthesia Procedure Notes (Signed)
Spinal Patient location during procedure: OR Start time: 07/13/2015 7:44 AM End time: 07/13/2015 7:51 AM Staffing Anesthesiologist: Rod Mae Performed by: anesthesiologist  Preanesthetic Checklist Completed: patient identified, site marked, surgical consent, pre-op evaluation, timeout performed, IV checked, risks and benefits discussed and monitors and equipment checked Spinal Block Patient position: sitting Prep: Betadine and site prepped and draped Patient monitoring: heart rate, continuous pulse ox and blood pressure Approach: midline Location: L3-4 Injection technique: single-shot Needle Needle type: Quincke  Needle gauge: 25 G Needle length: 9 cm Needle insertion depth: 8 cm Additional Notes Pt sitting position x1 attempt CRNA unsuccessful X1 attempt MDA easily placed without parasthesia

## 2015-07-13 NOTE — Evaluation (Signed)
Physical Therapy Evaluation Patient Details Name: Tiffany [name] MRN: [id number] DOB: 29-Aug-1946 Today's Date: 07/13/2015   History of Present Illness  L THR; s/p R TKR, L lobectomy and cervical fusion  Clinical Impression  Pt s/p L THR presents with decreased L LE strength/ROM, generalized weakness and deconditioning, obesity and post op pain limiting functional mobility.  Pt should progress to dc home with family assist and HHPT follow up.    Follow Up Recommendations Home health PT    Equipment Recommendations  None recommended by PT    Recommendations for Other Services OT consult     Precautions / Restrictions Precautions Precautions: Fall Restrictions Weight Bearing Restrictions: No Other Position/Activity Restrictions: WBAT      Mobility  Bed Mobility Overal bed mobility: Needs Assistance;+2 for physical assistance Bed Mobility: Supine to Sit;Sit to Supine     Supine to sit: Mod assist;+2 for physical assistance Sit to supine: Mod assist;+2 for physical assistance   General bed mobility comments: cues for sequence and use of R LE to self assist  Transfers Overall transfer level: Needs assistance Equipment used: Rolling walker (2 wheeled) Transfers: Sit to/from Stand Sit to Stand: Mod assist;+2 physical assistance;From elevated surface         General transfer comment: cues for LE management and use of UEs to self assist  Ambulation/Gait Ambulation/Gait assistance: Mod assist;+2 physical assistance;+2 safety/equipment Ambulation Distance (Feet): 4 Feet Assistive device: Rolling walker (2 wheeled) Gait Pattern/deviations: Step-to pattern;Decreased step length - right;Decreased step length - left;Shuffle;Trunk flexed     General Gait Details: pt side stepped up side of bed.  Pt ltd this date by c/o back pain  Stairs            Wheelchair Mobility    Modified Rankin (Stroke Patients Only)       Balance                                              Pertinent Vitals/Pain Pain Assessment: 0-10 Pain Score: 6  Pain Location: back Pain Descriptors / Indicators: Aching;Sore;Spasm Pain Intervention(s): Limited activity within patient's tolerance;Monitored during session;Premedicated before session;Patient requesting pain meds-RN notified    Home Living Family/patient expects to be discharged to:: Private residence Living Arrangements: Spouse/significant other Available Help at Discharge: Family;Available 24 hours/day Type of Home: House Home Access: Stairs to enter Entrance Stairs-Rails: None Entrance Stairs-Number of Steps: 1 Home Layout: One level Home Equipment: Walker - 2 wheels;Grab bars - toilet;Grab bars - tub/shower;Wheelchair - manual      Prior Function Level of Independence: Needs assistance         Comments: Pt states she has been nonambulatory and using WC x 2 months     Hand Dominance   Dominant Hand: Right    Extremity/Trunk Assessment   Upper Extremity Assessment: Generalized weakness           Lower Extremity Assessment: Generalized weakness;RLE deficits/detail;LLE deficits/detail RLE Deficits / Details: Knee flex ltd ~ 90 LLE Deficits / Details: AAROM at hip to 50 flex and 10 abd; hip strength 2-/5  Cervical / Trunk Assessment: Normal  Communication   Communication: No difficulties  Cognition Arousal/Alertness: Awake/alert Behavior During Therapy: WFL for tasks assessed/performed Overall Cognitive Status: Within Functional Limits for tasks assessed  General Comments      Exercises Total Joint Exercises Ankle Circles/Pumps: AROM;Both;10 reps;Supine Heel Slides: AAROM;Left;5 reps;Supine Hip ABduction/ADduction: AAROM;Left;5 reps;Supine      Assessment/Plan    PT Assessment Patient needs continued PT services  PT Diagnosis Difficulty walking   PT Problem List Decreased strength;Decreased range of motion;Decreased activity  tolerance;Decreased mobility;Decreased knowledge of use of DME;Obesity;Pain  PT Treatment Interventions DME instruction;Gait training;Stair training;Functional mobility training;Therapeutic activities;Therapeutic exercise;Patient/family education   PT Goals (Current goals can be found in the Care Plan section) Acute Rehab PT Goals Patient Stated Goal: HOME PT Goal Formulation: With patient Time For Goal Achievement: 07/18/15 Potential to Achieve Goals: Fair    Frequency 7X/week   Barriers to discharge        Co-evaluation               End of Session Equipment Utilized During Treatment: Gait belt Activity Tolerance: Patient limited by pain Patient left: in bed;with call bell/phone within reach Nurse Communication: Mobility status         Time: 1610-9604 PT Time Calculation (min) (ACUTE ONLY): 30 min   Charges:   PT Evaluation $Initial PT Evaluation Tier I: 1 Procedure PT Treatments $Therapeutic Activity: 8-22 mins   PT G Codes:        Isley Zinni 07-21-2015, 5:13 PM

## 2015-07-13 NOTE — H&P (Signed)
TOTAL HIP ADMISSION H&P  Patient is admitted for left total hip arthroplasty.  Subjective:  Chief Complaint: left hip pain  HPI: LEBA TIBBITTS, 69 y.o. female, has a history of pain and functional disability in the left hip(s) due to arthritis and patient has failed non-surgical conservative treatments for greater than 12 weeks to include NSAID's and/or analgesics, corticosteriod injections, supervised PT with diminished ADL's post treatment, use of assistive devices and activity modification.  Onset of symptoms was abrupt starting 1 years ago with rapidlly worsening course since that time.The patient noted no past surgery on the left hip(s).  Patient currently rates pain in the left hip at 10 out of 10 with activity. Patient has night pain, worsening of pain with activity and weight bearing, trendelenberg gait, pain that interfers with activities of daily living and pain with passive range of motion. Patient has evidence of subchondral cysts, subchondral sclerosis, periarticular osteophytes, joint subluxation and joint space narrowing by imaging studies. This condition presents safety issues increasing the risk of falls.  There is no current active infection.  Patient Active Problem List   Diagnosis Date Noted  . Osteoarthritis of left hip 07/13/2015  . Duodenal ulcer with hemorrhage   . Acute esophagitis   . Gastric ulcer due to nonsteroidal antiinflammatory drug (NSAID) therapy   . Lung cancer 05/09/2015  . Peripheral edema 05/01/2015  . S/P bronchoscopy with biopsy   . Pulmonary nodule, left 04/06/2015  . Abnormal glucose 07/18/2014  . Vitamin D deficiency 07/18/2014  . Medication management 07/18/2014  . Hyperlipidemia 04/04/2014  . Charcot's Right foot 06/15/2013  . Low back pain 05/30/2013  . Gait disorder 05/16/2013  . Hypertension    Past Medical History  Diagnosis Date  . High blood pressure   . Gait difficulty     problems with balance  . Hyperlipidemia   . Pre-diabetes    . Vitamin D deficiency   . Obesity (BMI 35.0-39.9 without comorbidity)   . Neuropathy   . Hyperthyroidism mild  . Arthritis   . History of blood transfusion     05-14-15  . Anemia     Past Surgical History  Procedure Laterality Date  . Vaginal hysterectomy    . Cervical disc arthroplasty    . Rotator cuff repair Right   . Spinal fusion    . Knee replaced      right knee x 2  . Video bronchoscopy with endobronchial navigation N/A 04/18/2015    Procedure: VIDEO BRONCHOSCOPY WITH ENDOBRONCHIAL NAVIGATION;  Surgeon: Collene Gobble, MD;  Location: [facility];  Service: Thoracic;  Laterality: N/A;  . Video bronchoscopy with endobronchial ultrasound N/A 04/18/2015    Procedure: VIDEO BRONCHOSCOPY WITH ENDOBRONCHIAL ULTRASOUND;  Surgeon: Collene Gobble, MD;  Location: [facility];  Service: Thoracic;  Laterality: N/A;  . Video assisted thoracoscopy (vats)/wedge resection Left 05/09/2015    Procedure: LEFT VIDEO ASSISTED THORACOSCOPY (VATS) WITH LEFT LUNG UPPER LOBE WEDGE RESECTION;  Surgeon: [name], MD;  Location: [facility];  Service: Thoracic;  Laterality: Left;  . Lobectomy Left 05/09/2015    Procedure: LEFT LUNG UPPER LOBECTOMY;  Surgeon: [name], MD;  Location: [facility];  Service: Thoracic;  Laterality: Left;  . Cryo intercostal nerve block Left 05/09/2015    Procedure: CRYO INTERCOSTAL NERVE BLOCK;  Surgeon: [name], MD;  Location: [facility];  Service: Thoracic;  Laterality: Left;  . Lymph node dissection Left 05/09/2015    Procedure: LYMPH NODE DISSECTION;  Surgeon: Remo Lipps  Chaya Jan, MD;  Location: [facility];  Service: Thoracic;  Laterality: Left;  . Esophagogastroduodenoscopy N/A 05/16/2015    Procedure: ESOPHAGOGASTRODUODENOSCOPY (EGD);  Surgeon: Irene Shipper, MD;  Location: [facility] ENDOSCOPY;  Service: Endoscopy;  Laterality: N/A;  possible ablation of a bleeding lesion  . Tonsillectomy  age 23    Prescriptions prior to admission  Medication Sig Dispense Refill Last Dose  .  atenolol (TENORMIN) 100 MG tablet TAKE 1 TABLET BY MOUTH DAILY FOR BLOOD PRESSURE (Patient taking differently: TAKE 1/2 TABLET BY MOUTH DAILY FOR BLOOD PRESSURE) 90 tablet 3 07/13/2015 at 0400  . buPROPion (WELLBUTRIN SR) 150 MG 12 hr tablet TAKE 1 TABLET BY MOUTH TWICE DAILY 60 tablet 2 07/13/2015 at 0400  . Cholecalciferol (VITAMIN D PO) Take 5,000 Units by mouth 2 (two) times daily.    07/12/2015 at 2100  . diphenhydrAMINE (BENADRYL) 25 MG tablet Take 25 mg by mouth every 6 (six) hours as needed for allergies.    07/12/2015 at 1800  . enalapril (VASOTEC) 20 MG tablet TAKE 1 TABLET BY MOUTH DAILY FOR HIGH BLOOD PRESSURE 90 tablet 1 07/12/2015 at am  . furosemide (LASIX) 40 MG tablet TAKE 1 TABLET BY MOUTH EVERY MORNING 90 tablet 0 07/12/2015 at am  . HYDROcodone-acetaminophen (NORCO/VICODIN) 5-325 MG per tablet Take 1 tablet by mouth every 6 (six) hours as needed for severe pain. 30 tablet 0 07/13/2015 at 0230  . Multiple Vitamins-Minerals (MULTIVITAMIN PO) Take 1 tablet by mouth at bedtime.    07/12/2015 at pm  . pravastatin (PRAVACHOL) 40 MG tablet TAKE 1 TABLET BY MOUTH EVERY NIGHT AT BEDTIME FOR CHOLESTEROL 90 tablet 1 07/12/2015 at pm  . nystatin (MYCOSTATIN) 100000 UNIT/ML suspension Take 5 mLs (500,000 Units total) by mouth 4 (four) times daily. (Patient not taking: Reported on 07/06/2015) 60 mL 0 Taking  . pantoprazole (PROTONIX) 40 MG tablet Take 1 tablet (40 mg total) by mouth 2 (two) times daily. (Patient not taking: Reported on 07/06/2015) 60 tablet 1 Taking  . potassium chloride SA (K-DUR,KLOR-CON) 20 MEQ tablet Take 1 tablet (20 mEq total) by mouth daily. (Patient not taking: Reported on 07/06/2015) 30 tablet 1 Taking   Allergies  Allergen Reactions  . Other Other (See Comments)    Stool softeners caused stomach bloating, nausea, vomiting bleeding ulcer and blood transfusion    History  Substance Use Topics  . Smoking status: Former Smoker -- 1.00 packs/day for 30 years    Types: Cigarettes     Quit date: 05/08/2015  . Smokeless tobacco: Never Used  . Alcohol Use: 0.6 oz/week    1 Glasses of wine per week     Comment: 1 glass wine  daily    Family History  Problem Relation Age of Onset  . Heart attack Father   . Hypertension Mother   . Breast cancer Maternal Aunt   . Colon cancer Paternal Grandmother      Review of Systems  Respiratory: Positive for shortness of breath.   Musculoskeletal: Positive for joint pain.  All other systems reviewed and are negative.   Objective:  Physical Exam  Constitutional: She is oriented to person, place, and time. She appears well-developed and well-nourished.  HENT:  Head: Normocephalic and atraumatic.  Eyes: EOM are normal. Pupils are equal, round, and reactive to light.  Neck: Normal range of motion. Neck supple.  Cardiovascular: Normal rate.   Respiratory: Effort normal.  GI: Soft. Bowel sounds are normal.  Musculoskeletal:  Left hip: She exhibits decreased range of motion, decreased strength, tenderness and bony tenderness.  Neurological: She is alert and oriented to person, place, and time.  Skin: Skin is warm and dry.  Psychiatric: She has a normal mood and affect.    Vital signs in last 24 hours: Temp:  [97.8 F (36.6 C)] 97.8 F (36.6 C) (07/15 0520) Pulse Rate:  [77] 77 (07/15 0520) Resp:  [16] 16 (07/15 0520) BP: (113)/(61) 113/61 mmHg (07/15 0520) SpO2:  [97 %] 97 % (07/15 0520) Weight:  [89.5 kg (197 lb 5 oz)] 89.5 kg (197 lb 5 oz) (07/15 0555)  Labs:   Estimated body mass index is 30.9 kg/(m^2) as calculated from the following:   Height as of this encounter: '5\' 7"'$  (1.702 m).   Weight as of this encounter: 89.5 kg (197 lb 5 oz).   Imaging Review Plain radiographs demonstrate severe degenerative joint disease of the left hip(s). The bone quality appears to be good for age and reported activity level.  Assessment/Plan:  End stage arthritis, left hip(s)  The patient history, physical examination,  clinical judgement of the provider and imaging studies are consistent with end stage degenerative joint disease of the left hip(s) and total hip arthroplasty is deemed medically necessary. The treatment options including medical management, injection therapy, arthroscopy and arthroplasty were discussed at length. The risks and benefits of total hip arthroplasty were presented and reviewed. The risks due to aseptic loosening, infection, stiffness, dislocation/subluxation,  thromboembolic complications and other imponderables were discussed.  The patient acknowledged the explanation, agreed to proceed with the plan and consent was signed. Patient is being admitted for inpatient treatment for surgery, pain control, PT, OT, prophylactic antibiotics, VTE prophylaxis, progressive ambulation and ADL's and discharge planning.The patient is planning to be discharged to skilled nursing facility

## 2015-07-13 NOTE — Transfer of Care (Signed)
Immediate Anesthesia Transfer of Care Note  Patient: Tiffany [name]  Procedure(s) Performed: Procedure(s): LEFT TOTAL HIP ARTHROPLASTY ANTERIOR APPROACH (Left)  Patient Location: PACU  Anesthesia Type:Spinal  Level of Consciousness: awake, alert  and oriented  Airway & Oxygen Therapy: Patient Spontanous Breathing and Patient connected to face mask oxygen  Post-op Assessment: Report given to RN, Post -op Vital signs reviewed and stable and SAB level T4 - 5.  Post vital signs: Reviewed and stable  Last Vitals:  Filed Vitals:   07/13/15 0520  BP: 113/61  Pulse: 77  Temp: 36.6 C  Resp: 16    Complications: Patients alert and oriented but BP 79/42.   Neosynephrine drip prepared for PACU.

## 2015-07-13 NOTE — Anesthesia Postprocedure Evaluation (Signed)
  Anesthesia Post-op Note  Patient: Tiffany [name]  Procedure(s) Performed: Procedure(s) (LRB): LEFT TOTAL HIP ARTHROPLASTY ANTERIOR APPROACH (Left)  Patient Location: PACU  Anesthesia Type: Spinal  Level of Consciousness: awake and alert   Airway and Oxygen Therapy: Patient Spontanous Breathing  Post-op Pain: mild  Post-op Assessment: Post-op Vital signs reviewed, Patient's Cardiovascular Status Stable, Respiratory Function Stable, Patent Airway and No signs of Nausea or vomiting  Last Vitals:  Filed Vitals:   07/13/15 1010  BP: 118/49  Pulse:   Temp:   Resp:     Post-op Vital Signs: stable   Complications: No apparent anesthesia complications

## 2015-07-13 NOTE — Progress Notes (Signed)
CSW met with pt / spouse to assist with d/c planning. Pt plans to return home with [facility] services at d/c. Weekend CSW will review PT recommendations and assist with [facility] Re[facility] if plan changes and placement is needed.  Werner Lean LCSW [phone]

## 2015-07-13 NOTE — Progress Notes (Signed)
Utilization review completed.  

## 2015-07-13 NOTE — Brief Op Note (Signed)
07/13/2015  9:07 AM  PATIENT:  Tiffany [name]  69 y.o. female  PRE-OPERATIVE DIAGNOSIS:  severe osteoarthritis left hip  POST-OPERATIVE DIAGNOSIS:  severe osteoarthritis left hip  PROCEDURE:  Procedure(s): LEFT TOTAL HIP ARTHROPLASTY ANTERIOR APPROACH (Left)  SURGEON:  Surgeon(s) and Role:    * Mcarthur Rossetti, MD - Primary  PHYSICIAN ASSISTANT: Benita Stabile, PA-C  ANESTHESIA:   spinal  EBL:  Total I/O In: -  Out: 300 [Urine:150; Blood:150]  BLOOD ADMINISTERED:none  DRAINS: none   LOCAL MEDICATIONS USED:  NONE  SPECIMEN:  No Specimen  DISPOSITION OF SPECIMEN:  N/A  COUNTS:  YES  TOURNIQUET:  * No tourniquets in log *  DICTATION: .Other Dictation: Dictation Number [id number]  PLAN OF CARE: Admit to inpatient   PATIENT DISPOSITION:  PACU - hemodynamically stable.   Delay start of Pharmacological VTE agent (>24hrs) due to surgical blood loss or risk of bleeding: no

## 2015-07-14 LAB — CBC
HCT: 29.9 % — ABNORMAL LOW (ref 36.0–46.0)
Hemoglobin: 9.2 g/dL — ABNORMAL LOW (ref 12.0–15.0)
MCH: 28.3 pg (ref 26.0–34.0)
MCHC: 30.8 g/dL (ref 30.0–36.0)
MCV: 92 fL (ref 78.0–100.0)
PLATELETS: 302 10*3/uL (ref 150–400)
RBC: 3.25 MIL/uL — ABNORMAL LOW (ref 3.87–5.11)
RDW: 14.7 % (ref 11.5–15.5)
WBC: 9.3 10*3/uL (ref 4.0–10.5)

## 2015-07-14 LAB — BASIC METABOLIC PANEL
Anion gap: 5 (ref 5–15)
BUN: 7 mg/dL (ref 6–20)
CHLORIDE: 106 mmol/L (ref 101–111)
CO2: 27 mmol/L (ref 22–32)
Calcium: 8.3 mg/dL — ABNORMAL LOW (ref 8.9–10.3)
Creatinine, Ser: 0.72 mg/dL (ref 0.44–1.00)
GFR calc non Af Amer: >60 mL/min (ref >60)
Glucose, Bld: 111 mg/dL — ABNORMAL HIGH (ref 65–99)
Potassium: 4.6 mmol/L (ref 3.5–5.1)
Sodium: 138 mmol/L (ref 135–145)

## 2015-07-14 MED ORDER — HYDROCODONE-ACETAMINOPHEN 10-325 MG PO TABS
1.0000 | ORAL_TABLET | ORAL | Status: DC | PRN
  Administered 2015-07-14: 1 via ORAL
  Administered 2015-07-14: 2 via ORAL
  Administered 2015-07-14 – 2015-07-16 (×6): 1 via ORAL
  Filled 2015-07-14 (×6): qty 1
  Filled 2015-07-14: qty 2
  Filled 2015-07-14: qty 1

## 2015-07-14 NOTE — Evaluation (Signed)
Occupational Therapy Evaluation Patient Details Name: Tiffany [name] MRN: [id number] DOB: Feb 19, 1946 Today's Date: 07/14/2015    History of Present Illness L THR; s/p R TKR, L lobectomy and cervical fusion   Clinical Impression   Pt is s/p THA resulting in the deficits listed below (see OT Problem List).  Pt will benefit from skilled OT to increase their safety and independence with ADL and functional mobility for ADL to facilitate discharge to venue listed below.        Follow Up Recommendations  Home health OT    Equipment Recommendations  3 in 1 bedside comode    Recommendations for Other Services       Precautions / Restrictions Precautions Precautions: Fall Restrictions Weight Bearing Restrictions: No Other Position/Activity Restrictions: WBAT      Mobility Bed Mobility Overal bed mobility: Needs Assistance;+2 for physical assistance Bed Mobility: Supine to Sit           General bed mobility comments: cues for sequence and use of R LE to self assist  Transfers Overall transfer level: Needs assistance Equipment used: Rolling walker (2 wheeled) Transfers: Sit to/from [facility] Sit to Stand: Mod assist;+2 physical assistance;From elevated surface Stand pivot transfers: Mod assist       General transfer comment: cues for LE management and use of UEs to self assist    Balance                                            ADL Overall ADL's : Needs assistance/impaired                                       General ADL Comments: Session this day focused on transfer OOB with PT. Back pain limiting. OT role explained. Pt has AE amd states she is open to using but husband will also A     Vision     Perception     Praxis      Pertinent Vitals/Pain Pain Assessment: 0-10 Pain Score: 7  Pain Location: back Pain Descriptors / Indicators: Sore Pain Intervention(s): Limited activity within patient's  tolerance;Repositioned;Monitored during session     Hand Dominance Right   Extremity/Trunk Assessment Upper Extremity Assessment Upper Extremity Assessment: Generalized weakness           Communication Communication Communication: No difficulties   Cognition Arousal/Alertness: Awake/alert Behavior During Therapy: WFL for tasks assessed/performed Overall Cognitive Status: Within Functional Limits for tasks assessed                     General Comments       Exercises       Shoulder Instructions      Home Living Family/patient expects to be discharged to:: Private residence Living Arrangements: Spouse/significant other Available Help at Discharge: Family;Available 24 hours/day Type of Home: House Home Access: Stairs to enter [employer] of Steps: 1 Entrance Stairs-Rails: None Home Layout: One level     Bathroom Shower/Tub: Tub/shower unit;Curtain   Bathroom Toilet: Handicapped height Bathroom Accessibility: Yes   Home Equipment: Walker - 2 wheels;Grab bars - toilet;Grab bars - tub/shower;Wheelchair - manual          Prior Functioning/Environment Level of Independence: Needs assistance        Comments: Pt  states she has been nonambulatory and using WC x 2 months    OT Diagnosis: Generalized weakness;Acute pain   OT Problem List: Decreased strength;Decreased activity tolerance;Pain   OT Treatment/Interventions:      OT Goals(Current goals can be found in the care plan section) Acute Rehab OT Goals Patient Stated Goal: HOME OT Goal Formulation: With patient Time For Goal Achievement: 07/28/15 ADL Goals Pt Will Perform Lower Body Dressing: with min assist;sit to/from stand Pt Will Transfer to Toilet: with set-up;bedside commode;regular height toilet;ambulating Pt Will Perform Toileting - Clothing Manipulation and hygiene: with set-up;sit to/from stand  OT Frequency:     Barriers to D/C:            Co-evaluation               End of Session    Activity Tolerance: Patient limited by pain Patient left: in chair;with call bell/phone within reach   Time: 1034-1050 OT Time Calculation (min): 16 min Charges:  OT General Charges $OT Visit: 1 Procedure OT Evaluation $Initial OT Evaluation Tier I: 1 Procedure G-Codes:    Betsy Pries 07/26/2015, 11:54 AM

## 2015-07-14 NOTE — Discharge Instructions (Signed)

## 2015-07-14 NOTE — Op Note (Signed)
NAMEBRYCE, [name] NO.:  [id number]  MEDICAL RECORD NO.:  [id number]  LOCATION:  16                         FACILITY:  [facility]  PHYSICIAN:  Tiffany [name], M.D.DATE OF BIRTH:  1946-08-30  DATE OF PROCEDURE:  07/13/2015 DATE OF DISCHARGE:                              OPERATIVE REPORT   PREOPERATIVE DIAGNOSES:  Severe primary osteoarthritis and degenerative joint disease of left hip.  POSTOPERATIVE DIAGNOSES:  Severe primary osteoarthritis and degenerative joint disease of left hip.  PROCEDURE:  Left total hip arthroplasty through direct anterior approach.  IMPLANTS:  DePuy Sector Gription acetabular component size 48 with a single screw, size 32+ 4 neutral polyethylene liner, size 14 Corail femoral component with standard offset, size 32+ 1 ceramic hip ball.  SURGEON:  Tiffany [name], M.D.  ASSISTANT:  Tiffany Emery, PA-C.  ANESTHESIA:  Spinal.  ANTIBIOTICS:  2 g of IV Ancef.  BLOOD LOSS:  150 mL.  COMPLICATIONS:  None.  INDICATIONS:  Tiffany [name] is a well known to me.  Tiffany [name] is a 69 year old female with debilitating left hip pain in the hip that has severe osteoarthritis, unlikely an aspect of osteonecrosis.  Tiffany [name] leg is shortened.  Tiffany [name] hip ball is basically melting away.  Tiffany [name] is almost confined to a wheelchair.  At this point, Tiffany [name] pain is daily, it is affected Tiffany [name] quality of life and Tiffany [name] mobility as well as activities of daily living.  Tiffany [name] has gotten over left upper lobe lobectomy by the cardiothoracic surgeons and now has been cleared for surgery in attempts to hope to improve Tiffany [name] quality of life, decreased Tiffany [name] pain and improved Tiffany [name] mobility.  Tiffany [name] understands the risks of acute blood loss anemia, nerve or vessel injury, fracture, infection, dislocation of DVT, and Tiffany [name] does wish to proceed.  PROCEDURE DESCRIPTION:  After informed consent was obtained, appropriate left hip was marked.  Tiffany [name] was brought to the operating room,  spinal anesthesia was obtained while Tiffany [name] was on Tiffany [name] stretcher.  Tiffany [name] was then laid in a supine position on a stretcher.  A Foley catheter was placed, and then both feet had traction boots applied to them.  Next, Tiffany [name] was placed supine on the Hana fracture table with the perineal post in place and both legs in inline skeletal traction devices, but no traction applied.  Tiffany [name] left operative hip was then prepped and draped with DuraPrep and sterile drapes.  A time-out was called and Tiffany [name] was identified as correct patient and correct left hip.  I then made an incision inferior and posterior to the anterior superior iliac spine and carried this obliquely down the leg.  I dissected down the tensor fascia lata muscle and the tensor fascia was then divided longitudinally so I could proceed with a direct anterior approach to the hip.  I identified and cauterized the lateral femoral circumflex vessels and identified the femoral neck.  I placed femoral retractors/Cobra retractors around the medial and lateral femoral necks.  I then opened up the hip capsule in L- type format finding a very large joint effusion, which we irrigated out. We then made our femoral neck cut proximal to the lesser trochanter with an oscillating saw  and completed this with an osteotome and very incredibly soft bone.  We easily removed the femoral head in its entirety and found it to be severely flattened and necrotic as well as osteoarthritic.  We then cleaned the acetabulum and debris including remnants of the acetabular labrum.  I placed a bent Hohmann over the medial acetabular rim and then began reaming under direct visualization and 2-mm increments from size 44 all the way up to a size 48.  With the size 48, we placed the [name] DePuy Sector Gription acetabular component size 48 and a single screw.  We then placed the [name] 32+ 4 neutral polyethylene liner.  Attention was then turned to the femur.  With the leg externally  rotated to 100 degrees, extended and adducted, we were able to place a Mueller retractor medially and a Hohmann retractor behind the greater trochanter.  I released the lateral joint capsule and then used a box cutting osteotome to enter the femoral canal and a rongeur to lateralize.  We then began broaching from a size 8 broach using the Corail femoral broaching system up to a size 14.  With size 14, we trialed a standard neck and a 32+ 1 trial hip ball and brought the leg back over and up.  With traction and internal rotation, reducing the pelvis, we were pleased with offset, leg length and range of motion and tightness and assessment under direct fluoroscopy, visualization and a stress in the hip.  We then dislocated the hip and removed the trial components.  We placed the [name] Corail femoral component size 14 with standard offset and the [name] 32+ 1 ceramic hip ball.  We reduced this back in the acetabulum and again it was stable.  We then copiously irrigated the soft tissue with normal saline solution using pulsatile lavage.  We closed some remnants of the joint capsule with interrupted #1 Ethibond suture followed by running #1 Vicryl on the tensor fascia, 0 Vicryl in the deep tissue, 2-0 Vicryl in the subcutaneous tissue, interrupted staples on the skin, Xeroform and an Aquacel dressing.  Tiffany [name] was then taken off the Hana table and taken to the recovery room in stable condition.  All final counts were correct.  There were no complications noted.  Of note, Tiffany Real, PA-C assisted during the entire case and his assistance was crucial for facilitating all aspects of this case.     Tiffany [name], M.D.     CYB/MEDQ  D:  07/13/2015  T:  07/13/2015  Job:  [id number]

## 2015-07-14 NOTE — Progress Notes (Signed)
Physical Therapy Treatment Patient Details Name: Tiffany [name] MRN: [id number] DOB: 01/31/1946 Today's Date: 2015/07/28    History of Present Illness L THR; s/p R TKR, L lobectomy and cervical fusion    PT Comments    Pt progressing slowly with mobility - ltd by continued c/o back spasms with activity  Follow Up Recommendations  Home health PT     Equipment Recommendations  None recommended by PT    Recommendations for Other Services OT consult     Precautions / Restrictions Precautions Precautions: Fall Restrictions Weight Bearing Restrictions: No Other Position/Activity Restrictions: WBAT    Mobility  Bed Mobility Overal bed mobility: Needs Assistance;+2 for physical assistance Bed Mobility: Sit to Supine       Sit to supine: Mod assist;+2 for physical assistance   General bed mobility comments: cues for sequence and use of R LE to self assist  Transfers Overall transfer level: Needs assistance Equipment used: Rolling walker (2 wheeled) Transfers: Sit to/from Stand Sit to Stand: Mod assist;+2 physical assistance         General transfer comment: cues for LE management and use of UEs to self assist  Ambulation/Gait Ambulation/Gait assistance: Min assist;+2 safety/equipment;+2 physical assistance Ambulation Distance (Feet): 22 Feet Assistive device: Rolling walker (2 wheeled) Gait Pattern/deviations: Step-to pattern;Decreased step length - right;Decreased step length - left;Shuffle;Trunk flexed Gait velocity: decr   General Gait Details: cues for sequence, posture and position from [employer]            Wheelchair Mobility    Modified Rankin (Stroke Patients Only)       Balance                                    Cognition Arousal/Alertness: Awake/alert Behavior During Therapy: WFL for tasks assessed/performed Overall Cognitive Status: Within Functional Limits for tasks assessed                       Exercises      General Comments        Pertinent Vitals/Pain Pain Assessment: 0-10 Pain Score: 6  Pain Location: back Pain Descriptors / Indicators: Aching;Sore Pain Intervention(s): Limited activity within patient's tolerance;Monitored during session;Premedicated before session;Ice applied    Home Living                      Prior Function            PT Goals (current goals can now be found in the care plan section) Acute Rehab PT Goals Patient Stated Goal: HOME PT Goal Formulation: With patient Time For Goal Achievement: 07/18/15 Potential to Achieve Goals: Fair Progress towards PT goals: Progressing toward goals    Frequency  7X/week    PT Plan Current plan remains appropriate    Co-evaluation             End of Session Equipment Utilized During Treatment: Gait belt Activity Tolerance: Patient limited by pain Patient left: in bed;with call bell/phone within reach;with family/visitor present     Time: 1327-1350 PT Time Calculation (min) (ACUTE ONLY): 23 min  Charges:  $Gait Training: 23-37 mins                    G Codes:      Ziquan Fidel 2015-07-28, 4:54 PM

## 2015-07-14 NOTE — Care Management Note (Deleted)
Case Management Note  Patient Details  Name: MERNA BALDI MRN: [id number] Date of Birth: 01-Jan-1946  Subjective/Objective:                    Action/Plan:   Expected Discharge Date:  07/15/15               Expected Discharge Plan:  [place]  In-House Referral:     Discharge planning Services  CM Consult  Post Acute Care Choice:  NA Choice offered to:  Patient  DME Arranged:    DME Agency:  NA  HH Arranged:  PT HH Agency:  [place]  Status of Service:  Completed, signed off  Medicare Important Message Given:    Date Medicare IM Given:    Medicare IM give by:    Date Additional Medicare IM Given:    Additional Medicare Important Message give by:     If discussed at [place] of Stay Meetings, dates discussed:    Additional Comments:  Apolonio Schneiders, RN 07/14/2015, 10:32 AM

## 2015-07-14 NOTE — Care Management Note (Signed)
Case Management Note  Patient Details  Name: ZYKERIAH MATHIA MRN: [id number] Date of Birth: [birth year]  Subjective/Objective:                  LEFT TOTAL HIP ARTHROPLASTY ANTERIOR APPROACH (Left)  Action/Plan:  Discharge Planning  Expected Discharge Date:  07/15/15               Expected Discharge Plan:  [place]  In-House Referral:     Discharge planning Services  CM Consult  Post Acute Care Choice:  NA Choice offered to:  Patient  DME Arranged:    DME Agency:  NA  HH Arranged:  PT HH Agency:  [place]  Status of Service:  Completed, signed off  Medicare Important Message Given:    Date Medicare IM Given:    Medicare IM give by:    Date Additional Medicare IM Given:    Additional Medicare Important Message give by:     If discussed at [place] of Stay Meetings, dates discussed:    Additional Comments:  CM spoke with patient. She has a RW and 3N1 at home. [facility] pre-operatively set-up for HHPT. Patient is agreeable.   Apolonio Schneiders, RN 07/14/2015, 10:29 AM

## 2015-07-14 NOTE — Progress Notes (Signed)
Physical Therapy Treatment Patient Details Name: Tiffany [name] MRN: [id number] DOB: 18-May-1946 Today's Date: 07/14/2015    History of Present Illness L THR; s/p R TKR, L lobectomy and cervical fusion    PT Comments    Pt progressing slowly with mobility - ltd by post op back spasms with movement  Follow Up Recommendations  Home health PT     Equipment Recommendations  None recommended by PT    Recommendations for Other Services OT consult     Precautions / Restrictions Precautions Precautions: Fall Restrictions Weight Bearing Restrictions: No Other Position/Activity Restrictions: WBAT    Mobility  Bed Mobility Overal bed mobility: Needs Assistance;+2 for physical assistance Bed Mobility: Supine to Sit     Supine to sit: Mod assist;+2 for physical assistance     General bed mobility comments: cues for sequence and use of R LE to self assist  Transfers Overall transfer level: Needs assistance Equipment used: Rolling walker (2 wheeled) Transfers: Sit to/from [employer] Sit to Stand: Mod assist;+2 physical assistance;From elevated surface Stand pivot transfers: Mod assist       General transfer comment: cues for LE management and use of UEs to self assist  Ambulation/Gait Ambulation/Gait assistance: +2 physical assistance;Mod assist Ambulation Distance (Feet): 6 Feet Assistive device: Rolling walker (2 wheeled) Gait Pattern/deviations: Step-to pattern;Decreased step length - right;Decreased step length - left;Shuffle;Trunk flexed Gait velocity: decr   General Gait Details: cues for sequence, posture and position from [employer]            Wheelchair Mobility    Modified Rankin (Stroke Patients Only)       Balance                                    Cognition Arousal/Alertness: Awake/alert Behavior During Therapy: WFL for tasks assessed/performed Overall Cognitive Status: Within Functional Limits for tasks  assessed                      Exercises Total Joint Exercises Ankle Circles/Pumps: AROM;Both;10 reps;Supine Quad Sets: AROM;Both;10 reps;Supine Heel Slides: AAROM;Left;Supine;15 reps Hip ABduction/ADduction: AAROM;Left;Supine;15 reps    General Comments        Pertinent Vitals/Pain Pain Assessment: 0-10 Pain Score: 7  Pain Location: back Pain Descriptors / Indicators: Sore Pain Intervention(s): Limited activity within patient's tolerance;Repositioned;Monitored during session    Home Living Family/patient expects to be discharged to:: Private residence Living Arrangements: Spouse/significant other Available Help at Discharge: Family;Available 24 hours/day Type of Home: House Home Access: Stairs to enter Entrance Stairs-Rails: None Home Layout: One level Home Equipment: Walker - 2 wheels;Grab bars - toilet;Grab bars - tub/shower;Wheelchair - manual      Prior Function Level of Independence: Needs assistance      Comments: Pt states she has been nonambulatory and using WC x 2 months   PT Goals (current goals can now be found in the care plan section) Acute Rehab PT Goals Patient Stated Goal: HOME PT Goal Formulation: With patient Time For Goal Achievement: 07/18/15 Potential to Achieve Goals: Fair Progress towards PT goals: Progressing toward goals    Frequency  7X/week    PT Plan Current plan remains appropriate    Co-evaluation             End of Session Equipment Utilized During Treatment: Gait belt Activity Tolerance: Patient limited by pain Patient left: in chair;with call bell/phone  within reach     Time: 1020-1108 PT Time Calculation (min) (ACUTE ONLY): 48 min  Charges:  $Gait Training: 8-22 mins $Therapeutic Exercise: 8-22 mins                    G Codes:      Aniah Pauli 08/04/2015, 12:53 PM

## 2015-07-14 NOTE — Progress Notes (Signed)
Subjective: 1 Day Post-Op Procedure(s) (LRB): LEFT TOTAL HIP ARTHROPLASTY ANTERIOR APPROACH (Left) Patient reports pain as moderate.  Back pain actually worse than her hip pain.  Acute blood loss anemia, but stable vitals.  Objective: Vital signs in last 24 hours: Temp:  [96 F (35.6 C)-99.3 F (37.4 C)] 98.5 F (36.9 C) (07/16 0434) Pulse Rate:  [71-83] 72 (07/16 0434) Resp:  [12-28] 16 (07/16 0434) BP: (79-131)/(40-79) 120/74 mmHg (07/16 0434) SpO2:  [95 %-100 %] 100 % (07/16 0434)  Intake/Output from previous day: 07/15 0701 - 07/16 0700 In: 4310 [P.O.:600; I.V.:3710] Out: 925 [Urine:775; Blood:150] Intake/Output this shift:     Recent Labs  07/14/15 0534  HGB 9.2*    Recent Labs  07/14/15 0534  WBC 9.3  RBC 3.25*  HCT 29.9*  PLT 302    Recent Labs  07/14/15 0534  NA 138  K 4.6  CL 106  CO2 27  BUN 7  CREATININE 0.72  GLUCOSE 111*  CALCIUM 8.3*   No results for input(s): LABPT, INR in the last 72 hours.  Sensation intact distally Intact pulses distally Dorsiflexion/Plantar flexion intact Incision: scant drainage  Assessment/Plan: 1 Day Post-Op Procedure(s) (LRB): LEFT TOTAL HIP ARTHROPLASTY ANTERIOR APPROACH (Left) Up with therapy  Jacoby Ritsema Y 07/14/2015, 8:32 AM

## 2015-07-15 LAB — CBC
HCT: 27.4 % — ABNORMAL LOW (ref 36.0–46.0)
Hemoglobin: 8.9 g/dL — ABNORMAL LOW (ref 12.0–15.0)
MCH: 29.5 pg (ref 26.0–34.0)
MCHC: 32.5 g/dL (ref 30.0–36.0)
MCV: 90.7 fL (ref 78.0–100.0)
Platelets: 297 10*3/uL (ref 150–400)
RBC: 3.02 MIL/uL — ABNORMAL LOW (ref 3.87–5.11)
RDW: 14.6 % (ref 11.5–15.5)
WBC: 11.2 10*3/uL — ABNORMAL HIGH (ref 4.0–10.5)

## 2015-07-15 NOTE — Progress Notes (Signed)
Physical Therapy Treatment Patient Details Name: SHERLEY MCKENNEY MRN: [id number] DOB: November 18, 1946 Today's Date: 2015/08/06    History of Present Illness L THR; s/p R TKR, L lobectomy and cervical fusion    PT Comments    Progressing, although slowly (very limited mobility prior to surgery)  Follow Up Recommendations  Home health PT;Supervision/Assistance - 24 hour;Supervision for mobility/OOB     Equipment Recommendations  None recommended by PT    Recommendations for Other Services OT consult     Precautions / Restrictions Precautions Precautions: Fall Restrictions Weight Bearing Restrictions: No Other Position/Activity Restrictions: WBAT    Mobility  Bed Mobility Overal bed mobility: Needs Assistance Bed Mobility: Rolling;Sidelying to Sit Rolling: Min assist;Mod assist Sidelying to sit: Mod assist       General bed mobility comments: cues for technique, used rail, HOB at 45*  Transfers Overall transfer level: Needs assistance Equipment used: Rolling walker (2 wheeled) Transfers: Sit to/from Stand Sit to Stand: Mod assist;+2 physical assistance         General transfer comment: cues for LE management and use of UEs to self assist/control descent  Ambulation/Gait Ambulation/Gait assistance: Min assist Ambulation Distance (Feet): 38 Feet Assistive device: Rolling walker (2 wheeled) Gait Pattern/deviations: Step-to pattern;Antalgic;Trunk flexed;Shuffle Gait velocity: decr   General Gait Details: cues for sequence, posture and position from [employer]            Wheelchair Mobility    Modified Rankin (Stroke Patients Only)       Balance                                    Cognition Arousal/Alertness: Awake/alert Behavior During Therapy: WFL for tasks assessed/performed Overall Cognitive Status: Within Functional Limits for tasks assessed                      Exercises Total Joint Exercises Ankle Circles/Pumps:  AROM;Both;10 reps;Supine Quad Sets: AROM;Both;10 reps;Supine Heel Slides: AAROM;Left;10 reps Hip ABduction/ADduction: AAROM;Left;10 reps    General Comments        Pertinent Vitals/Pain Pain Assessment: 0-10 Pain Score: 5  Pain Location: back Pain Descriptors / Indicators: Aching Pain Intervention(s): Limited activity within patient's tolerance;Monitored during session;Premedicated before session;Ice applied;Repositioned    Home Living                      Prior Function            PT Goals (current goals can now be found in the care plan section) Acute Rehab PT Goals Patient Stated Goal: HOME PT Goal Formulation: With patient Time For Goal Achievement: 07/18/15 Potential to Achieve Goals: Fair Progress towards PT goals: Progressing toward goals    Frequency  7X/week    PT Plan Current plan remains appropriate    Co-evaluation             End of Session Equipment Utilized During Treatment: Gait belt Activity Tolerance: Patient tolerated treatment well;Patient limited by fatigue Patient left: in chair;with call bell/phone within reach     Time: 1143-1200 PT Time Calculation (min) (ACUTE ONLY): 17 min  Charges:  $Gait Training: 8-22 mins                    G Codes:      Cinda Hara 08/06/15, 11:59 AM

## 2015-07-15 NOTE — Progress Notes (Signed)
   07/15/15 1300  PT Visit Information  Last PT Received On 07/15/15  Assistance Needed +1  History of Present Illness L THR; s/p R TKR, L lobectomy and cervical fusion  PT Time Calculation  PT Start Time (ACUTE ONLY) 1345  Subjective Data  Patient Stated Goal HOME  Precautions  Precautions Fall  Restrictions  Other Position/Activity Restrictions WBAT  Pain Assessment  Pain Assessment 0-10  Pain Score 3  Pain Location back, L hip  Pain Descriptors / Indicators Sore  Pain Intervention(s) Limited activity within patient's tolerance;Monitored during session;Repositioned  Cognition  Arousal/Alertness Awake/alert  Behavior During Therapy WFL for tasks assessed/performed  Overall Cognitive Status Within Functional Limits for tasks assessed  Bed Mobility  Overal bed mobility Needs Assistance  Bed Mobility Sit to Sidelying  Sit to sidelying Min assist;Mod assist  General bed mobility comments cues for technique, used rail, HOB at 30*  Transfers  Overall transfer level Needs assistance  Equipment used Rolling walker (2 wheeled)  Transfers Sit to/from [employer]  Sit to Stand Min assist  Stand pivot transfers Min assist  General transfer comment momentum and counting used for transfer, cues for proper hand placement and LE position, control of descent  Ambulation/Gait  Ambulation/Gait assistance Min assist  Ambulation Distance (Feet) 6 Feet  Assistive device Rolling walker (2 wheeled)  Gait Pattern/deviations Step-to pattern;Trunk flexed  General Gait Details cues for sequence, posture and position from RW  Gait velocity decr  PT - End of Session  Activity Tolerance Patient tolerated treatment well  Patient left in bed;with call bell/phone within reach  Nurse Communication Mobility status  PT - Assessment/Plan  PT Plan Current plan remains appropriate  PT Frequency (ACUTE ONLY) 7X/week  Recommendations for Other Services OT consult  Follow Up Recommendations  Home health PT;Supervision/Assistance - 24 hour;Supervision for mobility/OOB  PT equipment None recommended by PT  PT Goal Progression  Progress towards PT goals Progressing toward goals  Acute Rehab PT Goals  PT Goal Formulation With patient  Time For Goal Achievement 07/18/15  Potential to Achieve Goals Fair  PT General Charges  $$ ACUTE PT VISIT 1 Procedure  PT Treatments  $Therapeutic Activity 8-22 mins

## 2015-07-15 NOTE — Progress Notes (Signed)
Subjective: Pt stable - feeling good - has been walking in room   Objective: Vital signs in last 24 hours: Temp:  [98.3 F (36.8 C)-99.1 F (37.3 C)] 98.6 F (37 C) (07/17 0440) Pulse Rate:  [78-91] 91 (07/17 0440) Resp:  [16-20] 16 (07/17 0440) BP: (94-115)/(61-73) 115/72 mmHg (07/17 0440) SpO2:  [95 %-97 %] 96 % (07/17 0440)  Intake/Output from previous day: 07/16 0701 - 07/17 0700 In: 863.8 [P.O.:720; I.V.:143.8] Out: 675 [Urine:675] Intake/Output this shift:    Exam:  Dorsiflexion/Plantar flexion intact  Labs:  Recent Labs  07/14/15 0534 07/15/15 0454  HGB 9.2* 8.9*    Recent Labs  07/14/15 0534 07/15/15 0454  WBC 9.3 11.2*  RBC 3.25* 3.02*  HCT 29.9* 27.4*  PLT 302 297    Recent Labs  07/14/15 0534  NA 138  K 4.6  CL 106  CO2 27  BUN 7  CREATININE 0.72  GLUCOSE 111*  CALCIUM 8.3*   No results for input(s): LABPT, INR in the last 72 hours.  Assessment/Plan: Plan dc am - labs ok   DEAN,GREGORY SCOTT 07/15/2015, 10:01 AM

## 2015-07-16 ENCOUNTER — Encounter (HOSPITAL_COMMUNITY): Payer: Self-pay | Admitting: Orthopaedic Surgery

## 2015-07-16 ENCOUNTER — Other Ambulatory Visit: Payer: Self-pay | Admitting: Physician Assistant

## 2015-07-16 LAB — CBC
HCT: 27.5 % — ABNORMAL LOW (ref 36.0–46.0)
HEMOGLOBIN: 8.8 g/dL — AB (ref 12.0–15.0)
MCH: 29 pg (ref 26.0–34.0)
MCHC: 32 g/dL (ref 30.0–36.0)
MCV: 90.8 fL (ref 78.0–100.0)
Platelets: 295 10*3/uL (ref 150–400)
RBC: 3.03 MIL/uL — AB (ref 3.87–5.11)
RDW: 14.7 % (ref 11.5–15.5)
WBC: 10.5 10*3/uL (ref 4.0–10.5)

## 2015-07-16 MED ORDER — ASPIRIN 325 MG PO TBEC: 325.0000 mg | DELAYED_RELEASE_TABLET | Freq: Two times a day (BID) | ORAL | Status: DC

## 2015-07-16 MED ORDER — OXYCODONE-ACETAMINOPHEN 5-325 MG PO TABS: 1.0000 | ORAL_TABLET | ORAL | Status: DC | PRN

## 2015-07-16 NOTE — Discharge Summary (Signed)
Patient ID: Tiffany [name] MRN: [id number] DOB/AGE: 1946/10/26 69 y.o.  Admit date: 07/13/2015 Discharge date: 07/16/2015  Admission Diagnoses:  Principal Problem:   Osteoarthritis of left hip Active Problems:   Status post total replacement of left hip   Discharge Diagnoses:  Same  Past Medical History  Diagnosis Date  . High blood pressure   . Gait difficulty     problems with balance  . Hyperlipidemia   . Pre-diabetes   . Vitamin D deficiency   . Obesity (BMI 35.0-39.9 without comorbidity)   . Neuropathy   . Hyperthyroidism mild  . Arthritis   . History of blood transfusion     05-14-15  . Anemia     Surgeries: Procedure(s): LEFT TOTAL HIP ARTHROPLASTY ANTERIOR APPROACH on 07/13/2015   Consultants:    Discharged Condition: Improved  Hospital Course: Tiffany [name] is an 69 y.o. female who was admitted 07/13/2015 for operative treatment ofOsteoarthritis of left hip. Patient has severe unremitting pain that affects sleep, daily activities, and work/hobbies. After pre-op clearance the patient was taken to the operating room on 07/13/2015 and underwent  Procedure(s): LEFT TOTAL HIP ARTHROPLASTY ANTERIOR APPROACH.    Patient was given perioperative antibiotics: Anti-infectives    Start     Dose/Rate Route Frequency Ordered Stop   07/13/15 1400  ceFAZolin (ANCEF) IVPB 1 g/50 mL premix     1 g 100 mL/hr over 30 Minutes Intravenous Every 6 hours 07/13/15 1230 07/13/15 2019   07/13/15 0524  ceFAZolin (ANCEF) IVPB 2 g/50 mL premix     2 g 100 mL/hr over 30 Minutes Intravenous On call to O.R. 07/13/15 0102 07/13/15 0836       Patient was given sequential compression devices, early ambulation, and chemoprophylaxis to prevent DVT.  Patient benefited maximally from hospital stay and there were no complications.    Recent vital signs: Patient Vitals for the past 24 hrs:  BP Temp Temp src Pulse Resp SpO2  07/16/15 0500 113/68 mmHg 98.9 F (37.2 C) Oral 79 18 98 %   07/15/15 2045 130/76 mmHg 98.8 F (37.1 C) Oral 82 18 97 %  07/15/15 1419 104/71 mmHg 98.9 F (37.2 C) Oral 79 16 97 %     Recent laboratory studies:  Recent Labs  07/14/15 0534 07/15/15 0454 07/16/15 0403  WBC 9.3 11.2* 10.5  HGB 9.2* 8.9* 8.8*  HCT 29.9* 27.4* 27.5*  PLT 302 297 295  NA 138  --   --   K 4.6  --   --   CL 106  --   --   CO2 27  --   --   BUN 7  --   --   CREATININE 0.72  --   --   GLUCOSE 111*  --   --   CALCIUM 8.3*  --   --      Discharge Medications:     Medication List    STOP taking these medications        HYDROcodone-acetaminophen 5-325 MG per tablet  Commonly known as:  NORCO/VICODIN      TAKE these medications        aspirin 325 MG EC tablet  Take 1 tablet (325 mg total) by mouth 2 (two) times daily after a meal.     atenolol 100 MG tablet  Commonly known as:  TENORMIN  TAKE 1 TABLET BY MOUTH DAILY FOR BLOOD PRESSURE     buPROPion 150 MG 12 hr tablet  Commonly known as:  WELLBUTRIN SR  TAKE 1 TABLET BY MOUTH TWICE DAILY     diphenhydrAMINE 25 MG tablet  Commonly known as:  BENADRYL  Take 25 mg by mouth every 6 (six) hours as needed for allergies.     enalapril 20 MG tablet  Commonly known as:  VASOTEC  TAKE 1 TABLET BY MOUTH DAILY FOR HIGH BLOOD PRESSURE     furosemide 40 MG tablet  Commonly known as:  LASIX  TAKE 1 TABLET BY MOUTH EVERY MORNING     MULTIVITAMIN PO  Take 1 tablet by mouth at bedtime.     nystatin 100000 UNIT/ML suspension  Commonly known as:  MYCOSTATIN  Take 5 mLs (500,000 Units total) by mouth 4 (four) times daily.     oxyCODONE-acetaminophen 5-325 MG per tablet  Commonly known as:  ROXICET  Take 1-2 tablets by mouth every 4 (four) hours as needed for severe pain.     pantoprazole 40 MG tablet  Commonly known as:  PROTONIX  Take 1 tablet (40 mg total) by mouth 2 (two) times daily.     potassium chloride SA 20 MEQ tablet  Commonly known as:  K-DUR,KLOR-CON  Take 1 tablet (20 mEq total) by  mouth daily.     pravastatin 40 MG tablet  Commonly known as:  PRAVACHOL  TAKE 1 TABLET BY MOUTH EVERY NIGHT AT BEDTIME FOR CHOLESTEROL     VITAMIN D PO  Take 5,000 Units by mouth 2 (two) times daily.        Diagnostic Studies: Dg C-arm 61-120 Min-no Report  07/13/2015   CLINICAL DATA: surg   C-ARM 61-120 MINUTES  Fluoroscopy was utilized by the requesting physician.  No radiographic  interpretation.    Dg Hip Port Unilat With Pelvis 1v Left  07/13/2015   CLINICAL DATA:  Left hip replacement  EXAM: DG HIP (WITH OR WITHOUT PELVIS) 1V PORT LEFT  COMPARISON:  None.  FINDINGS: Interval total left hip arthroplasty without failure or complication. No acute fracture or dislocation. Postsurgical changes in the surrounding soft tissues.  Generalized osteopenia.  No right hip fracture or dislocation.  IMPRESSION: Interval total left hip arthroplasty.   Electronically Signed   By: Kathreen Devoid   On: 07/13/2015 10:36   Dg Hip Operative Unilat With Pelvis Left  07/13/2015   CLINICAL DATA:  Postop from left hip arthroplasty.  EXAM: OPERATIVE LEFT HIP (WITH PELVIS IF PERFORMED) 2 VIEWS  TECHNIQUE: Fluoroscopic spot image(s) were submitted for interpretation post-operatively.  COMPARISON:  None.  FINDINGS: Bipolar left hip prosthesis is seen in expected position. No evidence of fracture or dislocation.  IMPRESSION: Left hip prosthesis in appropriate position. No fracture or other complication identified.   Electronically Signed   By: Earle Gell M.D.   On: 07/13/2015 09:17    Disposition: 01-Home or Self Care      Discharge Instructions    Discharge patient    Complete by:  As directed            Follow-up Information    Follow up with Mcarthur Rossetti, MD In 2 weeks.   Specialty:  Orthopedic Surgery   Contact information:   [place] [address] [phone]        Signed: Mcarthur Rossetti 07/16/2015, 7:37 AM   !

## 2015-07-16 NOTE — Progress Notes (Signed)
Physical Therapy Treatment Patient Details Name: Tiffany [name] MRN: [id number] DOB: September 19, 1946 Today's Date: 07/16/2015    History of Present Illness L THR; s/p R TKR, L lobectomy and cervical fusion    PT Comments    POD # 3 pt dressed and in recliner with spouse in room.  Max c/o fatigue from prior OT session and morning ADL's.  Too fatigued to amb, assisted to [facility].  Pt able to perform self hygiene at Supervision then back to recliner.  Max c/o fatigue, requiring a rest break while I instructed spouse on safe handling, assist level with L LE  and proper car tranfers.  Performed some TE's following HEP handout.  Spouse plans to provide 24/7 care, which pt will need to D/C to home.   Follow Up Recommendations  Home health PT;Supervision/Assistance - 24 hour;Supervision for mobility/OOB (spouse plans to provide 24/7)     Equipment Recommendations  None recommended by PT    Recommendations for Other Services       Precautions / Restrictions Precautions Precautions: Fall Restrictions Weight Bearing Restrictions: No Other Position/Activity Restrictions: WBAT    Mobility  Bed Mobility               General bed mobility comments: pt in chair  Transfers Overall transfer level: Needs assistance Equipment used: Rolling walker (2 wheeled) Transfers: Sit to/from Stand Sit to Stand: Min guard Stand pivot transfers: Min guard       General transfer comment: increased time   Ambulation/Gait Ambulation/Gait assistance: Min guard;Min assist Ambulation Distance (Feet): 1 Feet Assistive device: Rolling walker (2 wheeled) Gait Pattern/deviations: Step-to pattern;Trunk flexed     General Gait Details: cues for sequence, posture and position from RW plus increased time   Stairs Stairs:  (no steps to enter home)          Wheelchair Mobility    Modified Rankin (Stroke Patients Only)       Balance                                    Cognition  Arousal/Alertness: Awake/alert Behavior During Therapy: WFL for tasks assessed/performed Overall Cognitive Status: Within Functional Limits for tasks assessed                      Exercises      General Comments        Pertinent Vitals/Pain Pain Assessment: 0-10 Pain Score: 5  Pain Location: L hip Pain Descriptors / Indicators: Aching;Sore Pain Intervention(s): Monitored during session    Home Living                      Prior Function            PT Goals (current goals can now be found in the care plan section) Progress towards PT goals: Progressing toward goals    Frequency  7X/week    PT Plan      Co-evaluation             End of Session Equipment Utilized During Treatment: Gait belt Activity Tolerance: Patient limited by fatigue Patient left: in chair;with call bell/phone within reach;with family/visitor present     Time: 7654-6503 PT Time Calculation (min) (ACUTE ONLY): 25 min  Charges:  $Therapeutic Exercise: 8-22 mins $Therapeutic Activity: 8-22 mins  G Codes:      Rica Koyanagi  PTA [facility]  Acute  Rehab Pager      [phone]

## 2015-07-16 NOTE — Progress Notes (Signed)
Occupational Therapy Treatment Patient Details Name: Tiffany [name] MRN: [id number] DOB: [birth year] Today's Date: 07/16/2015    History of present illness L THR; s/p R TKR, L lobectomy and cervical fusion   OT comments  Husband will A as able  Follow Up Recommendations  Home health OT    Equipment Recommendations  None recommended by OT       Precautions / Restrictions Precautions Precautions: Fall Restrictions Weight Bearing Restrictions: No Other Position/Activity Restrictions: WBAT       Mobility Bed Mobility               General bed mobility comments: pt in chair  Transfers Overall transfer level: Needs assistance Equipment used: Rolling walker (2 wheeled) Transfers: Sit to/from Stand Sit to Stand: Min guard Stand pivot transfers: Min guard       General transfer comment: VC for for hand placement        ADL Overall ADL's : Needs assistance/impaired                 Upper Body Dressing : Set up;Sitting   Lower Body Dressing: Minimal assistance;Sit to/from stand;Cueing for sequencing;Cueing for safety;With adaptive equipment   Toilet Transfer: Min guard;Ambulation;Comfort height toilet   Toileting- Clothing Manipulation and Hygiene: Minimal assistance;Sit to/from stand         General ADL Comments: much improved. husband will as needed.educated pt on use of walker bag                Cognition   Behavior During Therapy: WFL for tasks assessed/performed Overall Cognitive Status: Within Functional Limits for tasks assessed                               General Comments  husband will A as able    Pertinent Vitals/ Pain       Pain Score: 4  Pain Location: back/  L hip Pain Descriptors / Indicators: Sore;Aching Pain Intervention(s): Limited activity within patient's tolerance;Monitored during session;Repositioned         Frequency       Progress Toward Goals  OT Goals(current goals can now be found in the  care plan section)  Progress towards OT goals: Progressing toward goals     Plan Discharge plan remains appropriate       End of Session     Activity Tolerance Patient tolerated treatment well   Patient Left in chair;with call bell/phone within reach;with family/visitor present   Nurse Communication          Time: 0100-7121 OT Time Calculation (min): 20 min  Charges: OT General Charges $OT Visit: 1 Procedure OT Treatments $Self Care/Home Management : 8-22 mins  Nina Hoar D 07/16/2015, 10:10 AM

## 2015-07-16 NOTE — Care Management Important Message (Signed)
Important Message  Patient Details  Name: Tiffany [name] MRN: [id number] Date of Birth: 1946-10-31   Medicare Important Message Given:  Yes-second notification given    Camillo Flaming 07/16/2015, 12:26 [place] Message  Patient Details  Name: Tiffany [name] MRN: [id number] Date of Birth: [birth year]   Medicare Important Message Given:  Yes-second notification given    Camillo Flaming 07/16/2015, 12:26 PM

## 2015-07-16 NOTE — Progress Notes (Signed)
Patient ID: Tiffany [name], female   DOB: 31-Oct-1946, 69 y.o.   MRN: [id number] Doing very well.  Vitals stable.  Labs stable.  Mobilizing well.  Can discharge to home today.

## 2015-07-17 DIAGNOSIS — I10 Essential (primary) hypertension: Secondary | ICD-10-CM | POA: Diagnosis not present

## 2015-07-17 DIAGNOSIS — E669 Obesity, unspecified: Secondary | ICD-10-CM | POA: Diagnosis not present

## 2015-07-17 DIAGNOSIS — M1990 Unspecified osteoarthritis, unspecified site: Secondary | ICD-10-CM | POA: Diagnosis not present

## 2015-07-17 DIAGNOSIS — G609 Hereditary and idiopathic neuropathy, unspecified: Secondary | ICD-10-CM | POA: Diagnosis not present

## 2015-07-17 DIAGNOSIS — Z471 Aftercare following joint replacement surgery: Secondary | ICD-10-CM | POA: Diagnosis not present

## 2015-07-17 DIAGNOSIS — M14671 Charcot's joint, right ankle and foot: Secondary | ICD-10-CM | POA: Diagnosis not present

## 2015-07-20 DIAGNOSIS — I10 Essential (primary) hypertension: Secondary | ICD-10-CM | POA: Diagnosis not present

## 2015-07-20 DIAGNOSIS — M1990 Unspecified osteoarthritis, unspecified site: Secondary | ICD-10-CM | POA: Diagnosis not present

## 2015-07-20 DIAGNOSIS — Z471 Aftercare following joint replacement surgery: Secondary | ICD-10-CM | POA: Diagnosis not present

## 2015-07-20 DIAGNOSIS — E669 Obesity, unspecified: Secondary | ICD-10-CM | POA: Diagnosis not present

## 2015-07-20 DIAGNOSIS — G609 Hereditary and idiopathic neuropathy, unspecified: Secondary | ICD-10-CM | POA: Diagnosis not present

## 2015-07-20 DIAGNOSIS — M14671 Charcot's joint, right ankle and foot: Secondary | ICD-10-CM | POA: Diagnosis not present

## 2015-07-23 DIAGNOSIS — G609 Hereditary and idiopathic neuropathy, unspecified: Secondary | ICD-10-CM | POA: Diagnosis not present

## 2015-07-23 DIAGNOSIS — I10 Essential (primary) hypertension: Secondary | ICD-10-CM | POA: Diagnosis not present

## 2015-07-23 DIAGNOSIS — Z471 Aftercare following joint replacement surgery: Secondary | ICD-10-CM | POA: Diagnosis not present

## 2015-07-23 DIAGNOSIS — M14671 Charcot's joint, right ankle and foot: Secondary | ICD-10-CM | POA: Diagnosis not present

## 2015-07-23 DIAGNOSIS — M1990 Unspecified osteoarthritis, unspecified site: Secondary | ICD-10-CM | POA: Diagnosis not present

## 2015-07-23 DIAGNOSIS — E669 Obesity, unspecified: Secondary | ICD-10-CM | POA: Diagnosis not present

## 2015-07-25 DIAGNOSIS — M14671 Charcot's joint, right ankle and foot: Secondary | ICD-10-CM | POA: Diagnosis not present

## 2015-07-25 DIAGNOSIS — I10 Essential (primary) hypertension: Secondary | ICD-10-CM | POA: Diagnosis not present

## 2015-07-25 DIAGNOSIS — G609 Hereditary and idiopathic neuropathy, unspecified: Secondary | ICD-10-CM | POA: Diagnosis not present

## 2015-07-25 DIAGNOSIS — M1990 Unspecified osteoarthritis, unspecified site: Secondary | ICD-10-CM | POA: Diagnosis not present

## 2015-07-25 DIAGNOSIS — E669 Obesity, unspecified: Secondary | ICD-10-CM | POA: Diagnosis not present

## 2015-07-25 DIAGNOSIS — Z471 Aftercare following joint replacement surgery: Secondary | ICD-10-CM | POA: Diagnosis not present

## 2015-07-26 DIAGNOSIS — M25552 Pain in left hip: Secondary | ICD-10-CM | POA: Diagnosis not present

## 2015-07-26 DIAGNOSIS — M1612 Unilateral primary osteoarthritis, left hip: Secondary | ICD-10-CM | POA: Diagnosis not present

## 2015-07-27 ENCOUNTER — Other Ambulatory Visit: Payer: Self-pay | Admitting: Internal Medicine

## 2015-07-31 DIAGNOSIS — M1990 Unspecified osteoarthritis, unspecified site: Secondary | ICD-10-CM | POA: Diagnosis not present

## 2015-07-31 DIAGNOSIS — Z471 Aftercare following joint replacement surgery: Secondary | ICD-10-CM | POA: Diagnosis not present

## 2015-07-31 DIAGNOSIS — M14671 Charcot's joint, right ankle and foot: Secondary | ICD-10-CM | POA: Diagnosis not present

## 2015-07-31 DIAGNOSIS — G609 Hereditary and idiopathic neuropathy, unspecified: Secondary | ICD-10-CM | POA: Diagnosis not present

## 2015-07-31 DIAGNOSIS — E669 Obesity, unspecified: Secondary | ICD-10-CM | POA: Diagnosis not present

## 2015-07-31 DIAGNOSIS — I10 Essential (primary) hypertension: Secondary | ICD-10-CM | POA: Diagnosis not present

## 2015-08-02 DIAGNOSIS — M1990 Unspecified osteoarthritis, unspecified site: Secondary | ICD-10-CM | POA: Diagnosis not present

## 2015-08-02 DIAGNOSIS — I10 Essential (primary) hypertension: Secondary | ICD-10-CM | POA: Diagnosis not present

## 2015-08-02 DIAGNOSIS — E669 Obesity, unspecified: Secondary | ICD-10-CM | POA: Diagnosis not present

## 2015-08-02 DIAGNOSIS — Z471 Aftercare following joint replacement surgery: Secondary | ICD-10-CM | POA: Diagnosis not present

## 2015-08-02 DIAGNOSIS — G609 Hereditary and idiopathic neuropathy, unspecified: Secondary | ICD-10-CM | POA: Diagnosis not present

## 2015-08-02 DIAGNOSIS — M14671 Charcot's joint, right ankle and foot: Secondary | ICD-10-CM | POA: Diagnosis not present

## 2015-08-07 DIAGNOSIS — M14671 Charcot's joint, right ankle and foot: Secondary | ICD-10-CM | POA: Diagnosis not present

## 2015-08-07 DIAGNOSIS — Z471 Aftercare following joint replacement surgery: Secondary | ICD-10-CM | POA: Diagnosis not present

## 2015-08-07 DIAGNOSIS — M1990 Unspecified osteoarthritis, unspecified site: Secondary | ICD-10-CM | POA: Diagnosis not present

## 2015-08-07 DIAGNOSIS — G609 Hereditary and idiopathic neuropathy, unspecified: Secondary | ICD-10-CM | POA: Diagnosis not present

## 2015-08-07 DIAGNOSIS — I10 Essential (primary) hypertension: Secondary | ICD-10-CM | POA: Diagnosis not present

## 2015-08-07 DIAGNOSIS — E669 Obesity, unspecified: Secondary | ICD-10-CM | POA: Diagnosis not present

## 2015-08-09 DIAGNOSIS — I10 Essential (primary) hypertension: Secondary | ICD-10-CM | POA: Diagnosis not present

## 2015-08-09 DIAGNOSIS — E669 Obesity, unspecified: Secondary | ICD-10-CM | POA: Diagnosis not present

## 2015-08-09 DIAGNOSIS — G609 Hereditary and idiopathic neuropathy, unspecified: Secondary | ICD-10-CM | POA: Diagnosis not present

## 2015-08-09 DIAGNOSIS — Z471 Aftercare following joint replacement surgery: Secondary | ICD-10-CM | POA: Diagnosis not present

## 2015-08-09 DIAGNOSIS — M1990 Unspecified osteoarthritis, unspecified site: Secondary | ICD-10-CM | POA: Diagnosis not present

## 2015-08-09 DIAGNOSIS — M14671 Charcot's joint, right ankle and foot: Secondary | ICD-10-CM | POA: Diagnosis not present

## 2015-08-16 ENCOUNTER — Encounter: Payer: Self-pay | Admitting: Internal Medicine

## 2015-08-16 ENCOUNTER — Ambulatory Visit (INDEPENDENT_AMBULATORY_CARE_PROVIDER_SITE_OTHER): Payer: Medicare Other | Admitting: Internal Medicine

## 2015-08-16 VITALS — HR 68 | Temp 97.5°F | Resp 18 | Ht 65.0 in | Wt 187.5 lb

## 2015-08-16 DIAGNOSIS — Z1331 Encounter for screening for depression: Secondary | ICD-10-CM

## 2015-08-16 DIAGNOSIS — R609 Edema, unspecified: Secondary | ICD-10-CM

## 2015-08-16 DIAGNOSIS — R7309 Other abnormal glucose: Secondary | ICD-10-CM | POA: Diagnosis not present

## 2015-08-16 DIAGNOSIS — Z6831 Body mass index (BMI) 31.0-31.9, adult: Secondary | ICD-10-CM | POA: Diagnosis not present

## 2015-08-16 DIAGNOSIS — Z0001 Encounter for general adult medical examination with abnormal findings: Secondary | ICD-10-CM

## 2015-08-16 DIAGNOSIS — R6889 Other general symptoms and signs: Secondary | ICD-10-CM | POA: Diagnosis not present

## 2015-08-16 DIAGNOSIS — E782 Mixed hyperlipidemia: Secondary | ICD-10-CM

## 2015-08-16 DIAGNOSIS — Z1389 Encounter for screening for other disorder: Secondary | ICD-10-CM | POA: Diagnosis not present

## 2015-08-16 DIAGNOSIS — I10 Essential (primary) hypertension: Secondary | ICD-10-CM | POA: Diagnosis not present

## 2015-08-16 DIAGNOSIS — R296 Repeated falls: Secondary | ICD-10-CM | POA: Diagnosis not present

## 2015-08-16 DIAGNOSIS — E559 Vitamin D deficiency, unspecified: Secondary | ICD-10-CM | POA: Diagnosis not present

## 2015-08-16 DIAGNOSIS — Z9181 History of falling: Secondary | ICD-10-CM

## 2015-08-16 DIAGNOSIS — Z79899 Other long term (current) drug therapy: Secondary | ICD-10-CM | POA: Diagnosis not present

## 2015-08-16 LAB — CBC WITH DIFFERENTIAL/PLATELET
Basophils Absolute: 0.1 10*3/uL (ref 0.0–0.1)
Basophils Relative: 1 % (ref 0–1)
EOS ABS: 0.5 10*3/uL (ref 0.0–0.7)
Eosinophils Relative: 5 % (ref 0–5)
HCT: 34.5 % — ABNORMAL LOW (ref 36.0–46.0)
HEMOGLOBIN: 10.7 g/dL — AB (ref 12.0–15.0)
LYMPHS ABS: 2.8 10*3/uL (ref 0.7–4.0)
Lymphocytes Relative: 30 % (ref 12–46)
MCH: 27.9 pg (ref 26.0–34.0)
MCHC: 31 g/dL (ref 30.0–36.0)
MCV: 89.8 fL (ref 78.0–100.0)
MONO ABS: 0.8 10*3/uL (ref 0.1–1.0)
MONOS PCT: 9 % (ref 3–12)
MPV: 9 fL (ref 8.6–12.4)
NEUTROS PCT: 55 % (ref 43–77)
Neutro Abs: 5.1 10*3/uL (ref 1.7–7.7)
Platelets: 495 10*3/uL — ABNORMAL HIGH (ref 150–400)
RBC: 3.84 MIL/uL — ABNORMAL LOW (ref 3.87–5.11)
RDW: 15.8 % — ABNORMAL HIGH (ref 11.5–15.5)
WBC: 9.2 10*3/uL (ref 4.0–10.5)

## 2015-08-16 LAB — HEPATIC FUNCTION PANEL
ALBUMIN: 3.6 g/dL (ref 3.6–5.1)
ALT: 6 U/L (ref 6–29)
AST: 14 U/L (ref 10–35)
Alkaline Phosphatase: 93 U/L (ref 33–130)
Bilirubin, Direct: 0.1 mg/dL (ref <=0.2)
Indirect Bilirubin: 0.3 mg/dL (ref 0.2–1.2)
TOTAL PROTEIN: 6.6 g/dL (ref 6.1–8.1)
Total Bilirubin: 0.4 mg/dL (ref 0.2–1.2)

## 2015-08-16 LAB — BASIC METABOLIC PANEL WITH GFR
BUN: 13 mg/dL (ref 7–25)
CO2: 26 mmol/L (ref 20–31)
CREATININE: 0.95 mg/dL (ref 0.50–0.99)
Calcium: 9.3 mg/dL (ref 8.6–10.4)
Chloride: 96 mmol/L — ABNORMAL LOW (ref 98–110)
GFR, Est African American: 72 mL/min (ref >=60)
GFR, Est Non African American: 63 mL/min (ref >=60)
Glucose, Bld: 91 mg/dL (ref 65–99)
Potassium: 4.4 mmol/L (ref 3.5–5.3)
Sodium: 135 mmol/L (ref 135–146)

## 2015-08-16 LAB — LIPID PANEL
Cholesterol: 176 mg/dL (ref 125–200)
HDL: 51 mg/dL (ref >=46)
LDL Cholesterol: 88 mg/dL (ref <130)
Total CHOL/HDL Ratio: 3.5 Ratio (ref <=5.0)
Triglycerides: 186 mg/dL — ABNORMAL HIGH (ref <150)
VLDL: 37 mg/dL — ABNORMAL HIGH (ref <30)

## 2015-08-16 LAB — TSH: TSH: 0.09 u[IU]/mL — AB (ref 0.350–4.500)

## 2015-08-16 LAB — HEMOGLOBIN A1C
Hgb A1c MFr Bld: 5.2 % (ref <5.7)
MEAN PLASMA GLUCOSE: 103 mg/dL (ref <117)

## 2015-08-16 LAB — MAGNESIUM: MAGNESIUM: 1.8 mg/dL (ref 1.5–2.5)

## 2015-08-16 NOTE — Patient Instructions (Signed)

## 2015-08-16 NOTE — Progress Notes (Signed)
Patient ID: Tiffany [name], female   DOB: 24-May-1946, 69 y.o.   MRN: [id number]  MEDICARE ANNUAL WELLNESS VISIT AND OV  Assessment:   1. Essential hypertension  - TSH  2. Hyperlipidemia  - Lipid panel  3. Abnormal glucose  - Hemoglobin A1c - Insulin, random  4. Vitamin D deficiency  - Vit D  25 hydroxy   5. Peripheral edema   6. Medication management  - CBC with Differential/Platelet - BASIC METABOLIC PANEL WITH GFR - Hepatic function panel - Magnesium  7. BMI 31.0-31.9,adult   8. At moderate risk for fall   9. Depression screen   10. s/p resection of a LUL Adenocarcinoma   Plan:   During the course of the visit the patient was educated and counseled about appropriate screening and preventive services including:    Pneumococcal vaccine   Influenza vaccine  Td vaccine  Screening electrocardiogram  Bone densitometry screening  Colorectal cancer screening  Diabetes screening  Glaucoma screening  Nutrition counseling   Advanced directives: requested  Screening recommendations, referrals: Vaccinations:  Immunization History  Administered Date(s) Administered  . Influenza Split 11/22/2013  . Pneumococcal Polysaccharide-23 10/01/1999  . Tdap 05/29/2008  . Zoster 10/09/2011   Tdap vaccine 05/29/2008 Influenza vaccine 11/22/2013 Pneumococcal vaccine 10/01/1999 Prevnar vaccine undecided Shingles vaccine 10/09/11 Hep B vaccine not indicated  Nutrition assessed and recommended  Colonoscopy 2008 Recommended yearly ophthalmology/optometry visit for glaucoma screening and checkup Recommended yearly dental visit for hygiene and checkup Advanced directives - no - Given Forms today  Conditions/risks identified: BMI: Discussed weight loss, diet, and increase physical activity.  Increase physical activity: AHA recommends 150 minutes of physical activity a week.  Medications reviewed preDiabetes is at goal, ACE/ARB therapy: Yes. Urinary  Incontinence is not an issue: discussed non pharmacology and pharmacology options.  Fall risk: moderate- discussed PT, home fall assessment, medications.   Subjective:    Tiffany [name]  presents for [employer] Visit and OV.  Date of last medicare wellness visit was 04/04/2014. This very nice 69 yo MWF is seen in quarterly  follow up with Hypertension, Hyperlipidemia, Pre-Diabetes and Vitamin D Deficiency.      In May of this year patient underwent a VATS by Dr Dorris Fetch for a LUL primary Adenocarcinoma with clean resection and no signs of invasion or spread and not felt in need of salvage chemoRadiation. More resently she Underwent a Left THP by Dr Magnus Ivan with good results in alleviating pain and patient is still on a walker to avoid falls .       Patient is treated for HTN since 1998 & BP has been controlled at home. Today's was 94/62. Patient has had no complaints of any cardiac type chest pain, palpitations, dyspnea/orthopnea/PND, dizziness, claudication, or dependent edema.     Hyperlipidemia is controlled with diet & meds. Patient denies myalgias or other med SE's. Last Lipids were at goal - Cholesterol 198; HDL 89; LDL 83; Triglycerides 128 on 01/25/2015.     Also, the patient has history of PreDiabetes and has had no symptoms of reactive hypoglycemia, diabetic polys, paresthesias or visual blurring.  Last A1c was 5.4% on 01/25/2015.      Further, the patient also has history of Vitamin D Deficiency of 41 on treatment in 2009 and supplements vitamin D without any suspected side-effects. Last vitamin D was  72 on 01/25/2015.  Names of Other Physician/Practitioners you currently use: 1. [facility] here for primary care 2. Dr  Eckard, OD, eye doctor, last visit 2014 3. Dr Conard Novak, DDS, dentist, last visit 2014  Patient Care Team: Lucky Cowboy, MD as PCP - General (Internal Medicine) Kathryne Hitch, MD as Consulting Physician  (Orthopedic Surgery) Loreli Slot, MD as Consulting Physician (Cardiothoracic Surgery) Leslye Peer, MD as Consulting Physician (Pulmonary Disease) Cammy Copa, MD as Consulting Physician (Orthopedic Surgery) Hilarie Fredrickson, MD as Consulting Physician (Gastroenterology)  Medication Review: Medication Sig  . atenolol (TENORMIN) 100 MG tablet  TAKE 1/2 TABLET BY MOUTH DAILY FOR BLOOD PRESSURE)  . buPROPion  SR 150 MG 12 hr tablet TAKE 1 TABLET BY MOUTH TWICE DAILY  . VITAMIN D  Take 5,000 Units by mouth 2 (two) times daily.   . diphenhydrAMINE 25 MG tablet Take 25 mg by mouth every 6 (six) hours as needed for allergies.   Marland Kitchen enalapril  20 MG tablet TAKE 1 TABLET BY MOUTH DAILY FOR HIGH BLOOD PRESSURE  . furosemide  40 MG tablet TAKE 1 TABLET BY MOUTH EVERY MORNING  . Multiple Vitamins-Minerals  Take 1 tablet by mouth at bedtime.   Marland Kitchen ROXICET 5-325  Take 1-2 tablets by mouth every 4 (four) hours as needed for severe pain.  . pantoprazole  40 MG tablet Take 1 tablet (40 mg total) by mouth 2 (two) times daily.  . potassium chloride SA / K-DUR 20 MEQ  Take 1 tablet (20 mEq total) by mouth daily.  . pravastatin  40 MG tablet TAKE 1 TABLET BY MOUTH EVERY NIGHT AT BEDTIME FOR CHOLESTEROL   Allergies  Allergen Reactions  . Other Other (See Comments)    Stool softeners caused stomach bloating, nausea, vomiting bleeding ulcer and blood transfusion   Current Problems (verified) Patient Active Problem List   Diagnosis Date Noted  . Osteoarthritis of left hip 07/13/2015  . 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 Duodenal ulcer with hemorrhage   . Acute esophagitis   . Gastric ulcer due to nonsteroidal antiinflammatory drug (NSAID) therapy   . Lung cancer 05/09/2015  . Peripheral edema 05/01/2015  . Abnormal glucose 07/18/2014  . Vitamin D deficiency 07/18/2014  . Medication management 07/18/2014  . Hyperlipidemia 04/04/2014  . Charcot's Right foot 06/15/2013  .  Hypertension    Screening Tests Health Maintenance  Topic Date Due  . Hepatitis C Screening  [birth year]  . TETANUS/TDAP  11/21/1967  . PNA vac Low Risk Adult (1 of 2 - PCV13) 11/20/2013  . INFLUENZA VACCINE  07/30/2015  . MAMMOGRAM  10/31/2016  . COLONOSCOPY  12/29/2016  . DEXA SCAN  Completed  . ZOSTAVAX  Completed   Immunization History  Administered Date(s) Administered  . Influenza Split 11/22/2013  . Pneumococcal Polysaccharide-23 10/01/1999  . Tdap 05/29/2008  . Zoster 10/09/2011   Preventative care: Last colonoscopy: 2008  Past Medical History  Diagnosis Date  . High blood pressure   . Gait difficulty     problems with balance  . Hyperlipidemia   . Pre-diabetes   . Vitamin D deficiency   . Obesity (BMI 35.0-39.9 without comorbidity)   . Neuropathy   . Hyperthyroidism mild  . Arthritis   . History of blood transfusion     05-14-15  . Anemia    Past Surgical History  Procedure Laterality Date  . Vaginal hysterectomy    . Cervical disc arthroplasty    . Rotator cuff repair Right   . Spinal fusion    . Knee replaced  right knee x 2  . Video bronchoscopy with endobronchial navigation N/A 04/18/2015    Procedure: VIDEO BRONCHOSCOPY WITH ENDOBRONCHIAL NAVIGATION;  Surgeon: Leslye Peer, MD;  Location: [facility] OR;  Service: Thoracic;  Laterality: N/A;  . Video bronchoscopy with endobronchial ultrasound N/A 04/18/2015    Procedure: VIDEO BRONCHOSCOPY WITH ENDOBRONCHIAL ULTRASOUND;  Surgeon: Leslye Peer, MD;  Location: [facility] OR;  Service: Thoracic;  Laterality: N/A;  . Video assisted thoracoscopy (vats)/wedge resection Left 05/09/2015    Procedure: LEFT VIDEO ASSISTED THORACOSCOPY (VATS) WITH LEFT LUNG UPPER LOBE WEDGE RESECTION;  Surgeon: Loreli Slot, MD;  Location: [facility] OR;  Service: Thoracic;  Laterality: Left;  . Lobectomy Left 05/09/2015    Procedure: LEFT LUNG UPPER LOBECTOMY;  Surgeon: Loreli Slot, MD;  Location: [facility] OR;  Service: Thoracic;   Laterality: Left;  . Cryo intercostal nerve block Left 05/09/2015    Procedure: CRYO INTERCOSTAL NERVE BLOCK;  Surgeon: Loreli Slot, MD;  Location: [facility] OR;  Service: Thoracic;  Laterality: Left;  . Lymph node dissection Left 05/09/2015    Procedure: LYMPH NODE DISSECTION;  Surgeon: Loreli Slot, MD;  Location[facility] OR;  Service: Thoracic;  Laterality: Left;  . Esophagogastroduodenoscopy N/A 05/16/2015    Procedure: ESOPHAGOGASTRODUODENOSCOPY (EGD);  Surgeon: Hilarie Fredrickson, MD;  Location: [facility] ENDOSCOPY;  Service: Endoscopy;  Laterality: N/A;  possible ablation of a bleeding lesion  . Tonsillectomy  age 69  . Total hip arthroplasty Left 07/13/2015    Procedure: LEFT TOTAL HIP ARTHROPLASTY ANTERIOR APPROACH;  Surgeon: Kathryne Hitch, MD;  Location: [facility] ORS;  Service: Orthopedics;  Laterality: Left;   Risk Factors: Tobacco Social History  Substance Use Topics  . Smoking status: Former Smoker -- 1.00 packs/day for 30 years    Types: Cigarettes    Quit date: 05/08/2015  . Smokeless tobacco: Never Used  . Alcohol Use: 0.6 oz/week    1 Glasses of wine per week     Comment: 1 glass wine  daily   She does not smoke.  Patient is a former smoker. Are there smokers in your home (other than you)?  No Alcohol Current alcohol use: glass of wine with dinner  Caffeine Current caffeine use: coffee 1-2 cups /day  Exercise Current exercise: walking  Nutrition/Diet Current diet: in general, a "healthy" diet    Cardiac risk factors: advanced age (older than 39 for men, 14 for women), dyslipidemia, hypertension, obesity (BMI >= 30 kg/m2), sedentary lifestyle and smoking/ tobacco exposure.  Depression Screen (Note: if answer to either of the following is "Yes", a more complete depression screening is indicated)   Q1: Over the past two weeks, have you felt down, depressed or hopeless? No  Q2: Over the past two weeks, have you felt little interest or pleasure in doing things? No  Have  you lost interest or pleasure in daily life? No  Do you often feel hopeless? No  Do you cry easily over simple problems? No  Activities of Daily Living In your present state of health, do you have any difficulty performing the following activities?:  Driving? No Managing money?  No Feeding yourself? No Getting from bed to chair? No Climbing a flight of stairs? No Preparing food and eating?: No Bathing or showering? No Getting dressed: No Getting to the toilet? No Using the toilet:No Moving around from place to place: No In the past year have you fallen or had a near fall?:No   Are you sexually active?  No  Do  you have more than one partner?  No  Vision Difficulties: No  Hearing Difficulties: No Do you often ask people to speak up or repeat themselves? No Do you experience ringing or noises in your ears? No Do you have difficulty understanding soft or whispered voices? Sometimes.  Cognition  Do you feel that you have a problem with memory?No  Do you often misplace items? No  Do you feel safe at home?  Yes  Advanced directives Does patient have a Health Care Power of Attorney? No- has forms Does patient have a Living Will? No - has forms  ROS: Constitutional: Denies fever, chills, weight loss/gain, headaches, insomnia, fatigue, night sweats, and change in appetite. Eyes: Denies redness, blurred vision, diplopia, discharge, itchy, watery eyes.  ENT: Denies discharge, congestion, post nasal drip, epistaxis, sore throat, earache, hearing loss, dental pain, Tinnitus, Vertigo, Sinus pain, snoring.  Cardio: Denies chest pain, palpitations, irregular heartbeat, syncope, dyspnea, diaphoresis, orthopnea, PND, claudication, edema Respiratory: denies cough, dyspnea, DOE, pleurisy, hoarseness, laryngitis, wheezing.  Gastrointestinal: Denies dysphagia, heartburn, reflux, water brash, pain, cramps, nausea, vomiting, bloating, diarrhea, constipation, hematemesis, melena, hematochezia,  jaundice, hemorrhoids Genitourinary: Denies dysuria, frequency, urgency, nocturia, hesitancy, discharge, hematuria, flank pain Breast: Breast lumps, nipple discharge, bleeding.  Musculoskeletal: Denies arthralgia, myalgia, stiffness, Jt. Swelling, pain, limp, and strain/sprain. Denies falls. Skin: Denies puritis, rash, hives, warts, acne, eczema, changing in skin lesion Neuro: No weakness, tremor, incoordination, spasms, paresthesia, pain Psychiatric: Denies confusion, memory loss, sensory loss. Denies Depression. Endocrine: Denies change in weight, skin, hair change, nocturia, and paresthesia, diabetic polys, visual blurring, hyper / hypo glycemic episodes.  Heme/Lymph: No excessive bleeding, bruising, enlarged lymph nodes  Objective:     Pulse 68  Temp(Src) 97.5 F (36.4 C)  Resp 18  Ht 5\' 5"  (1.651 m)  Wt 187 lb 8 oz (85.049 kg)  BMI 31.20 kg/m2  General Appearance: Well nourished, alert, WD/WN, female and in no apparent distress. Eyes: PERRLA, EOMs, conjunctiva no swelling or erythema, normal fundi and vessels. Sinuses: No frontal/maxillary tenderness ENT/Mouth: EACs patent / TMs  nl. Nares clear without erythema, swelling, mucoid exudates. Oral hygiene is good. No erythema, swelling, or exudate. Tongue normal, non-obstructing. Tonsils not swollen or erythematous. Hearing normal.  Neck: Supple, thyroid normal. No bruits, nodes or JVD. Respiratory: Respiratory effort normal.  BS equal and clear bilateral without rales, rhonci, wheezing or stridor. Cardio: Heart sounds are normal with regular rate and rhythm and no murmurs, rubs or gallops. Peripheral pulses are normal and equal bilaterally without edema. No aortic or femoral bruits. Chest: symmetric with normal excursions and percussion.  Abdomen: Flat, soft  with nl bowel sounds. Nontender, no guarding, rebound, hernias, masses, or organomegaly.  Lymphatics: Non tender without lymphadenopathy.  Musculoskeletal: Full ROM all  peripheral extremities, joint stability, 5/5 strength, and gait stabilized with a walker. Skin: Warm and dry without rashes, lesions, cyanosis, clubbing or  ecchymosis.  Neuro: Cranial nerves intact, reflexes equal bilaterally. Normal muscle tone, no cerebellar symptoms. Sensation intact.  Pysch: Alert and oriented X 3, normal affect, Insight and Judgment appropriate.   Cognitive Testing  Alert? Yes  Normal Appearance?Yes  Oriented to person? Yes  Place? Yes   Time? Yes  Recall of three objects?  Yes  Can perform simple calculations? Yes  Displays appropriate judgment? Yes  Can read the correct time from a watch/clock?Yes  Medicare Attestation I have personally reviewed: The patient's medical and social history Their use of alcohol, tobacco or illicit drugs Their current medications and  supplements The patient's functional ability including ADLs,fall risks, home safety risks, cognitive, and hearing and visual impairment Diet and physical activities Evidence for depression or mood disorders  The patient's weight, height, BMI, and visual acuity have been recorded in the chart.  I have made referrals, counseling, and provided education to the patient based on review of the above and I have provided the patient with a written personalized care plan for preventive services.  Over 40 minutes of exam, counseling, chart review was performed.   Lataysha Vohra DAVID, MD   08/16/2015

## 2015-08-17 LAB — VITAMIN D 25 HYDROXY (VIT D DEFICIENCY, FRACTURES): VIT D 25 HYDROXY: 97 ng/mL (ref 30–100)

## 2015-08-17 LAB — INSULIN, RANDOM: INSULIN: 9.7 u[IU]/mL (ref 2.0–19.6)

## 2015-08-18 ENCOUNTER — Encounter: Payer: Self-pay | Admitting: Internal Medicine

## 2015-08-18 DIAGNOSIS — E0590 Thyrotoxicosis, unspecified without thyrotoxic crisis or storm: Secondary | ICD-10-CM | POA: Insufficient documentation

## 2015-09-09 ENCOUNTER — Other Ambulatory Visit: Payer: Self-pay | Admitting: Physician Assistant

## 2015-09-09 DIAGNOSIS — E782 Mixed hyperlipidemia: Secondary | ICD-10-CM

## 2015-09-10 DIAGNOSIS — M25559 Pain in unspecified hip: Secondary | ICD-10-CM | POA: Diagnosis not present

## 2015-09-10 DIAGNOSIS — Z79899 Other long term (current) drug therapy: Secondary | ICD-10-CM | POA: Diagnosis not present

## 2015-09-10 DIAGNOSIS — G894 Chronic pain syndrome: Secondary | ICD-10-CM | POA: Diagnosis not present

## 2015-09-10 DIAGNOSIS — E669 Obesity, unspecified: Secondary | ICD-10-CM | POA: Diagnosis not present

## 2015-09-10 DIAGNOSIS — M79606 Pain in leg, unspecified: Secondary | ICD-10-CM | POA: Diagnosis not present

## 2015-09-10 DIAGNOSIS — M545 Low back pain: Secondary | ICD-10-CM | POA: Diagnosis not present

## 2015-10-02 ENCOUNTER — Other Ambulatory Visit: Payer: Self-pay | Admitting: Physician Assistant

## 2015-10-08 DIAGNOSIS — M5137 Other intervertebral disc degeneration, lumbosacral region: Secondary | ICD-10-CM | POA: Diagnosis not present

## 2015-10-08 DIAGNOSIS — M545 Low back pain: Secondary | ICD-10-CM | POA: Diagnosis not present

## 2015-10-08 DIAGNOSIS — M47817 Spondylosis without myelopathy or radiculopathy, lumbosacral region: Secondary | ICD-10-CM | POA: Diagnosis not present

## 2015-10-08 DIAGNOSIS — Z79899 Other long term (current) drug therapy: Secondary | ICD-10-CM | POA: Diagnosis not present

## 2015-10-08 DIAGNOSIS — G894 Chronic pain syndrome: Secondary | ICD-10-CM | POA: Diagnosis not present

## 2015-11-04 ENCOUNTER — Other Ambulatory Visit: Payer: Self-pay | Admitting: Internal Medicine

## 2015-11-05 DIAGNOSIS — M47817 Spondylosis without myelopathy or radiculopathy, lumbosacral region: Secondary | ICD-10-CM | POA: Diagnosis not present

## 2015-11-05 DIAGNOSIS — Z79899 Other long term (current) drug therapy: Secondary | ICD-10-CM | POA: Diagnosis not present

## 2015-11-05 DIAGNOSIS — M792 Neuralgia and neuritis, unspecified: Secondary | ICD-10-CM | POA: Diagnosis not present

## 2015-11-05 DIAGNOSIS — M545 Low back pain: Secondary | ICD-10-CM | POA: Diagnosis not present

## 2015-11-05 DIAGNOSIS — M5137 Other intervertebral disc degeneration, lumbosacral region: Secondary | ICD-10-CM | POA: Diagnosis not present

## 2015-11-05 DIAGNOSIS — G894 Chronic pain syndrome: Secondary | ICD-10-CM | POA: Diagnosis not present

## 2015-11-20 ENCOUNTER — Other Ambulatory Visit: Payer: Self-pay | Admitting: Physician Assistant

## 2015-11-20 ENCOUNTER — Other Ambulatory Visit: Payer: Self-pay | Admitting: Internal Medicine

## 2015-11-29 ENCOUNTER — Ambulatory Visit (INDEPENDENT_AMBULATORY_CARE_PROVIDER_SITE_OTHER): Payer: Medicare Other | Admitting: Internal Medicine

## 2015-11-29 ENCOUNTER — Encounter: Payer: Self-pay | Admitting: Internal Medicine

## 2015-11-29 VITALS — BP 110/66 | HR 68 | Temp 98.0°F | Resp 16 | Ht 65.0 in | Wt 197.0 lb

## 2015-11-29 DIAGNOSIS — E782 Mixed hyperlipidemia: Secondary | ICD-10-CM | POA: Diagnosis not present

## 2015-11-29 DIAGNOSIS — Z79899 Other long term (current) drug therapy: Secondary | ICD-10-CM

## 2015-11-29 DIAGNOSIS — R7309 Other abnormal glucose: Secondary | ICD-10-CM

## 2015-11-29 DIAGNOSIS — Z23 Encounter for immunization: Secondary | ICD-10-CM

## 2015-11-29 DIAGNOSIS — E559 Vitamin D deficiency, unspecified: Secondary | ICD-10-CM | POA: Diagnosis not present

## 2015-11-29 DIAGNOSIS — I10 Essential (primary) hypertension: Secondary | ICD-10-CM | POA: Diagnosis not present

## 2015-11-29 DIAGNOSIS — E0590 Thyrotoxicosis, unspecified without thyrotoxic crisis or storm: Secondary | ICD-10-CM | POA: Diagnosis not present

## 2015-11-29 LAB — CBC WITH DIFFERENTIAL/PLATELET
BASOS PCT: 0 % (ref 0–1)
Basophils Absolute: 0 10*3/uL (ref 0.0–0.1)
EOS ABS: 0.1 10*3/uL (ref 0.0–0.7)
Eosinophils Relative: 1 % (ref 0–5)
HCT: 34.6 % — ABNORMAL LOW (ref 36.0–46.0)
Hemoglobin: 11.7 g/dL — ABNORMAL LOW (ref 12.0–15.0)
Lymphocytes Relative: 25 % (ref 12–46)
Lymphs Abs: 2.3 10*3/uL (ref 0.7–4.0)
MCH: 31.5 pg (ref 26.0–34.0)
MCHC: 33.8 g/dL (ref 30.0–36.0)
MCV: 93 fL (ref 78.0–100.0)
MONOS PCT: 8 % (ref 3–12)
MPV: 8.5 fL — AB (ref 8.6–12.4)
Monocytes Absolute: 0.7 10*3/uL (ref 0.1–1.0)
NEUTROS ABS: 5.9 10*3/uL (ref 1.7–7.7)
NEUTROS PCT: 66 % (ref 43–77)
PLATELETS: 328 10*3/uL (ref 150–400)
RBC: 3.72 MIL/uL — AB (ref 3.87–5.11)
RDW: 16.6 % — ABNORMAL HIGH (ref 11.5–15.5)
WBC: 9 10*3/uL (ref 4.0–10.5)

## 2015-11-29 LAB — BASIC METABOLIC PANEL WITH GFR
BUN: 28 mg/dL — ABNORMAL HIGH (ref 7–25)
CALCIUM: 9.1 mg/dL (ref 8.6–10.4)
CHLORIDE: 95 mmol/L — AB (ref 98–110)
CO2: 25 mmol/L (ref 20–31)
CREATININE: 0.98 mg/dL (ref 0.50–0.99)
GFR, EST NON AFRICAN AMERICAN: 60 mL/min (ref >=60)
GFR, Est African American: 69 mL/min (ref >=60)
Glucose, Bld: 87 mg/dL (ref 65–99)
Potassium: 4.5 mmol/L (ref 3.5–5.3)
SODIUM: 132 mmol/L — AB (ref 135–146)

## 2015-11-29 LAB — HEPATIC FUNCTION PANEL
ALT: 14 U/L (ref 6–29)
AST: 18 U/L (ref 10–35)
Albumin: 3.7 g/dL (ref 3.6–5.1)
Alkaline Phosphatase: 87 U/L (ref 33–130)
BILIRUBIN DIRECT: 0.1 mg/dL (ref <=0.2)
BILIRUBIN INDIRECT: 0.3 mg/dL (ref 0.2–1.2)
BILIRUBIN TOTAL: 0.4 mg/dL (ref 0.2–1.2)
Total Protein: 6.6 g/dL (ref 6.1–8.1)

## 2015-11-29 LAB — TSH: TSH: 0.134 u[IU]/mL — AB (ref 0.350–4.500)

## 2015-11-29 NOTE — Progress Notes (Signed)
Patient ID: BRAYLIE BADAMI, female   DOB: 11/27/1946, 69 y.o.   MRN: [id number]  Assessment and Plan:  Hypertension:  -Continue medication,  -monitor blood pressure at home.  -Continue DASH diet.   -Reminder to go to the ER if any CP, SOB, nausea, dizziness, severe HA, changes vision/speech, left arm numbness and tingling, and jaw pain.  Cholesterol: -Continue diet and exercise.  -Check cholesterol.   Pre-diabetes: -Continue diet and exercise.  -Check A1C  Vitamin D Def: -check level -continue medications.   Lung Cancer -s/p lobectomy left sided -released from cardiothoracic surgery care  Easy Bruising -cbc -change asa to qod  Continue diet and meds as discussed. Further disposition pending results of labs.  HPI 69 y.o. female  presents for 3 month follow up with hypertension, hyperlipidemia, prediabetes and vitamin D.   Her blood pressure has been controlled at home, today their BP is BP: 110/66 mmHg.   She does not workout. She denies chest pain, shortness of breath, dizziness.  She is limited secondary to her orthopedic and lung cancer conditions.  She reports that BP at home is running 120/70.   She is on cholesterol medication and denies myalgias. Her cholesterol is at goal. The cholesterol last visit was:   Lab Results  Component Value Date   CHOL 176 08/16/2015   HDL 51 08/16/2015   LDLCALC 88 08/16/2015   TRIG 186* 08/16/2015   CHOLHDL 3.5 08/16/2015     She has been working on diet and exercise for prediabetes, and denies foot ulcerations, hyperglycemia, hypoglycemia , increased appetite, nausea, paresthesia of the feet, polydipsia, polyuria, visual disturbances, vomiting and weight loss. Last A1C in the office was:  Lab Results  Component Value Date   HGBA1C 5.2 08/16/2015    Patient is on Vitamin D supplement.  Lab Results  Component Value Date   VD25OH 6 08/16/2015     She does report that she has had a lot of easy bruising.  She also has history  of bleeding ulcers and takes asa every day.  Current Medications:  Current Outpatient Prescriptions on File Prior to Visit  Medication Sig Dispense Refill  . atenolol (TENORMIN) 100 MG tablet TAKE 1 TABLET BY MOUTH DAILY FOR BLOOD PRESSURE 90 tablet 1  . buPROPion (WELLBUTRIN SR) 150 MG 12 hr tablet Take 1 tablet by mouth twice daily 60 tablet 0  . Cholecalciferol (VITAMIN D PO) Take 5,000 Units by mouth 2 (two) times daily.     . diphenhydrAMINE (BENADRYL) 25 MG tablet Take 25 mg by mouth every 6 (six) hours as needed for allergies.     Marland Kitchen enalapril (VASOTEC) 20 MG tablet TAKE 1 TABLET BY MOUTH DAILY FOR HIGH BLOOD PRESSURE 90 tablet 1  . furosemide (LASIX) 40 MG tablet TAKE 1 TABLET BY MOUTH EVERY MORNING-NEEDS OFFICE VISIT WITHIN NEXT THREE MONTHS 90 tablet 1  . Multiple Vitamins-Minerals (MULTIVITAMIN PO) Take 1 tablet by mouth at bedtime.     Marland Kitchen oxyCODONE-acetaminophen (ROXICET) 5-325 MG per tablet Take 1-2 tablets by mouth every 4 (four) hours as needed for severe pain. 60 tablet 0  . pantoprazole (PROTONIX) 40 MG tablet Take 1 tablet (40 mg total) by mouth 2 (two) times daily. 60 tablet 1  . pravastatin (PRAVACHOL) 40 MG tablet TAKE 1 TABLET BY MOUTH EVERY NIGHT AT BEDTIME FOR CHOLESTEROL 90 tablet 0   No current facility-administered medications on file prior to visit.    Medical History:  Past Medical History  Diagnosis Date  .  High blood pressure   . Gait difficulty     problems with balance  . Hyperlipidemia   . Pre-diabetes   . Vitamin D deficiency   . Obesity (BMI 35.0-39.9 without comorbidity) ([place])   . Neuropathy ([place])   . Hyperthyroidism mild  . Arthritis   . History of blood transfusion     05-14-15  . Anemia     Allergies:  Allergies  Allergen Reactions  . Other Other (See Comments)    Stool softeners caused stomach bloating, nausea, vomiting bleeding ulcer and blood transfusion     Review of Systems:  Review of Systems  Constitutional: Negative for fever.   HENT: Negative for congestion, ear pain and sore throat.   Eyes: Negative.   Respiratory: Negative for cough, shortness of breath and wheezing.   Cardiovascular: Positive for leg swelling. Negative for chest pain and palpitations.  Gastrointestinal: Negative for heartburn, diarrhea, constipation, blood in stool and melena.  Genitourinary: Negative.   Skin: Negative.   Neurological: Negative for dizziness, sensory change, loss of consciousness and headaches.  Psychiatric/Behavioral: Negative for depression. The patient is not nervous/anxious and does not have insomnia.     Family history- Review and unchanged  Social history- Review and unchanged  Physical Exam: BP 110/66 mmHg  Pulse 68  Temp(Src) 98 F (36.7 C) (Temporal)  Resp 16  Ht '5\' 5"'$  (1.651 m)  Wt 197 lb (89.359 kg)  BMI 32.78 kg/m2 Wt Readings from Last 3 Encounters:  11/29/15 197 lb (89.359 kg)  08/16/15 187 lb 8 oz (85.049 kg)  07/13/15 197 lb 5 oz (89.5 kg)    General Appearance: Well nourished well developed, in no apparent distress. Eyes: PERRLA, EOMs, conjunctiva no swelling or erythema ENT/Mouth: Ear canals normal without obstruction, swelling, erythma, discharge.  TMs normal bilaterally.  Oropharynx moist, clear, without exudate, or postoropharyngeal swelling. Neck: Supple, thyroid normal,no cervical adenopathy  Respiratory: Respiratory effort normal, Breath sounds clear A&P without rhonchi, wheeze, or rale.  No retractions, no accessory usage. Cardio: RRR with no RGs. 2/6 murmur Brisk peripheral pulses without edema.  Abdomen: Soft, + BS,  Non tender, no guarding, rebound, hernias, masses. Musculoskeletal: Full ROM, 5/5 strength, Normal gait Skin: Warm, dry without rashes, lesions, ecchymosis.  Neuro: Awake and oriented X 3, Cranial nerves intact. Normal muscle tone, no cerebellar symptoms. Psych: Normal affect, Insight and Judgment appropriate.    Starlyn Skeans, PA-C 2:31 PM [facility]

## 2015-12-03 DIAGNOSIS — M47817 Spondylosis without myelopathy or radiculopathy, lumbosacral region: Secondary | ICD-10-CM | POA: Diagnosis not present

## 2015-12-03 DIAGNOSIS — Z79899 Other long term (current) drug therapy: Secondary | ICD-10-CM | POA: Diagnosis not present

## 2015-12-03 DIAGNOSIS — G894 Chronic pain syndrome: Secondary | ICD-10-CM | POA: Diagnosis not present

## 2015-12-03 DIAGNOSIS — M5137 Other intervertebral disc degeneration, lumbosacral region: Secondary | ICD-10-CM | POA: Diagnosis not present

## 2016-01-20 ENCOUNTER — Other Ambulatory Visit: Payer: Self-pay | Admitting: Internal Medicine

## 2016-01-20 ENCOUNTER — Other Ambulatory Visit: Payer: Self-pay | Admitting: Physician Assistant

## 2016-01-28 DIAGNOSIS — M47817 Spondylosis without myelopathy or radiculopathy, lumbosacral region: Secondary | ICD-10-CM | POA: Diagnosis not present

## 2016-01-28 DIAGNOSIS — Z79899 Other long term (current) drug therapy: Secondary | ICD-10-CM | POA: Diagnosis not present

## 2016-01-28 DIAGNOSIS — G894 Chronic pain syndrome: Secondary | ICD-10-CM | POA: Diagnosis not present

## 2016-01-28 DIAGNOSIS — M5137 Other intervertebral disc degeneration, lumbosacral region: Secondary | ICD-10-CM | POA: Diagnosis not present

## 2016-02-05 DIAGNOSIS — M47817 Spondylosis without myelopathy or radiculopathy, lumbosacral region: Secondary | ICD-10-CM | POA: Diagnosis not present

## 2016-02-25 DIAGNOSIS — M792 Neuralgia and neuritis, unspecified: Secondary | ICD-10-CM | POA: Diagnosis not present

## 2016-02-25 DIAGNOSIS — M5137 Other intervertebral disc degeneration, lumbosacral region: Secondary | ICD-10-CM | POA: Diagnosis not present

## 2016-02-25 DIAGNOSIS — G894 Chronic pain syndrome: Secondary | ICD-10-CM | POA: Diagnosis not present

## 2016-02-25 DIAGNOSIS — M47817 Spondylosis without myelopathy or radiculopathy, lumbosacral region: Secondary | ICD-10-CM | POA: Diagnosis not present

## 2016-02-25 DIAGNOSIS — Z79899 Other long term (current) drug therapy: Secondary | ICD-10-CM | POA: Diagnosis not present

## 2016-03-07 DIAGNOSIS — M47817 Spondylosis without myelopathy or radiculopathy, lumbosacral region: Secondary | ICD-10-CM | POA: Diagnosis not present

## 2016-03-13 ENCOUNTER — Ambulatory Visit: Payer: Self-pay | Admitting: Internal Medicine

## 2016-03-21 DIAGNOSIS — G894 Chronic pain syndrome: Secondary | ICD-10-CM | POA: Diagnosis not present

## 2016-03-21 DIAGNOSIS — Z79899 Other long term (current) drug therapy: Secondary | ICD-10-CM | POA: Diagnosis not present

## 2016-03-21 DIAGNOSIS — M47817 Spondylosis without myelopathy or radiculopathy, lumbosacral region: Secondary | ICD-10-CM | POA: Diagnosis not present

## 2016-03-27 ENCOUNTER — Ambulatory Visit (INDEPENDENT_AMBULATORY_CARE_PROVIDER_SITE_OTHER): Payer: Medicare Other | Admitting: Internal Medicine

## 2016-03-27 ENCOUNTER — Encounter: Payer: Self-pay | Admitting: Internal Medicine

## 2016-03-27 VITALS — BP 110/72 | HR 80 | Temp 97.8°F | Resp 16 | Ht 65.0 in | Wt 205.6 lb

## 2016-03-27 DIAGNOSIS — Z79899 Other long term (current) drug therapy: Secondary | ICD-10-CM | POA: Diagnosis not present

## 2016-03-27 DIAGNOSIS — I10 Essential (primary) hypertension: Secondary | ICD-10-CM

## 2016-03-27 DIAGNOSIS — E559 Vitamin D deficiency, unspecified: Secondary | ICD-10-CM

## 2016-03-27 DIAGNOSIS — E782 Mixed hyperlipidemia: Secondary | ICD-10-CM | POA: Diagnosis not present

## 2016-03-27 DIAGNOSIS — R7309 Other abnormal glucose: Secondary | ICD-10-CM | POA: Diagnosis not present

## 2016-03-27 DIAGNOSIS — Z23 Encounter for immunization: Secondary | ICD-10-CM

## 2016-03-27 DIAGNOSIS — K219 Gastro-esophageal reflux disease without esophagitis: Secondary | ICD-10-CM | POA: Insufficient documentation

## 2016-03-27 NOTE — Patient Instructions (Signed)

## 2016-03-27 NOTE — Progress Notes (Signed)
Patient ID: Tiffany [name], female   DOB: 07/01/1946, 70 y.o.   MRN: [id number]   This very nice 69 y.o. MWF presents for   follow up with Hypertension, Hyperlipidemia, Pre-Diabetes and Vitamin D Deficiency. Patient also has mild occult Hyperthyroidism and is being judiciously expectantly monitored for sequellae.    Patient is treated for HTN circa 1998 & BP has been controlled at home. Today's BP: 110/72 mmHg. Patient has had no complaints of any cardiac type chest pain, palpitations, dyspnea/orthopnea/PND, dizziness, claudication, or dependent edema.   Hyperlipidemia is controlled with diet & meds. Patient denies myalgias or other med SE's. Last Lipids were at goal with  Cholesterol 176; HDL 51; LDL 88; and sl elevated Triglycerides 186 on 08/16/2015.   Also, the patient has history of PreDiabetes since 2012 with A1c 5.3% and elevated Insulin 40 suspect for Insulin Resistance and she has had no symptoms of reactive hypoglycemia, diabetic polys, paresthesias or visual blurring.  Last A1c was 5.2% with Nl insulin 9.7 on 08/16/2015.    Further, the patient also has history of Vitamin D Deficiency of "41" in 2009  and supplements vitamin D without any suspected side-effects. Last vitamin D was  97 on 08/16/2015.   Medication Sig  . atenolol (TENORMIN) 100 MG tablet TAKE 1 TABLET BY MOUTH DAILY FOR BLOOD PRESSURE  . buPROPion- SR 150 MG 12 hr tablet TAKE 1 TABLET BY MOUTH TWICE DAILY  . Cholecalciferol (VITAMIN D PO) Take 5,000 Units by mouth 2 (two) times daily.   . diphenhydrAMINE 25 MG tablet Take 25 mg by mouth every 6 (six) hours as needed for allergies.   Marland Kitchen enalapril (VASOTEC) 20 MG tablet TAKE 1 TABLET BY MOUTH DAILY FOR HIGH BLOOD PRESSURE  . furosemide (LASIX) 40 MG tablet TAKE 1 TABLET BY MOUTH EVERY MORNING-NEEDS OFFICE VISIT WITHIN NEXT THREE MONTHS  . gabapentin  300 MG capsule Take 300 mg by mouth 3 (three) times daily. Takes 2 pills at dinner and 3 pills qhs  . methocarbamol  500 MG  tablet Take 500 mg by mouth 2 (two) times daily.  . Multiple Vitamins-Minerals Take 1 tablet by mouth at bedtime.   . pravastatin  40 MG tablet TAKE 1 TABLET BY MOUTH EVERY NIGHT AT BEDTIME FOR CHOLESTEROL  . ROXICET 5-325 MG  Take 1-2 tablets by mouth every 4 (four) hours as needed for severe pain.  . pantoprazole 40 MG tablet Take 1 tablet (40 mg total) by mouth 2 (two) times daily.   Allergies  Allergen Reactions  . Other Other (See Comments)    Stool softeners caused stomach bloating, nausea, vomiting bleeding ulcer and blood transfusion   PMHx:   Past Medical History  Diagnosis Date  . High blood pressure   . Gait difficulty     problems with balance  . Hyperlipidemia   . Pre-diabetes   . Vitamin D deficiency   . Obesity (BMI 35.0-39.9 without comorbidity) ([place])   . Neuropathy ([place])   . Hyperthyroidism mild  . Arthritis   . History of blood transfusion     05-14-15  . Anemia    Immunization History  Administered Date(s) Administered  . Influenza Split 11/22/2013  . Influenza, High Dose Seasonal PF 11/29/2015  . Pneumococcal Polysaccharide-23 10/01/1999  . Tdap 05/29/2008  . Zoster 10/09/2011   Past Surgical History  Procedure Laterality Date  . Vaginal hysterectomy    . Cervical disc arthroplasty    . Rotator cuff repair Right   . Spinal  fusion    . Knee replaced      right knee x 2  . Video bronchoscopy with endobronchial navigation N/A 04/18/2015    Procedure: VIDEO BRONCHOSCOPY WITH ENDOBRONCHIAL NAVIGATION;  Surgeon: Collene Gobble, MD;  Location: [facility];  Service: Thoracic;  Laterality: N/A;  . Video bronchoscopy with endobronchial ultrasound N/A 04/18/2015    Procedure: VIDEO BRONCHOSCOPY WITH ENDOBRONCHIAL ULTRASOUND;  Surgeon: Collene Gobble, MD;  Location: [place];  Service: Thoracic;  Laterality: N/A;  . Video assisted thoracoscopy (vats)/wedge resection Left 05/09/2015    Procedure: LEFT VIDEO ASSISTED THORACOSCOPY (VATS) WITH LEFT LUNG UPPER LOBE WEDGE  RESECTION;  Surgeon: [name], MD;  Location: [facility];  Service: Thoracic;  Laterality: Left;  . Lobectomy Left 05/09/2015    Procedure: LEFT LUNG UPPER LOBECTOMY;  Surgeon: [name], MD;  Location: [facility];  Service: Thoracic;  Laterality: Left;  . Cryo intercostal nerve block Left 05/09/2015    Procedure: CRYO INTERCOSTAL NERVE BLOCK;  Surgeon: [name], MD;  Location: [facility];  Service: Thoracic;  Laterality: Left;  . Lymph node dissection Left 05/09/2015    Procedure: LYMPH NODE DISSECTION;  Surgeon: [name], MD;  Location: [facility];  Service: Thoracic;  Laterality: Left;  . Esophagogastroduodenoscopy N/A 05/16/2015    Procedure: ESOPHAGOGASTRODUODENOSCOPY (EGD);  Surgeon: Irene Shipper, MD;  Location: [facility] ENDOSCOPY;  Service: Endoscopy;  Laterality: N/A;  possible ablation of a bleeding lesion  . Tonsillectomy  age 37  . Total hip arthroplasty Left 07/13/2015    Procedure: LEFT TOTAL HIP ARTHROPLASTY ANTERIOR APPROACH;  Surgeon: Mcarthur Rossetti, MD;  Location: [facility];  Service: Orthopedics;  Laterality: Left;   FHx:    Reviewed / unchanged  SHx:    Reviewed / unchanged  Systems Review:  Constitutional: Denies fever, chills, wt changes, headaches, insomnia, fatigue, night sweats, change in appetite. Eyes: Denies redness, blurred vision, diplopia, discharge, itchy, watery eyes.  ENT: Denies discharge, congestion, post nasal drip, epistaxis, sore throat, earache, hearing loss, dental pain, tinnitus, vertigo, sinus pain, snoring.  CV: Denies chest pain, palpitations, irregular heartbeat, syncope, dyspnea, diaphoresis, orthopnea, PND, claudication or edema. Respiratory: denies cough, dyspnea, DOE, pleurisy, hoarseness, laryngitis, wheezing.  Gastrointestinal: Denies dysphagia, odynophagia, heartburn, reflux, water brash, abdominal pain or cramps, nausea, vomiting, bloating, diarrhea, constipation, hematemesis, melena, hematochezia  or  hemorrhoids. Genitourinary: Denies dysuria, frequency, urgency, nocturia, hesitancy, discharge, hematuria or flank pain. Musculoskeletal: Denies arthralgias, myalgias, stiffness, jt. swelling, pain, limping or strain/sprain.  Skin: Denies pruritus, rash, hives, warts, acne, eczema or change in skin lesion(s). Neuro: No weakness, tremor, incoordination, spasms, paresthesia or pain. Psychiatric: Denies confusion, memory loss or sensory loss. Endo: Denies change in weight, skin or hair change.  Heme/Lymph: No excessive bleeding, bruising or enlarged lymph nodes.  Physical Exam  BP 110/72 mmHg  Pulse 80  Temp(Src) 97.8 F (36.6 C)  Resp 16  Ht '5\' 5"'$  (1.651 m)  Wt 205 lb 9.6 oz (93.26 kg)  BMI 34.21 kg/m2  Appears well nourished and in no distress. Eyes: PERRLA, EOMs, conjunctiva no swelling or erythema. Sinuses: No frontal/maxillary tenderness ENT/Mouth: EAC's clear, TM's nl w/o erythema, bulging. Nares clear w/o erythema, swelling, exudates. Oropharynx clear without erythema or exudates. Oral hygiene is good. Tongue normal, non obstructing. Hearing intact.  Neck: Supple. Thyroid nl. Car 2+/2+ without bruits, nodes or JVD. Chest: Respirations nl with BS clear & equal w/o rales, rhonchi, wheezing or stridor.  Cor: Heart sounds normal w/ regular rate  and rhythm without sig. murmurs, gallops, clicks, or rubs. Peripheral pulses normal and equal  without edema.  Abdomen: Soft & bowel sounds normal. Non-tender w/o guarding, rebound, hernias, masses, or organomegaly.  Lymphatics: Unremarkable.  Musculoskeletal: Full ROM all peripheral extremities, joint stability, 5/5 strength, and normal gait.  Skin: Warm, dry without exposed rashes, lesions or ecchymosis apparent.  Neuro: Cranial nerves intact, reflexes equal bilaterally. Sensory-motor testing grossly intact. Tendon reflexes grossly intact.  Pysch: Alert & oriented x 3.  Insight and judgement nl & appropriate. No ideations.  Assessment and  Plan:  1. Essential hypertension  - TSH  2. Hyperlipidemia  - Lipid panel - TSH  3. Abnormal glucose  - Hemoglobin A1c - Insulin, random  4. Vitamin D deficiency  - VITAMIN D 25 Hydroxy   5. Gastroesophageal reflux disease   6. Medication management  - CBC with Differential/Platelet - BASIC METABOLIC PANEL WITH GFR - Hepatic function panel - Magnesium   Recommended regular exercise, BP monitoring, weight control, and discussed med and SE's. Recommended labs to assess and monitor clinical status. Further disposition pending results of labs. Over 30 minutes of exam, counseling, chart review was performed

## 2016-03-28 LAB — BASIC METABOLIC PANEL WITH GFR
BUN: 23 mg/dL (ref 7–25)
CALCIUM: 9.7 mg/dL (ref 8.6–10.4)
CO2: 27 mmol/L (ref 20–31)
CREATININE: 1.05 mg/dL — AB (ref 0.50–0.99)
Chloride: 92 mmol/L — ABNORMAL LOW (ref 98–110)
GFR, Est African American: 64 mL/min (ref >=60)
GFR, Est Non African American: 55 mL/min — ABNORMAL LOW (ref >=60)
GLUCOSE: 88 mg/dL (ref 65–99)
Potassium: 4.6 mmol/L (ref 3.5–5.3)
Sodium: 130 mmol/L — ABNORMAL LOW (ref 135–146)

## 2016-03-28 LAB — HEPATIC FUNCTION PANEL
ALT: 13 U/L (ref 6–29)
AST: 18 U/L (ref 10–35)
Albumin: 4.3 g/dL (ref 3.6–5.1)
Alkaline Phosphatase: 91 U/L (ref 33–130)
BILIRUBIN INDIRECT: 0.3 mg/dL (ref 0.2–1.2)
Bilirubin, Direct: 0.1 mg/dL (ref <=0.2)
TOTAL PROTEIN: 7.1 g/dL (ref 6.1–8.1)
Total Bilirubin: 0.4 mg/dL (ref 0.2–1.2)

## 2016-03-28 LAB — CBC WITH DIFFERENTIAL/PLATELET
Basophils Absolute: 0.1 10*3/uL (ref 0.0–0.1)
Basophils Relative: 1 % (ref 0–1)
Eosinophils Absolute: 0.2 10*3/uL (ref 0.0–0.7)
Eosinophils Relative: 2 % (ref 0–5)
HEMATOCRIT: 41.6 % (ref 36.0–46.0)
HEMOGLOBIN: 13.5 g/dL (ref 12.0–15.0)
LYMPHS PCT: 29 % (ref 12–46)
Lymphs Abs: 2.3 10*3/uL (ref 0.7–4.0)
MCH: 30.4 pg (ref 26.0–34.0)
MCHC: 32.5 g/dL (ref 30.0–36.0)
MCV: 93.7 fL (ref 78.0–100.0)
MONO ABS: 0.5 10*3/uL (ref 0.1–1.0)
MONOS PCT: 6 % (ref 3–12)
MPV: 9.1 fL (ref 8.6–12.4)
NEUTROS ABS: 5 10*3/uL (ref 1.7–7.7)
Neutrophils Relative %: 62 % (ref 43–77)
Platelets: 351 10*3/uL (ref 150–400)
RBC: 4.44 MIL/uL (ref 3.87–5.11)
RDW: 14.1 % (ref 11.5–15.5)
WBC: 8.1 10*3/uL (ref 4.0–10.5)

## 2016-03-28 LAB — INSULIN, RANDOM: Insulin: 12.2 u[IU]/mL (ref 2.0–19.6)

## 2016-03-28 LAB — MAGNESIUM: MAGNESIUM: 1.9 mg/dL (ref 1.5–2.5)

## 2016-03-28 LAB — LIPID PANEL
CHOLESTEROL: 194 mg/dL (ref 125–200)
HDL: 78 mg/dL (ref >=46)
LDL Cholesterol: 78 mg/dL (ref <130)
Total CHOL/HDL Ratio: 2.5 Ratio (ref <=5.0)
Triglycerides: 189 mg/dL — ABNORMAL HIGH (ref <150)
VLDL: 38 mg/dL — AB (ref <30)

## 2016-03-28 LAB — TSH: TSH: 0.12 m[IU]/L — AB

## 2016-03-28 LAB — VITAMIN D 25 HYDROXY (VIT D DEFICIENCY, FRACTURES): Vit D, 25-Hydroxy: 70 ng/mL (ref 30–100)

## 2016-03-28 LAB — HEMOGLOBIN A1C
HEMOGLOBIN A1C: 5.1 % (ref <5.7)
MEAN PLASMA GLUCOSE: 100 mg/dL

## 2016-03-29 ENCOUNTER — Encounter: Payer: Self-pay | Admitting: Internal Medicine

## 2016-04-01 ENCOUNTER — Other Ambulatory Visit: Payer: Self-pay | Admitting: Internal Medicine

## 2016-04-17 ENCOUNTER — Other Ambulatory Visit: Payer: Self-pay | Admitting: Internal Medicine

## 2016-04-21 DIAGNOSIS — G894 Chronic pain syndrome: Secondary | ICD-10-CM | POA: Diagnosis not present

## 2016-04-21 DIAGNOSIS — M47817 Spondylosis without myelopathy or radiculopathy, lumbosacral region: Secondary | ICD-10-CM | POA: Diagnosis not present

## 2016-04-21 DIAGNOSIS — M5137 Other intervertebral disc degeneration, lumbosacral region: Secondary | ICD-10-CM | POA: Diagnosis not present

## 2016-04-21 DIAGNOSIS — Z79899 Other long term (current) drug therapy: Secondary | ICD-10-CM | POA: Diagnosis not present

## 2016-04-21 DIAGNOSIS — Z79891 Long term (current) use of opiate analgesic: Secondary | ICD-10-CM | POA: Diagnosis not present

## 2016-04-21 DIAGNOSIS — M25559 Pain in unspecified hip: Secondary | ICD-10-CM | POA: Diagnosis not present

## 2016-04-28 ENCOUNTER — Encounter: Payer: Self-pay | Admitting: Physician Assistant

## 2016-05-19 DIAGNOSIS — G894 Chronic pain syndrome: Secondary | ICD-10-CM | POA: Diagnosis not present

## 2016-05-19 DIAGNOSIS — Z79891 Long term (current) use of opiate analgesic: Secondary | ICD-10-CM | POA: Diagnosis not present

## 2016-05-19 DIAGNOSIS — M5137 Other intervertebral disc degeneration, lumbosacral region: Secondary | ICD-10-CM | POA: Diagnosis not present

## 2016-05-19 DIAGNOSIS — Z79899 Other long term (current) drug therapy: Secondary | ICD-10-CM | POA: Diagnosis not present

## 2016-05-19 DIAGNOSIS — M545 Low back pain: Secondary | ICD-10-CM | POA: Diagnosis not present

## 2016-05-19 DIAGNOSIS — M47817 Spondylosis without myelopathy or radiculopathy, lumbosacral region: Secondary | ICD-10-CM | POA: Diagnosis not present

## 2016-06-18 ENCOUNTER — Encounter: Payer: Self-pay | Admitting: Physician Assistant

## 2016-06-18 DIAGNOSIS — G894 Chronic pain syndrome: Secondary | ICD-10-CM | POA: Diagnosis not present

## 2016-06-18 DIAGNOSIS — Z79899 Other long term (current) drug therapy: Secondary | ICD-10-CM | POA: Diagnosis not present

## 2016-06-18 DIAGNOSIS — Z79891 Long term (current) use of opiate analgesic: Secondary | ICD-10-CM | POA: Diagnosis not present

## 2016-06-18 DIAGNOSIS — M47817 Spondylosis without myelopathy or radiculopathy, lumbosacral region: Secondary | ICD-10-CM | POA: Diagnosis not present

## 2016-06-18 DIAGNOSIS — M5137 Other intervertebral disc degeneration, lumbosacral region: Secondary | ICD-10-CM | POA: Diagnosis not present

## 2016-06-18 DIAGNOSIS — M545 Low back pain: Secondary | ICD-10-CM | POA: Diagnosis not present

## 2016-06-30 ENCOUNTER — Encounter: Payer: Self-pay | Admitting: Physician Assistant

## 2016-07-15 DIAGNOSIS — Z79891 Long term (current) use of opiate analgesic: Secondary | ICD-10-CM | POA: Diagnosis not present

## 2016-07-15 DIAGNOSIS — M5137 Other intervertebral disc degeneration, lumbosacral region: Secondary | ICD-10-CM | POA: Diagnosis not present

## 2016-07-15 DIAGNOSIS — Z79899 Other long term (current) drug therapy: Secondary | ICD-10-CM | POA: Diagnosis not present

## 2016-07-15 DIAGNOSIS — G894 Chronic pain syndrome: Secondary | ICD-10-CM | POA: Diagnosis not present

## 2016-07-15 DIAGNOSIS — M25559 Pain in unspecified hip: Secondary | ICD-10-CM | POA: Diagnosis not present

## 2016-07-15 DIAGNOSIS — M47817 Spondylosis without myelopathy or radiculopathy, lumbosacral region: Secondary | ICD-10-CM | POA: Diagnosis not present

## 2016-07-17 DIAGNOSIS — Z79891 Long term (current) use of opiate analgesic: Secondary | ICD-10-CM | POA: Diagnosis not present

## 2016-07-17 DIAGNOSIS — M792 Neuralgia and neuritis, unspecified: Secondary | ICD-10-CM | POA: Diagnosis not present

## 2016-07-17 DIAGNOSIS — R52 Pain, unspecified: Secondary | ICD-10-CM | POA: Diagnosis not present

## 2016-07-17 DIAGNOSIS — G894 Chronic pain syndrome: Secondary | ICD-10-CM | POA: Diagnosis not present

## 2016-07-17 DIAGNOSIS — F4542 Pain disorder with related psychological factors: Secondary | ICD-10-CM | POA: Diagnosis not present

## 2016-07-17 DIAGNOSIS — Z79899 Other long term (current) drug therapy: Secondary | ICD-10-CM | POA: Diagnosis not present

## 2016-07-29 DIAGNOSIS — M1288 Other specific arthropathies, not elsewhere classified, other specified site: Secondary | ICD-10-CM | POA: Diagnosis not present

## 2016-07-29 DIAGNOSIS — M545 Low back pain: Secondary | ICD-10-CM | POA: Diagnosis not present

## 2016-07-29 DIAGNOSIS — M4696 Unspecified inflammatory spondylopathy, lumbar region: Secondary | ICD-10-CM | POA: Diagnosis not present

## 2016-07-29 DIAGNOSIS — M5137 Other intervertebral disc degeneration, lumbosacral region: Secondary | ICD-10-CM | POA: Diagnosis not present

## 2016-07-31 ENCOUNTER — Other Ambulatory Visit: Payer: Self-pay | Admitting: Internal Medicine

## 2016-08-11 ENCOUNTER — Encounter: Payer: Self-pay | Admitting: Physician Assistant

## 2016-08-12 DIAGNOSIS — M5137 Other intervertebral disc degeneration, lumbosacral region: Secondary | ICD-10-CM | POA: Diagnosis not present

## 2016-08-12 DIAGNOSIS — M25559 Pain in unspecified hip: Secondary | ICD-10-CM | POA: Diagnosis not present

## 2016-08-12 DIAGNOSIS — Z79891 Long term (current) use of opiate analgesic: Secondary | ICD-10-CM | POA: Diagnosis not present

## 2016-08-12 DIAGNOSIS — G894 Chronic pain syndrome: Secondary | ICD-10-CM | POA: Diagnosis not present

## 2016-08-12 DIAGNOSIS — M1288 Other specific arthropathies, not elsewhere classified, other specified site: Secondary | ICD-10-CM | POA: Diagnosis not present

## 2016-08-12 DIAGNOSIS — Z79899 Other long term (current) drug therapy: Secondary | ICD-10-CM | POA: Diagnosis not present

## 2016-08-27 ENCOUNTER — Ambulatory Visit (INDEPENDENT_AMBULATORY_CARE_PROVIDER_SITE_OTHER): Payer: Medicare Other | Admitting: Physician Assistant

## 2016-08-27 ENCOUNTER — Encounter: Payer: Self-pay | Admitting: Physician Assistant

## 2016-08-27 VITALS — BP 130/64 | HR 73 | Temp 97.6°F | Resp 16 | Ht 66.0 in | Wt 202.2 lb

## 2016-08-27 DIAGNOSIS — R609 Edema, unspecified: Secondary | ICD-10-CM | POA: Diagnosis not present

## 2016-08-27 DIAGNOSIS — I10 Essential (primary) hypertension: Secondary | ICD-10-CM

## 2016-08-27 DIAGNOSIS — K259 Gastric ulcer, unspecified as acute or chronic, without hemorrhage or perforation: Secondary | ICD-10-CM

## 2016-08-27 DIAGNOSIS — Z0000 Encounter for general adult medical examination without abnormal findings: Secondary | ICD-10-CM

## 2016-08-27 DIAGNOSIS — R6889 Other general symptoms and signs: Secondary | ICD-10-CM

## 2016-08-27 DIAGNOSIS — E782 Mixed hyperlipidemia: Secondary | ICD-10-CM

## 2016-08-27 DIAGNOSIS — Z79899 Other long term (current) drug therapy: Secondary | ICD-10-CM | POA: Diagnosis not present

## 2016-08-27 DIAGNOSIS — Z23 Encounter for immunization: Secondary | ICD-10-CM

## 2016-08-27 DIAGNOSIS — Z0001 Encounter for general adult medical examination with abnormal findings: Secondary | ICD-10-CM

## 2016-08-27 DIAGNOSIS — G629 Polyneuropathy, unspecified: Secondary | ICD-10-CM | POA: Insufficient documentation

## 2016-08-27 DIAGNOSIS — G6289 Other specified polyneuropathies: Secondary | ICD-10-CM

## 2016-08-27 DIAGNOSIS — C3412 Malignant neoplasm of upper lobe, left bronchus or lung: Secondary | ICD-10-CM | POA: Diagnosis not present

## 2016-08-27 DIAGNOSIS — E559 Vitamin D deficiency, unspecified: Secondary | ICD-10-CM | POA: Diagnosis not present

## 2016-08-27 DIAGNOSIS — T39395A Adverse effect of other nonsteroidal anti-inflammatory drugs [NSAID], initial encounter: Secondary | ICD-10-CM

## 2016-08-27 DIAGNOSIS — K219 Gastro-esophageal reflux disease without esophagitis: Secondary | ICD-10-CM

## 2016-08-27 DIAGNOSIS — E0590 Thyrotoxicosis, unspecified without thyrotoxic crisis or storm: Secondary | ICD-10-CM

## 2016-08-27 DIAGNOSIS — M14671 Charcot's joint, right ankle and foot: Secondary | ICD-10-CM | POA: Diagnosis not present

## 2016-08-27 LAB — MAGNESIUM: Magnesium: 1.8 mg/dL (ref 1.5–2.5)

## 2016-08-27 LAB — BASIC METABOLIC PANEL WITH GFR
BUN: 18 mg/dL (ref 7–25)
CALCIUM: 9.4 mg/dL (ref 8.6–10.4)
CO2: 27 mmol/L (ref 20–31)
CREATININE: 1.06 mg/dL — AB (ref 0.50–0.99)
Chloride: 91 mmol/L — ABNORMAL LOW (ref 98–110)
GFR, EST AFRICAN AMERICAN: 63 mL/min (ref >=60)
GFR, Est Non African American: 54 mL/min — ABNORMAL LOW (ref >=60)
Glucose, Bld: 80 mg/dL (ref 65–99)
Potassium: 4 mmol/L (ref 3.5–5.3)
SODIUM: 129 mmol/L — AB (ref 135–146)

## 2016-08-27 LAB — CBC WITH DIFFERENTIAL/PLATELET
BASOS ABS: 0 {cells}/uL (ref 0–200)
Basophils Relative: 0 %
EOS PCT: 2 %
Eosinophils Absolute: 186 cells/uL (ref 15–500)
HCT: 41.5 % (ref 35.0–45.0)
Hemoglobin: 13.6 g/dL (ref 11.7–15.5)
Lymphocytes Relative: 24 %
Lymphs Abs: 2232 cells/uL (ref 850–3900)
MCH: 31.6 pg (ref 27.0–33.0)
MCHC: 32.8 g/dL (ref 32.0–36.0)
MCV: 96.3 fL (ref 80.0–100.0)
MONOS PCT: 6 %
MPV: 8.8 fL (ref 7.5–12.5)
Monocytes Absolute: 558 cells/uL (ref 200–950)
NEUTROS PCT: 68 %
Neutro Abs: 6324 cells/uL (ref 1500–7800)
PLATELETS: 323 10*3/uL (ref 140–400)
RBC: 4.31 MIL/uL (ref 3.80–5.10)
RDW: 13.5 % (ref 11.0–15.0)
WBC: 9.3 10*3/uL (ref 3.8–10.8)

## 2016-08-27 LAB — HEPATIC FUNCTION PANEL
ALT: 17 U/L (ref 6–29)
AST: 21 U/L (ref 10–35)
Albumin: 3.9 g/dL (ref 3.6–5.1)
Alkaline Phosphatase: 95 U/L (ref 33–130)
BILIRUBIN DIRECT: 0.1 mg/dL (ref <=0.2)
BILIRUBIN TOTAL: 0.4 mg/dL (ref 0.2–1.2)
Indirect Bilirubin: 0.3 mg/dL (ref 0.2–1.2)
TOTAL PROTEIN: 7.1 g/dL (ref 6.1–8.1)

## 2016-08-27 LAB — TSH: TSH: 0.05 m[IU]/L — AB

## 2016-08-27 LAB — LIPID PANEL
CHOLESTEROL: 214 mg/dL — AB (ref 125–200)
HDL: 105 mg/dL (ref >=46)
LDL Cholesterol: 88 mg/dL (ref <130)
Total CHOL/HDL Ratio: 2 Ratio (ref <=5.0)
Triglycerides: 105 mg/dL (ref <150)
VLDL: 21 mg/dL (ref <30)

## 2016-08-27 NOTE — Progress Notes (Signed)
MEDICARE ANNUAL WELLNESS VISIT AND FOLLOW UP  Assessment:   Essential hypertension - continue medications, DASH diet, exercise and monitor at home. Call if greater than 130/80.  -     CBC with Differential/Platelet -     BASIC METABOLIC PANEL WITH GFR -     Hepatic function panel -     TSH -     Urinalysis, Routine w reflex microscopic (not at [facility]) -     Microalbumin / creatinine urine ratio -     EKG 12-Lead -     DG Chest 2 View; Future  Malignant neoplasm of upper lobe of left lung ([place]) -     DG Chest 2 View; Future  Subclinical hyperthyroidism -     TSH - will continue to monitor, no symptoms, has had normal RAI  Gastroesophageal reflux disease, esophagitis presence not specified Continue PPI/H2 blocker, diet discussed  Gastric ulcer due to nonsteroidal antiinflammatory drug (NSAID) therapy Avoid NSAIDS  Charcot's Right foot Follows pain management  Vitamin D deficiency Continue supplement  Peripheral edema Continue lasix  Medication management -     Magnesium  Hyperlipidemia -     Lipid panel -continue medications, check lipids, decrease fatty foods, increase activity.   Encounter for Medicare annual wellness exam  Need for vaccination for H flu type B -     Flu vaccine HIGH DOSE PF  Other polyneuropathy ([place]) Continue gabapentin/pain management   Over 40 minutes of exam, counseling, chart review and critical decision making was performed Future Appointments Date Time Provider [place]  09/14/2017 2:00 PM Vicie Mutters, PA-C [facility] None     Plan:   During the course of the visit the patient was educated and counseled about appropriate screening and preventive services including:    Pneumococcal vaccine   Prevnar 13  Influenza vaccine  Td vaccine  Screening electrocardiogram  Bone densitometry screening  Colorectal cancer screening  Diabetes screening  Glaucoma screening  Nutrition counseling   Advanced  directives: requested   Subjective:  Tiffany [name] is a 70 y.o. female who presents for Medicare Annual Wellness Visit and follow up.    Her blood pressure has been controlled at home, today their BP is BP: 130/64 She does not workout. She denies chest pain, shortness of breath, dizziness.  Patient had VATS May 2016 by Dr. Roxan Hockey for LUL primary adenocarcinoma, no chemo/radiation.  She had hip replacement last year, walks with cane for balance, is on gabapentin and hydrocodone from preferred pain management for sciatica.    She is on cholesterol medication and denies myalgias. Her cholesterol is at goal. The cholesterol last visit was:   Lab Results  Component Value Date   CHOL 194 03/27/2016   HDL 78 03/27/2016   LDLCALC 78 03/27/2016   TRIG 189 (H) 03/27/2016   CHOLHDL 2.5 03/27/2016   Last A1C Lab Results  Component Value Date   HGBA1C 5.1 03/27/2016   Last GFR: Lab Results  Component Value Date   GFRNONAA 55 (L) 03/27/2016   Patient is on Vitamin D supplement.   Lab Results  Component Value Date   VD25OH 70 03/27/2016     BMI is Body mass index is 32.64 kg/m., she is working on diet and exercise. Wt Readings from Last 3 Encounters:  08/27/16 202 lb 3.2 oz (91.7 kg)  03/27/16 205 lb 9.6 oz (93.3 kg)  11/29/15 197 lb (89.4 kg)   Medication Review: Current Outpatient Prescriptions on File Prior to Visit  Medication Sig Dispense Refill  . atenolol (TENORMIN) 100 MG tablet TAKE 1 TABLET BY MOUTH DAILY FOR BLOOD PRESSURE 90 tablet 1  . buPROPion (WELLBUTRIN SR) 150 MG 12 hr tablet TAKE 1 TABLET BY MOUTH TWICE DAILY 60 tablet 2  . Cholecalciferol (VITAMIN D PO) Take 5,000 Units by mouth 2 (two) times daily.     . diphenhydrAMINE (BENADRYL) 25 MG tablet Take 25 mg by mouth every 6 (six) hours as needed for allergies.     Marland Kitchen enalapril (VASOTEC) 20 MG tablet TAKE 1 TABLET BY MOUTH DAILY FOR HIGH BLOOD PRESSURE 30 tablet 0  . furosemide (LASIX) 40 MG tablet TAKE 1  TABLET BY MOUTH EVERY MORNING-NEEDS OFFICE VISIT WITHIN NEXT THREE MONTHS 90 tablet 1  . gabapentin (NEURONTIN) 300 MG capsule Take 300 mg by mouth 3 (three) times daily. Takes 2 pills at dinner and 3 pills qhs    . HYDROcodone-acetaminophen (NORCO) 7.5-325 MG tablet     . Multiple Vitamins-Minerals (MULTIVITAMIN PO) Take 1 tablet by mouth at bedtime.     . pravastatin (PRAVACHOL) 40 MG tablet TAKE 1 TABLET BY MOUTH EVERY NIGHT AT BEDTIME FOR CHOLESTEROL 90 tablet 0   No current facility-administered medications on file prior to visit.     Allergies  Allergen Reactions  . Other Other (See Comments)    Stool softeners caused stomach bloating, nausea, vomiting bleeding ulcer and blood transfusion    Current Problems (verified) Patient Active Problem List   Diagnosis Date Noted  . Encounter for Medicare annual wellness exam 08/27/2016  . GERD (gastroesophageal reflux disease) 03/27/2016  . Subclinical hyperthyroidism 08/18/2015  . 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 Gastric ulcer due to nonsteroidal antiinflammatory drug (NSAID) therapy   . Lung cancer ([place]) 05/09/2015  . Peripheral edema 05/01/2015  . Vitamin D deficiency 07/18/2014  . Medication management 07/18/2014  . Hyperlipidemia 04/04/2014  . Charcot's Right foot 06/15/2013  . Hypertension     Screening Tests Immunization History  Administered Date(s) Administered  . Influenza Split 11/22/2013  . Influenza, High Dose Seasonal PF 11/29/2015  . Pneumococcal Conjugate-13 03/27/2016  . Pneumococcal Polysaccharide-23 10/01/1999  . Tdap 05/29/2008  . Zoster 10/09/2011   Preventative care: Last colonoscopy: 2008 EGD 2016 PFTs 03/2015 Last mammogram: 10/2014 DUE CXR 05/2015 due Last pap smear/pelvic exam: remote  DEXA:10/2014 Thyroid 2011  Prior vaccinations: TD or Tdap: 2009  Influenza: 2016 TODAY Pneumococcal: 2000 Prevnar13: 2017 Shingles/Zostavax: 2012  Names of Other  Physician/Practitioners you currently use: 1.  [facility] here for primary care 2. [place], eye doctor, last visit 2014 3. [place] dentist, last visit 2014 Patient Care Team: Unk Pinto, MD as PCP - General (Internal Medicine) Mcarthur Rossetti, MD as Consulting Physician (Orthopedic Surgery) [name], MD as Consulting Physician (Cardiothoracic Surgery) Collene Gobble, MD as Consulting Physician (Pulmonary Disease) Meredith Pel, MD as Consulting Physician (Orthopedic Surgery) Irene Shipper, MD as Consulting Physician (Gastroenterology)  SURGICAL HISTORY She  has a past surgical history that includes Vaginal hysterectomy; Cervical disc arthroplasty; Rotator cuff repair (Right); Spinal fusion; knee replaced; Video bronchoscopy with endobronchial navigation (N/A, 04/18/2015); Video bronchoscopy with endobronchial ultrasound (N/A, 04/18/2015); Video assisted thoracoscopy (vats)/wedge resection (Left, 05/09/2015); Lobectomy (Left, 05/09/2015); Cryo intercostal nerve block (Left, 05/09/2015); Lymph node dissection (Left, 05/09/2015); Esophagogastroduodenoscopy (N/A, 05/16/2015); Tonsillectomy (age 61); and Total hip arthroplasty (Left, 07/13/2015). FAMILY HISTORY Her family history includes Breast cancer in her maternal aunt; Colon cancer in her paternal  grandmother; Heart attack in her father; Hypertension in her mother. SOCIAL HISTORY She  reports that she quit smoking about 15 months ago. Her smoking use included Cigarettes. She has a 30.00 pack-year smoking history. She has never used smokeless tobacco. She reports that she drinks about 0.6 oz of alcohol per week . She reports that she does not use drugs.  MEDICARE WELLNESS OBJECTIVES: Physical activity:   Cardiac risk factors:   Depression/mood screen:   Depression screen [facility] 2/9 03/29/2016  Decreased Interest 0  Down, Depressed, Hopeless 0  PHQ - 2 Score 0    ADLs:  In your present  state of health, do you have any difficulty performing the following activities: 03/29/2016  Hearing? N  Vision? N  Difficulty concentrating or making decisions? N  Walking or climbing stairs? N  Dressing or bathing? N  Doing errands, shopping? N  Some recent data might be hidden    Cognitive Testing  Alert? Yes  Normal Appearance?Yes  Oriented to person? Yes  Place? Yes   Time? Yes  Recall of three objects?  Yes  Can perform simple calculations? Yes  Displays appropriate judgment?Yes  Can read the correct time from a watch face?Yes  EOL planning:    Review of Systems  Constitutional: Negative for fever.  HENT: Negative for congestion, ear pain and sore throat.   Eyes: Negative.   Respiratory: Negative for cough, shortness of breath and wheezing.   Cardiovascular: Positive for leg swelling. Negative for chest pain and palpitations.  Gastrointestinal: Negative for blood in stool, constipation, diarrhea, heartburn and melena.  Genitourinary: Negative.   Musculoskeletal: Positive for back pain and joint pain. Negative for falls.  Skin: Negative.   Neurological: Positive for sensory change (bilateral feet). Negative for dizziness, loss of consciousness and headaches.  Psychiatric/Behavioral: Negative for depression. The patient is not nervous/anxious and does not have insomnia.      Objective:     Today's Vitals   08/27/16 1401  BP: 130/64  Pulse: 73  Resp: 16  Temp: 97.6 F (36.4 C)  SpO2: 98%  Weight: 202 lb 3.2 oz (91.7 kg)  Height: '5\' 6"'$  (1.676 m)   Body mass index is 32.64 kg/m.  General appearance: alert, no distress, WD/WN, female HEENT: normocephalic, sclerae anicteric, TMs pearly, nares patent, no discharge or erythema, pharynx normal Oral cavity: MMM, no lesions Neck: supple, no lymphadenopathy, no thyromegaly, no masses Heart: RRR, normal S1, S2, no murmurs Lungs: CTA bilaterally, no wheezes, rhonchi, or rales Abdomen: +bs, soft, non tender, non  distended, no masses, no hepatomegaly, no splenomegaly Musculoskeletal: nontender, no swelling, no obvious deformity Extremities: no edema, no cyanosis, no clubbing Pulses: 2+ symmetric, upper and lower extremities, normal cap refill Neurological: alert, oriented x 3, CN2-12 intact, strength normal upper extremities and lower extremities, sensation normal throughout, DTRs 2+ throughout, no cerebellar signs, gait imbalanced, walks with cain Psychiatric: normal affect, behavior normal, pleasant   Medicare Attestation I have personally reviewed: The patient's medical and social history Their use of alcohol, tobacco or illicit drugs Their current medications and supplements The patient's functional ability including ADLs,fall risks, home safety risks, cognitive, and hearing and visual impairment Diet and physical activities Evidence for depression or mood disorders  The patient's weight, height, BMI, and visual acuity have been recorded in the chart.  I have made referrals, counseling, and provided education to the patient based on review of the above and I have provided the patient with a written personalized care plan for preventive  services.     Vicie Mutters, PA-C   08/27/2016

## 2016-08-27 NOTE — Patient Instructions (Addendum)
[facility]  7 a.m.-6:30 p.m., Monday 7 a.m.-5 p.m., Tuesday-Friday Schedule an appointment by calling [phone].      Bad carbs also include fruit juice, alcohol, and sweet tea. These are empty calories that do not signal to your brain that you are full.   Please remember the good carbs are still carbs which convert into sugar. So please measure them out no more than 1/2-1 cup of rice, oatmeal, pasta, and beans  Veggies are however free foods! Pile them on.   Not all fruit is created equal. Please see the list below, the fruit at the bottom is higher in sugars than the fruit at the top. Please avoid all dried fruits.

## 2016-08-28 ENCOUNTER — Other Ambulatory Visit: Payer: Self-pay | Admitting: Internal Medicine

## 2016-08-28 LAB — URINALYSIS, ROUTINE W REFLEX MICROSCOPIC

## 2016-08-28 LAB — MICROALBUMIN / CREATININE URINE RATIO

## 2016-09-02 DIAGNOSIS — M1288 Other specific arthropathies, not elsewhere classified, other specified site: Secondary | ICD-10-CM | POA: Diagnosis not present

## 2016-09-02 DIAGNOSIS — M5137 Other intervertebral disc degeneration, lumbosacral region: Secondary | ICD-10-CM | POA: Diagnosis not present

## 2016-09-02 DIAGNOSIS — M47817 Spondylosis without myelopathy or radiculopathy, lumbosacral region: Secondary | ICD-10-CM | POA: Diagnosis not present

## 2016-09-02 DIAGNOSIS — M4696 Unspecified inflammatory spondylopathy, lumbar region: Secondary | ICD-10-CM | POA: Diagnosis not present

## 2016-09-08 DIAGNOSIS — G894 Chronic pain syndrome: Secondary | ICD-10-CM | POA: Diagnosis not present

## 2016-09-08 DIAGNOSIS — Z79891 Long term (current) use of opiate analgesic: Secondary | ICD-10-CM | POA: Diagnosis not present

## 2016-09-08 DIAGNOSIS — M25559 Pain in unspecified hip: Secondary | ICD-10-CM | POA: Diagnosis not present

## 2016-09-08 DIAGNOSIS — Z79899 Other long term (current) drug therapy: Secondary | ICD-10-CM | POA: Diagnosis not present

## 2016-09-08 DIAGNOSIS — M47817 Spondylosis without myelopathy or radiculopathy, lumbosacral region: Secondary | ICD-10-CM | POA: Diagnosis not present

## 2016-09-08 DIAGNOSIS — M5137 Other intervertebral disc degeneration, lumbosacral region: Secondary | ICD-10-CM | POA: Diagnosis not present

## 2016-09-09 ENCOUNTER — Encounter: Payer: Self-pay | Admitting: Physician Assistant

## 2016-09-09 ENCOUNTER — Ambulatory Visit (INDEPENDENT_AMBULATORY_CARE_PROVIDER_SITE_OTHER): Payer: Medicare Other | Admitting: Physician Assistant

## 2016-09-09 VITALS — BP 136/70 | HR 80 | Temp 97.7°F | Resp 14 | Ht 66.0 in | Wt 210.0 lb

## 2016-09-09 DIAGNOSIS — E871 Hypo-osmolality and hyponatremia: Secondary | ICD-10-CM

## 2016-09-09 DIAGNOSIS — C3412 Malignant neoplasm of upper lobe, left bronchus or lung: Secondary | ICD-10-CM | POA: Diagnosis not present

## 2016-09-09 LAB — BASIC METABOLIC PANEL WITH GFR
BUN: 19 mg/dL (ref 7–25)
CHLORIDE: 93 mmol/L — AB (ref 98–110)
CO2: 26 mmol/L (ref 20–31)
Calcium: 9.4 mg/dL (ref 8.6–10.4)
Creat: 0.93 mg/dL (ref 0.50–0.99)
GFR, EST NON AFRICAN AMERICAN: 64 mL/min (ref >=60)
GFR, Est African American: 74 mL/min (ref >=60)
GLUCOSE: 83 mg/dL (ref 65–99)
POTASSIUM: 4.9 mmol/L (ref 3.5–5.3)
Sodium: 130 mmol/L — ABNORMAL LOW (ref 135–146)

## 2016-09-09 NOTE — Patient Instructions (Signed)
Can increase salt in diet Can stop wellbutrin or go down to one a day  Can take lasix as needed  monitor weight daily  Call your doctor if:  Anytime you have any of the following symptoms:  -  shortness of breath, with or without a dry hacking cough  -  swelling in the hands, feet or stomach  - if you have to sleep on extra pillows at night in order to breathe. -  after laying down at night for 20-30 mins, you wake up short of breath.   These can all be signs of fluid overload.    Hyponatremia Hyponatremia is when the amount of salt (sodium) in your blood is too low. When sodium levels are low, your cells absorb extra water and they swell. The swelling happens throughout the body, but it mostly affects the brain. CAUSES This condition may be caused by:  Heart, kidney, or liver problems.  Thyroid problems.  Adrenal gland problems.  Metabolic conditions, such as syndrome of inappropriate antidiuretic hormone (SIADH).  Severe vomiting and diarrhea.  Certain medicines or illegal drugs.  Dehydration.  Drinking too much water.  Eating a diet that is low in sodium.  Large burns on your body.  Sweating. RISK FACTORS This condition is more likely to develop in people who:  Have long-term (chronic) kidney disease.  Have heart failure.  Have a medical condition that causes frequent or excessive diarrhea.  Have metabolic conditions, such as Addison disease or SIADH.  Take certain medicines that affect the sodium and fluid balance in the blood. Some of these medicine types include:  Diuretics.  NSAIDs.  Some opioid pain medicines.  Some antidepressants.  Some seizure prevention medicines. SYMPTOMS  Symptoms of this condition include:  Nausea and vomiting.  Confusion.  Lethargy.  Agitation.  Headache.  Seizures.  Unconsciousness.  Appetite loss.  Muscle weakness and cramping.  Feeling weak or light-headed.  Having a rapid heart  rate.  Fainting, in severe cases. DIAGNOSIS This condition is diagnosed with a medical history and physical exam. You will also have other tests, including:  Blood tests.  Urine tests. TREATMENT Treatment for this condition depends on the cause. Treatment may include:  Fluids given through an IV tube that is inserted into one of your veins.  Medicines to correct the sodium imbalance. If medicines are causing the condition, the medicines will need to be adjusted.  Limiting water or fluid intake to get the correct sodium balance. HOME CARE INSTRUCTIONS  Take medicines only as directed by your health care provider. Many medicines can make this condition worse. Talk with your health care provider about any medicines that you are currently taking.  Carefully follow a recommended diet as directed by your health care provider.  Carefully follow instructions from your health care provider about fluid restrictions.  Keep all follow-up visits as directed by your health care provider. This is important.  Do not drink alcohol. SEEK MEDICAL CARE IF:  You develop worsening nausea, fatigue, headache, confusion, or weakness.  Your symptoms go away and then return.  You have problems following the recommended diet. SEEK IMMEDIATE MEDICAL CARE IF:  You have a seizure.  You faint.  You have ongoing diarrhea or vomiting.   This information is not intended to replace advice given to you by your health care provider. Make sure you discuss any questions you have with your health care provider.   Document Released: 12/05/2002 Document Revised: 05/01/2015 Document Reviewed: 01/04/2015 Elsevier Interactive Patient  Education 2016 [employer].

## 2016-09-09 NOTE — Progress Notes (Signed)
Assessment and Plan: 1. Malignant neoplasm of upper lobe of left lung ([place]) Repeat CXR  2. Hyponatremia Increase salt, decrease fluids, take lasix as needed, no PND/orthopnea, mild edema No signs of hyponatremia, can stop wellbutrin, check labs.  - Osmolality - Sodium, urine, random - Osmolality, urine - BASIC METABOLIC PANEL WITH GFR - Urinalysis, Routine w reflex microscopic (not at [facility])  Future Appointments Date Time Provider [place]  12/03/2016 2:30 PM Starlyn Skeans, PA-C [facility] None  09/14/2017 2:00 PM Vicie Mutters, PA-C [facility] None     HPI 70 y.o.female presents for 2 week follow up for hyponatremia.  Patient's sodium was low last visit, she was to stop lasix and cut wellbutrin down to 1 pill. She has history of smoking and lung cancer s/p VATS 2016. Due for chest x ray.  No SOB, no PND, orthopnea, some mild swelling, no muscle weakness, HA, fatigue, ETC.  Lab Results  Component Value Date   NA 129 (L) 08/27/2016   NA 130 (L) 03/27/2016   NA 132 (L) 11/29/2015   Blood pressure 136/70, pulse 80, temperature 97.7 F (36.5 C), resp. rate 14, height '5\' 6"'$  (1.676 m), weight 210 lb (95.3 kg), SpO2 99 %.  BMI is Body mass index is 33.89 kg/m., she is working on diet and exercise. Wt Readings from Last 3 Encounters:  09/09/16 210 lb (95.3 kg)  08/27/16 202 lb 3.2 oz (91.7 kg)  03/27/16 205 lb 9.6 oz (93.3 kg)    Past Medical History:  Diagnosis Date  . Anemia   . Arthritis   . Gait difficulty    problems with balance  . High blood pressure   . History of blood transfusion    05-14-15  . Hyperlipidemia   . Hyperthyroidism mild  . Neuropathy ([place])   . Obesity (BMI 35.0-39.9 without comorbidity) ([place])   . Pre-diabetes   . Vitamin D deficiency      Allergies  Allergen Reactions  . Other Other (See Comments)    Stool softeners caused stomach bloating, nausea, vomiting bleeding ulcer and blood transfusion      Current Outpatient  Prescriptions on File Prior to Visit  Medication Sig Dispense Refill  . atenolol (TENORMIN) 100 MG tablet TAKE 1 TABLET BY MOUTH DAILY FOR BLOOD PRESSURE 90 tablet 1  . buPROPion (WELLBUTRIN SR) 150 MG 12 hr tablet TAKE 1 TABLET BY MOUTH TWICE DAILY 60 tablet 2  . Cholecalciferol (VITAMIN D PO) Take 5,000 Units by mouth 2 (two) times daily.     . diphenhydrAMINE (BENADRYL) 25 MG tablet Take 25 mg by mouth every 6 (six) hours as needed for allergies.     Marland Kitchen enalapril (VASOTEC) 20 MG tablet TAKE 1 TABLET BY MOUTH DAILY FOR HIGH BLOOD PRESSURE 30 tablet 0  . furosemide (LASIX) 40 MG tablet TAKE 1 TABLET BY MOUTH EVERY MORNING-NEEDS OFFICE VISIT WITHIN NEXT THREE MONTHS 90 tablet 1  . gabapentin (NEURONTIN) 300 MG capsule Take 300 mg by mouth 3 (three) times daily. Takes 2 pills at dinner and 3 pills qhs    . HYDROcodone-acetaminophen (NORCO) 7.5-325 MG tablet     . Multiple Vitamins-Minerals (MULTIVITAMIN PO) Take 1 tablet by mouth at bedtime.     . pravastatin (PRAVACHOL) 40 MG tablet TAKE 1 TABLET BY MOUTH EVERY NIGHT AT BEDTIME FOR CHOLESTEROL 90 tablet 1   No current facility-administered medications on file prior to visit.     ROS: all negative except above.   Physical Exam: [employer]  09/09/16 1422  Weight: 210 lb (95.3 kg)   BP 136/70   Pulse 80   Temp 97.7 F (36.5 C)   Resp 14   Ht '5\' 6"'$  (1.676 m)   Wt 210 lb (95.3 kg)   SpO2 99%   BMI 33.89 kg/m  General appearance: alert, no distress, WD/WN, female HEENT: normocephalic, sclerae anicteric, TMs pearly, nares patent, no discharge or erythema, pharynx normal Oral cavity: MMM, no lesions Neck: supple, no lymphadenopathy, no thyromegaly, no masses Heart: RRR, normal S1, S2, no murmurs Lungs: CTA bilaterally, no wheezes, rhonchi, or rales Abdomen: +bs, soft, non tender, non distended, no masses, no hepatomegaly, no splenomegaly Musculoskeletal: nontender, no swelling, no obvious deformity Extremities: no edema, no  cyanosis, no clubbing Pulses: 2+ symmetric, upper and lower extremities, normal cap refill Neurological: alert, oriented x 3, CN2-12 intact, strength normal upper extremities and lower extremities, sensation normal throughout, DTRs 2+ throughout, no cerebellar signs, gait imbalanced, walks with cane Psychiatric: normal affect, behavior normal, pleasant   Vicie Mutters, PA-C 2:32 PM [facility]

## 2016-09-10 LAB — URINALYSIS, ROUTINE W REFLEX MICROSCOPIC
BILIRUBIN URINE: NEGATIVE
GLUCOSE, UA: NEGATIVE
Hgb urine dipstick: NEGATIVE
Ketones, ur: NEGATIVE
Nitrite: NEGATIVE
PH: 7 (ref 5.0–8.0)
Protein, ur: NEGATIVE
Specific Gravity, Urine: 1.01 (ref 1.001–1.035)

## 2016-09-10 LAB — SODIUM, URINE, RANDOM: Sodium, Ur: 16 mmol/L — ABNORMAL LOW (ref 28–272)

## 2016-09-10 LAB — OSMOLALITY, URINE: Osmolality, Ur: 208 mOsm/kg (ref 50–1200)

## 2016-09-10 LAB — URINALYSIS, MICROSCOPIC ONLY
BACTERIA UA: NONE SEEN [HPF]
CASTS: NONE SEEN [LPF]
CRYSTALS: NONE SEEN [HPF]
YEAST: NONE SEEN [HPF]

## 2016-09-10 LAB — OSMOLALITY: Osmolality: 273 mOsm/kg — ABNORMAL LOW (ref 278–305)

## 2016-09-22 ENCOUNTER — Other Ambulatory Visit: Payer: Self-pay | Admitting: Physician Assistant

## 2016-09-22 MED ORDER — NYSTATIN 100000 UNIT/GM EX CREA: 1.0000 "application " | TOPICAL_CREAM | Freq: Two times a day (BID) | CUTANEOUS | 1 refills | Status: DC

## 2016-09-24 ENCOUNTER — Other Ambulatory Visit: Payer: Self-pay | Admitting: Physician Assistant

## 2016-09-30 DIAGNOSIS — G894 Chronic pain syndrome: Secondary | ICD-10-CM | POA: Diagnosis not present

## 2016-09-30 DIAGNOSIS — M25559 Pain in unspecified hip: Secondary | ICD-10-CM | POA: Diagnosis not present

## 2016-09-30 DIAGNOSIS — M5137 Other intervertebral disc degeneration, lumbosacral region: Secondary | ICD-10-CM | POA: Diagnosis not present

## 2016-09-30 DIAGNOSIS — M4696 Unspecified inflammatory spondylopathy, lumbar region: Secondary | ICD-10-CM | POA: Diagnosis not present

## 2016-10-06 DIAGNOSIS — M25559 Pain in unspecified hip: Secondary | ICD-10-CM | POA: Diagnosis not present

## 2016-10-06 DIAGNOSIS — Z79899 Other long term (current) drug therapy: Secondary | ICD-10-CM | POA: Diagnosis not present

## 2016-10-06 DIAGNOSIS — Z79891 Long term (current) use of opiate analgesic: Secondary | ICD-10-CM | POA: Diagnosis not present

## 2016-10-06 DIAGNOSIS — M47817 Spondylosis without myelopathy or radiculopathy, lumbosacral region: Secondary | ICD-10-CM | POA: Diagnosis not present

## 2016-10-06 DIAGNOSIS — M4696 Unspecified inflammatory spondylopathy, lumbar region: Secondary | ICD-10-CM | POA: Diagnosis not present

## 2016-10-06 DIAGNOSIS — G894 Chronic pain syndrome: Secondary | ICD-10-CM | POA: Diagnosis not present

## 2016-10-19 ENCOUNTER — Other Ambulatory Visit: Payer: Self-pay | Admitting: Internal Medicine

## 2016-10-19 ENCOUNTER — Other Ambulatory Visit: Payer: Self-pay | Admitting: Physician Assistant

## 2016-11-03 IMAGING — CT CT CHEST W/ CM
2 of 3 series · 15 of 30 positions shown, 17 images · IV contrast (75CC ISOVUE 300)
Comparison: Chest x-ray 02/27/2015.

CLINICAL DATA: Lung nodules, smoker, hyponatremia. Persistent left
upper lobe opacity. Smoker with cough.

EXAM:
CT CHEST WITH CONTRAST
TECHNIQUE: Multidetector CT imaging of the chest was performed during
intravenous contrast administration.
CONTRAST:  75 cc Isovue 300 IV

[Series 3: chest with · axial · 0.68mm/px · z∈[-303,-68]mm · 7 of 63 slices shown, 9 images]
[im 8/63  mediastinal]
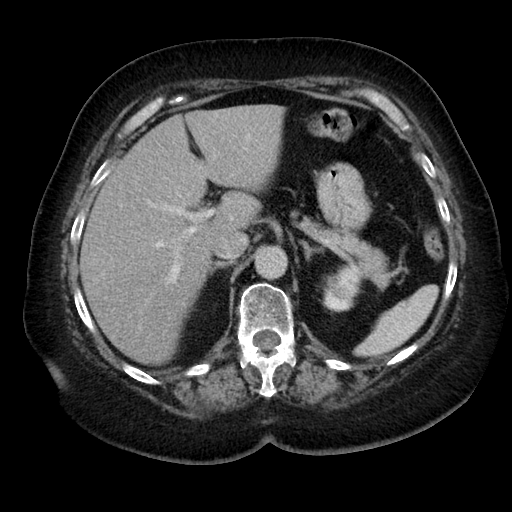
[im 8/63  lung]
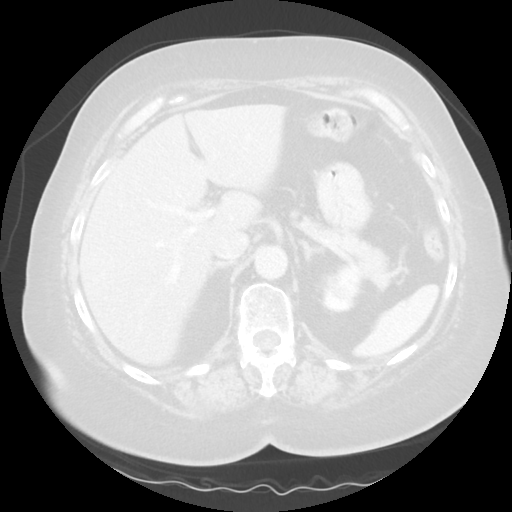
[im 16/63  lung]
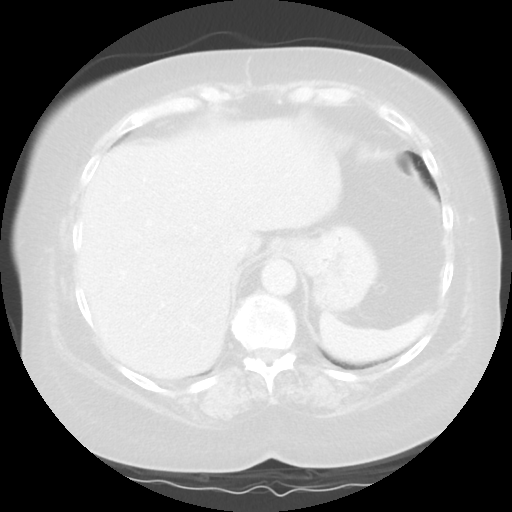
[im 24/63  lung]
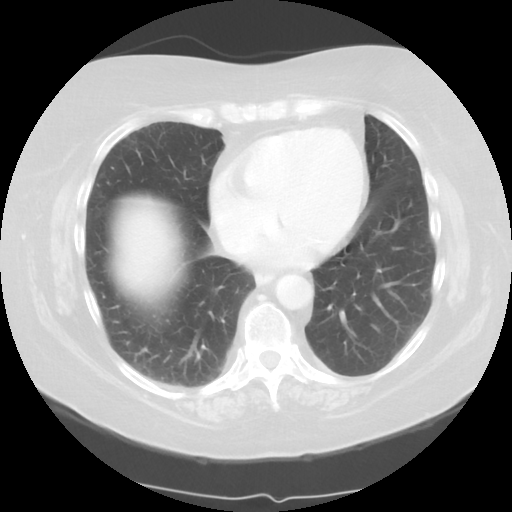
[im 32/63  lung]
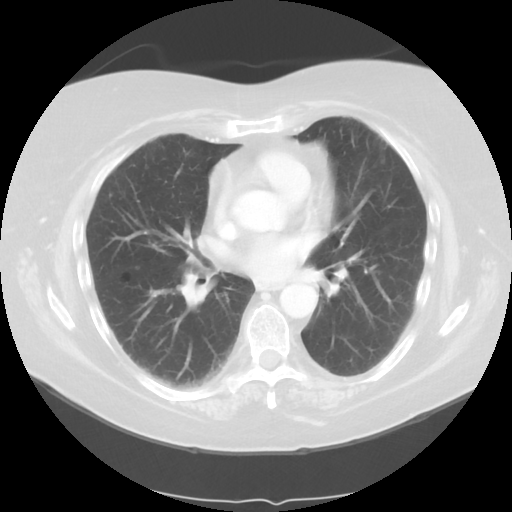
[im 39/63  mediastinal]
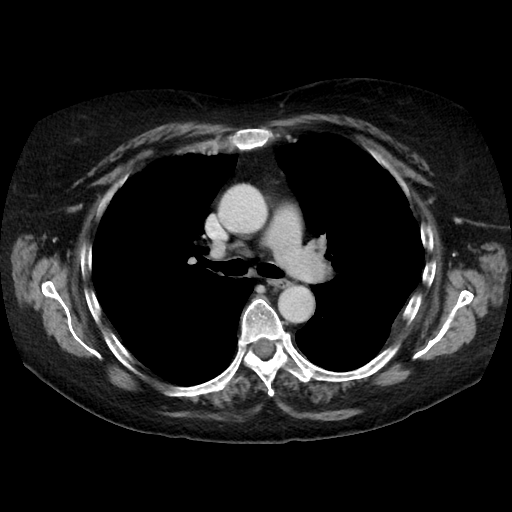
[im 39/63  lung]
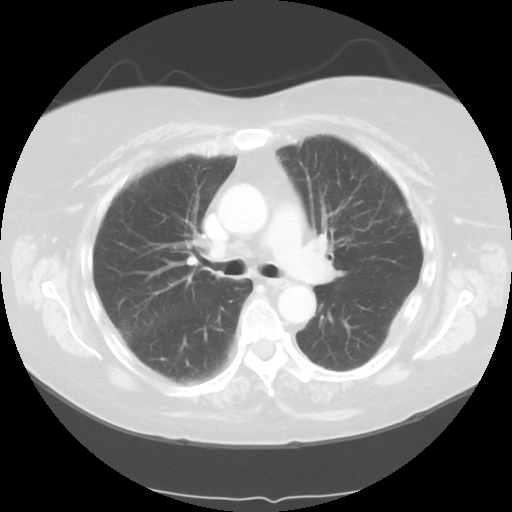
[im 47/63  lung]
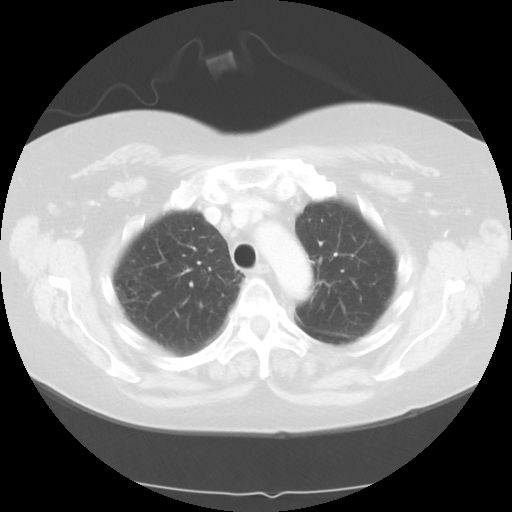
[im 55/63  lung]
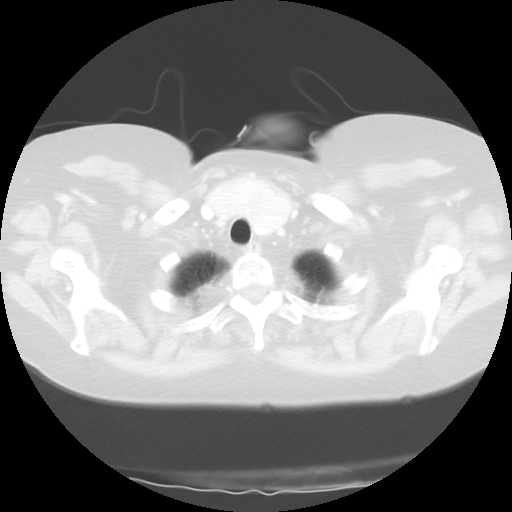

[Series 602: sagittal body · sagittal · 0.68mm/px · 8 of 141 slices shown]
[im 16/141  mediastinal]
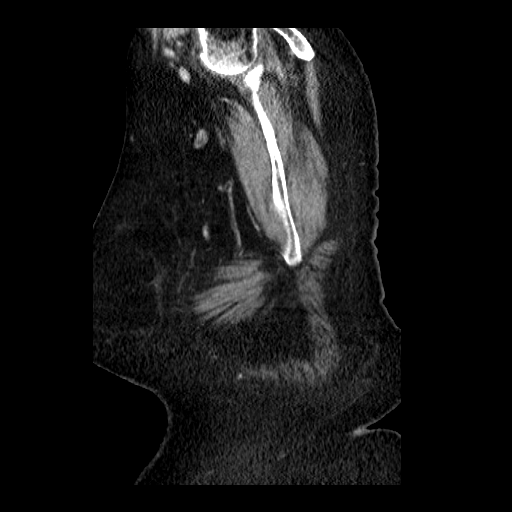
[im 32/141  mediastinal]
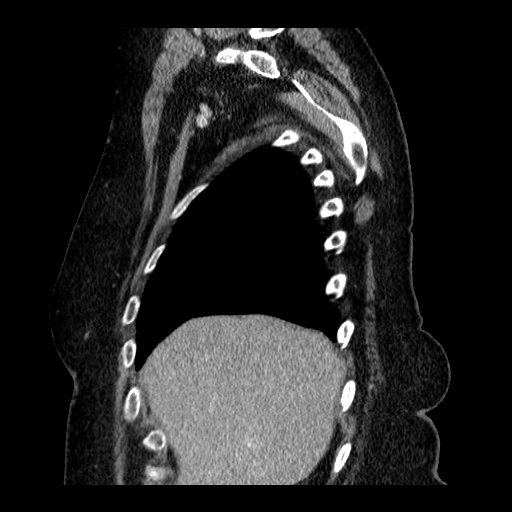
[im 47/141  mediastinal]
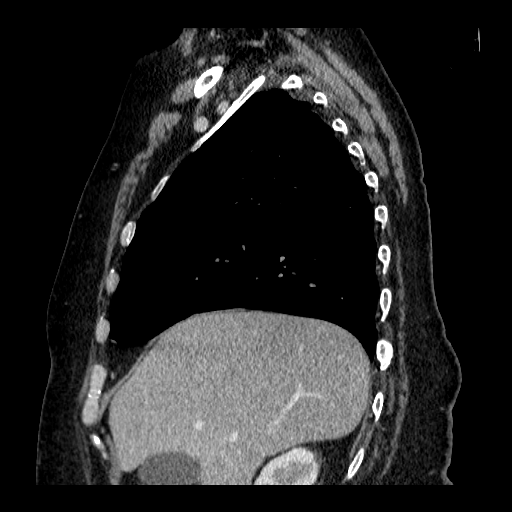
[im 63/141  mediastinal]
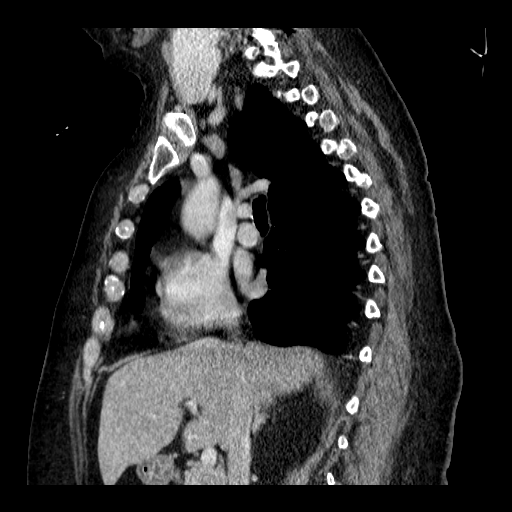
[im 78/141  mediastinal]
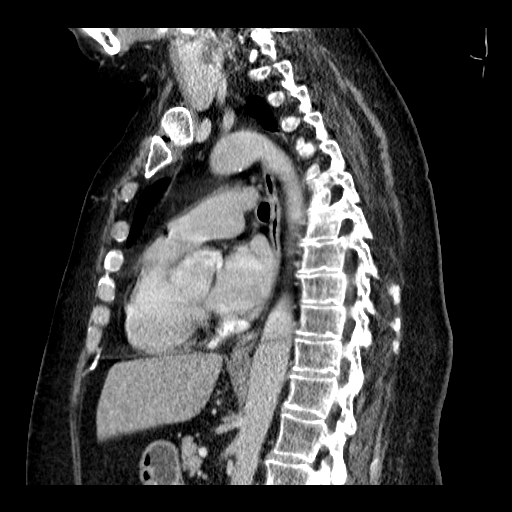
[im 94/141  mediastinal]
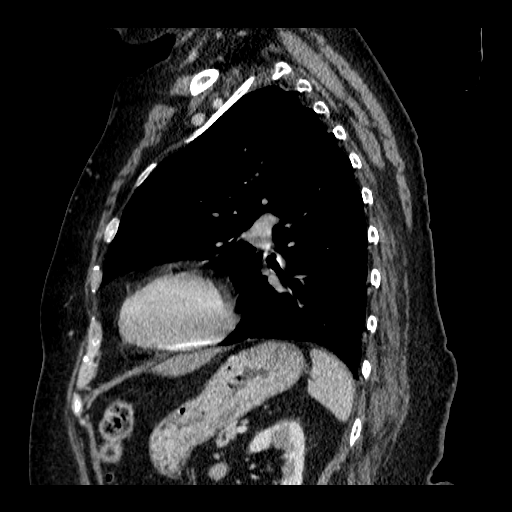
[im 109/141  mediastinal]
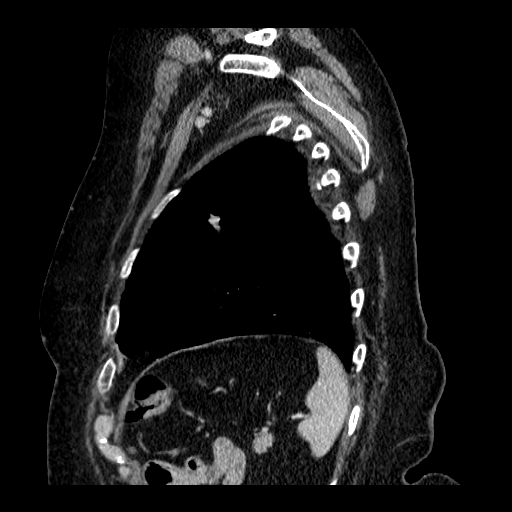
[im 125/141  mediastinal]
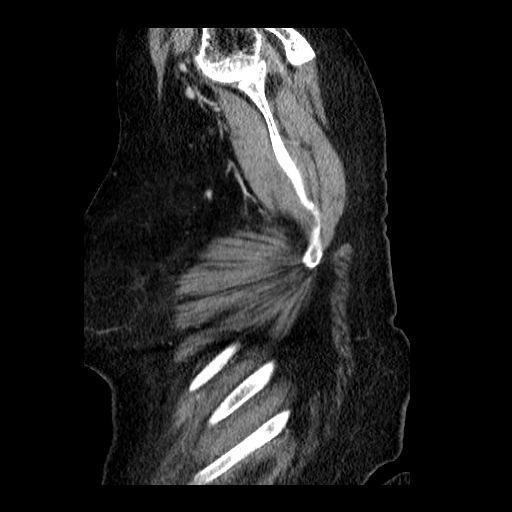

[15 of 30 positions shown; findings below may reference images not displayed]

FINDINGS: CT CHEST FINDINGS

Mediastinum/Nodes: No mediastinal, hilar, or axillary adenopathy.
Heart is normal size. Aorta is normal caliber. Multiple small
bilateral thyroid nodules, most compatible with multinodular goiter.

Lungs/Pleura: 2 adjacent nodules in the left upper lobe
corresponding to the density on prior chest x-ray. These measure 14
mm (lateral) and 13 mm (medial) on image 27. The lateral nodule has
a somewhat spiculated appearance. No other suspicious pulmonary
nodules. No pleural effusions. No confluent areas of consolidation.

Musculoskeletal: Chest wall soft tissues are unremarkable. No acute
bony abnormality or focal bone lesion.

Upper abdomen: Imaging into the upper abdomen shows no acute
findings.
IMPRESSION: Two adjacent left upper lobe pulmonary nodules as described above.
Cannot exclude lung cancer. These should be further evaluated with
PET CT.

Multinodular scratch head multiple nodules throughout the thyroid,
likely multinodular goiter.

## 2016-11-17 DIAGNOSIS — M5137 Other intervertebral disc degeneration, lumbosacral region: Secondary | ICD-10-CM | POA: Diagnosis not present

## 2016-11-17 DIAGNOSIS — M4696 Unspecified inflammatory spondylopathy, lumbar region: Secondary | ICD-10-CM | POA: Diagnosis not present

## 2016-11-17 DIAGNOSIS — Z79899 Other long term (current) drug therapy: Secondary | ICD-10-CM | POA: Diagnosis not present

## 2016-11-17 DIAGNOSIS — G894 Chronic pain syndrome: Secondary | ICD-10-CM | POA: Diagnosis not present

## 2016-11-17 DIAGNOSIS — Z79891 Long term (current) use of opiate analgesic: Secondary | ICD-10-CM | POA: Diagnosis not present

## 2016-11-17 DIAGNOSIS — M47817 Spondylosis without myelopathy or radiculopathy, lumbosacral region: Secondary | ICD-10-CM | POA: Diagnosis not present

## 2016-11-25 IMAGING — CR DG CHEST 1V PORT
1 series · 1 of 1 positions shown · non-contrast
Comparison: PET-CT 04/03/2015, and earlier.

CLINICAL DATA: 68-year-old female status post bronchoscopy with
biopsy. Initial encounter.

EXAM:
PORTABLE CHEST - 1 VIEW

[AP]
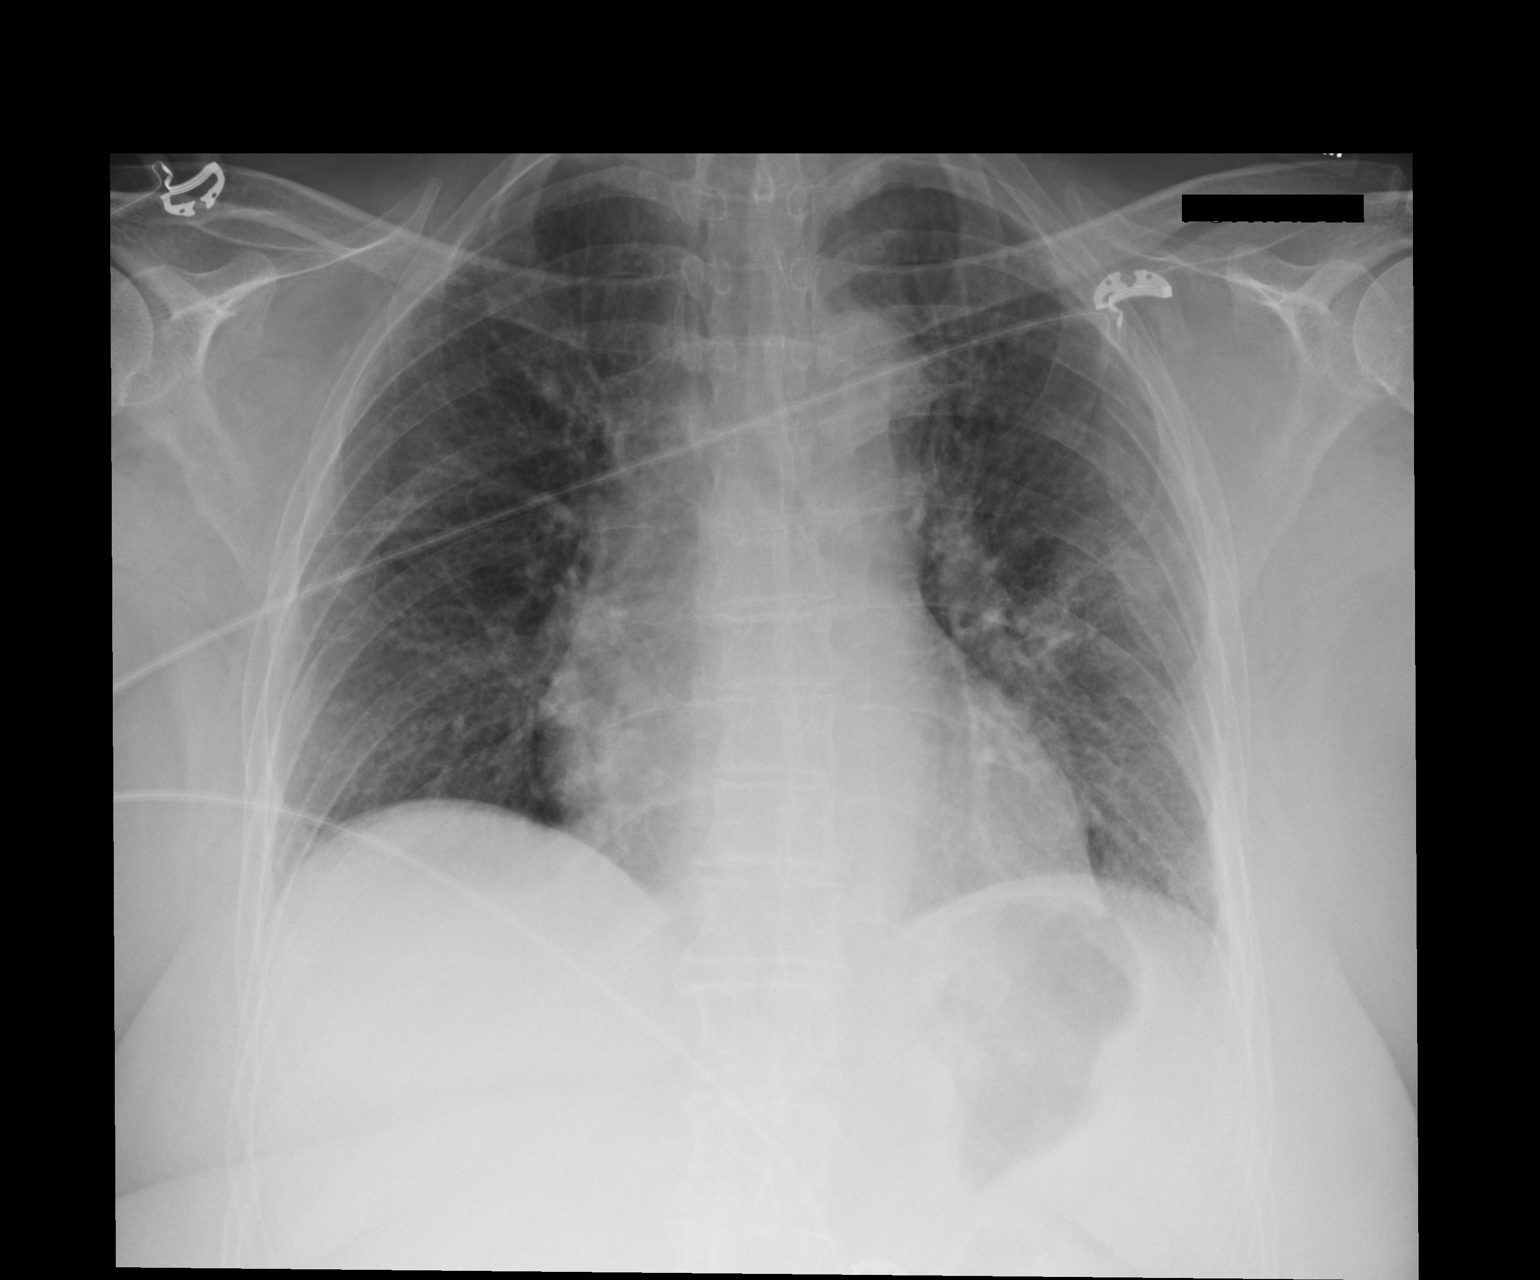

[1 of 1 positions shown; findings below may reference images not displayed]

FINDINGS: Portable AP semi upright view at 7558 hours. Lower lung volumes.
Indistinct left mid lung nodule re- identified. No pneumothorax.
Stable apical scarring. No pulmonary edema, pleural effusion or new
pulmonary opacity. Stable cardiac size and mediastinal contours.
IMPRESSION: No adverse features status post bronchoscopy with biopsy. Lower lung
volumes. Known left mid lung nodule.

## 2016-12-03 ENCOUNTER — Encounter: Payer: Self-pay | Admitting: Internal Medicine

## 2016-12-03 ENCOUNTER — Ambulatory Visit (INDEPENDENT_AMBULATORY_CARE_PROVIDER_SITE_OTHER): Payer: Medicare Other | Admitting: Internal Medicine

## 2016-12-03 VITALS — BP 138/82 | HR 78 | Temp 98.0°F | Resp 18 | Ht 66.0 in | Wt 212.0 lb

## 2016-12-03 DIAGNOSIS — E559 Vitamin D deficiency, unspecified: Secondary | ICD-10-CM | POA: Diagnosis not present

## 2016-12-03 DIAGNOSIS — E782 Mixed hyperlipidemia: Secondary | ICD-10-CM | POA: Diagnosis not present

## 2016-12-03 DIAGNOSIS — C3412 Malignant neoplasm of upper lobe, left bronchus or lung: Secondary | ICD-10-CM

## 2016-12-03 DIAGNOSIS — E0590 Thyrotoxicosis, unspecified without thyrotoxic crisis or storm: Secondary | ICD-10-CM | POA: Diagnosis not present

## 2016-12-03 DIAGNOSIS — I10 Essential (primary) hypertension: Secondary | ICD-10-CM

## 2016-12-03 DIAGNOSIS — R7303 Prediabetes: Secondary | ICD-10-CM

## 2016-12-03 DIAGNOSIS — Z79899 Other long term (current) drug therapy: Secondary | ICD-10-CM

## 2016-12-03 LAB — HEPATIC FUNCTION PANEL
ALBUMIN: 3.9 g/dL (ref 3.6–5.1)
ALK PHOS: 96 U/L (ref 33–130)
ALT: 13 U/L (ref 6–29)
AST: 19 U/L (ref 10–35)
BILIRUBIN DIRECT: 0.1 mg/dL (ref <=0.2)
BILIRUBIN TOTAL: 0.5 mg/dL (ref 0.2–1.2)
Indirect Bilirubin: 0.4 mg/dL (ref 0.2–1.2)
Total Protein: 6.8 g/dL (ref 6.1–8.1)

## 2016-12-03 LAB — CBC WITH DIFFERENTIAL/PLATELET
BASOS PCT: 0 %
Basophils Absolute: 0 cells/uL (ref 0–200)
EOS ABS: 220 {cells}/uL (ref 15–500)
Eosinophils Relative: 2 %
HEMATOCRIT: 40 % (ref 35.0–45.0)
HEMOGLOBIN: 13.2 g/dL (ref 11.7–15.5)
LYMPHS ABS: 2090 {cells}/uL (ref 850–3900)
Lymphocytes Relative: 19 %
MCH: 32.6 pg (ref 27.0–33.0)
MCHC: 33 g/dL (ref 32.0–36.0)
MCV: 98.8 fL (ref 80.0–100.0)
MONO ABS: 660 {cells}/uL (ref 200–950)
MONOS PCT: 6 %
MPV: 9 fL (ref 7.5–12.5)
NEUTROS ABS: 8030 {cells}/uL — AB (ref 1500–7800)
Neutrophils Relative %: 73 %
PLATELETS: 350 10*3/uL (ref 140–400)
RBC: 4.05 MIL/uL (ref 3.80–5.10)
RDW: 13.3 % (ref 11.0–15.0)
WBC: 11 10*3/uL — ABNORMAL HIGH (ref 3.8–10.8)

## 2016-12-03 LAB — BASIC METABOLIC PANEL WITH GFR
BUN: 16 mg/dL (ref 7–25)
CHLORIDE: 94 mmol/L — AB (ref 98–110)
CO2: 26 mmol/L (ref 20–31)
Calcium: 9.4 mg/dL (ref 8.6–10.4)
Creat: 0.94 mg/dL (ref 0.50–0.99)
GFR, Est African American: 72 mL/min (ref >=60)
GFR, Est Non African American: 63 mL/min (ref >=60)
Glucose, Bld: 91 mg/dL (ref 65–99)
POTASSIUM: 4.7 mmol/L (ref 3.5–5.3)
Sodium: 135 mmol/L (ref 135–146)

## 2016-12-03 LAB — LIPID PANEL
CHOL/HDL RATIO: 2.6 ratio (ref <5.0)
Cholesterol: 219 mg/dL — ABNORMAL HIGH (ref <200)
HDL: 83 mg/dL (ref >50)
LDL Cholesterol: 108 mg/dL — ABNORMAL HIGH (ref <100)
Triglycerides: 140 mg/dL (ref <150)
VLDL: 28 mg/dL (ref <30)

## 2016-12-03 LAB — TSH: TSH: 0.05 m[IU]/L — AB

## 2016-12-03 NOTE — Progress Notes (Signed)
Assessment and Plan:  Hypertension:  -Continue medication,  -monitor blood pressure at home.  -Continue DASH diet.   -Reminder to go to the ER if any CP, SOB, nausea, dizziness, severe HA, changes vision/speech, left arm numbness and tingling, and jaw pain.  Cholesterol: -Continue diet and exercise.  -Check cholesterol.   Pre-diabetes: -Continue diet and exercise.  -Check A1C  Vitamin D Def: -check level -continue medications.   Lung cancer -s/p lobectomy  -has recently had some hyponatremia which was concerning -have encouraged patient to get her chest xray this afternoon  Hyponatremia -was Hypervolemic on prior exam and on diuretics and thought to be caused also by poor intake -recheck Bmet  Chronic pain secondary to charcot foot -continue to wean off the norco -can try duexis in flares -tylenol prn  Continue diet and meds as discussed. Further disposition pending results of labs.  HPI 70 y.o. female  presents for 3 month follow up with hypertension, hyperlipidemia, prediabetes and vitamin D.   Her blood pressure has been controlled at home, today their BP is BP: 138/82.   She does not workout. She denies chest pain, shortness of breath, dizziness.   She is on cholesterol medication and denies myalgias. Her cholesterol is at goal. The cholesterol last visit was:   Lab Results  Component Value Date   CHOL 214 (H) 08/27/2016   HDL 105 08/27/2016   LDLCALC 88 08/27/2016   TRIG 105 08/27/2016   CHOLHDL 2.0 08/27/2016     She has been working on diet and exercise for prediabetes, and denies foot ulcerations, hyperglycemia, hypoglycemia , increased appetite, nausea, paresthesia of the feet, polydipsia, polyuria, visual disturbances, vomiting and weight loss. Last A1C in the office was:  Lab Results  Component Value Date   HGBA1C 5.1 03/27/2016    Patient is on Vitamin D supplement.  Lab Results  Component Value Date   VD25OH 9 03/27/2016     She is currently  working on weaning off hydrocodone with Pain Management.  She is doing well and is down to Norco 7.5.  She is feeling good.  She wants to know if she can in the future start taking an antiinflammatory medication to help with her joint pains once she is no longer on norco.  Current Medications:  Current Outpatient Prescriptions on File Prior to Visit  Medication Sig Dispense Refill  . atenolol (TENORMIN) 100 MG tablet TAKE 1 TABLET BY MOUTH DAILY FOR BLOOD PRESSURE 90 tablet 1  . Cholecalciferol (VITAMIN D PO) Take 5,000 Units by mouth 2 (two) times daily.     . diphenhydrAMINE (BENADRYL) 25 MG tablet Take 25 mg by mouth every 6 (six) hours as needed for allergies.     Marland Kitchen enalapril (VASOTEC) 20 MG tablet TAKE ONE TABLET BY MOUTH DAILY 30 tablet 3  . furosemide (LASIX) 40 MG tablet TAKE 1 TABLET BY MOUTH EVERY MORNING-NEEDS OFFICE VISIT WITHIN NEXT THREE MONTHS 90 tablet 0  . gabapentin (NEURONTIN) 300 MG capsule Take 300 mg by mouth 3 (three) times daily. Takes 2 pills at dinner and 3 pills qhs    . HYDROcodone-acetaminophen (NORCO) 7.5-325 MG tablet     . Magnesium 250 MG TABS Take by mouth.    . Multiple Vitamins-Minerals (MULTIVITAMIN PO) Take 1 tablet by mouth at bedtime.     Marland Kitchen nystatin cream (MYCOSTATIN) Apply 1 application topically 2 (two) times daily. 30 g 1  . pravastatin (PRAVACHOL) 40 MG tablet TAKE 1 TABLET BY MOUTH EVERY NIGHT AT  BEDTIME FOR CHOLESTEROL 90 tablet 1   No current facility-administered medications on file prior to visit.     Medical History:  Past Medical History:  Diagnosis Date  . Anemia   . Arthritis   . Gait difficulty    problems with balance  . High blood pressure   . History of blood transfusion    05-14-15  . Hyperlipidemia   . Hyperthyroidism mild  . Neuropathy ([place])   . Obesity (BMI 35.0-39.9 without comorbidity)   . Pre-diabetes   . Vitamin D deficiency     Allergies:  Allergies  Allergen Reactions  . Other Other (See Comments)    Stool  softeners caused stomach bloating, nausea, vomiting bleeding ulcer and blood transfusion     Review of Systems:  Review of Systems  Constitutional: Negative for chills, fever and malaise/fatigue.  HENT: Negative for congestion, ear pain and sore throat.   Eyes: Negative.   Respiratory: Negative for cough, shortness of breath and wheezing.   Cardiovascular: Negative for chest pain, palpitations and leg swelling.  Gastrointestinal: Negative for abdominal pain, blood in stool, constipation, diarrhea, heartburn and melena.  Genitourinary: Negative.   Musculoskeletal: Positive for joint pain.  Skin: Negative.   Neurological: Negative for dizziness, sensory change, loss of consciousness and headaches.  Psychiatric/Behavioral: Negative for depression. The patient is not nervous/anxious and does not have insomnia.     Family history- Review and unchanged  Social history- Review and unchanged  Physical Exam: BP 138/82   Pulse 78   Temp 98 F (36.7 C) (Temporal)   Resp 18   Ht '5\' 6"'$  (1.676 m)   Wt 212 lb (96.2 kg)   BMI 34.22 kg/m  Wt Readings from Last 3 Encounters:  12/03/16 212 lb (96.2 kg)  09/09/16 210 lb (95.3 kg)  08/27/16 202 lb 3.2 oz (91.7 kg)    General Appearance: Well nourished well developed, in no apparent distress. Eyes: PERRLA, EOMs, conjunctiva no swelling or erythema ENT/Mouth: Ear canals normal without obstruction, swelling, erythma, discharge.  TMs normal bilaterally.  Oropharynx moist, clear, without exudate, or postoropharyngeal swelling. Neck: Supple, thyroid normal,no cervical adenopathy  Respiratory: Respiratory effort normal, Breath sounds clear A&P without rhonchi, wheeze, or rale.  No retractions, no accessory usage. Cardio: RRR with no MRGs. Brisk peripheral pulses without edema.  Abdomen: Soft, + BS,  Non tender, no guarding, rebound, hernias, masses. Musculoskeletal: Full ROM, 5/5 strength, Normal gait, charcot foot on the right with pronation.  2+  peripheral edema bilateral lower legs Skin: Warm, dry without rashes, lesions, ecchymosis.  Neuro: Awake and oriented X 3, Cranial nerves intact. Normal muscle tone, no cerebellar symptoms. Psych: Normal affect, Insight and Judgment appropriate.    Starlyn Skeans, PA-C 3:07 PM [facility]

## 2016-12-04 LAB — HEMOGLOBIN A1C
HEMOGLOBIN A1C: 4.9 % (ref <5.7)
MEAN PLASMA GLUCOSE: 94 mg/dL

## 2016-12-30 DIAGNOSIS — M4696 Unspecified inflammatory spondylopathy, lumbar region: Secondary | ICD-10-CM | POA: Diagnosis not present

## 2016-12-30 DIAGNOSIS — G894 Chronic pain syndrome: Secondary | ICD-10-CM | POA: Diagnosis not present

## 2016-12-30 DIAGNOSIS — M5137 Other intervertebral disc degeneration, lumbosacral region: Secondary | ICD-10-CM | POA: Diagnosis not present

## 2016-12-30 DIAGNOSIS — Z79899 Other long term (current) drug therapy: Secondary | ICD-10-CM | POA: Diagnosis not present

## 2016-12-30 DIAGNOSIS — M47817 Spondylosis without myelopathy or radiculopathy, lumbosacral region: Secondary | ICD-10-CM | POA: Diagnosis not present

## 2016-12-30 DIAGNOSIS — Z79891 Long term (current) use of opiate analgesic: Secondary | ICD-10-CM | POA: Diagnosis not present

## 2017-02-02 ENCOUNTER — Other Ambulatory Visit: Payer: Self-pay | Admitting: *Deleted

## 2017-02-02 ENCOUNTER — Other Ambulatory Visit: Payer: Self-pay | Admitting: Internal Medicine

## 2017-02-02 MED ORDER — IBUPROFEN-FAMOTIDINE 800-26.6 MG PO TABS: ORAL_TABLET | ORAL | 1 refills | Status: DC

## 2017-02-03 ENCOUNTER — Other Ambulatory Visit: Payer: Self-pay | Admitting: *Deleted

## 2017-02-03 MED ORDER — MELOXICAM 15 MG PO TABS: 15.0000 mg | ORAL_TABLET | Freq: Every day | ORAL | 0 refills | Status: DC

## 2017-02-03 MED ORDER — RANITIDINE HCL 300 MG PO TABS: 300.0000 mg | ORAL_TABLET | Freq: Every day | ORAL | 1 refills | Status: DC

## 2017-02-19 ENCOUNTER — Other Ambulatory Visit: Payer: Self-pay | Admitting: Internal Medicine

## 2017-02-27 ENCOUNTER — Other Ambulatory Visit: Payer: Self-pay | Admitting: Internal Medicine

## 2017-03-05 ENCOUNTER — Other Ambulatory Visit: Payer: Self-pay | Admitting: Internal Medicine

## 2017-03-27 ENCOUNTER — Ambulatory Visit (HOSPITAL_COMMUNITY)
Admission: RE | Admit: 2017-03-27 | Discharge: 2017-03-27 | Disposition: A | Discharge status: Home / Self Care | Payer: Medicare Other | Source: Ambulatory Visit | Attending: Physician Assistant | Admitting: Physician Assistant

## 2017-03-27 DIAGNOSIS — I10 Essential (primary) hypertension: Secondary | ICD-10-CM

## 2017-03-27 DIAGNOSIS — C3412 Malignant neoplasm of upper lobe, left bronchus or lung: Secondary | ICD-10-CM | POA: Insufficient documentation

## 2017-03-27 DIAGNOSIS — Z87891 Personal history of nicotine dependence: Secondary | ICD-10-CM | POA: Diagnosis not present

## 2017-03-27 DIAGNOSIS — Z9889 Other specified postprocedural states: Secondary | ICD-10-CM | POA: Insufficient documentation

## 2017-03-31 ENCOUNTER — Other Ambulatory Visit: Payer: Self-pay | Admitting: Internal Medicine

## 2017-04-01 ENCOUNTER — Encounter: Payer: Self-pay | Admitting: Internal Medicine

## 2017-04-01 ENCOUNTER — Ambulatory Visit (INDEPENDENT_AMBULATORY_CARE_PROVIDER_SITE_OTHER): Payer: Medicare Other | Admitting: Internal Medicine

## 2017-04-01 VITALS — BP 122/84 | HR 68 | Temp 97.8°F | Resp 16 | Ht 66.0 in | Wt 212.0 lb

## 2017-04-01 DIAGNOSIS — E559 Vitamin D deficiency, unspecified: Secondary | ICD-10-CM

## 2017-04-01 DIAGNOSIS — I10 Essential (primary) hypertension: Secondary | ICD-10-CM | POA: Diagnosis not present

## 2017-04-01 DIAGNOSIS — Z79899 Other long term (current) drug therapy: Secondary | ICD-10-CM

## 2017-04-01 DIAGNOSIS — R7303 Prediabetes: Secondary | ICD-10-CM | POA: Diagnosis not present

## 2017-04-01 DIAGNOSIS — E782 Mixed hyperlipidemia: Secondary | ICD-10-CM | POA: Diagnosis not present

## 2017-04-01 DIAGNOSIS — E0590 Thyrotoxicosis, unspecified without thyrotoxic crisis or storm: Secondary | ICD-10-CM

## 2017-04-01 LAB — HEPATIC FUNCTION PANEL
ALBUMIN: 3.9 g/dL (ref 3.6–5.1)
ALT: 12 U/L (ref 6–29)
AST: 18 U/L (ref 10–35)
Alkaline Phosphatase: 88 U/L (ref 33–130)
Bilirubin, Direct: 0.1 mg/dL (ref <=0.2)
Indirect Bilirubin: 0.3 mg/dL (ref 0.2–1.2)
TOTAL PROTEIN: 6.8 g/dL (ref 6.1–8.1)
Total Bilirubin: 0.4 mg/dL (ref 0.2–1.2)

## 2017-04-01 LAB — LIPID PANEL
Cholesterol: 186 mg/dL (ref <200)
HDL: 76 mg/dL (ref >50)
LDL Cholesterol: 85 mg/dL (ref <100)
TRIGLYCERIDES: 125 mg/dL (ref <150)
Total CHOL/HDL Ratio: 2.4 Ratio (ref <5.0)
VLDL: 25 mg/dL (ref <30)

## 2017-04-01 LAB — BASIC METABOLIC PANEL WITH GFR
BUN: 17 mg/dL (ref 7–25)
CALCIUM: 9.2 mg/dL (ref 8.6–10.4)
CO2: 26 mmol/L (ref 20–31)
CREATININE: 1.09 mg/dL — AB (ref 0.50–0.99)
Chloride: 100 mmol/L (ref 98–110)
GFR, EST AFRICAN AMERICAN: 60 mL/min (ref >=60)
GFR, EST NON AFRICAN AMERICAN: 52 mL/min — AB (ref >=60)
Glucose, Bld: 107 mg/dL — ABNORMAL HIGH (ref 65–99)
Potassium: 3.8 mmol/L (ref 3.5–5.3)
Sodium: 139 mmol/L (ref 135–146)

## 2017-04-01 LAB — CBC WITH DIFFERENTIAL/PLATELET
BASOS ABS: 78 {cells}/uL (ref 0–200)
Basophils Relative: 1 %
Eosinophils Absolute: 234 cells/uL (ref 15–500)
Eosinophils Relative: 3 %
HCT: 39.3 % (ref 35.0–45.0)
Hemoglobin: 12.9 g/dL (ref 11.7–15.5)
LYMPHS ABS: 2418 {cells}/uL (ref 850–3900)
LYMPHS PCT: 31 %
MCH: 32 pg (ref 27.0–33.0)
MCHC: 32.8 g/dL (ref 32.0–36.0)
MCV: 97.5 fL (ref 80.0–100.0)
MPV: 9.3 fL (ref 7.5–12.5)
Monocytes Absolute: 546 cells/uL (ref 200–950)
Monocytes Relative: 7 %
NEUTROS PCT: 58 %
Neutro Abs: 4524 cells/uL (ref 1500–7800)
Platelets: 340 10*3/uL (ref 140–400)
RBC: 4.03 MIL/uL (ref 3.80–5.10)
RDW: 13.7 % (ref 11.0–15.0)
WBC: 7.8 10*3/uL (ref 3.8–10.8)

## 2017-04-01 LAB — TSH: TSH: 0.08 mIU/L — ABNORMAL LOW

## 2017-04-01 NOTE — Patient Instructions (Signed)

## 2017-04-01 NOTE — Progress Notes (Signed)
This very nice 71 y.o. MWF presents for 3 month follow up with Hypertension, Hyperlipidemia, Pre-Diabetes and Vitamin D Deficiency.      Patient is treated for HTN (1998) & BP has been controlled at home. Today's BP is at goal - 122/84. Patient has had no complaints of any cardiac type chest pain, palpitations, dyspnea/orthopnea/PND, dizziness, claudication, or dependent edema.     Hyperlipidemia is controlled with diet & meds. Patient denies myalgias or other med SE's. Last Lipids were not at goal: Lab Results  Component Value Date   CHOL 219 (H) 12/03/2016   HDL 83 12/03/2016   LDLCALC 108 (H) 12/03/2016   TRIG 140 12/03/2016   CHOLHDL 2.6 12/03/2016      Also, the patient has history of Morbid Obesity (BMI 34+) and is expectantly screened for PreDiabetes/insulin resistance with Normal A1c 5.3% and elevated Insulin 40 in 2012 and has had no symptoms of reactive hypoglycemia, diabetic polys, paresthesias or visual blurring.  Last A1c was at goal: Lab Results  Component Value Date   HGBA1C 4.9 12/03/2016      Further, the patient also has history of Vitamin D Deficiency ("41" on treatment in 2009)  and supplements vitamin D without any suspected side-effects. Last vitamin D was at goal: Lab Results  Component Value Date   VD25OH 58 03/27/2016   Current Outpatient Prescriptions on File Prior to Visit  Medication Sig  . atenolol 100 MG tablet TAKE 1 TAB DAILY FOR BLOOD PRESSURE  . VITAMIN D 5,000 Units  Take  2 x daily.   . diphenhydrAMINE 25 MG  Take  every 6  hrs as needed for allergies.   Marland Kitchen enalapril  20 MG tablet TAKE ONE TAB DAILY  . Furosemide 40 MG tablet TAKE 1 TAB EVERY MORNING  . gabapentin  300 MG capsule Takes 2 pills at dinner and 3 pills qhs  . NORCO 7.5-325 As needed for severe pain  . Magnesium 250 MG Take daily  . meloxicam  15 MG tablet TAKE ONE TAB DAILY  . Multiple Vitamins-Minerals  Take 1 tab bedtime.   Marland Kitchen nystatin cream Apply 1 application topically 2 x  daily.  . pravastatin  40 MG tablet TAKE ONE TAB EVERY NIGHT AT BEDTIME  . ranitidine  300 MG tablet TAKE ONE TAB AT BEDTIME   Allergies  Allergen Reactions  . Other Other (See Comments)    Stool softeners caused stomach bloating, nausea, vomiting bleeding ulcer and blood transfusion   PMHx:   Past Medical History:  Diagnosis Date  . Anemia   . Arthritis   . Gait difficulty    problems with balance  . High blood pressure   . History of blood transfusion    05-14-15  . Hyperlipidemia   . Hyperthyroidism mild  . Neuropathy ([place])   . Obesity (BMI 35.0-39.9 without comorbidity)   . Pre-diabetes   . Vitamin D deficiency     Past Surgical History:  Procedure Laterality Date  . CERVICAL DISC ARTHROPLASTY    . CRYO INTERCOSTAL NERVE BLOCK Left 05/09/2015   Procedure: CRYO INTERCOSTAL NERVE BLOCK;  Surgeon: [name], MD;  Location: [place];  Service: Thoracic;  Laterality: Left;  . ESOPHAGOGASTRODUODENOSCOPY N/A 05/16/2015   Procedure: ESOPHAGOGASTRODUODENOSCOPY (EGD);  Surgeon: Irene Shipper, MD;  Location: [facility] ENDOSCOPY;  Service: Endoscopy;  Laterality: N/A;  possible ablation of a bleeding lesion  . knee replaced     right knee x 2  .  LOBECTOMY Left 05/09/2015   Procedure: LEFT LUNG UPPER LOBECTOMY;  Surgeon: [name], MD;  Location: [place];  Service: Thoracic;  Laterality: Left;  . LYMPH NODE DISSECTION Left 05/09/2015   Procedure: LYMPH NODE DISSECTION;  Surgeon: [name], MD;  Location: [place];  Service: Thoracic;  Laterality: Left;  . ROTATOR CUFF REPAIR Right   . SPINAL FUSION    . TONSILLECTOMY  age 62  . TOTAL HIP ARTHROPLASTY Left 07/13/2015   Procedure: LEFT TOTAL HIP ARTHROPLASTY ANTERIOR APPROACH;  Surgeon: Mcarthur Rossetti, MD;  Location: [facility];  Service: Orthopedics;  Laterality: Left;  Marland Kitchen VAGINAL HYSTERECTOMY    . VIDEO ASSISTED THORACOSCOPY (VATS)/WEDGE RESECTION Left 05/09/2015   Procedure: LEFT VIDEO ASSISTED THORACOSCOPY (VATS)  WITH LEFT LUNG UPPER LOBE WEDGE RESECTION;  Surgeon: [name], MD  . VIDEO BRONCHOSCOPY WITH ENDOBRONCHIAL NAVIGATION N/A 04/18/2015   Procedure: VIDEO BRONCHOSCOPY WITH ENDOBRONCHIAL NAVIGATION;  Surgeon: Collene Gobble, MD  . VIDEO BRONCHOSCOPY WITH ENDOBRONCHIAL ULTRASOUND N/A 04/18/2015   Procedure: VIDEO BRONCHOSCOPY WITH ENDOBRONCHIAL ULTRASOUND;  Surgeon: Collene Gobble, MD   FHx:    Reviewed / unchanged  SHx:    Reviewed / unchanged  Systems Review:  Constitutional: Denies fever, chills, wt changes, headaches, insomnia, fatigue, night sweats, change in appetite. Eyes: Denies redness, blurred vision, diplopia, discharge, itchy, watery eyes.  ENT: Denies discharge, congestion, post nasal drip, epistaxis, sore throat, earache, hearing loss, dental pain, tinnitus, vertigo, sinus pain, snoring.  CV: Denies chest pain, palpitations, irregular heartbeat, syncope, dyspnea, diaphoresis, orthopnea, PND, claudication or edema. Respiratory: denies cough, dyspnea, DOE, pleurisy, hoarseness, laryngitis, wheezing.  Gastrointestinal: Denies dysphagia, odynophagia, heartburn, reflux, water brash, abdominal pain or cramps, nausea, vomiting, bloating, diarrhea, constipation, hematemesis, melena, hematochezia  or hemorrhoids. Genitourinary: Denies dysuria, frequency, urgency, nocturia, hesitancy, discharge, hematuria or flank pain. Musculoskeletal: Denies arthralgias, myalgias, stiffness, jt. swelling, pain, limping or strain/sprain.  Skin: Denies pruritus, rash, hives, warts, acne, eczema or change in skin lesion(s). Neuro: No weakness, tremor, incoordination, spasms, paresthesia or pain. Psychiatric: Denies confusion, memory loss or sensory loss. Endo: Denies change in weight, skin or hair change.  Heme/Lymph: No excessive bleeding, bruising or enlarged lymph nodes.  Physical Exam  BP 122/84   Pulse 68   Temp 97.8 F (36.6 C)   Resp 16   Ht '5\' 6"'$  (1.676 m)   Wt 212 lb (96.2 kg)    BMI 34.22 kg/m   Appears well nourished, well groomed  and in no distress.  Eyes: PERRLA, EOMs, conjunctiva no swelling or erythema. Sinuses: No frontal/maxillary tenderness ENT/Mouth: EAC's clear, TM's nl w/o erythema, bulging. Nares clear w/o erythema, swelling, exudates. Oropharynx clear without erythema or exudates. Oral hygiene is good. Tongue normal, non obstructing. Hearing intact.  Neck: Supple. Thyroid nl. Car 2+/2+ without bruits, nodes or JVD. Chest: Respirations nl with BS clear & equal w/o rales, rhonchi, wheezing or stridor.  Cor: Heart sounds normal w/ regular rate and rhythm without sig. murmurs, gallops, clicks or rubs. Peripheral pulses normal and equal  without edema.  Abdomen: Soft & bowel sounds normal. Non-tender w/o guarding, rebound, hernias, masses or organomegaly.  Lymphatics: Unremarkable.  Musculoskeletal: Full ROM all peripheral extremities, joint stability, 5/5 strength and gait is broad based  and stabilized with a walker.   Skin: Warm, dry without exposed rashes, lesions or ecchymosis apparent.  Neuro: Cranial nerves intact, reflexes equal bilaterally. Sensory-motor testing grossly intact. Tendon reflexes grossly intact. High frequency low amplitude action tremor of the  hands. Pysch: Alert & oriented x 3.  Insight and judgement nl & appropriate. No ideations.  Assessment and Plan:  1. Essential hypertension  - Continue medication, monitor blood pressure at home.  - Continue DASH diet. Reminder to go to the ER if any CP,  SOB, nausea, dizziness, severe HA, changes vision/speech,  left arm numbness and tingling and jaw pain.  - CBC with Differential/Platelet - BASIC METABOLIC PANEL WITH GFR - Magnesium - TSH  2. Mixed hyperlipidemia  - Continue diet/meds, exercise,& lifestyle modifications.  - Continue monitor periodic cholesterol/liver & renal functions   - Hepatic function panel - Lipid panel - TSH  3. Prediabetes  - Continue diet, exercise,  lifestyle modifications.  - Monitor appropriate labs.  - Hemoglobin A1c - Insulin, random  4. Vitamin D deficiency  - Continue supplementation. - VITAMIN D 25 Hydroxy   5. Subclinical hyperthyroidism  - TSH  6. Medication management  - CBC with Differential/Platelet - BASIC METABOLIC PANEL WITH GFR - Hepatic function panel - Magnesium - Lipid panel - TSH - Hemoglobin A1c - Insulin, random - VITAMIN D 25 Hydroxy       Discussed  regular exercise, BP monitoring, weight control to achieve/maintain BMI less than 25 and discussed med and SE's. Recommended labs to assess and monitor clinical status with further disposition pending results of labs. Over 30 minutes of exam, counseling, chart review was performed.

## 2017-04-02 LAB — MAGNESIUM: MAGNESIUM: 1.9 mg/dL (ref 1.5–2.5)

## 2017-04-02 LAB — HEMOGLOBIN A1C
HEMOGLOBIN A1C: 4.8 % (ref <5.7)
MEAN PLASMA GLUCOSE: 91 mg/dL

## 2017-04-02 LAB — INSULIN, RANDOM: INSULIN: 35.4 u[IU]/mL — AB (ref 2.0–19.6)

## 2017-04-02 LAB — VITAMIN D 25 HYDROXY (VIT D DEFICIENCY, FRACTURES): Vit D, 25-Hydroxy: 94 ng/mL (ref 30–100)

## 2017-05-07 ENCOUNTER — Other Ambulatory Visit: Payer: Self-pay | Admitting: Physician Assistant

## 2017-05-28 ENCOUNTER — Other Ambulatory Visit: Payer: Self-pay | Admitting: Physician Assistant

## 2017-06-04 ENCOUNTER — Other Ambulatory Visit: Payer: Self-pay | Admitting: Internal Medicine

## 2017-06-04 ENCOUNTER — Other Ambulatory Visit: Payer: Self-pay | Admitting: Physician Assistant

## 2017-07-09 ENCOUNTER — Ambulatory Visit: Payer: Self-pay | Admitting: Physician Assistant

## 2017-07-09 ENCOUNTER — Other Ambulatory Visit: Payer: Self-pay | Admitting: Physician Assistant

## 2017-08-04 ENCOUNTER — Ambulatory Visit: Payer: Self-pay | Admitting: Physician Assistant

## 2017-08-08 ENCOUNTER — Other Ambulatory Visit: Payer: Self-pay | Admitting: Physician Assistant

## 2017-09-06 ENCOUNTER — Other Ambulatory Visit: Payer: Self-pay | Admitting: Physician Assistant

## 2017-09-06 ENCOUNTER — Other Ambulatory Visit: Payer: Self-pay | Admitting: Internal Medicine

## 2017-09-10 NOTE — Progress Notes (Signed)
MEDICARE ANNUAL WELLNESS VISIT AND FOLLOW UP  Assessment:   Essential hypertension - continue medications, DASH diet, exercise and monitor at home. Call if greater than 130/80.  -     CBC with Differential/Platelet -     BASIC METABOLIC PANEL WITH GFR -     Hepatic function panel -     TSH -     Urinalysis, Routine w reflex microscopic (not at [facility]) -     Microalbumin / creatinine urine ratio -     EKG 12-Lead  Malignant neoplasm of upper lobe of left lung ([place]) -     Continue follow up  Subclinical hyperthyroidism -     TSH - will continue to monitor, no symptoms, has had normal RAI  Gastroesophageal reflux disease, esophagitis presence not specified Continue PPI/H2 blocker, diet discussed  Gastric ulcer due to nonsteroidal antiinflammatory drug (NSAID) therapy Avoid NSAIDS  Charcot's Right foot Follows pain management  Vitamin D deficiency Continue supplement  Peripheral edema Continue lasix  Medication management -     Magnesium  Hyperlipidemia -     Lipid panel -continue medications, check lipids, decrease fatty foods, increase activity.   Encounter for Medicare annual wellness exam  Need for vaccination for H flu type B -     Flu vaccine HIGH DOSE PF  Other polyneuropathy ([place]) No meds at this time  Advanced care counseling/discussion Discussed advanced care planning with patient and/or family At least 30 mins discussed with the patient and/or family at the visit.   Over 40 minutes of exam, counseling, chart review and critical decision making was performed Future Appointments Date Time Provider [place]  09/16/2018 2:00 PM Vicie Mutters, PA-C [facility] None     Plan:   During the course of the visit the patient was educated and counseled about appropriate screening and preventive services including:    Pneumococcal vaccine   Prevnar 13  Influenza vaccine  Td vaccine  Screening electrocardiogram  Bone densitometry  screening  Colorectal cancer screening  Diabetes screening  Glaucoma screening  Nutrition counseling   Advanced directives: requested   Subjective:  Tiffany [name] is a 71 y.o. female who presents for Medicare Annual Wellness Visit and follow up.    Her blood pressure has been controlled at home, today their BP is BP: 136/80 She does not workout. She denies chest pain, shortness of breath, dizziness.  Patient had VATS May 2016 by Dr. Roxan Hockey for LUL primary adenocarcinoma, no chemo/radiation.  She had hip replacement last year, walks with cane for balance occ a walker, was on hydrocodone but had procedures that helped and now no longer on pain medications. No falls.  She is on cholesterol medication and denies myalgias. Her cholesterol is at goal. The cholesterol last visit was:   Lab Results  Component Value Date   CHOL 186 04/01/2017   HDL 76 04/01/2017   LDLCALC 85 04/01/2017   TRIG 125 04/01/2017   CHOLHDL 2.4 04/01/2017   Last A1C Lab Results  Component Value Date   HGBA1C 4.8 04/01/2017   Last GFR: Lab Results  Component Value Date   GFRNONAA 52 (L) 04/01/2017   Patient is on Vitamin D supplement.   Lab Results  Component Value Date   VD25OH 94 04/01/2017     BMI is Body mass index is 33.6 kg/m., she is working on diet and exercise. Wt Readings from Last 3 Encounters:  09/14/17 208 lb 3.2 oz (94.4 kg)  04/01/17 212 lb (96.2 kg)  12/03/16 212 lb (96.2 kg)   Medication Review: Current Outpatient Prescriptions on File Prior to Visit  Medication Sig Dispense Refill  . atenolol (TENORMIN) 100 MG tablet TAKE 1 TABLET BY MOUTH DAILY FOR BLOOD PRESSURE 90 tablet 1  . Cholecalciferol (VITAMIN D PO) Take 5,000 Units by mouth 2 (two) times daily.     . diphenhydrAMINE (BENADRYL) 25 MG tablet Take 25 mg by mouth every 6 (six) hours as needed for allergies.     Marland Kitchen enalapril (VASOTEC) 20 MG tablet TAKE ONE TABLET BY MOUTH DAILY 90 tablet 1  . furosemide (LASIX) 40  MG tablet TAKE ONE TABLET BY MOUTH EVERY MORNING *NEED OFFICE VISIT WITHIN NEXT 3 MONTHS* 30 tablet 0  . Magnesium 250 MG TABS Take by mouth.    . meloxicam (MOBIC) 15 MG tablet Take 1 tablet daily with food for pain & Inflammation 90 tablet 1  . Multiple Vitamins-Minerals (MULTIVITAMIN PO) Take 1 tablet by mouth at bedtime.     Marland Kitchen nystatin cream (MYCOSTATIN) Apply 1 application topically 2 (two) times daily. 30 g 1  . pravastatin (PRAVACHOL) 40 MG tablet TAKE ONE TABLET BY MOUTH EVERY NIGHT AT BEDTIME 90 tablet 1  . ranitidine (ZANTAC) 300 MG tablet Take 1 tablet daily for acid indigestion & reflux 90 tablet 1   No current facility-administered medications on file prior to visit.     Allergies  Allergen Reactions  . Other Other (See Comments)    Stool softeners caused stomach bloating, nausea, vomiting bleeding ulcer and blood transfusion    Current Problems (verified) Patient Active Problem List   Diagnosis Date Noted  . Encounter for Medicare annual wellness exam 08/27/2016  . Peripheral neuropathy ([place]) 08/27/2016  . GERD (gastroesophageal reflux disease) 03/27/2016  . Subclinical hyperthyroidism 08/18/2015  . 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 Gastric ulcer due to nonsteroidal antiinflammatory drug (NSAID) therapy   . Lung cancer ([place]) 05/09/2015  . Peripheral edema 05/01/2015  . Vitamin D deficiency 07/18/2014  . Medication management 07/18/2014  . Hyperlipidemia 04/04/2014  . Charcot's Right foot 06/15/2013  . Hypertension     Screening Tests Immunization History  Administered Date(s) Administered  . Influenza Split 11/22/2013  . Influenza, High Dose Seasonal PF 11/29/2015, 08/27/2016, 09/14/2017  . Pneumococcal Conjugate-13 03/27/2016  . Pneumococcal Polysaccharide-23 10/01/1999, 09/14/2017  . Tdap 05/29/2008  . Zoster 10/09/2011   Preventative care: Last colonoscopy: 2008 declines, willing to do cologuard EGD 2016 PFTs 03/2015 Last  mammogram: 10/2014 DUE CXR 02/2017 due Last pap smear/pelvic exam: remote  DEXA:10/2014 Thyroid 2011  Prior vaccinations: TD or Tdap: 2009  Influenza: 2018 TODAY Pneumococcal: 2018 Prevnar13: 2017 Shingles/Zostavax: 2012  Names of Other Physician/Practitioners you currently use: 1. [facility] here for primary care 2. Sabra Heck, eye doctor, last visit 2014 3. Lilia Pro dentist, last visit 2017 Patient Care Team: Unk Pinto, MD as PCP - General (Internal Medicine) Mcarthur Rossetti, MD as Consulting Physician (Orthopedic Surgery) [name], MD as Consulting Physician (Cardiothoracic Surgery) Collene Gobble, MD as Consulting Physician (Pulmonary Disease) Marlou Sa Tonna Corner, MD as Consulting Physician (Orthopedic Surgery) Irene Shipper, MD as Consulting Physician (Gastroenterology)  SURGICAL HISTORY She  has a past surgical history that includes Vaginal hysterectomy; Cervical disc arthroplasty; Rotator cuff repair (Right); Spinal fusion; knee replaced; Video bronchoscopy with endobronchial navigation (N/A, 04/18/2015); Video bronchoscopy with endobronchial ultrasound (N/A, 04/18/2015); Video assisted thoracoscopy (vats)/wedge resection (Left, 05/09/2015); Lobectomy (Left, 05/09/2015); Cryo intercostal nerve block (  Left, 05/09/2015); Lymph node dissection (Left, 05/09/2015); Esophagogastroduodenoscopy (N/A, 05/16/2015); Tonsillectomy (age 80); and Total hip arthroplasty (Left, 07/13/2015). FAMILY HISTORY Her family history includes Breast cancer in her maternal aunt; Colon cancer in her paternal grandmother; Heart attack in her father; Hypertension in her mother. SOCIAL HISTORY She  reports that she quit smoking about 2 years ago. Her smoking use included Cigarettes. She has a 30.00 pack-year smoking history. She has never used smokeless tobacco. She reports that she drinks about 0.6 oz of alcohol per week . She reports that she does not use  drugs.  MEDICARE WELLNESS OBJECTIVES: Physical activity: Current Exercise Habits: The patient does not participate in regular exercise at present, Exercise limited by: orthopedic condition(s) Cardiac risk factors: Cardiac Risk Factors include: advanced age (>47men, >74 women);dyslipidemia;hypertension;obesity (BMI >30kg/m2);sedentary lifestyle Depression/mood screen:   Depression screen [facility] 2/9 09/14/2017  Decreased Interest 0  Down, Depressed, Hopeless 0  PHQ - 2 Score 0    ADLs:  In your present state of health, do you have any difficulty performing the following activities: 09/14/2017 04/01/2017  Hearing? N N  Vision? N N  Difficulty concentrating or making decisions? N N  Walking or climbing stairs? Y N  Dressing or bathing? N N  Doing errands, shopping? N N  Preparing Food and eating ? N -  Using the Toilet? N -  In the past six months, have you accidently leaked urine? N -  Do you have problems with loss of bowel control? N -  Managing your Medications? N -  Managing your Finances? N -  Housekeeping or managing your Housekeeping? N -  Some recent data might be hidden    Cognitive Testing  Alert? Yes  Normal Appearance?Yes  Oriented to person? Yes  Place? Yes   Time? Yes  Recall of three objects?  Yes  Can perform simple calculations? Yes  Displays appropriate judgment?Yes  Can read the correct time from a watch face?Yes  EOL planning: Does Patient Have a Medical Advance Directive?: No Would patient like information on creating a medical advance directive?: No - Patient declined  Review of Systems  Constitutional: Negative for fever.  HENT: Negative for congestion, ear pain and sore throat.   Eyes: Negative.   Respiratory: Negative for cough, shortness of breath and wheezing.   Cardiovascular: Positive for leg swelling. Negative for chest pain and palpitations.  Gastrointestinal: Negative for blood in stool, constipation, diarrhea, heartburn and melena.  Genitourinary:  Negative.   Musculoskeletal: Positive for back pain and joint pain. Negative for falls.  Skin: Negative.   Neurological: Positive for sensory change (bilateral feet). Negative for dizziness, loss of consciousness and headaches.  Psychiatric/Behavioral: Negative for depression. The patient is not nervous/anxious and does not have insomnia.      Objective:     Today's Vitals   09/14/17 1356  BP: 136/80  Pulse: 80  Resp: 14  Temp: (!) 97.4 F (36.3 C)  SpO2: 96%  Weight: 208 lb 3.2 oz (94.4 kg)  Height: 5\' 6"  (1.676 m)  PainSc: 0-No pain   Body mass index is 33.6 kg/m.  General appearance: alert, no distress, WD/WN, female HEENT: normocephalic, sclerae anicteric, TMs pearly, nares patent, no discharge or erythema, pharynx normal Oral cavity: MMM, no lesions Neck: supple, no lymphadenopathy, no thyromegaly, no masses Heart: RRR, normal S1, S2, no murmurs Lungs: CTA bilaterally, no wheezes, rhonchi, or rales Abdomen: +bs, soft, non tender, non distended, no masses, no hepatomegaly, no splenomegaly Musculoskeletal: nontender, no swelling, no  obvious deformity Extremities: no edema, no cyanosis, no clubbing Pulses: 2+ symmetric, upper and lower extremities, normal cap refill Neurological: alert, oriented x 3, CN2-12 intact, strength normal upper extremities and lower extremities, sensation normal throughout, DTRs 2+ throughout, no cerebellar signs, gait imbalanced, walks with cain Psychiatric: normal affect, behavior normal, pleasant   Medicare Attestation I have personally reviewed: The patient's medical and social history Their use of alcohol, tobacco or illicit drugs Their current medications and supplements The patient's functional ability including ADLs,fall risks, home safety risks, cognitive, and hearing and visual impairment Diet and physical activities Evidence for depression or mood disorders  The patient's weight, height, BMI, and visual acuity have been recorded in  the chart.  I have made referrals, counseling, and provided education to the patient based on review of the above and I have provided the patient with a written personalized care plan for preventive services.     Vicie Mutters, PA-C   09/14/2017

## 2017-09-14 ENCOUNTER — Encounter: Payer: Self-pay | Admitting: Physician Assistant

## 2017-09-14 ENCOUNTER — Ambulatory Visit (INDEPENDENT_AMBULATORY_CARE_PROVIDER_SITE_OTHER): Payer: Medicare Other | Admitting: Physician Assistant

## 2017-09-14 VITALS — BP 136/80 | HR 80 | Temp 97.4°F | Resp 14 | Ht 66.0 in | Wt 208.2 lb

## 2017-09-14 DIAGNOSIS — Z0000 Encounter for general adult medical examination without abnormal findings: Secondary | ICD-10-CM

## 2017-09-14 DIAGNOSIS — G6289 Other specified polyneuropathies: Secondary | ICD-10-CM

## 2017-09-14 DIAGNOSIS — K259 Gastric ulcer, unspecified as acute or chronic, without hemorrhage or perforation: Secondary | ICD-10-CM | POA: Diagnosis not present

## 2017-09-14 DIAGNOSIS — R6889 Other general symptoms and signs: Secondary | ICD-10-CM

## 2017-09-14 DIAGNOSIS — C3412 Malignant neoplasm of upper lobe, left bronchus or lung: Secondary | ICD-10-CM | POA: Diagnosis not present

## 2017-09-14 DIAGNOSIS — Z96642 Presence of left artificial hip joint: Secondary | ICD-10-CM

## 2017-09-14 DIAGNOSIS — E559 Vitamin D deficiency, unspecified: Secondary | ICD-10-CM

## 2017-09-14 DIAGNOSIS — Z23 Encounter for immunization: Secondary | ICD-10-CM

## 2017-09-14 DIAGNOSIS — E0590 Thyrotoxicosis, unspecified without thyrotoxic crisis or storm: Secondary | ICD-10-CM | POA: Diagnosis not present

## 2017-09-14 DIAGNOSIS — R600 Localized edema: Secondary | ICD-10-CM

## 2017-09-14 DIAGNOSIS — K219 Gastro-esophageal reflux disease without esophagitis: Secondary | ICD-10-CM | POA: Diagnosis not present

## 2017-09-14 DIAGNOSIS — M14671 Charcot's joint, right ankle and foot: Secondary | ICD-10-CM | POA: Diagnosis not present

## 2017-09-14 DIAGNOSIS — Z7189 Other specified counseling: Secondary | ICD-10-CM

## 2017-09-14 DIAGNOSIS — Z79899 Other long term (current) drug therapy: Secondary | ICD-10-CM | POA: Diagnosis not present

## 2017-09-14 DIAGNOSIS — I10 Essential (primary) hypertension: Secondary | ICD-10-CM

## 2017-09-14 DIAGNOSIS — T39395A Adverse effect of other nonsteroidal anti-inflammatory drugs [NSAID], initial encounter: Secondary | ICD-10-CM | POA: Diagnosis not present

## 2017-09-14 DIAGNOSIS — E782 Mixed hyperlipidemia: Secondary | ICD-10-CM

## 2017-09-14 DIAGNOSIS — R609 Edema, unspecified: Secondary | ICD-10-CM

## 2017-09-14 DIAGNOSIS — Z0001 Encounter for general adult medical examination with abnormal findings: Secondary | ICD-10-CM

## 2017-09-14 NOTE — Patient Instructions (Addendum)
[facility]  7 a.m.-6:30 p.m., Monday 7 a.m.-5 p.m., Tuesday-Friday Schedule an appointment by calling [phone].   Encourage you to get the 3D Mammogram  The 3D Mammogram is much more specific and sensitive to pick up breast cancer. For women with fibrocystic breast or lumpy breast it can be hard to determine if it is cancer or not but the 3D mammogram is able to tell this difference which cuts back on unneeded additional tests or scary call backs.   - over 40% increase in detection of breast cancer - over 40% reduction in false positives.  - fewer call backs - reduced anxiety - improved outcomes - [place] [facility]   Cologuard is an easy to use noninvasive colon cancer screening test based on the latest advances in stool DNA science.   Colon cancer is 3rd most diagnosed cancer and 2nd leading cause of death in both men and women 86 years of age and older despite being one of the most preventable and treatable cancers if found early.  4 of out 5 people diagnosed with colon cancer have NO prior family history.  When caught EARLY 90% of colon cancer is curable.   You have agreed to do a Cologuard screening and have declined a colonoscopy in spite of being explained the risks and benefits of the colonoscopy in detail, including cancer and death. Please understand that this is test not as sensitive or specific as a colonoscopy and you are still recommended to get a colonoscopy.   If you are NOT medicare please call your insurance company and give them these items to see if they will cover it: 1) CPT code, 6134435245 2) Provider is Probation officer 3) [employer] NPI [id number] 4) [place] Tax ID [id number]  Out-of-pocket cost for Cologuard can range from $0 - $649 so please call  You will receive a short call from [place] support center at [employer], when you receive a call they will say they are from [place],  to confirm your mailing address and give you more information.  When they calll you, it will appear on the caller ID as "[employer]" or in some cases only this number will appear, [phone].   [employer] will ship your collection kit directly to you. You will collect a single stool sample in the privacy of your own home, no special preparation required. You will return the kit via [place] pre-paid shipping or pick-up, in the same box it arrived in. Then I will contact you to discuss your results after I receive them from the laboratory.   If you have any questions or concerns, Cologuard Customer Support Specialist are available 24 hours a day, 7 days a week at [phone] or go to [email or web address].         Bad carbs also include fruit juice, alcohol, and sweet tea. These are empty calories that do not signal to your brain that you are full.   Please remember the good carbs are still carbs which convert into sugar. So please measure them out no more than 1/2-1 cup of rice, oatmeal, pasta, and beans  Veggies are however free foods! Pile them on.   Not all fruit is created equal. Please see the list below, the fruit at the bottom is higher in sugars than the fruit at the top. Please avoid all dried fruits.

## 2017-09-15 LAB — CBC WITH DIFFERENTIAL/PLATELET
BASOS ABS: 59 {cells}/uL (ref 0–200)
Basophils Relative: 0.6 %
EOS PCT: 0 %
Eosinophils Absolute: 0 cells/uL — ABNORMAL LOW (ref 15–500)
HEMATOCRIT: 41.1 % (ref 35.0–45.0)
Hemoglobin: 13.8 g/dL (ref 11.7–15.5)
Lymphs Abs: 1813 cells/uL (ref 850–3900)
MCH: 32.2 pg (ref 27.0–33.0)
MCHC: 33.6 g/dL (ref 32.0–36.0)
MCV: 96 fL (ref 80.0–100.0)
MONOS PCT: 7.4 %
MPV: 9.7 fL (ref 7.5–12.5)
Neutro Abs: 7203 cells/uL (ref 1500–7800)
Neutrophils Relative %: 73.5 %
Platelets: 359 10*3/uL (ref 140–400)
RBC: 4.28 10*6/uL (ref 3.80–5.10)
RDW: 12.3 % (ref 11.0–15.0)
TOTAL LYMPHOCYTE: 18.5 %
WBC mixed population: 725 cells/uL (ref 200–950)
WBC: 9.8 10*3/uL (ref 3.8–10.8)

## 2017-09-15 LAB — HEPATIC FUNCTION PANEL
AG RATIO: 1.3 (calc) (ref 1.0–2.5)
ALBUMIN MSPROF: 4.2 g/dL (ref 3.6–5.1)
ALT: 14 U/L (ref 6–29)
AST: 20 U/L (ref 10–35)
Alkaline phosphatase (APISO): 120 U/L (ref 33–130)
BILIRUBIN DIRECT: 0.2 mg/dL (ref 0.0–0.2)
BILIRUBIN INDIRECT: 0.5 mg/dL (ref 0.2–1.2)
BILIRUBIN TOTAL: 0.7 mg/dL (ref 0.2–1.2)
GLOBULIN: 3.2 g/dL (ref 1.9–3.7)
Total Protein: 7.4 g/dL (ref 6.1–8.1)

## 2017-09-15 LAB — MAGNESIUM: MAGNESIUM: 2.3 mg/dL (ref 1.5–2.5)

## 2017-09-15 LAB — BASIC METABOLIC PANEL WITH GFR
BUN / CREAT RATIO: 12 (calc) (ref 6–22)
BUN: 12 mg/dL (ref 7–25)
CHLORIDE: 93 mmol/L — AB (ref 98–110)
CO2: 27 mmol/L (ref 20–32)
Calcium: 9.6 mg/dL (ref 8.6–10.4)
Creat: 1.04 mg/dL — ABNORMAL HIGH (ref 0.50–0.99)
GFR, EST AFRICAN AMERICAN: 64 mL/min/{1.73_m2} (ref >=60)
GFR, EST NON AFRICAN AMERICAN: 55 mL/min/{1.73_m2} — AB (ref >=60)
Glucose, Bld: 102 mg/dL — ABNORMAL HIGH (ref 65–99)
Potassium: 4.2 mmol/L (ref 3.5–5.3)
Sodium: 133 mmol/L — ABNORMAL LOW (ref 135–146)

## 2017-09-15 LAB — LIPID PANEL
CHOL/HDL RATIO: 2.3 (calc) (ref <5.0)
CHOLESTEROL: 206 mg/dL — AB (ref <200)
HDL: 90 mg/dL (ref >50)
LDL CHOLESTEROL (CALC): 89 mg/dL
NON-HDL CHOLESTEROL (CALC): 116 mg/dL (ref <130)
Triglycerides: 174 mg/dL — ABNORMAL HIGH (ref <150)

## 2017-09-15 LAB — IRON, TOTAL/TOTAL IRON BINDING CAP
%SAT: 32 % (calc) (ref 11–50)
IRON: 103 ug/dL (ref 45–160)
TIBC: 318 ug/dL (ref 250–450)

## 2017-09-15 LAB — TSH: TSH: 0.1 mIU/L — ABNORMAL LOW (ref 0.40–4.50)

## 2017-10-15 ENCOUNTER — Other Ambulatory Visit: Payer: Self-pay | Admitting: Internal Medicine

## 2017-11-06 ENCOUNTER — Other Ambulatory Visit: Payer: Self-pay | Admitting: Internal Medicine

## 2017-11-17 ENCOUNTER — Other Ambulatory Visit: Payer: Self-pay | Admitting: *Deleted

## 2017-11-17 MED ORDER — NYSTATIN 100000 UNIT/GM EX CREA: 1.0000 "application " | TOPICAL_CREAM | Freq: Two times a day (BID) | CUTANEOUS | 1 refills | Status: DC

## 2017-11-17 MED ORDER — NYSTATIN 100000 UNIT/GM EX POWD: Freq: Four times a day (QID) | CUTANEOUS | 0 refills | Status: DC

## 2017-11-27 ENCOUNTER — Other Ambulatory Visit: Payer: Self-pay | Admitting: Internal Medicine

## 2017-12-17 ENCOUNTER — Ambulatory Visit: Payer: Self-pay | Admitting: Internal Medicine

## 2018-03-04 ENCOUNTER — Other Ambulatory Visit: Payer: Self-pay | Admitting: Internal Medicine

## 2018-03-15 NOTE — Progress Notes (Deleted)
FOLLOW UP  Assessment:   Essential hypertension - continue medications, DASH diet, exercise and monitor at home. Call if greater than 130/80.  -     CBC with Differential/Platelet -     BASIC METABOLIC PANEL WITH GFR -     Hepatic function panel -     TSH -     Urinalysis, Routine w reflex microscopic (not at [facility]) -     Microalbumin / creatinine urine ratio -     EKG 12-Lead  Malignant neoplasm of upper lobe of left lung ([place]) -     Continue follow up  Subclinical hyperthyroidism -     TSH - will continue to monitor, no symptoms, has had normal RAI  Medication management -     Magnesium  Hyperlipidemia -     Lipid panel -continue medications, check lipids, decrease fatty foods, increase activity.   Other polyneuropathy ([place]) No meds at this time  Over 40 minutes of exam, counseling, chart review and critical decision making was performed Future Appointments  Date Time Provider [place]  03/17/2018  2:30 PM Vicie Mutters, PA-C [facility] None  06/17/2018  2:30 PM Liane Comber, NP [facility] None  09/16/2018  2:00 PM Vicie Mutters, PA-C [facility] None     Subjective:  Tiffany [name] is a 72 y.o. female who presents for 3 month follow up.    Her blood pressure has been controlled at home, today their BP is   She does not workout. She denies chest pain, shortness of breath, dizziness.  Patient had VATS May 2016 by Dr. Roxan Hockey for LUL primary adenocarcinoma, no chemo/radiation.  She had hip replacement last year, walks with cane for balance occ a walker, was on hydrocodone but had procedures that helped and now no longer on pain medications. No falls.  She is on cholesterol medication and denies myalgias. Her cholesterol is at goal. The cholesterol last visit was:   Lab Results  Component Value Date   CHOL 206 (H) 09/14/2017   HDL 90 09/14/2017   LDLCALC 89 09/14/2017   TRIG 174 (H) 09/14/2017   CHOLHDL 2.3 09/14/2017   Last A1C Lab Results   Component Value Date   HGBA1C 4.8 04/01/2017   Last GFR: Lab Results  Component Value Date   GFRNONAA 55 (L) 09/14/2017   Patient is on Vitamin D supplement.   Lab Results  Component Value Date   VD25OH 94 04/01/2017     BMI is There is no height or weight on file to calculate BMI., she is working on diet and exercise. Wt Readings from Last 3 Encounters:  09/14/17 208 lb 3.2 oz (94.4 kg)  04/01/17 212 lb (96.2 kg)  12/03/16 212 lb (96.2 kg)   Medication Review: Current Outpatient Medications on File Prior to Visit  Medication Sig Dispense Refill  . atenolol (TENORMIN) 100 MG tablet TAKE 1 TABLET BY MOUTH DAILY FOR BLOOD PRESSURE 90 tablet 1  . Cholecalciferol (VITAMIN D PO) Take 5,000 Units by mouth 2 (two) times daily.     . diphenhydrAMINE (BENADRYL) 25 MG tablet Take 25 mg by mouth every 6 (six) hours as needed for allergies.     Marland Kitchen enalapril (VASOTEC) 20 MG tablet TAKE ONE TABLET BY MOUTH DAILY 90 tablet 1  . furosemide (LASIX) 40 MG tablet TAKE ONE TABLET BY MOUTH EVERY MORNING *NEED OFFICE VISIT WITHIN NEXT 3 MONTHS* 90 tablet 0  . Magnesium 250 MG TABS Take by mouth.    . meloxicam (MOBIC)  15 MG tablet TAKE ONE TABLET BY MOUTH DAILY WITH FOOD FOR PAIN AND INFLAMMATION 90 tablet 0  . Multiple Vitamins-Minerals (MULTIVITAMIN PO) Take 1 tablet by mouth at bedtime.     Marland Kitchen nystatin (NYSTATIN) powder Apply topically 4 (four) times daily. 15 g 0  . nystatin cream (MYCOSTATIN) Apply 1 application topically 2 (two) times daily. 30 g 1  . pravastatin (PRAVACHOL) 40 MG tablet TAKE ONE TABLET BY MOUTH EVERY NIGHT AT BEDTIME 90 tablet 0  . ranitidine (ZANTAC) 300 MG tablet TAKE ONE TABLET BY MOUTH DAILY FOR ACID INDIGESTION AND REFLUX 90 tablet 0   No current facility-administered medications on file prior to visit.     Allergies  Allergen Reactions  . Other Other (See Comments)    Stool softeners caused stomach bloating, nausea, vomiting bleeding ulcer and blood transfusion     Current Problems (verified) Patient Active Problem List   Diagnosis Date Noted  . Encounter for Medicare annual wellness exam 08/27/2016  . Peripheral neuropathy ([place]) 08/27/2016  . GERD (gastroesophageal reflux disease) 03/27/2016  . Subclinical hyperthyroidism 08/18/2015  . 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 Gastric ulcer due to nonsteroidal antiinflammatory drug (NSAID) therapy   . Lung cancer ([place]) 05/09/2015  . Peripheral edema 05/01/2015  . Vitamin D deficiency 07/18/2014  . Medication management 07/18/2014  . Hyperlipidemia 04/04/2014  . Charcot's Right foot 06/15/2013  . Hypertension     Surgical History: reviewed and unchanged Family History: reviewed and unchanged Social History: reviewed and unchanged  Review of Systems  Constitutional: Negative for fever.  HENT: Negative for congestion, ear pain and sore throat.   Eyes: Negative.   Respiratory: Negative for cough, shortness of breath and wheezing.   Cardiovascular: Positive for leg swelling. Negative for chest pain and palpitations.  Gastrointestinal: Negative for blood in stool, constipation, diarrhea, heartburn and melena.  Genitourinary: Negative.   Musculoskeletal: Positive for back pain and joint pain. Negative for falls.  Skin: Negative.   Neurological: Positive for sensory change (bilateral feet). Negative for dizziness, loss of consciousness and headaches.  Psychiatric/Behavioral: Negative for depression. The patient is not nervous/anxious and does not have insomnia.      Objective:     There were no vitals filed for this visit. There is no height or weight on file to calculate BMI.  General appearance: alert, no distress, WD/WN, female HEENT: normocephalic, sclerae anicteric, TMs pearly, nares patent, no discharge or erythema, pharynx normal Oral cavity: MMM, no lesions Neck: supple, no lymphadenopathy, no thyromegaly, no masses Heart: RRR, normal S1, S2, no murmurs Lungs:  CTA bilaterally, no wheezes, rhonchi, or rales Abdomen: +bs, soft, non tender, non distended, no masses, no hepatomegaly, no splenomegaly Musculoskeletal: nontender, no swelling, no obvious deformity Extremities: no edema, no cyanosis, no clubbing Pulses: 2+ symmetric, upper and lower extremities, normal cap refill Neurological: alert, oriented x 3, CN2-12 intact, strength normal upper extremities and lower extremities, sensation normal throughout, DTRs 2+ throughout, no cerebellar signs, gait imbalanced, walks with cain Psychiatric: normal affect, behavior normal, pleasant     Vicie Mutters, PA-C   03/15/2018

## 2018-03-17 ENCOUNTER — Ambulatory Visit: Payer: Self-pay | Admitting: Physician Assistant

## 2018-03-25 NOTE — Progress Notes (Signed)
FOLLOW UP  Assessment:   Essential hypertension - continue medications, DASH diet, exercise and monitor at home. Call if greater than 130/80.  -     CBC with Differential/Platelet -     BASIC METABOLIC PANEL WITH GFR -     Hepatic function panel -     TSH  Malignant neoplasm of upper lobe of left lung ([place]) -     Continue follow up  Subclinical hyperthyroidism -     TSH - will continue to monitor, no symptoms, has had normal RAI  Medication management -     Magnesium  Hyperlipidemia -     Lipid panel -continue medications, check lipids, decrease fatty foods, increase activity.   Other polyneuropathy ([place]) No meds at this time  Over 30 minutes of exam, counseling, chart review and critical decision making was performed Future Appointments  Date Time Provider [place]  03/29/2018  4:00 PM Vicie Mutters, PA-C [facility] None  06/17/2018  2:30 PM Liane Comber, NP [facility] None  09/16/2018  2:00 PM Vicie Mutters, PA-C [facility] None     Subjective:  Tiffany [name] is a 72 y.o. female who presents for 3 month follow up.    Her blood pressure has been controlled at home, today their BP is BP: 136/90 She does not workout. She denies chest pain, shortness of breath, dizziness.  Patient had VATS May 2016 by Dr. Roxan Hockey for LUL primary adenocarcinoma, no chemo/radiation.   She had hip replacement last year, walks with cane for balance occ a walker.  She is on cholesterol medication and denies myalgias. Her cholesterol is at goal. The cholesterol last visit was:   Lab Results  Component Value Date   CHOL 206 (H) 09/14/2017   HDL 90 09/14/2017   LDLCALC 89 09/14/2017   TRIG 174 (H) 09/14/2017   CHOLHDL 2.3 09/14/2017   Last A1C Lab Results  Component Value Date   HGBA1C 4.8 04/01/2017   Last GFR: Lab Results  Component Value Date   GFRNONAA 55 (L) 09/14/2017   Patient is on Vitamin D supplement.   Lab Results  Component Value Date   VD25OH 94  04/01/2017     BMI is Body mass index is 35.83 kg/m., she is working on diet and exercise. She has had a lot of dental work done and just on soft diet that she contributes to her weight gain.  Wt Readings from Last 3 Encounters:  03/29/18 222 lb (100.7 kg)  09/14/17 208 lb 3.2 oz (94.4 kg)  04/01/17 212 lb (96.2 kg)   Medication Review: Current Outpatient Medications on File Prior to Visit  Medication Sig Dispense Refill  . atenolol (TENORMIN) 100 MG tablet TAKE 1 TABLET BY MOUTH DAILY FOR BLOOD PRESSURE 90 tablet 1  . Cholecalciferol (VITAMIN D PO) Take 5,000 Units by mouth 2 (two) times daily.     . diphenhydrAMINE (BENADRYL) 25 MG tablet Take 25 mg by mouth every 6 (six) hours as needed for allergies.     Marland Kitchen enalapril (VASOTEC) 20 MG tablet TAKE ONE TABLET BY MOUTH DAILY 90 tablet 1  . furosemide (LASIX) 40 MG tablet TAKE ONE TABLET BY MOUTH EVERY MORNING *NEED OFFICE VISIT WITHIN NEXT 3 MONTHS* 90 tablet 0  . Magnesium 250 MG TABS Take by mouth.    . meloxicam (MOBIC) 15 MG tablet TAKE ONE TABLET BY MOUTH DAILY WITH FOOD FOR PAIN AND INFLAMMATION 90 tablet 0  . Multiple Vitamins-Minerals (MULTIVITAMIN PO) Take 1 tablet by mouth  at bedtime.     Marland Kitchen nystatin (NYSTATIN) powder Apply topically 4 (four) times daily. 15 g 0  . nystatin cream (MYCOSTATIN) Apply 1 application topically 2 (two) times daily. 30 g 1  . pravastatin (PRAVACHOL) 40 MG tablet TAKE ONE TABLET BY MOUTH EVERY NIGHT AT BEDTIME 90 tablet 0  . ranitidine (ZANTAC) 300 MG tablet TAKE ONE TABLET BY MOUTH DAILY FOR ACID INDIGESTION AND REFLUX 90 tablet 0   No current facility-administered medications on file prior to visit.     Allergies  Allergen Reactions  . Other Other (See Comments)    Stool softeners caused stomach bloating, nausea, vomiting bleeding ulcer and blood transfusion    Current Problems (verified) Patient Active Problem List   Diagnosis Date Noted  . Encounter for Medicare annual wellness exam 08/27/2016   . Peripheral neuropathy ([place]) 08/27/2016  . GERD (gastroesophageal reflux disease) 03/27/2016  . Subclinical hyperthyroidism 08/18/2015  . 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 Gastric ulcer due to nonsteroidal antiinflammatory drug (NSAID) therapy   . Lung cancer ([place]) 05/09/2015  . Peripheral edema 05/01/2015  . Vitamin D deficiency 07/18/2014  . Medication management 07/18/2014  . Hyperlipidemia 04/04/2014  . Charcot's Right foot 06/15/2013  . Hypertension     Surgical History: reviewed and unchanged Family History: reviewed and unchanged Social History: reviewed and unchanged  Review of Systems  Constitutional: Negative for fever.  HENT: Negative for congestion, ear pain and sore throat.   Eyes: Negative.   Respiratory: Negative for cough, shortness of breath and wheezing.   Cardiovascular: Positive for leg swelling. Negative for chest pain and palpitations.  Gastrointestinal: Negative for blood in stool, constipation, diarrhea, heartburn and melena.  Genitourinary: Negative.   Musculoskeletal: Positive for back pain and joint pain. Negative for falls.  Skin: Negative.   Neurological: Positive for sensory change (bilateral feet). Negative for dizziness, loss of consciousness and headaches.  Psychiatric/Behavioral: Negative for depression. The patient is not nervous/anxious and does not have insomnia.      Objective:     Today's Vitals   03/29/18 1541  BP: 136/90  Pulse: 75  Resp: 16  Temp: 97.8 F (36.6 C)  SpO2: 96%  Weight: 222 lb (100.7 kg)  Height: 5\' 6"  (1.676 m)   Body mass index is 35.83 kg/m.  General appearance: alert, no distress, WD/WN, female HEENT: normocephalic, sclerae anicteric, TMs pearly, nares patent, no discharge or erythema, pharynx normal Oral cavity: MMM, no lesions Neck: supple, no lymphadenopathy, no thyromegaly, no masses Heart: RRR, normal S1, S2, no murmurs Lungs: CTA bilaterally, no wheezes, rhonchi, or  rales Abdomen: +bs, soft, non tender, non distended, no masses, no hepatomegaly, no splenomegaly Musculoskeletal: nontender, no swelling, no obvious deformity Extremities: no edema, no cyanosis, no clubbing Pulses: 2+ symmetric, upper and lower extremities, normal cap refill Neurological: alert, oriented x 3, CN2-12 intact, strength normal upper extremities and lower extremities, sensation normal throughout, DTRs 2+ throughout, no cerebellar signs, gait imbalanced, , has tremor with intention, no cog wheeling, walks with cain Psychiatric: normal affect, behavior normal, pleasant     Vicie Mutters, PA-C   03/29/2018

## 2018-03-29 ENCOUNTER — Ambulatory Visit (INDEPENDENT_AMBULATORY_CARE_PROVIDER_SITE_OTHER): Payer: Medicare Other | Admitting: Physician Assistant

## 2018-03-29 ENCOUNTER — Encounter: Payer: Self-pay | Admitting: Physician Assistant

## 2018-03-29 VITALS — BP 136/90 | HR 75 | Temp 97.8°F | Resp 16 | Ht 66.0 in | Wt 222.0 lb

## 2018-03-29 DIAGNOSIS — E782 Mixed hyperlipidemia: Secondary | ICD-10-CM

## 2018-03-29 DIAGNOSIS — Z79899 Other long term (current) drug therapy: Secondary | ICD-10-CM | POA: Diagnosis not present

## 2018-03-29 DIAGNOSIS — G6289 Other specified polyneuropathies: Secondary | ICD-10-CM

## 2018-03-29 DIAGNOSIS — I10 Essential (primary) hypertension: Secondary | ICD-10-CM

## 2018-03-29 DIAGNOSIS — E0590 Thyrotoxicosis, unspecified without thyrotoxic crisis or storm: Secondary | ICD-10-CM

## 2018-03-29 DIAGNOSIS — C3412 Malignant neoplasm of upper lobe, left bronchus or lung: Secondary | ICD-10-CM

## 2018-03-29 NOTE — Patient Instructions (Signed)
Being dehydrated can hurt your kidneys, cause fatigue, headaches, muscle aches, joint pain, and dry skin/nails so please increase your fluids.   Drink 80-100 oz a day of water, measure it out! Eat 3 meals a day, have to do breakfast, eat protein- hard boiled eggs, protein bar like [place] protein bar, greek yogurt like oikos triple zero, chobani 100, or light n fit greek  Can check out plantnanny app on your phone to help you keep track of your water      When it comes to diets, agreement about the perfect plan isn't easy to find, even among the experts. Experts at the [place] [employer] developed an idea known as the Healthy Eating Plate. Just imagine a plate divided into logical, healthy portions.  The emphasis is on diet quality:  Load up on vegetables and fruits - one-half of your plate: Aim for color and variety, and remember that potatoes don't count.  Go for whole grains - one-quarter of your plate: Whole wheat, barley, wheat berries, quinoa, oats, brown rice, and foods made with them. If you want pasta, go with whole wheat pasta.  Protein power - one-quarter of your plate: Fish, chicken, beans, and nuts are all healthy, versatile protein sources. Limit red meat.  The diet, however, does go beyond the plate, offering a few other suggestions.  Use healthy plant oils, such as olive, canola, soy, corn, sunflower and peanut. Check the labels, and avoid partially hydrogenated oil, which have unhealthy trans fats.  If you're thirsty, drink water. Coffee and tea are good in moderation, but skip sugary drinks and limit milk and dairy products to one or two daily servings.  The type of carbohydrate in the diet is more important than the amount. Some sources of carbohydrates, such as vegetables, fruits, whole grains, and beans-are healthier than others.  Finally, stay active.  Veggies are great because you can eat a ton! They are low in calories, great to fill you up,  and have a ton of vitamins, minerals, and protein.

## 2018-03-30 LAB — BASIC METABOLIC PANEL WITH GFR
BUN/Creatinine Ratio: 16 (calc) (ref 6–22)
BUN: 19 mg/dL (ref 7–25)
CO2: 28 mmol/L (ref 20–32)
CREATININE: 1.21 mg/dL — AB (ref 0.50–0.99)
Calcium: 9.3 mg/dL (ref 8.6–10.4)
Chloride: 101 mmol/L (ref 98–110)
GFR, Est African American: 53 mL/min/{1.73_m2} — ABNORMAL LOW (ref >=60)
GFR, Est Non African American: 46 mL/min/{1.73_m2} — ABNORMAL LOW (ref >=60)
Glucose, Bld: 105 mg/dL — ABNORMAL HIGH (ref 65–99)
Potassium: 4.6 mmol/L (ref 3.5–5.3)
SODIUM: 139 mmol/L (ref 135–146)

## 2018-03-30 LAB — MAGNESIUM: Magnesium: 2 mg/dL (ref 1.5–2.5)

## 2018-03-30 LAB — CBC WITH DIFFERENTIAL/PLATELET
Basophils Absolute: 81 cells/uL (ref 0–200)
Basophils Relative: 0.8 %
EOS PCT: 0.1 %
Eosinophils Absolute: 10 cells/uL — ABNORMAL LOW (ref 15–500)
HEMATOCRIT: 40.2 % (ref 35.0–45.0)
Hemoglobin: 13.8 g/dL (ref 11.7–15.5)
LYMPHS ABS: 2091 {cells}/uL (ref 850–3900)
MCH: 31.9 pg (ref 27.0–33.0)
MCHC: 34.3 g/dL (ref 32.0–36.0)
MCV: 93.1 fL (ref 80.0–100.0)
MONOS PCT: 7.3 %
MPV: 9.8 fL (ref 7.5–12.5)
NEUTROS ABS: 7181 {cells}/uL (ref 1500–7800)
NEUTROS PCT: 71.1 %
Platelets: 351 10*3/uL (ref 140–400)
RBC: 4.32 10*6/uL (ref 3.80–5.10)
RDW: 11.9 % (ref 11.0–15.0)
Total Lymphocyte: 20.7 %
WBC mixed population: 737 cells/uL (ref 200–950)
WBC: 10.1 10*3/uL (ref 3.8–10.8)

## 2018-03-30 LAB — TSH: TSH: 0.06 mIU/L — ABNORMAL LOW (ref 0.40–4.50)

## 2018-03-30 LAB — LIPID PANEL
CHOLESTEROL: 200 mg/dL — AB (ref <200)
HDL: 80 mg/dL (ref >50)
LDL Cholesterol (Calc): 97 mg/dL (calc)
Non-HDL Cholesterol (Calc): 120 mg/dL (calc) (ref <130)
Total CHOL/HDL Ratio: 2.5 (calc) (ref <5.0)
Triglycerides: 130 mg/dL (ref <150)

## 2018-03-30 LAB — HEPATIC FUNCTION PANEL
AG RATIO: 1.3 (calc) (ref 1.0–2.5)
ALKALINE PHOSPHATASE (APISO): 125 U/L (ref 33–130)
ALT: 24 U/L (ref 6–29)
AST: 23 U/L (ref 10–35)
Albumin: 3.9 g/dL (ref 3.6–5.1)
BILIRUBIN DIRECT: 0.1 mg/dL (ref 0.0–0.2)
BILIRUBIN TOTAL: 0.3 mg/dL (ref 0.2–1.2)
Globulin: 3 g/dL (calc) (ref 1.9–3.7)
Indirect Bilirubin: 0.2 mg/dL (calc) (ref 0.2–1.2)
Total Protein: 6.9 g/dL (ref 6.1–8.1)

## 2018-04-01 ENCOUNTER — Other Ambulatory Visit: Payer: Self-pay | Admitting: Adult Health

## 2018-04-01 MED ORDER — FLUTICASONE PROPIONATE 50 MCG/ACT NA SUSP: NASAL | 2 refills | Status: DC

## 2018-04-01 NOTE — Progress Notes (Signed)
Patient called to request prescription for nasal spray for nasal congestion and allergy symptoms. Flonase sent in to pharmacy on file as requested.

## 2018-04-21 ENCOUNTER — Other Ambulatory Visit: Payer: Self-pay | Admitting: Internal Medicine

## 2018-05-21 ENCOUNTER — Other Ambulatory Visit: Payer: Self-pay | Admitting: Internal Medicine

## 2018-05-24 ENCOUNTER — Other Ambulatory Visit: Payer: Self-pay | Admitting: Internal Medicine

## 2018-05-31 ENCOUNTER — Other Ambulatory Visit: Payer: Self-pay | Admitting: Adult Health

## 2018-06-03 ENCOUNTER — Other Ambulatory Visit: Payer: Self-pay | Admitting: Adult Health

## 2018-06-17 ENCOUNTER — Ambulatory Visit: Payer: Self-pay | Admitting: Adult Health

## 2018-06-27 DIAGNOSIS — E669 Obesity, unspecified: Secondary | ICD-10-CM | POA: Insufficient documentation

## 2018-06-27 NOTE — Progress Notes (Signed)
FOLLOW UP  Assessment and Plan:   Hypertension/ peripheral edema Well controlled  Monitor blood pressure at home; patient to call if consistently greater than 130/80 Continue DASH diet.   Reminder to go to the ER if any CP, SOB, nausea, dizziness, severe HA, changes vision/speech, left arm numbness and tingling and jaw pain.  Cholesterol Currently at goal; continue statin Continue low cholesterol diet and exercise.  Check lipid panel.   Other abnormal glucose Recent A1Cs at goal Discussed diet/exercise, weight management  Defer A1C; check BMP  Obesity with co morbidities Long discussion about weight loss, diet, and exercise Recommended diet heavy in fruits and veggies and low in animal meats, cheeses, and dairy products, appropriate calorie intake Discussed ideal weight for height - weight goal for next visit 220 lb Patient will work on watching portion sizes, increase water intake, chair exercises 10 min daily Will follow up in 3 months  Vitamin D Def At goal at last visit; continue supplementation to maintain goal of 70-100 Defer Vit D level  Subclinical hyperthyroidism -     TSH - will continue to monitor, no symptoms, has had normal RAI   Continue diet and meds as discussed. Further disposition pending results of labs. Discussed med's effects and SE's.   Over 30 minutes of exam, counseling, chart review, and critical decision making was performed.   Future Appointments  Date Time Provider [place]  09/16/2018  2:00 PM Vicie Mutters, PA-C [facility] None    ----------------------------------------------------------------------------------------------------------------------  HPI 72 y.o. female  Presents accompanied by her husband for 3 month follow up on hypertension, cholesterol, glucose management, weight and vitamin D deficiency.   BMI is Body mass index is 36.8 kg/m., she has not been working on diet, limited exercise due to balance. Wt Readings  from Last 3 Encounters:  06/28/18 228 lb (103.4 kg)  03/29/18 222 lb (100.7 kg)  09/14/17 208 lb 3.2 oz (94.4 kg)   Her blood pressure has been controlled at home, today their BP is BP: 126/70  She does not workout. She denies chest pain, shortness of breath, dizziness.   She is on cholesterol medication and denies myalgias. Her cholesterol is at goal. The cholesterol last visit was:   Lab Results  Component Value Date   CHOL 200 (H) 03/29/2018   HDL 80 03/29/2018   LDLCALC 97 03/29/2018   TRIG 130 03/29/2018   CHOLHDL 2.5 03/29/2018    She has been working on diet and exercise for glucose management, and denies foot ulcerations, increased appetite, nausea, paresthesia of the feet, polydipsia, polyuria, visual disturbances and vomiting. Last A1C in the office was:  Lab Results  Component Value Date   HGBA1C 4.8 04/01/2017   Patient is on Vitamin D supplement.   Lab Results  Component Value Date   VD25OH 94 04/01/2017        Current Medications:  Current Outpatient Medications on File Prior to Visit  Medication Sig  . atenolol (TENORMIN) 100 MG tablet TAKE 1 TABLET BY MOUTH DAILY FOR BLOOD PRESSURE  . Cholecalciferol (VITAMIN D PO) Take 5,000 Units by mouth 2 (two) times daily.   . diphenhydrAMINE (BENADRYL) 25 MG tablet Take 25 mg by mouth every 6 (six) hours as needed for allergies.   Marland Kitchen enalapril (VASOTEC) 20 MG tablet TAKE ONE TABLET BY MOUTH DAILY  . fluticasone (FLONASE) 50 MCG/ACT nasal spray 1-2 sprays per nostril daily as needed for allergy symptoms and congestion.  . furosemide (LASIX) 40 MG tablet TAKE ONE  TABLET BY MOUTH EVERY MORNING   . Magnesium 250 MG TABS Take by mouth.  . meloxicam (MOBIC) 15 MG tablet TAKE ONE TABLET BY MOUTH DAILY WITH FOOD FOR PAIN AND INFLAMMATION  . Multiple Vitamins-Minerals (MULTIVITAMIN PO) Take 1 tablet by mouth at bedtime.   Marland Kitchen nystatin (NYSTATIN) powder Apply topically 4 (four) times daily.  Marland Kitchen nystatin cream (MYCOSTATIN) Apply 1  application topically 2 (two) times daily.  . pravastatin (PRAVACHOL) 40 MG tablet TAKE ONE TABLET BY MOUTH EVERY NIGHT AT BEDTIME  . ranitidine (ZANTAC) 300 MG tablet TAKE ONE TABLET BY MOUTH DAILY FOR ACID INDIGESTION AND REFLUX   No current facility-administered medications on file prior to visit.      Allergies:  Allergies  Allergen Reactions  . Other Other (See Comments)    Stool softeners caused stomach bloating, nausea, vomiting bleeding ulcer and blood transfusion     Medical History:  Past Medical History:  Diagnosis Date  . Anemia   . Arthritis   . Gait difficulty    problems with balance  . High blood pressure   . History of blood transfusion    05-14-15  . Hyperlipidemia   . Hyperthyroidism mild  . Neuropathy   . Obesity (BMI 35.0-39.9 without comorbidity)   . Pre-diabetes   . Vitamin D deficiency    Family history- Reviewed and unchanged Social history- Reviewed and unchanged   Review of Systems:  Review of Systems  Constitutional: Negative for malaise/fatigue and weight loss.  HENT: Negative for hearing loss and tinnitus.   Eyes: Negative for blurred vision and double vision.  Respiratory: Negative for cough, shortness of breath and wheezing.   Cardiovascular: Negative for chest pain, palpitations, orthopnea, claudication and leg swelling.  Gastrointestinal: Negative for abdominal pain, blood in stool, constipation, diarrhea, heartburn, melena, nausea and vomiting.  Genitourinary: Negative.   Musculoskeletal: Negative for joint pain and myalgias.  Skin: Negative for rash.  Neurological: Negative for dizziness, tingling, sensory change, weakness and headaches.  Endo/Heme/Allergies: Negative for polydipsia.  Psychiatric/Behavioral: Negative.   All other systems reviewed and are negative.   Physical Exam: BP 126/70   Pulse 86   Temp 97.6 F (36.4 C)   Resp 16   Ht 5\' 6"  (1.676 m)   Wt 228 lb (103.4 kg)   SpO2 99%   BMI 36.80 kg/m  Wt Readings  from Last 3 Encounters:  06/28/18 228 lb (103.4 kg)  03/29/18 222 lb (100.7 kg)  09/14/17 208 lb 3.2 oz (94.4 kg)   General Appearance: Well nourished, in no apparent distress. Eyes: PERRLA, EOMs, conjunctiva no swelling or erythema Sinuses: No Frontal/maxillary tenderness ENT/Mouth: Ext aud canals clear, TMs without erythema, bulging. No erythema, swelling, or exudate on post pharynx.  Tonsils not swollen or erythematous. Hearing normal.  Neck: Supple, thyroid normal.  Respiratory: Respiratory effort normal, BS equal bilaterally without rales, rhonchi, wheezing or stridor.  Cardio: RRR with no MRGs. Brisk peripheral pulses without edema.  Abdomen: Soft, + BS.  Non tender, no guarding, rebound, hernias, masses. Lymphatics: Non tender without lymphadenopathy.  Musculoskeletal: Full ROM, symmetrical strength, slow unstead gait with cane Skin: Warm, dry without rashes, lesions, ecchymosis.  Neuro: Cranial nerves intact. No cerebellar symptoms. Resting tremor of bilateral hands Psych: Awake and oriented X 3, normal affect, Insight and Judgment appropriate.    Izora Ribas, NP 3:37 PM [facility]

## 2018-06-28 ENCOUNTER — Encounter: Payer: Self-pay | Admitting: Adult Health

## 2018-06-28 ENCOUNTER — Ambulatory Visit (INDEPENDENT_AMBULATORY_CARE_PROVIDER_SITE_OTHER): Payer: Medicare Other | Admitting: Adult Health

## 2018-06-28 VITALS — BP 126/70 | HR 86 | Temp 97.6°F | Resp 16 | Ht 66.0 in | Wt 228.0 lb

## 2018-06-28 DIAGNOSIS — I10 Essential (primary) hypertension: Secondary | ICD-10-CM

## 2018-06-28 DIAGNOSIS — E782 Mixed hyperlipidemia: Secondary | ICD-10-CM

## 2018-06-28 DIAGNOSIS — E0590 Thyrotoxicosis, unspecified without thyrotoxic crisis or storm: Secondary | ICD-10-CM | POA: Diagnosis not present

## 2018-06-28 DIAGNOSIS — E669 Obesity, unspecified: Secondary | ICD-10-CM

## 2018-06-28 DIAGNOSIS — E559 Vitamin D deficiency, unspecified: Secondary | ICD-10-CM

## 2018-06-28 DIAGNOSIS — R7309 Other abnormal glucose: Secondary | ICD-10-CM

## 2018-06-28 DIAGNOSIS — R609 Edema, unspecified: Secondary | ICD-10-CM | POA: Diagnosis not present

## 2018-06-28 DIAGNOSIS — Z79899 Other long term (current) drug therapy: Secondary | ICD-10-CM

## 2018-06-28 NOTE — Patient Instructions (Signed)
Weight goal for next visit: 220 lb   Aim for 7+ servings of fruits and vegetables daily  80+ fluid ounces of water or unsweet tea for healthy kidneys  Limit animal fats in diet for cholesterol and heart health - choose grass fed whenever available  Aim for low stress - take time to unwind and care for your mental health  Aim for 150 min of moderate intensity exercise weekly for heart health, and weights twice weekly for bone health  Aim for 7-9 hours of sleep daily    Drink 1/2 your body weight in fluid ounces of water daily; drink a tall glass of water 30 min before meals  Don't eat until you're stuffed- listen to your stomach and eat until you are 80% full   Try eating off of a salad plate; wait 10 min after finishing before going back for seconds  Start by eating the vegetables on your plate; aim for 50% of your meals to be fruits or vegetables  Then eat your protein - lean meats (grass fed if possible), fish, beans, nuts in moderation  Eat your carbs/starch last ONLY if you still are hungry. If you can, stop before finishing it all  Avoid sugar and flour - the closer it looks to it's original form in nature, typically the better it is for you  Splurge in moderation - "assign" days when you get to splurge and have the "bad stuff" - I like to follow a 80% - 20% plan- "good" choices 80 % of the time, "bad" choices in moderation 20% of the time  Simple equation is: Calories out > calories in = weight loss - even if you eat the bad stuff, if you limit portions, you will still lose weight       When it comes to diets, agreement about the perfect plan isn't easy to find, even among the experts. Experts at the [place] developed an idea known as the Healthy Eating Plate. Just imagine a plate divided into logical, healthy portions.  The emphasis is on diet quality:  Load up on vegetables and fruits - one-half of your plate: Aim for color and variety, and  remember that potatoes don't count.  Go for whole grains - one-quarter of your plate: Whole wheat, barley, wheat berries, quinoa, oats, brown rice, and foods made with them. If you want pasta, go with whole wheat pasta.  Protein power - one-quarter of your plate: Fish, chicken, beans, and nuts are all healthy, versatile protein sources. Limit red meat.  The diet, however, does go beyond the plate, offering a few other suggestions.  Use healthy plant oils, such as olive, canola, soy, corn, sunflower and peanut. Check the labels, and avoid partially hydrogenated oil, which have unhealthy trans fats.  If you're thirsty, drink water. Coffee and tea are good in moderation, but skip sugary drinks and limit milk and dairy products to one or two daily servings.  The type of carbohydrate in the diet is more important than the amount. Some sources of carbohydrates, such as vegetables, fruits, whole grains, and beans-are healthier than others.  Finally, stay active.

## 2018-06-29 LAB — COMPLETE METABOLIC PANEL WITH GFR
AG RATIO: 1.4 (calc) (ref 1.0–2.5)
ALBUMIN MSPROF: 4.1 g/dL (ref 3.6–5.1)
ALT: 26 U/L (ref 6–29)
AST: 25 U/L (ref 10–35)
Alkaline phosphatase (APISO): 124 U/L (ref 33–130)
BILIRUBIN TOTAL: 0.6 mg/dL (ref 0.2–1.2)
BUN / CREAT RATIO: 16 (calc) (ref 6–22)
BUN: 16 mg/dL (ref 7–25)
CHLORIDE: 97 mmol/L — AB (ref 98–110)
CO2: 29 mmol/L (ref 20–32)
CREATININE: 1 mg/dL — AB (ref 0.50–0.99)
Calcium: 9.7 mg/dL (ref 8.6–10.4)
GFR, Est African American: 67 mL/min/{1.73_m2} (ref >=60)
GFR, Est Non African American: 57 mL/min/{1.73_m2} — ABNORMAL LOW (ref >=60)
GLOBULIN: 3 g/dL (ref 1.9–3.7)
Glucose, Bld: 100 mg/dL — ABNORMAL HIGH (ref 65–99)
Potassium: 4.3 mmol/L (ref 3.5–5.3)
SODIUM: 137 mmol/L (ref 135–146)
Total Protein: 7.1 g/dL (ref 6.1–8.1)

## 2018-06-29 LAB — LIPID PANEL
CHOL/HDL RATIO: 2.4 (calc) (ref <5.0)
CHOLESTEROL: 203 mg/dL — AB (ref <200)
HDL: 85 mg/dL (ref >50)
LDL Cholesterol (Calc): 94 mg/dL (calc)
Non-HDL Cholesterol (Calc): 118 mg/dL (calc) (ref <130)
Triglycerides: 139 mg/dL (ref <150)

## 2018-06-29 LAB — CBC WITH DIFFERENTIAL/PLATELET
BASOS ABS: 74 {cells}/uL (ref 0–200)
Basophils Relative: 0.8 %
EOS ABS: 239 {cells}/uL (ref 15–500)
Eosinophils Relative: 2.6 %
HEMATOCRIT: 40.2 % (ref 35.0–45.0)
HEMOGLOBIN: 13.3 g/dL (ref 11.7–15.5)
LYMPHS ABS: 2300 {cells}/uL (ref 850–3900)
MCH: 30.9 pg (ref 27.0–33.0)
MCHC: 33.1 g/dL (ref 32.0–36.0)
MCV: 93.3 fL (ref 80.0–100.0)
MPV: 9.6 fL (ref 7.5–12.5)
Monocytes Relative: 7.9 %
NEUTROS ABS: 5860 {cells}/uL (ref 1500–7800)
Neutrophils Relative %: 63.7 %
Platelets: 351 10*3/uL (ref 140–400)
RBC: 4.31 10*6/uL (ref 3.80–5.10)
RDW: 13 % (ref 11.0–15.0)
Total Lymphocyte: 25 %
WBC mixed population: 727 cells/uL (ref 200–950)
WBC: 9.2 10*3/uL (ref 3.8–10.8)

## 2018-06-29 LAB — TSH: TSH: 0.05 m[IU]/L — AB (ref 0.40–4.50)

## 2018-07-16 ENCOUNTER — Other Ambulatory Visit: Payer: Self-pay | Admitting: Internal Medicine

## 2018-07-21 ENCOUNTER — Encounter (INDEPENDENT_AMBULATORY_CARE_PROVIDER_SITE_OTHER): Payer: Self-pay | Admitting: Orthopaedic Surgery

## 2018-07-21 ENCOUNTER — Ambulatory Visit (INDEPENDENT_AMBULATORY_CARE_PROVIDER_SITE_OTHER): Payer: Medicare Other | Admitting: Orthopaedic Surgery

## 2018-07-21 ENCOUNTER — Ambulatory Visit (INDEPENDENT_AMBULATORY_CARE_PROVIDER_SITE_OTHER): Payer: Medicare Other

## 2018-07-21 DIAGNOSIS — M545 Low back pain, unspecified: Secondary | ICD-10-CM | POA: Insufficient documentation

## 2018-07-21 DIAGNOSIS — M25562 Pain in left knee: Secondary | ICD-10-CM

## 2018-07-21 DIAGNOSIS — M1712 Unilateral primary osteoarthritis, left knee: Secondary | ICD-10-CM | POA: Insufficient documentation

## 2018-07-21 MED ORDER — LIDOCAINE HCL 1 % IJ SOLN
3.0000 mL | INTRAMUSCULAR | Status: AC | PRN
  Administered 2018-07-21: 3 mL

## 2018-07-21 MED ORDER — METHYLPREDNISOLONE 4 MG PO TABS: ORAL_TABLET | ORAL | 0 refills | Status: DC

## 2018-07-21 MED ORDER — HYDROCODONE-ACETAMINOPHEN 5-325 MG PO TABS: 1.0000 | ORAL_TABLET | Freq: Four times a day (QID) | ORAL | 0 refills | Status: DC | PRN

## 2018-07-21 MED ORDER — METHYLPREDNISOLONE ACETATE 40 MG/ML IJ SUSP
40.0000 mg | INTRAMUSCULAR | Status: AC | PRN
  Administered 2018-07-21: 40 mg via INTRA_ARTICULAR

## 2018-07-21 MED ORDER — METHOCARBAMOL 500 MG PO TABS: 500.0000 mg | ORAL_TABLET | Freq: Four times a day (QID) | ORAL | 1 refills | Status: DC | PRN

## 2018-07-21 NOTE — Progress Notes (Signed)
Patient is only seen before.  She is a pleasant 72 year old female  Office Visit Note   Patient: Tiffany [name]           Date of Birth: 1946/04/23           MRN: [id number] Visit Date: 07/21/2018              Requested by: Unk Pinto, MD [address] [place], [place] [address] PCP: Unk Pinto, MD   Assessment & Plan: Visit Diagnoses:  1. Left knee pain, unspecified chronicity   2. Acute bilateral low back pain, with sciatica presence unspecified   3. Unilateral primary osteoarthritis, left knee     Plan: I would like to try a combination of a steroid injection in her left knee as well as a Medrol Dosepak for her spine.  We will also try Robaxin as a muscle relaxant and hydrocodone for pain.  I do feel that she is a candidate for at least hyaluronic acid in her left knee given moderate arthritis and the pain from this arthritis.  She agrees with this and we will see her back in a month for that injection.  We will hold off on therapy right now until her pain is under better control but she may be a candidate for outpatient physical therapy.  We will reevaluate her lumbar spine in 4 weeks from now.  She may need an MRI on that as well.  She is severely claustrophobic though.  Follow-Up Instructions: Return in about 1 month (around 08/18/2018).   Orders:  Orders Placed This Encounter  Procedures  . Large Joint Inj: L knee  . XR Knee 1-2 Views Left  . XR Lumbar Spine 2-3 Views   Meds ordered this encounter  Medications  . methylPREDNISolone (MEDROL) 4 MG tablet    Sig: Medrol dose pack. Take as instructed    Dispense:  21 tablet    Refill:  0  . methocarbamol (ROBAXIN) 500 MG tablet    Sig: Take 1 tablet (500 mg total) by mouth every 6 (six) hours as needed for muscle spasms.    Dispense:  60 tablet    Refill:  1  . HYDROcodone-acetaminophen (NORCO/VICODIN) 5-325 MG tablet    Sig: Take 1-2 tablets by mouth every 6 (six) hours as needed for moderate  pain.    Dispense:  40 tablet    Refill:  0      Procedures: Large Joint Inj: L knee on 07/21/2018 4:10 PM Indications: diagnostic evaluation and pain Details: 22 G 1.5 in needle, superolateral approach  Arthrogram: No  Medications: 3 mL lidocaine 1 %; 40 mg methylPREDNISolone acetate 40 MG/ML Outcome: tolerated well, no immediate complications Procedure, treatment alternatives, risks and benefits explained, specific risks discussed. Consent was given by the patient. Immediately prior to procedure a time out was called to verify the correct patient, procedure, equipment, support staff and site/side marked as required. Patient was prepped and draped in the usual sterile fashion.       Clinical Data: No additional findings.   Subjective: Chief Complaint  Patient presents with  . Left Knee - Pain  The patient is only seen before who comes in with acute on chronic right-sided low back pain as well as left knee pain.  She is had a history of a left total hip arthroplasty and a right knee replacement.  She is seen a chronic pain specialist for lumbar spine before.  She is not on chronic pain  medication but had some type of nerve procedure she describes her lumbar spine.  She had an MRI  remotely in the past that showed significant degenerative changes in the lumbar spine with a degenerative scoliosis.  The pain is become acute recently in the lower aspect of her lumbar spine to the right as well as her left knee with no known injury.  She is a moderate to morbidly obese individual.  She does have peripheral neuropathy but she is not diabetic.  She mainly sits in a chair and has limited mobility due to this pain.  HPI  Review of Systems She currently denies any headache, chest pain, shortness of breath, fever, chills, nausea, vomiting.  Objective: Vital Signs: There were no vitals taken for this visit.  Physical Exam She is alert and oriented x3 and in no acute distress Ortho  Exam Examination of her lumbar spine shows severe pain to even touching her back barely on the right side she jumps in pain.  She has negative straight leg raise bilaterally but she does have weakness in both of her legs.  Her right knee appears normal her left knee shows no effusion with good range of motion and feels stable ligamentously.  Given the severe nature of her acute pain flareup Specialty Comments:  No specialty comments available.  Imaging: Xr Knee 1-2 Views Left  Result Date: 07/21/2018 2 views of the left knee show moderate arthritic findings with joint space narrowing.  There is diffuse osteopenia about the knee.  There are no acute findings and no malalignment.  Xr Lumbar Spine 2-3 Views  Result Date: 07/21/2018 2 views of the lumbar spine shows severe degenerative scoliosis as well as degenerative disc disease at multiple levels.  There is significant posterior arthritic changes throughout the lumbar spine as well.    PMFS History: Patient Active Problem List   Diagnosis Date Noted  . Unilateral primary osteoarthritis, left knee 07/21/2018  . Acute bilateral low back pain 07/21/2018  . Obesity (BMI 30.0-34.9) 06/27/2018  . Encounter for Medicare annual wellness exam 08/27/2016  . Peripheral neuropathy ([place]) 08/27/2016  . GERD (gastroesophageal reflux disease) 03/27/2016  . Subclinical hyperthyroidism 08/18/2015  . 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 Gastric ulcer due to nonsteroidal antiinflammatory drug (NSAID) therapy   . Lung cancer ([place]) 05/09/2015  . Peripheral edema 05/01/2015  . Vitamin D deficiency 07/18/2014  . Medication management 07/18/2014  . Hyperlipidemia 04/04/2014  . Charcot's Right foot 06/15/2013  . Hypertension    Past Medical History:  Diagnosis Date  . Anemia   . Arthritis   . Gait difficulty    problems with balance  . High blood pressure   . History of blood transfusion    05-14-15  . Hyperlipidemia   .  Hyperthyroidism mild  . Neuropathy   . Obesity (BMI 35.0-39.9 without comorbidity)   . Pre-diabetes   . Vitamin D deficiency     Family History  Problem Relation Age of Onset  . Heart attack Father   . Hypertension Mother   . Breast cancer Maternal Aunt   . Colon cancer Paternal Grandmother     Past Surgical History:  Procedure Laterality Date  . CERVICAL DISC ARTHROPLASTY    . CRYO INTERCOSTAL NERVE BLOCK Left 05/09/2015   Procedure: CRYO INTERCOSTAL NERVE BLOCK;  Surgeon: [name], MD;  Location: [facility];  Service: Thoracic;  Laterality: Left;  . ESOPHAGOGASTRODUODENOSCOPY N/A 05/16/2015   Procedure: ESOPHAGOGASTRODUODENOSCOPY (EGD);  Surgeon:  Irene Shipper, MD;  Location: [facility];  Service: Endoscopy;  Laterality: N/A;  possible ablation of a bleeding lesion  . knee replaced     right knee x 2  . LOBECTOMY Left 05/09/2015   Procedure: LEFT LUNG UPPER LOBECTOMY;  Surgeon: [name], MD;  Location: [place];  Service: Thoracic;  Laterality: Left;  . LYMPH NODE DISSECTION Left 05/09/2015   Procedure: LYMPH NODE DISSECTION;  Surgeon: [name], MD;  Location: [place];  Service: Thoracic;  Laterality: Left;  . ROTATOR CUFF REPAIR Right   . SPINAL FUSION    . TONSILLECTOMY  age 48  . TOTAL HIP ARTHROPLASTY Left 07/13/2015   Procedure: LEFT TOTAL HIP ARTHROPLASTY ANTERIOR APPROACH;  Surgeon: Mcarthur Rossetti, MD;  Location: [facility];  Service: Orthopedics;  Laterality: Left;  Marland Kitchen VAGINAL HYSTERECTOMY    . VIDEO ASSISTED THORACOSCOPY (VATS)/WEDGE RESECTION Left 05/09/2015   Procedure: LEFT VIDEO ASSISTED THORACOSCOPY (VATS) WITH LEFT LUNG UPPER LOBE WEDGE RESECTION;  Surgeon: [name], MD;  Location: [place] [facility];  Service: Thoracic;  Laterality: Left;  Marland Kitchen VIDEO BRONCHOSCOPY WITH ENDOBRONCHIAL NAVIGATION N/A 04/18/2015   Procedure: VIDEO BRONCHOSCOPY WITH ENDOBRONCHIAL NAVIGATION;  Surgeon: Collene Gobble, MD;  Location: [place];  Service: Thoracic;   Laterality: N/A;  . VIDEO BRONCHOSCOPY WITH ENDOBRONCHIAL ULTRASOUND N/A 04/18/2015   Procedure: VIDEO BRONCHOSCOPY WITH ENDOBRONCHIAL ULTRASOUND;  Surgeon: Collene Gobble, MD;  Location: [facility];  Service: Thoracic;  Laterality: N/A;   Social History   Occupational History  . Occupation: retired  Tobacco Use  . Smoking status: Former Smoker    Packs/day: 1.00    Years: 30.00    Pack years: 30.00    Types: Cigarettes    Last attempt to quit: 05/08/2015    Years since quitting: 3.2  . Smokeless tobacco: Never Used  Substance and Sexual Activity  . Alcohol use: Yes    Alcohol/week: 0.6 oz    Types: 1 Glasses of wine per week    Comment: 1 glass wine  daily  . Drug use: No  . Sexual activity: Not on file

## 2018-07-22 ENCOUNTER — Telehealth (INDEPENDENT_AMBULATORY_CARE_PROVIDER_SITE_OTHER): Payer: Self-pay

## 2018-07-22 ENCOUNTER — Other Ambulatory Visit (INDEPENDENT_AMBULATORY_CARE_PROVIDER_SITE_OTHER): Payer: Self-pay

## 2018-07-22 NOTE — Telephone Encounter (Signed)
Left knee gel injection ?

## 2018-07-26 NOTE — Telephone Encounter (Signed)
Noted  

## 2018-07-30 ENCOUNTER — Telehealth (INDEPENDENT_AMBULATORY_CARE_PROVIDER_SITE_OTHER): Payer: Self-pay | Admitting: Orthopaedic Surgery

## 2018-07-30 ENCOUNTER — Telehealth (INDEPENDENT_AMBULATORY_CARE_PROVIDER_SITE_OTHER): Payer: Self-pay

## 2018-07-30 NOTE — Telephone Encounter (Signed)
Please advise 

## 2018-07-30 NOTE — Telephone Encounter (Signed)
Submitted application online for Monovisc, left knee. 

## 2018-07-30 NOTE — Telephone Encounter (Signed)
Patient called to state that she is running out of medication and her appt is not until 08/16/18.  She is requesting Hydrocodone.  Please call patient to advise [phone]

## 2018-08-02 ENCOUNTER — Other Ambulatory Visit: Payer: Self-pay | Admitting: Adult Health

## 2018-08-02 ENCOUNTER — Other Ambulatory Visit (INDEPENDENT_AMBULATORY_CARE_PROVIDER_SITE_OTHER): Payer: Self-pay | Admitting: Physician Assistant

## 2018-08-02 MED ORDER — HYDROCODONE-ACETAMINOPHEN 5-325 MG PO TABS: 1.0000 | ORAL_TABLET | Freq: Four times a day (QID) | ORAL | 0 refills | Status: DC | PRN

## 2018-08-02 NOTE — Telephone Encounter (Signed)
rx on desk

## 2018-08-02 NOTE — Telephone Encounter (Signed)
Patient aware Rx ready at front desk

## 2018-08-16 ENCOUNTER — Ambulatory Visit (INDEPENDENT_AMBULATORY_CARE_PROVIDER_SITE_OTHER): Payer: Medicare Other | Admitting: Orthopaedic Surgery

## 2018-08-16 ENCOUNTER — Encounter (INDEPENDENT_AMBULATORY_CARE_PROVIDER_SITE_OTHER): Payer: Self-pay | Admitting: Orthopaedic Surgery

## 2018-08-16 ENCOUNTER — Telehealth (INDEPENDENT_AMBULATORY_CARE_PROVIDER_SITE_OTHER): Payer: Self-pay

## 2018-08-16 DIAGNOSIS — M1712 Unilateral primary osteoarthritis, left knee: Secondary | ICD-10-CM | POA: Diagnosis not present

## 2018-08-16 MED ORDER — HYALURONAN 88 MG/4ML IX SOSY
88.0000 mg | PREFILLED_SYRINGE | INTRA_ARTICULAR | Status: AC | PRN
  Administered 2018-08-16: 88 mg via INTRA_ARTICULAR

## 2018-08-16 MED ORDER — HYDROCODONE-ACETAMINOPHEN 5-325 MG PO TABS: 1.0000 | ORAL_TABLET | Freq: Four times a day (QID) | ORAL | 0 refills | Status: DC | PRN

## 2018-08-16 NOTE — Telephone Encounter (Signed)
Patient approved for Monovisc, Left Knee Buy & Bill No Co-pay No PA required Appt. Scheduled 08/16/18

## 2018-08-16 NOTE — Progress Notes (Signed)
   Procedure Note  Patient: Tiffany [name]             Date of Birth: 16-Aug-1946           MRN: [id number]             Visit Date: 08/16/2018  Procedures: Visit Diagnoses: Unilateral primary osteoarthritis, left knee  Large Joint Inj: L knee on 08/16/2018 2:43 PM Indications: diagnostic evaluation and pain Details: 22 G 1.5 in needle, superolateral approach  Arthrogram: No  Medications: 88 mg Hyaluronan 88 MG/4ML Outcome: tolerated well, no immediate complications Procedure, treatment alternatives, risks and benefits explained, specific risks discussed. Consent was given by the patient. Immediately prior to procedure a time out was called to verify the correct patient, procedure, equipment, support staff and site/side marked as required. Patient was prepped and draped in the usual sterile fashion.     Patient is here today for scheduled hyaluronic acid injection to treat the pain from osteoarthritis in her left knee.  She understands the rationale behind injections like this.  He is doing well overall but still having pain from the osteoarthritis.  It moves well.  She tolerated injection without difficulty.  At this point she is having lumbar spine issues and quad weakness.  I would like to send her to physical therapy as an outpatient to work on decreasing the pain of her lumbar spine and hopefully strengthen the muscles in her legs to work on balance and coordination.  I will see her back in 4 weeks to see how she is done from her therapy.  I did refill her hydrocodone but have asked her to use this sparingly.

## 2018-08-17 ENCOUNTER — Other Ambulatory Visit (INDEPENDENT_AMBULATORY_CARE_PROVIDER_SITE_OTHER): Payer: Self-pay

## 2018-08-17 DIAGNOSIS — M5442 Lumbago with sciatica, left side: Secondary | ICD-10-CM

## 2018-08-22 ENCOUNTER — Other Ambulatory Visit: Payer: Self-pay | Admitting: Adult Health

## 2018-08-22 ENCOUNTER — Other Ambulatory Visit: Payer: Self-pay | Admitting: Internal Medicine

## 2018-08-22 DIAGNOSIS — R609 Edema, unspecified: Secondary | ICD-10-CM

## 2018-08-22 DIAGNOSIS — I10 Essential (primary) hypertension: Secondary | ICD-10-CM

## 2018-08-22 MED ORDER — FUROSEMIDE 40 MG PO TABS: ORAL_TABLET | ORAL | 0 refills | Status: DC

## 2018-08-23 ENCOUNTER — Other Ambulatory Visit: Payer: Self-pay

## 2018-08-23 ENCOUNTER — Ambulatory Visit: Payer: Medicare Other | Attending: Orthopaedic Surgery | Admitting: Physical Therapy

## 2018-08-23 DIAGNOSIS — M545 Low back pain, unspecified: Secondary | ICD-10-CM

## 2018-08-23 DIAGNOSIS — R2689 Other abnormalities of gait and mobility: Secondary | ICD-10-CM | POA: Insufficient documentation

## 2018-08-23 DIAGNOSIS — M6281 Muscle weakness (generalized): Secondary | ICD-10-CM | POA: Diagnosis not present

## 2018-08-23 NOTE — Therapy (Signed)
[place], [place], [address] Phone: [phone]   Fax:  [phone]  Physical Therapy Evaluation  Patient Details  Name: Tiffany [name] MRN: [id number] Date of Birth: 11-29-1946 Referring Provider: Dr. Jean Rosenthal    Encounter Date: 08/23/2018  PT End of Session - 08/23/18 1122    Visit Number  1    Number of Visits  16    PT Start Time  1102    PT Stop Time  1148    PT Time Calculation (min)  46 min    Activity Tolerance  Patient limited by fatigue    Behavior During Therapy  [facility] for tasks assessed/performed       Past Medical History:  Diagnosis Date  . Anemia   . Arthritis   . Gait difficulty    problems with balance  . High blood pressure   . History of blood transfusion    05-14-15  . Hyperlipidemia   . Hyperthyroidism mild  . Neuropathy   . Obesity (BMI 35.0-39.9 without comorbidity)   . Pre-diabetes   . Vitamin D deficiency     Past Surgical History:  Procedure Laterality Date  . CERVICAL DISC ARTHROPLASTY    . CRYO INTERCOSTAL NERVE BLOCK Left 05/09/2015   Procedure: CRYO INTERCOSTAL NERVE BLOCK;  Surgeon: [name], MD;  Location: [place];  Service: Thoracic;  Laterality: Left;  . ESOPHAGOGASTRODUODENOSCOPY N/A 05/16/2015   Procedure: ESOPHAGOGASTRODUODENOSCOPY (EGD);  Surgeon: Irene Shipper, MD;  Location: [facility];  Service: Endoscopy;  Laterality: N/A;  possible ablation of a bleeding lesion  . knee replaced     right knee x 2  . LOBECTOMY Left 05/09/2015   Procedure: LEFT LUNG UPPER LOBECTOMY;  Surgeon: [name], MD;  Location: [facility] [place];  Service: Thoracic;  Laterality: Left;  . LYMPH NODE DISSECTION Left 05/09/2015   Procedure: LYMPH NODE DISSECTION;  Surgeon: [name], MD;  Location: [place];  Service: Thoracic;  Laterality: Left;  . ROTATOR CUFF REPAIR Right   . SPINAL FUSION    . TONSILLECTOMY  age 10  . TOTAL HIP ARTHROPLASTY Left  07/13/2015   Procedure: LEFT TOTAL HIP ARTHROPLASTY ANTERIOR APPROACH;  Surgeon: Mcarthur Rossetti, MD;  [facility];  Service: Orthopedics;  Laterality: Left;  Marland Kitchen VAGINAL HYSTERECTOMY    . VIDEO ASSISTED THORACOSCOPY (VATS)/WEDGE RESECTION Left 05/09/2015   Procedure: LEFT VIDEO ASSISTED THORACOSCOPY (VATS) WITH LEFT LUNG UPPER LOBE WEDGE RESECTION;  Surgeon: [name], MD;  Location: [facility];  Service: Thoracic;  Laterality: Left;  Marland Kitchen VIDEO BRONCHOSCOPY WITH ENDOBRONCHIAL NAVIGATION N/A 04/18/2015   Procedure: VIDEO BRONCHOSCOPY WITH ENDOBRONCHIAL NAVIGATION;  Surgeon: Collene Gobble, MD;  Location: [facility];  Service: Thoracic;  Laterality: N/A;  . VIDEO BRONCHOSCOPY WITH ENDOBRONCHIAL ULTRASOUND N/A 04/18/2015   Procedure: VIDEO BRONCHOSCOPY WITH ENDOBRONCHIAL ULTRASOUND;  Surgeon: Collene Gobble, MD;  Location: [facility];  Service: Thoracic;  Laterality: N/A;    There were no vitals filed for this visit.   Subjective Assessment - 08/23/18 1107    Subjective  Pt began to have low back pain about 6 weeks ago "when she stretched".  She has been quite sedentary over the last 2 yrs. due to multiple health problems.  She had sciatica many yrs ago and received injections., it eventually recovered.  She reports balance disorder.   She has difficulty with general mobility, standing long periods due to endurance and balance.  Pertinent History  Sciatica 2011. Cervical spine surgery, Charcot-Marie Tooth, lung removal due to CA (no pulmonary rehab) , Lt THA, Rt TKA x 2, L knee pain , peripheral neuropathy    Limitations  Standing;Walking;House hold activities;Lifting;Other (comment)    How long can you sit comfortably?  2 hours     How long can you stand comfortably?  10 min     How long can you walk comfortably?  limited by endurance     Diagnostic tests  severe degenerative scoliosis as well as degenerative disc disease at multiple levels.  There is significant posterior arthritic changes t     Patient Stated Goals  Patient would like to get around better.  I know I'll get off this walker.  Used cane only up until 6 weeks ago.  Using walker only due to back     Currently in Pain?  No/denies    Pain Score  0-No pain   can be 8/10- 9/10    Pain Location  Back    Pain Orientation  Right;Left;Lower    Pain Descriptors / Indicators  Sharp    Pain Type  Acute pain    Pain Radiating Towards  no     Pain Onset  More than a month ago    Pain Frequency  Intermittent    Aggravating Factors   bending, standing, walking     Pain Relieving Factors  sitting, rest, pain pills , heating pad     Effect of Pain on Daily Activities  limits her, has to use her walker     Multiple Pain Sites  No         [facility] PT Assessment - 08/23/18 0001      Assessment   Medical Diagnosis  low back pain     Referring Provider  Dr. Jean Rosenthal     Onset Date/Surgical Date  --   6 weeks    Hand Dominance  Right    Next MD Visit  4 weeks     Prior Therapy  Yes for hip, knee       Precautions   Precautions  None;Fall      Restrictions   Weight Bearing Restrictions  No      Balance Screen   Has the patient fallen in the past 6 months  No    Has the patient had a decrease in activity level because of a fear of falling?   Yes    Is the patient reluctant to leave their home because of a fear of falling?   Yes   does not go out by herself      Transport planner  Private residence    Living Arrangements  Spouse/significant other    Type of [place] to enter;Other (comment)    Entrance Stairs-Number of Steps  1    Entrance Stairs-Rails  None    Home Layout  Two level    Alternate Level Stairs-Number of Steps  stays on the 1st floor     [place] - 2 wheels;[place] - single point      Prior Function   Level of Independence  Independent with basic ADLs    Vocation  Retired    [employer]     Leisure  used to  like to work in the yard      Cognition   Overall Cognitive Status  Within  Functional Limits for tasks assessed      Observation/Other Assessments   Focus on Therapeutic Outcomes (FOTO)   60%      Sensation   Additional Comments  feet grossly numb and tingly       Posture/Postural Control   Posture/Postural Control  Postural limitations    Postural Limitations  Rounded Shoulders;Forward head;Flexed trunk    Posture Comments  uses hand to steady in static standing       AROM   AROM Assessment Site  --   unable to assess in standing    Lumbar Flexion  seated WFL     Lumbar Extension  90%     Lumbar - Right Rotation  50%    Lumbar - Left Rotation  50%      Strength   Right Hip Flexion  4-/5    Left Hip Flexion  4-/5    Right Knee Flexion  4/5    Right Knee Extension  4+/5    Left Knee Flexion  4+/5    Left Knee Extension  4-/5    Right Ankle Dorsiflexion  5/5    Right Ankle Plantar Flexion  4/5    Left Ankle Dorsiflexion  4/5      Flexibility   Hamstrings  Rt 50, Lt. 45 deg SLR passive       Palpation   Palpation comment  does not tolerate palpation for evaluation, reports tender skin as a result of her pulmonary surgery.  Withdraws from touch to lumbar spine in sitting       Special Tests    Special Tests  Lumbar    Lumbar Tests  Straight Leg Raise      Straight Leg Raise   Findings  Negative      Transfers   Transfers  Supine to Sit    Supine to Sit  4: Min guard    Comments  cues for technique, unable to do when cued, increased back pain as a result      Ambulation/Gait   Ambulation Distance (Feet)  150 Feet    Assistive device  Rolling walker    Gait Pattern  Decreased step length - right;Decreased step length - left;Antalgic      Static Standing Balance   Static Standing - Balance Support  No upper extremity supported    Static Standing - Level of Assistance  4: Min assist        Objective measurements completed on examination: See above findings.      PT Education - 08/23/18 1122    Education Details  PT/POC, balance     Person(s) Educated  Patient    Methods  Explanation    Comprehension  Verbalized understanding       PT Short Term Goals - 08/23/18 1312      PT SHORT TERM GOAL #1   Title  Pt will be I with initial HEP     Time  4    Period  Weeks    Status  New    Target Date  09/20/18      PT SHORT TERM GOAL #2   Title  Pt will complete balance assessment and set goal    Time  4    Period  Weeks    Status  New    Target Date  09/20/18      PT SHORT TERM GOAL #3   Title  Patient will be able to reports less back pain with transitions on  and off bed/mat     Time  4    Period  Weeks    Status  New    Target Date  09/20/18      PT SHORT TERM GOAL #4   Title  Pt will tolerate standing for LE exercise for 8 min or more with min increase in back pain.     Time  4    Period  Weeks    Status  New    Target Date  09/20/18        PT Long Term Goals - 08/23/18 1317      PT LONG TERM GOAL #1   Title  Pt will be able to score <40 % limited on FOTO to demo improved functional mobility     Time  8    Period  Weeks    Status  New    Target Date  10/18/18      PT LONG TERM GOAL #2   Title  Pt will be able to stand, walk for 10 min in the community with pain minimal in low back and RPE of 12 or less.      Time  8    Period  Weeks    Status  New    Target Date  10/18/18      PT LONG TERM GOAL #3   Title  Pt will be able to increase hip strength to 4+/5 in all planes to improve gait, transitions.     Time  8    Period  Weeks    Status  New    Target Date  10/18/18      PT LONG TERM GOAL #4   Title  Pt will be able to improve balance score, TBA     Time  8    Period  Weeks    Status  New    Target Date  10/18/18             Plan - 08/23/18 1325    Clinical Impression Statement  Pt presents for mod complexity eval of recent onset of low back pain following a lengthy period of decreased mobility  due to othe health reasons.  Her pain was initially severe, is somewhat better now but continues to impact her comfort with transitional movements, standing longer periods.  Her endurance and balance are major factors as well, due to chronic conditions.  She was very unsteady without walker and even hand held assist.  Her L knee was not bothering her today, but we will emphasize LE strengthening as she tolerates.      History and Personal Factors relevant to plan of care:  knee and hip surgeries, lung surgery, neuropathy, balance deficits, obesity, HTN, charcot foot R     Clinical Presentation  Evolving    Clinical Presentation due to:  change in mobility status with greater dependency on assistive device, fall risk    Clinical Decision Making  Moderate    Rehab Potential  Good    PT Frequency  2x / week    PT Duration  8 weeks    PT Treatment/Interventions  ADLs/Self Care Home Management;Cryotherapy;Ultrasound;Moist Heat;Gait training;Therapeutic exercise;Patient/family education;Manual techniques;Balance training;Stair training;Functional mobility training;Passive range of motion;Electrical Stimulation;Neuromuscular re-education;DME Instruction;Therapeutic activities    PT Next Visit Plan  develop HEP , try Nustep/UBE and reinforce proper body mechanics     PT Home Exercise Plan  none yet, gave posture and body mechanics     Recommended Other Services  Neuro rehab     Consulted and Agree with Plan of Care  Patient       Patient will benefit from skilled therapeutic intervention in order to improve the following deficits and impairments:  Abnormal gait, Decreased balance, Decreased endurance, Decreased mobility, Difficulty walking, Hypomobility, Obesity, Improper body mechanics, Decreased range of motion, Decreased activity tolerance, Decreased strength, Increased fascial restricitons, Impaired flexibility, Impaired UE functional use, Postural dysfunction, Pain, Cardiopulmonary status limiting  activity  Visit Diagnosis: Acute low back pain without sciatica, unspecified back pain laterality  Other abnormalities of gait and mobility  Muscle weakness (generalized)     Problem List Patient Active Problem List   Diagnosis Date Noted  . Unilateral primary osteoarthritis, left knee 07/21/2018  . Acute bilateral low back pain 07/21/2018  . Obesity (BMI 30.0-34.9) 06/27/2018  . Encounter for Medicare annual wellness exam 08/27/2016  . Peripheral neuropathy ([place]) 08/27/2016  . GERD (gastroesophageal reflux disease) 03/27/2016  . Subclinical hyperthyroidism 08/18/2015  . 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 Gastric ulcer due to nonsteroidal antiinflammatory drug (NSAID) therapy   . Lung cancer ([place]) 05/09/2015  . Peripheral edema 05/01/2015  . Vitamin D deficiency 07/18/2014  . Medication management 07/18/2014  . Hyperlipidemia 04/04/2014  . Charcot's Right foot 06/15/2013  . Hypertension     PAA,JENNIFER 08/23/2018, 2:42 PM  [place], [place], [address] Phone: [phone]   Fax:  [phone]  Name: Tiffany [name] MRN: [id number] Date of Birth: 25-Jan-1946   Raeford Razor, PT 08/23/18 2:42 PM Phone: [phone] Fax: [phone]

## 2018-08-23 NOTE — Patient Instructions (Signed)
Posture and body mechanics from cabinet, supine to sit

## 2018-08-26 ENCOUNTER — Ambulatory Visit: Payer: Medicare Other | Admitting: Physical Therapy

## 2018-08-26 ENCOUNTER — Encounter: Payer: Self-pay | Admitting: Physical Therapy

## 2018-08-26 DIAGNOSIS — M545 Low back pain, unspecified: Secondary | ICD-10-CM

## 2018-08-26 DIAGNOSIS — M6281 Muscle weakness (generalized): Secondary | ICD-10-CM | POA: Diagnosis not present

## 2018-08-26 DIAGNOSIS — R2689 Other abnormalities of gait and mobility: Secondary | ICD-10-CM

## 2018-08-26 NOTE — Therapy (Signed)
[place], [place], [address] Phone: [phone]   Fax:  [phone]  Physical Therapy Treatment  Patient Details  Name: Tiffany [name] MRN: [id number] Date of Birth: January 26, 1946 Referring Provider: Dr. Jean Rosenthal    Encounter Date: 08/26/2018  PT End of Session - 08/26/18 1549    Visit Number  2    Number of Visits  16    PT Start Time  1505    PT Stop Time  1549    PT Time Calculation (min)  44 min    Activity Tolerance  Patient tolerated treatment well    Behavior During Therapy  [facility] for tasks assessed/performed       Past Medical History:  Diagnosis Date  . Anemia   . Arthritis   . Gait difficulty    problems with balance  . High blood pressure   . History of blood transfusion    05-14-15  . Hyperlipidemia   . Hyperthyroidism mild  . Neuropathy   . Obesity (BMI 35.0-39.9 without comorbidity)   . Pre-diabetes   . Vitamin D deficiency     Past Surgical History:  Procedure Laterality Date  . CERVICAL DISC ARTHROPLASTY    . CRYO INTERCOSTAL NERVE BLOCK Left 05/09/2015   Procedure: CRYO INTERCOSTAL NERVE BLOCK;  Surgeon: [name], MD;  Location: [place];  Service: Thoracic;  Laterality: Left;  . ESOPHAGOGASTRODUODENOSCOPY N/A 05/16/2015   Procedure: ESOPHAGOGASTRODUODENOSCOPY (EGD);  Surgeon: Irene Shipper, MD;  Location: [facility] ENDOSCOPY;  Service: Endoscopy;  Laterality: N/A;  possible ablation of a bleeding lesion  . knee replaced     right knee x 2  . LOBECTOMY Left 05/09/2015   Procedure: LEFT LUNG UPPER LOBECTOMY;  Surgeon: [name], MD;  Location: [place];  Service: Thoracic;  Laterality: Left;  . LYMPH NODE DISSECTION Left 05/09/2015   Procedure: LYMPH NODE DISSECTION;  Surgeon: [name], MD;  Location: [place];  Service: Thoracic;  Laterality: Left;  . ROTATOR CUFF REPAIR Right   . SPINAL FUSION    . TONSILLECTOMY  age 14  . TOTAL HIP ARTHROPLASTY  Left 07/13/2015   Procedure: LEFT TOTAL HIP ARTHROPLASTY ANTERIOR APPROACH;  Surgeon: Mcarthur Rossetti, MD;  Location: [facility];  Service: Orthopedics;  Laterality: Left;  Marland Kitchen VAGINAL HYSTERECTOMY    . VIDEO ASSISTED THORACOSCOPY (VATS)/WEDGE RESECTION Left 05/09/2015   Procedure: LEFT VIDEO ASSISTED THORACOSCOPY (VATS) WITH LEFT LUNG UPPER LOBE WEDGE RESECTION;  Surgeon: [name], MD;  Location: [place];  Service: Thoracic;  Laterality: Left;  Marland Kitchen VIDEO BRONCHOSCOPY WITH ENDOBRONCHIAL NAVIGATION N/A 04/18/2015   Procedure: VIDEO BRONCHOSCOPY WITH ENDOBRONCHIAL NAVIGATION;  Surgeon: Collene Gobble, MD;  Location: [place];  Service: Thoracic;  Laterality: N/A;  . VIDEO BRONCHOSCOPY WITH ENDOBRONCHIAL ULTRASOUND N/A 04/18/2015   Procedure: VIDEO BRONCHOSCOPY WITH ENDOBRONCHIAL ULTRASOUND;  Surgeon: Collene Gobble, MD;  Location: [place];  Service: Thoracic;  Laterality: N/A;    There were no vitals filed for this visit.  Subjective Assessment - 08/26/18 1510    Subjective  i twisted my back when I was here and my pain returned.  I took a pain pill and now have no pain.  I have a elliptical at home,  last used 7 weeks ago(with assist on and off)  I studied the posture handout and I think I do pretty good with the techniques.     Currently in Pain?  No/denies    Pain  Score  --   5-6/10   Pain Location  Back    Pain Orientation  Right;Lower    Pain Descriptors / Indicators  Sharp    Pain Frequency  Intermittent    Aggravating Factors   twisting ,  stretching    Pain Relieving Factors  pain pills,  heating pad                       [facility] Adult PT Treatment/Exercise - 08/26/18 0001      Lumbar Exercises: Stretches   Active Hamstring Stretch  3 reps;30 seconds   both   Pelvic Tilt  10 reps   sitting   Gastroc Stretch  3 reps;30 seconds    Gastroc Stretch Limitations  unsafe on incline, needs to do one at  time on level.      Lumbar Exercises: Aerobic   Nustep  5 minutes  UE/LE L4      Lumbar Exercises: Seated   Other Seated Lumbar Exercises  isometric abdominals both and single arm exercises 10 X each,  cues breathing      Knee/Hip Exercises: Seated   Ball Squeeze  10    Marching  1 set;10 reps    Marching Limitations  with abdominal bracing   small lifts    Hamstring Curl  1 set;10 reps    Hamstring Limitations  green band             PT Education - 08/26/18 1547    Education Details  HEP    Person(s) Educated  Patient    Methods  Explanation;Demonstration;Tactile cues;Verbal cues;Handout    Comprehension  Verbalized understanding;Returned demonstration;Need further instruction       PT Short Term Goals - 08/23/18 1312      PT SHORT TERM GOAL #1   Title  Pt will be I with initial HEP     Time  4    Period  Weeks    Status  New    Target Date  09/20/18      PT SHORT TERM GOAL #2   Title  Pt will complete balance assessment and set goal    Time  4    Period  Weeks    Status  New    Target Date  09/20/18      PT SHORT TERM GOAL #3   Title  Patient will be able to reports less back pain with transitions on and off bed/mat     Time  4    Period  Weeks    Status  New    Target Date  09/20/18      PT SHORT TERM GOAL #4   Title  Pt will tolerate standing for LE exercise for 8 min or more with min increase in back pain.     Time  4    Period  Weeks    Status  New    Target Date  09/20/18        PT Long Term Goals - 08/23/18 1317      PT LONG TERM GOAL #1   Title  Pt will be able to score <40 % limited on FOTO to demo improved functional mobility     Time  8    Period  Weeks    Status  New    Target Date  10/18/18      PT LONG TERM GOAL #2   Title  Pt will be able to stand,  walk for 10 min in the community with pain minimal in low back and RPE of 12 or less.      Time  8    Period  Weeks    Status  New    Target Date  10/18/18      PT LONG TERM GOAL #3   Title  Pt will be able to increase hip strength to 4+/5 in  all planes to improve gait, transitions.     Time  8    Period  Weeks    Status  New    Target Date  10/18/18      PT LONG TERM GOAL #4   Title  Pt will be able to improve balance score, TBA     Time  8    Period  Weeks    Status  New    Target Date  10/18/18            Plan - 08/26/18 1746    Clinical Impression Statement  Patient was able to progress her HEP without increased pain., Patient declined "Back exercises due to being afraid she would hurt her back"  so no supine exercises today.  She declined the need of modalities for pain at end of session.  No new objective findings ,  she is unable to use right UE for band posture exercises due to a torn muscle/ligament / per patient.    PT Next Visit Plan  review HEP , try Nustep/UBE and reinforce proper body mechanics     PT Home Exercise Plan  calf stretch,  sitting hamstring stretch    Consulted and Agree with Plan of Care  Patient       Patient will benefit from skilled therapeutic intervention in order to improve the following deficits and impairments:     Visit Diagnosis: Acute low back pain without sciatica, unspecified back pain laterality  Other abnormalities of gait and mobility  Muscle weakness (generalized)     Problem List Patient Active Problem List   Diagnosis Date Noted  . Unilateral primary osteoarthritis, left knee 07/21/2018  . Acute bilateral low back pain 07/21/2018  . Obesity (BMI 30.0-34.9) 06/27/2018  . Encounter for Medicare annual wellness exam 08/27/2016  . Peripheral neuropathy ([place]) 08/27/2016  . GERD (gastroesophageal reflux disease) 03/27/2016  . Subclinical hyperthyroidism 08/18/2015  . 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 Gastric ulcer due to nonsteroidal antiinflammatory drug (NSAID) therapy   . Lung cancer ([place]) 05/09/2015  . Peripheral edema 05/01/2015  . Vitamin D deficiency 07/18/2014  . Medication management 07/18/2014  . Hyperlipidemia 04/04/2014  .  Charcot's Right foot 06/15/2013  . Hypertension     Ieesha Abbasi PTA 08/26/2018, 5:56 PM  [facility] [address] [place], [place], [address] Phone: [phone]   Fax:  [phone]  Name: Tiffany [name] MRN: [id number] Date of Birth: [birth year]

## 2018-08-26 NOTE — Patient Instructions (Signed)
Leg Extension (Hamstring)    Sit toward front edge of chair, with leg out straight, heel on floor, toes pointing toward body. Keeping back straight, bend forward at hip. Repeat _2-3__ times. Repeat with other leg. Do _1__ sessions per day.  Mild stretch only.   Copyright  VHI. All rights reserved.  Calf Stretch    Place one leg forward, bent, other leg behind and straight. Lean forward keeping back heel flat. Hold __30__ seconds while counting out loud. Repeat with other leg forward. Repeat __2-3__ times. Do _1___ sessions per day.  http://gt2.exer.us/478   Copyright  VHI. All rights reserved.

## 2018-09-03 ENCOUNTER — Encounter

## 2018-09-06 ENCOUNTER — Ambulatory Visit: Payer: Medicare Other | Attending: Orthopaedic Surgery | Admitting: Physical Therapy

## 2018-09-06 ENCOUNTER — Encounter: Payer: Self-pay | Admitting: Physical Therapy

## 2018-09-06 DIAGNOSIS — M545 Low back pain, unspecified: Secondary | ICD-10-CM

## 2018-09-06 DIAGNOSIS — M6281 Muscle weakness (generalized): Secondary | ICD-10-CM | POA: Insufficient documentation

## 2018-09-06 DIAGNOSIS — R2689 Other abnormalities of gait and mobility: Secondary | ICD-10-CM | POA: Diagnosis not present

## 2018-09-06 NOTE — Therapy (Signed)
[place], [place], [address] Phone: [phone]   Fax:  [phone]  Physical Therapy Treatment  Patient Details  Name: Tiffany [name] MRN: [id number] Date of Birth: [birth year] Referring Provider: Dr. Jean Rosenthal    Encounter Date: 09/06/2018  PT End of Session - 09/06/18 1013    Visit Number  3    Number of Visits  16    Date for PT Re-Evaluation  10/18/18    PT Start Time  8937    PT Stop Time  1102    PT Time Calculation (min)  47 min    Activity Tolerance  Patient tolerated treatment well    Behavior During Therapy  [facility] for tasks assessed/performed       Past Medical History:  Diagnosis Date  . Anemia   . Arthritis   . Gait difficulty    problems with balance  . High blood pressure   . History of blood transfusion    05-14-15  . Hyperlipidemia   . Hyperthyroidism mild  . Neuropathy   . Obesity (BMI 35.0-39.9 without comorbidity)   . Pre-diabetes   . Vitamin D deficiency     Past Surgical History:  Procedure Laterality Date  . CERVICAL DISC ARTHROPLASTY    . CRYO INTERCOSTAL NERVE BLOCK Left 05/09/2015   Procedure: CRYO INTERCOSTAL NERVE BLOCK;  Surgeon: [name], MD;  Location: [place];  Service: Thoracic;  Laterality: Left;  . ESOPHAGOGASTRODUODENOSCOPY N/A 05/16/2015   Procedure: ESOPHAGOGASTRODUODENOSCOPY (EGD);  Surgeon: Irene Shipper, MD;  Location: [facility];  Service: Endoscopy;  Laterality: N/A;  possible ablation of a bleeding lesion  . knee replaced     right knee x 2  . LOBECTOMY Left 05/09/2015   Procedure: LEFT LUNG UPPER LOBECTOMY;  Surgeon: [name], MD;  Location: [place];  Service: Thoracic;  Laterality: Left;  . LYMPH NODE DISSECTION Left 05/09/2015   Procedure: LYMPH NODE DISSECTION;  Surgeon: [name], MD;  Location: [place];  Service: Thoracic;  Laterality: Left;  . ROTATOR CUFF REPAIR Right   . SPINAL FUSION    .  TONSILLECTOMY  age 72  . TOTAL HIP ARTHROPLASTY Left 07/13/2015   Procedure: LEFT TOTAL HIP ARTHROPLASTY ANTERIOR APPROACH;  Surgeon: Mcarthur Rossetti, MD;  Location: [facility];  Service: Orthopedics;  Laterality: Left;  Marland Kitchen VAGINAL HYSTERECTOMY    . VIDEO ASSISTED THORACOSCOPY (VATS)/WEDGE RESECTION Left 05/09/2015   Procedure: LEFT VIDEO ASSISTED THORACOSCOPY (VATS) WITH LEFT LUNG UPPER LOBE WEDGE RESECTION;  Surgeon: [name], MD;  Location: [place];  Service: Thoracic;  Laterality: Left;  Marland Kitchen VIDEO BRONCHOSCOPY WITH ENDOBRONCHIAL NAVIGATION N/A 04/18/2015   Procedure: VIDEO BRONCHOSCOPY WITH ENDOBRONCHIAL NAVIGATION;  Surgeon: Collene Gobble, MD;  Location: [place];  Service: Thoracic;  Laterality: N/A;  . VIDEO BRONCHOSCOPY WITH ENDOBRONCHIAL ULTRASOUND N/A 04/18/2015   Procedure: VIDEO BRONCHOSCOPY WITH ENDOBRONCHIAL ULTRASOUND;  Surgeon: Collene Gobble, MD;  Location: [place];  Service: Thoracic;  Laterality: N/A;    There were no vitals filed for this visit.  Subjective Assessment - 09/06/18 1019    Subjective  My back feels good.  I am not on pain meds. Started Meloxicam.  No pain at all.  I ache all over but that's normal for me.      Currently in Pain?  No/denies  [place] Adult PT Treatment/Exercise - 09/06/18 0001      Self-Care   Other Self-Care Comments   rolling, sit to supine, pacing       Lumbar Exercises: Stretches   Active Hamstring Stretch  3 reps;30 seconds    Single Knee to Chest Stretch  3 reps    Single Knee to Chest Stretch Limitations  each leg 30 sec     Lower Trunk Rotation  10 seconds    Lower Trunk Rotation Limitations  x 10     Piriformis Stretch  --    Gastroc Stretch  3 reps;30 seconds    Gastroc Stretch Limitations  wall       Lumbar Exercises: Seated   Other Seated Lumbar Exercises  sit to stand  19 sec , 14 sec (unable to let go of chair and get full extension )       Lumbar Exercises: Supine   Pelvic Tilt   10 reps      Knee/Hip Exercises: Standing   Hip Abduction  Stengthening;Both;1 set    Hip Extension  Stengthening;Both;1 set;10 reps      Knee/Hip Exercises: Seated   Sit to Sand  5 reps;Other (comment)   19 sec 1st trial, hands off chair, needs close S to stabiliz            PT Education - 09/06/18 1053    Education Details  HEP, body mechanics, rest breaks    Person(s) Educated  Patient    Methods  Explanation;Verbal cues;Tactile cues;Handout    Comprehension  Verbalized understanding;Need further instruction       PT Short Term Goals - 09/06/18 1036      PT SHORT TERM GOAL #1   Title  Pt will be I with initial HEP     Status  On-going      PT SHORT TERM GOAL #2   Title  Pt will complete balance assessment and set goal    Baseline  will test next visit    Status  Unable to assess      PT SHORT TERM GOAL #3   Title  Patient will be able to reports less back pain with transitions on and off bed/mat     Baseline  needs cues     Status  On-going      PT SHORT TERM GOAL #4   Title  Pt will tolerate standing for LE exercise for 8 min or more with min increase in back pain.     Status  On-going        PT Long Term Goals - 08/23/18 1317      PT LONG TERM GOAL #1   Title  Pt will be able to score <40 % limited on FOTO to demo improved functional mobility     Time  8    Period  Weeks    Status  New    Target Date  10/18/18      PT LONG TERM GOAL #2   Title  Pt will be able to stand, walk for 10 min in the community with pain minimal in low back and RPE of 12 or less.      Time  8    Period  Weeks    Status  New    Target Date  10/18/18      PT LONG TERM GOAL #3   Title  Pt will be able to increase hip strength to 4+/5 in all planes to improve  gait, transitions.     Time  8    Period  Weeks    Status  New    Target Date  10/18/18      PT LONG TERM GOAL #4   Title  Pt will be able to improve balance score, TBA     Time  8    Period  Weeks    Status   New    Target Date  10/18/18            Plan - 09/06/18 1031    Clinical Impression Statement  Pt without pain today as she entered clinic.  Back pain increased with sit to supine and sit despite max cues. Does well but speeds through.  Deconditioned and short of breath much of the session.     PT Treatment/Interventions  ADLs/Self Care Home Management;Cryotherapy;Ultrasound;Moist Heat;Gait training;Therapeutic exercise;Patient/family education;Manual techniques;Balance training;Stair training;Functional mobility training;Passive range of motion;Electrical Stimulation;Neuromuscular re-education;DME Instruction;Therapeutic activities    PT Next Visit Plan  BERG , practice rolling and supine to sit.  intro core     PT Home Exercise Plan  calf stretch,  sitting hamstring stretch, knee to chest, PPT and LTR    Consulted and Agree with Plan of Care  Patient       Patient will benefit from skilled therapeutic intervention in order to improve the following deficits and impairments:  Abnormal gait, Decreased balance, Decreased endurance, Decreased mobility, Difficulty walking, Hypomobility, Obesity, Improper body mechanics, Decreased range of motion, Decreased activity tolerance, Decreased strength, Increased fascial restricitons, Impaired flexibility, Impaired UE functional use, Postural dysfunction, Pain, Cardiopulmonary status limiting activity  Visit Diagnosis: Acute low back pain without sciatica, unspecified back pain laterality  Other abnormalities of gait and mobility  Muscle weakness (generalized)     Problem List Patient Active Problem List   Diagnosis Date Noted  . Unilateral primary osteoarthritis, left knee 07/21/2018  . Acute bilateral low back pain 07/21/2018  . Obesity (BMI 30.0-34.9) 06/27/2018  . Encounter for Medicare annual wellness exam 08/27/2016  . Peripheral neuropathy ([place]) 08/27/2016  . GERD (gastroesophageal reflux disease) 03/27/2016  . Subclinical  hyperthyroidism 08/18/2015  . 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 Gastric ulcer due to nonsteroidal antiinflammatory drug (NSAID) therapy   . Lung cancer ([place]) 05/09/2015  . Peripheral edema 05/01/2015  . Vitamin D deficiency 07/18/2014  . Medication management 07/18/2014  . Hyperlipidemia 04/04/2014  . Charcot's Right foot 06/15/2013  . Hypertension     Tiffany [name] 09/06/2018, 11:31 AM  [place], [place], [address] Phone: [phone]   Fax:  [phone]  Name: Tiffany [name] MRN: [id number] Date of Birth: 1946-11-07  Raeford Razor, PT 09/06/18 11:31 AM Phone: [phone] Fax: [phone]

## 2018-09-09 ENCOUNTER — Ambulatory Visit: Payer: Medicare Other | Admitting: Physical Therapy

## 2018-09-09 ENCOUNTER — Encounter: Payer: Self-pay | Admitting: Physical Therapy

## 2018-09-09 DIAGNOSIS — R2689 Other abnormalities of gait and mobility: Secondary | ICD-10-CM | POA: Diagnosis not present

## 2018-09-09 DIAGNOSIS — M545 Low back pain, unspecified: Secondary | ICD-10-CM

## 2018-09-09 DIAGNOSIS — M6281 Muscle weakness (generalized): Secondary | ICD-10-CM | POA: Diagnosis not present

## 2018-09-09 NOTE — Therapy (Signed)
[facility] [address] [place], [place], [address] Phone: [phone]   Fax:  [phone]  Physical Therapy Treatment  Patient Details  Name: Tiffany [name] MRN: [id number] Date of Birth: 26-May-1946 Referring Provider: Dr. Doneen Poisson    Encounter Date: 09/09/2018  PT End of Session - 09/09/18 1507    Visit Number  4    Number of Visits  16    Date for PT Re-Evaluation  10/18/18    PT Start Time  1505    PT Stop Time  1545    PT Time Calculation (min)  40 min    Activity Tolerance  Patient tolerated treatment well    Behavior During Therapy  [facility] for tasks assessed/performed       Past Medical History:  Diagnosis Date  . Anemia   . Arthritis   . Gait difficulty    problems with balance  . High blood pressure   . History of blood transfusion    05-14-15  . Hyperlipidemia   . Hyperthyroidism mild  . Neuropathy   . Obesity (BMI 35.0-39.9 without comorbidity)   . Pre-diabetes   . Vitamin D deficiency     Past Surgical History:  Procedure Laterality Date  . CERVICAL DISC ARTHROPLASTY    . CRYO INTERCOSTAL NERVE BLOCK Left 05/09/2015   Procedure: CRYO INTERCOSTAL NERVE BLOCK;  Surgeon: Loreli Slot, MD;  Location: [facility] OR;  Service: Thoracic;  Laterality: Left;  . ESOPHAGOGASTRODUODENOSCOPY N/A 05/16/2015   Procedure: ESOPHAGOGASTRODUODENOSCOPY (EGD);  Surgeon: Hilarie Fredrickson, MD;  Location: [facility] ENDOSCOPY;  Service: Endoscopy;  Laterality: N/A;  possible ablation of a bleeding lesion  . knee replaced     right knee x 2  . LOBECTOMY Left 05/09/2015   Procedure: LEFT LUNG UPPER LOBECTOMY;  Surgeon: Loreli Slot, MD;  Location: [facility] OR;  Service: Thoracic;  Laterality: Left;  . LYMPH NODE DISSECTION Left 05/09/2015   Procedure: LYMPH NODE DISSECTION;  Surgeon: Loreli Slot, MD;  Location: [facility] OR;  Service: Thoracic;  Laterality: Left;  . ROTATOR CUFF REPAIR Right   . SPINAL FUSION    .  TONSILLECTOMY  age 42  . TOTAL HIP ARTHROPLASTY Left 07/13/2015   Procedure: LEFT TOTAL HIP ARTHROPLASTY ANTERIOR APPROACH;  Surgeon: Kathryne Hitch, MD;  Location: [facility] ORS;  Service: Orthopedics;  Laterality: Left;  Marland Kitchen VAGINAL HYSTERECTOMY    . VIDEO ASSISTED THORACOSCOPY (VATS)/WEDGE RESECTION Left 05/09/2015   Procedure: LEFT VIDEO ASSISTED THORACOSCOPY (VATS) WITH LEFT LUNG UPPER LOBE WEDGE RESECTION;  Surgeon: Loreli Slot, MD;  Location: [facility] OR;  Service: Thoracic;  Laterality: Left;  Marland Kitchen VIDEO BRONCHOSCOPY WITH ENDOBRONCHIAL NAVIGATION N/A 04/18/2015   Procedure: VIDEO BRONCHOSCOPY WITH ENDOBRONCHIAL NAVIGATION;  Surgeon: Leslye Peer, MD;  Location: [facility] OR;  Service: Thoracic;  Laterality: N/A;  . VIDEO BRONCHOSCOPY WITH ENDOBRONCHIAL ULTRASOUND N/A 04/18/2015   Procedure: VIDEO BRONCHOSCOPY WITH ENDOBRONCHIAL ULTRASOUND;  Surgeon: Leslye Peer, MD;  Location: [facility] OR;  Service: Thoracic;  Laterality: N/A;    There were no vitals filed for this visit.  Subjective Assessment - 09/09/18 1506    Subjective  Back is fine.  I was so sore I could barely walk last time.      Currently in Pain?  Yes    Pain Score  3     Pain Location  Generalized         [facility] PT Assessment - 09/09/18 0001      Berg Balance  Test   Sit to Stand  Able to stand  independently using hands    Standing Unsupported  Able to stand 2 minutes with supervision   reaches out for UE support , very unsteady    Sitting with Back Unsupported but Feet Supported on Floor or Stool  Able to sit safely and securely 2 minutes    Stand to Sit  Controls descent by using hands    Transfers  Able to transfer with verbal cueing and /or supervision    Standing Unsupported with Eyes Closed  Able to stand 10 seconds with supervision    Standing Ubsupported with Feet Together  Needs help to attain position and unable to hold for 15 seconds   unable to attain position and needed supervision, steadying    From Standing,  Reach Forward with Outstretched Arm  Reaches forward but needs supervision    From Standing Position, Pick up Object from Floor  Unable to try/needs assist to keep balance    From Standing Position, Turn to Look Behind Over each Shoulder  Turn sideways only but maintains balance    Turn 360 Degrees  Needs assistance while turning    Standing Unsupported, Alternately Place Feet on Step/Stool  Needs assistance to keep from falling or unable to try    Standing Unsupported, One Foot in Front  Loses balance while stepping or standing    Standing on One Leg  Unable to try or needs assist to prevent fall    Total Score  21    Berg comment:  Uses RW, cane and furniture                    [facility] Adult PT Treatment/Exercise - 09/09/18 0001      Lumbar Exercises: Seated   Other Seated Lumbar Exercises  clam red band x 20       Knee/Hip Exercises: Stretches   Active Hamstring Stretch  Both;3 reps    Active Hamstring Stretch Limitations  added strap for calf       Knee/Hip Exercises: Seated   Long Arc Quad  Strengthening;Both;1 set;20 reps;Weights    Long Arc Quad Weight  4 lbs.    Marching  Strengthening;Both;1 set;10 reps;Weights    Marching Weights  3 lbs.    Hamstring Curl  1 set;10 reps    Hamstring Limitations  green band             PT Education - 09/09/18 1524    Education Details  Balance and recommendations    Person(s) Educated  Patient    Methods  Explanation    Comprehension  Verbalized understanding;Returned demonstration       PT Short Term Goals - 09/09/18 1539      PT SHORT TERM GOAL #1   Title  Pt will be I with initial HEP     Baseline  did not check supine back ex    Status  On-going      PT SHORT TERM GOAL #2   Title  Pt will complete balance assessment and set goal    Baseline  21/56    Status  Achieved      PT SHORT TERM GOAL #3   Title  Patient will be able to reports less back pain with transitions on and off bed/mat     Baseline   better at home     Status  Partially Met      PT SHORT TERM GOAL #4  Title  Pt will tolerate standing for LE exercise for 8 min or more with min increase in back pain.     Status  On-going        PT Long Term Goals - 08/23/18 1317      PT LONG TERM GOAL #1   Title  Pt will be able to score <40 % limited on FOTO to demo improved functional mobility     Time  8    Period  Weeks    Status  New    Target Date  10/18/18      PT LONG TERM GOAL #2   Title  Pt will be able to stand, walk for 10 min in the community with pain minimal in low back and RPE of 12 or less.      Time  8    Period  Weeks    Status  New    Target Date  10/18/18      PT LONG TERM GOAL #3   Title  Pt will be able to increase hip strength to 4+/5 in all planes to improve gait, transitions.     Time  8    Period  Weeks    Status  New    Target Date  10/18/18      PT LONG TERM GOAL #4   Title  Pt will be able to improve balance score, TBA     Time  8    Period  Weeks    Status  New    Target Date  10/18/18            Plan - 09/09/18 1535    Clinical Impression Statement  Patient with poor balance and 100% risk of falls without walker.  Fortunately she uses one whenever she is out of her house.  She needed min A for much of the test. Pt with alot of soreness post session Monday.  Urged her to try gentle stretching to ease pain .  She already feels stronger in her legs.  She may ask Dr. Magnus Ivan to see if we could work her Rt shoulder, injured it when she was in the hospital.      PT Treatment/Interventions  ADLs/Self Care Home Management;Cryotherapy;Ultrasound;Moist Heat;Gait training;Therapeutic exercise;Patient/family education;Manual techniques;Balance training;Stair training;Functional mobility training;Passive range of motion;Electrical Stimulation;Neuromuscular re-education;DME Instruction;Therapeutic activities    PT Next Visit Plan  add in LAQ, review stretches if willing to lie down, sees MD  Monday , may ask to have PT for Rt shoulder     PT Home Exercise Plan  calf stretch,  sitting hamstring stretch, knee to chest, PPT and LTR    Consulted and Agree with Plan of Care  Patient       Patient will benefit from skilled therapeutic intervention in order to improve the following deficits and impairments:  Abnormal gait, Decreased balance, Decreased endurance, Decreased mobility, Difficulty walking, Hypomobility, Obesity, Improper body mechanics, Decreased range of motion, Decreased activity tolerance, Decreased strength, Increased fascial restricitons, Impaired flexibility, Impaired UE functional use, Postural dysfunction, Pain, Cardiopulmonary status limiting activity  Visit Diagnosis: Acute low back pain without sciatica, unspecified back pain laterality  Other abnormalities of gait and mobility  Muscle weakness (generalized)     Problem List Patient Active Problem List   Diagnosis Date Noted  . Unilateral primary osteoarthritis, left knee 07/21/2018  . Acute bilateral low back pain 07/21/2018  . Obesity (BMI 30.0-34.9) 06/27/2018  . Encounter for Medicare annual wellness exam 08/27/2016  .  Peripheral neuropathy (HCC) 08/27/2016  . GERD (gastroesophageal reflux disease) 03/27/2016  . Subclinical hyperthyroidism 08/18/2015  . 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 Gastric ulcer due to nonsteroidal antiinflammatory drug (NSAID) therapy   . Lung cancer (HCC) 05/09/2015  . Peripheral edema 05/01/2015  . Vitamin D deficiency 07/18/2014  . Medication management 07/18/2014  . Hyperlipidemia 04/04/2014  . Charcot's Right foot 06/15/2013  . Hypertension     Yola Paradiso 09/09/2018, 3:50 PM  [facility] [address] [place], [place], [address] Phone: [phone]   Fax:  [phone]  Name: Tiffany [name] MRN: [id number] Date of Birth: April 27, 1946  Karie Mainland, PT 09/09/18 3:51 PM Phone:  [phone] Fax: [phone]

## 2018-09-13 ENCOUNTER — Encounter (INDEPENDENT_AMBULATORY_CARE_PROVIDER_SITE_OTHER): Payer: Self-pay | Admitting: Orthopaedic Surgery

## 2018-09-13 ENCOUNTER — Ambulatory Visit (INDEPENDENT_AMBULATORY_CARE_PROVIDER_SITE_OTHER): Payer: Medicare Other | Admitting: Orthopaedic Surgery

## 2018-09-13 ENCOUNTER — Ambulatory Visit (INDEPENDENT_AMBULATORY_CARE_PROVIDER_SITE_OTHER): Payer: Self-pay

## 2018-09-13 DIAGNOSIS — M5442 Lumbago with sciatica, left side: Secondary | ICD-10-CM

## 2018-09-13 DIAGNOSIS — M1712 Unilateral primary osteoarthritis, left knee: Secondary | ICD-10-CM | POA: Diagnosis not present

## 2018-09-13 DIAGNOSIS — M25521 Pain in right elbow: Secondary | ICD-10-CM | POA: Diagnosis not present

## 2018-09-13 DIAGNOSIS — M25511 Pain in right shoulder: Secondary | ICD-10-CM | POA: Diagnosis not present

## 2018-09-13 NOTE — Progress Notes (Signed)
Office Visit Note   Patient: Tiffany [name]           Date of Birth: [birth year]           MRN: [id number] Visit Date: 09/13/2018              Requested by: Unk Pinto, MD [address] [place], [place] [address] PCP: Unk Pinto, MD   Assessment & Plan: Visit Diagnoses:  1. Low back pain with left-sided sciatica, unspecified back pain laterality, unspecified chronicity   2. Unilateral primary osteoarthritis, left knee   3. Right shoulder pain, unspecified chronicity   4. Pain in right elbow     Plan: I would actually like my partner Dr. Marlou Sa to take a look at her shoulder and elbow on the right side in the near future.  He actually performed rotator cuff surgery on her right shoulder in 2010.  She is showing some deficiencies in the rotator cuff but there is a lot of grinding and arm pain at the elbow and some at the mid humerus level and this is become painful enough to her with the detrimental effects on her active daily living and her quality of life.  She still having around using a walker and it does put pressure on that shoulder and arm.  She is interested in seeing with Dr. Marlou Sa thinks about this as well in terms of what other intervention she may need even if it is just an injection.  We will work on getting her appointment set up with Dr. Marlou Sa.  Follow-Up Instructions: No follow-ups on file.   Orders:  Orders Placed This Encounter  Procedures  . XR Shoulder Right  . XR Elbow 2 Views Right   No orders of the defined types were placed in this encounter.     Procedures: No procedures performed   Clinical Data: No additional findings.   Subjective: Chief Complaint  Patient presents with  . Left Knee - Follow-up  The patient is well-known to me.  She is been going to physical therapy for her lumbar spine and her left knee.  She is a month out from a hyaluronic acid injection in her left knee.  She ablates with a rolling walker.  She says  her back in her knees are doing very well.  She has a new issue though.  She has been dealing with right shoulder and right elbow pain for some time now and some type of traumatic event happened to that shoulder or lower arm area near the elbow back when she was hospitalized with her lung issues.  She says she is having increasing problems with doing activities of daily living and performing things like washing her hair due to the inability to raise her right arm above her head.  She does have a remote history of rotator cuff surgery on that right shoulder as well.  HPI  Review of Systems She currently denies any headache, chest pain, short of breath, fever, chills, nausea, vomiting.  Objective: Vital Signs: There were no vitals taken for this visit.  Physical Exam She is alert and oriented x3 in no acute distress Ortho Exam Examination of her left knee is basically normal today with excellent range of motion and no effusion no pain.  She has negative straight leg raise bilaterally as far as her back goes she is usually doing better.  There is significant and severe pain with any attempted range of motion of the right shoulder  and right elbow and I can feel a grinding sensation in both areas.  There is significant weakness of the rotator cuff and she is more of the deltoid to move her shoulder on the right side. Specialty Comments:  No specialty comments available.  Imaging: Xr Elbow 2 Views Right  Result Date: 09/13/2018 2 views of the right elbow show well located elbow with significant joint space narrowing and arthritic changes.  Xr Shoulder Right  Result Date: 09/13/2018 3 views of the right shoulder show no acute findings.  The shoulder is well located.  It appears to be high riding on the AP view in terms of the humeral head and the glenoid however there seems to be adequate space on the subacromial outlet view and the axillary view.    PMFS History: Patient Active Problem List    Diagnosis Date Noted  . Unilateral primary osteoarthritis, left knee 07/21/2018  . Acute bilateral low back pain 07/21/2018  . Obesity (BMI 30.0-34.9) 06/27/2018  . Encounter for Medicare annual wellness exam 08/27/2016  . Peripheral neuropathy ([place]) 08/27/2016  . GERD (gastroesophageal reflux disease) 03/27/2016  . Subclinical hyperthyroidism 08/18/2015  . 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 Gastric ulcer due to nonsteroidal antiinflammatory drug (NSAID) therapy   . Lung cancer ([place]) 05/09/2015  . Peripheral edema 05/01/2015  . Vitamin D deficiency 07/18/2014  . Medication management 07/18/2014  . Hyperlipidemia 04/04/2014  . Charcot's Right foot 06/15/2013  . Hypertension    Past Medical History:  Diagnosis Date  . Anemia   . Arthritis   . Gait difficulty    problems with balance  . High blood pressure   . History of blood transfusion    05-14-15  . Hyperlipidemia   . Hyperthyroidism mild  . Neuropathy   . Obesity (BMI 35.0-39.9 without comorbidity)   . Pre-diabetes   . Vitamin D deficiency     Family History  Problem Relation Age of Onset  . Heart attack Father   . Hypertension Mother   . Breast cancer Maternal Aunt   . Colon cancer Paternal Grandmother     Past Surgical History:  Procedure Laterality Date  . CERVICAL DISC ARTHROPLASTY    . CRYO INTERCOSTAL NERVE BLOCK Left 05/09/2015   Procedure: CRYO INTERCOSTAL NERVE BLOCK;  Surgeon: [name], MD;  Location: [place];  Service: Thoracic;  Laterality: Left;  . ESOPHAGOGASTRODUODENOSCOPY N/A 05/16/2015   Procedure: ESOPHAGOGASTRODUODENOSCOPY (EGD);  Surgeon: Irene Shipper, MD;  Location: [facility] ENDOSCOPY;  Service: Endoscopy;  Laterality: N/A;  possible ablation of a bleeding lesion  . knee replaced     right knee x 2  . LOBECTOMY Left 05/09/2015   Procedure: LEFT LUNG UPPER LOBECTOMY;  Surgeon: [name], MD;  Location: [place];  Service: Thoracic;  Laterality: Left;  . LYMPH NODE  DISSECTION Left 05/09/2015   Procedure: LYMPH NODE DISSECTION;  Surgeon: [name], MD;  Location: [place];  Service: Thoracic;  Laterality: Left;  . ROTATOR CUFF REPAIR Right   . SPINAL FUSION    . TONSILLECTOMY  age 78  . TOTAL HIP ARTHROPLASTY Left 07/13/2015   Procedure: LEFT TOTAL HIP ARTHROPLASTY ANTERIOR APPROACH;  Surgeon: Mcarthur Rossetti, MD;  Location: [facility];  Service: Orthopedics;  Laterality: Left;  Marland Kitchen VAGINAL HYSTERECTOMY    . VIDEO ASSISTED THORACOSCOPY (VATS)/WEDGE RESECTION Left 05/09/2015   Procedure: LEFT VIDEO ASSISTED THORACOSCOPY (VATS) WITH LEFT LUNG UPPER LOBE WEDGE RESECTION;  Surgeon: [name],  MD;  Location: [place];  Service: Thoracic;  Laterality: Left;  Marland Kitchen VIDEO BRONCHOSCOPY WITH ENDOBRONCHIAL NAVIGATION N/A 04/18/2015   Procedure: VIDEO BRONCHOSCOPY WITH ENDOBRONCHIAL NAVIGATION;  Surgeon: Collene Gobble, MD;  Location: [place];  Service: Thoracic;  Laterality: N/A;  . VIDEO BRONCHOSCOPY WITH ENDOBRONCHIAL ULTRASOUND N/A 04/18/2015   Procedure: VIDEO BRONCHOSCOPY WITH ENDOBRONCHIAL ULTRASOUND;  Surgeon: Collene Gobble, MD;  Location: [place];  Service: Thoracic;  Laterality: N/A;   Social History   Occupational History  . Occupation: retired  Tobacco Use  . Smoking status: Former Smoker    Packs/day: 1.00    Years: 30.00    Pack years: 30.00    Types: Cigarettes    Last attempt to quit: 05/08/2015    Years since quitting: 3.3  . Smokeless tobacco: Never Used  Substance and Sexual Activity  . Alcohol use: Yes    Alcohol/week: 1.0 standard drinks    Types: 1 Glasses of wine per week    Comment: 1 glass wine  daily  . Drug use: No  . Sexual activity: Not on file

## 2018-09-14 ENCOUNTER — Encounter: Payer: Self-pay | Admitting: Physical Therapy

## 2018-09-14 ENCOUNTER — Ambulatory Visit: Payer: Medicare Other | Admitting: Physical Therapy

## 2018-09-14 DIAGNOSIS — R2689 Other abnormalities of gait and mobility: Secondary | ICD-10-CM | POA: Diagnosis not present

## 2018-09-14 DIAGNOSIS — M6281 Muscle weakness (generalized): Secondary | ICD-10-CM | POA: Diagnosis not present

## 2018-09-14 DIAGNOSIS — M545 Low back pain, unspecified: Secondary | ICD-10-CM

## 2018-09-14 NOTE — Therapy (Signed)
[place], [place], [address] Phone: [phone]   Fax:  [phone]  Physical Therapy Treatment  Patient Details  Name: Tiffany [name] MRN: [id number] Date of Birth: 05/15/1946 Referring Provider: Dr. Jean Rosenthal    Encounter Date: 09/14/2018  PT End of Session - 09/14/18 1601    Visit Number  5    Number of Visits  16    Date for PT Re-Evaluation  10/18/18    PT Start Time  1503    PT Stop Time  1545    PT Time Calculation (min)  42 min    Activity Tolerance  Patient tolerated treatment well    Behavior During Therapy  [facility] for tasks assessed/performed       Past Medical History:  Diagnosis Date  . Anemia   . Arthritis   . Gait difficulty    problems with balance  . High blood pressure   . History of blood transfusion    05-14-15  . Hyperlipidemia   . Hyperthyroidism mild  . Neuropathy   . Obesity (BMI 35.0-39.9 without comorbidity)   . Pre-diabetes   . Vitamin D deficiency     Past Surgical History:  Procedure Laterality Date  . CERVICAL DISC ARTHROPLASTY    . CRYO INTERCOSTAL NERVE BLOCK Left 05/09/2015   Procedure: CRYO INTERCOSTAL NERVE BLOCK;  Surgeon: [name], MD;  Location: [place];  Service: Thoracic;  Laterality: Left;  . ESOPHAGOGASTRODUODENOSCOPY N/A 05/16/2015   Procedure: ESOPHAGOGASTRODUODENOSCOPY (EGD);  Surgeon: Irene Shipper, MD;  Location[facility] ENDOSCOPY;  Service: Endoscopy;  Laterality: N/A;  possible ablation of a bleeding lesion  . knee replaced     right knee x 2  . LOBECTOMY Left 05/09/2015   Procedure: LEFT LUNG UPPER LOBECTOMY;  Surgeon: [name], MD;  Location: [place];  Service: Thoracic;  Laterality: Left;  . LYMPH NODE DISSECTION Left 05/09/2015   Procedure: LYMPH NODE DISSECTION;  Surgeon: [name], MD;  Location: [place];  Service: Thoracic;  Laterality: Left;  . ROTATOR CUFF REPAIR Right   . SPINAL FUSION    .  TONSILLECTOMY  age 77  . TOTAL HIP ARTHROPLASTY Left 07/13/2015   Procedure: LEFT TOTAL HIP ARTHROPLASTY ANTERIOR APPROACH;  Surgeon: Mcarthur Rossetti, MD;  Location: [facility];  Service: Orthopedics;  Laterality: Left;  Marland Kitchen VAGINAL HYSTERECTOMY    . VIDEO ASSISTED THORACOSCOPY (VATS)/WEDGE RESECTION Left 05/09/2015   Procedure: LEFT VIDEO ASSISTED THORACOSCOPY (VATS) WITH LEFT LUNG UPPER LOBE WEDGE RESECTION;  Surgeon: [name], MD;  Location: [place];  Service: Thoracic;  Laterality: Left;  Marland Kitchen VIDEO BRONCHOSCOPY WITH ENDOBRONCHIAL NAVIGATION N/A 04/18/2015   Procedure: VIDEO BRONCHOSCOPY WITH ENDOBRONCHIAL NAVIGATION;  Surgeon: Collene Gobble, MD;  Location: [place];  Service: Thoracic;  Laterality: N/A;  . VIDEO BRONCHOSCOPY WITH ENDOBRONCHIAL ULTRASOUND N/A 04/18/2015   Procedure: VIDEO BRONCHOSCOPY WITH ENDOBRONCHIAL ULTRASOUND;  Surgeon: Collene Gobble, MD;  Location: [place];  Service: Thoracic;  Laterality: N/A;    There were no vitals filed for this visit.  Subjective Assessment - 09/14/18 1509    Subjective  Saw MD.  he checked shoulder and it looked like the shoulder was a little more seperated.  he took an X ray.  I have an appointment with Dr Marlou Sa on the 30th to check shoulder.  my back is doing fine. I am not taking pain meds.  I don't  have back pain getting into bed I  have hypersensitivity in legs from knees down  with pain 8/10  Since 6 years ago,  ( as a result of clipped nerves in back for sciatica)      Currently in Pain?  No/denies    Pain Relieving Factors  Not sure,  exercise                       [facility] Adult PT Treatment/Exercise - 09/14/18 0001      Bed Mobility   Supine to Sit  Moderate Assistance - Patient 50-74%   uses hands to move legs with pivot.  hypersensitivity    Sit to Supine  Moderate Assistance - Patient 50-74%;Other (comment)   8/10 sit-sidelye- supine/ sit, 0/10 pivot      Lumbar Exercises: Stretches   Active Hamstring Stretch  3  reps;30 seconds   lt tighter than right   Active Hamstring Stretch Limitations  sitting    Single Knee to Chest Stretch  3 reps;30 seconds;Right;Left    Single Knee to Chest Stretch Limitations  ROM    Lower Trunk Rotation  10 seconds    Lower Trunk Rotation Limitations  x 10     Pelvic Tilt  10 reps   10 X,  some soreness of hypersensitivity touching on left.   Gastroc Stretch  3 reps;30 seconds    Gastroc Stretch Limitations  holding chair      Lumbar Exercises: Aerobic   Nustep  UE/LE  L 3 7 minutes       Knee/Hip Exercises: Standing   Heel Raises Limitations  unable to do, neuropathy    Hip Abduction  Stengthening;Both;1 set    Abduction Limitations  10 x each    Hip Extension  Stengthening;Both;1 set;10 reps    Forward Step Up  Both;1 set;10 reps;Hand Hold: 2;Step Height: 2"    Other Standing Knee Exercises  toe lifts 10 x  both             PT Education - 09/14/18 1555    Education Details  Bed transfer technique    Person(s) Educated  Patient    Methods  Explanation;Verbal cues;Tactile cues    Comprehension  Verbalized understanding;Need further instruction       PT Short Term Goals - 09/14/18 1612      PT SHORT TERM GOAL #1   Title  Pt will be I with initial HEP     Baseline  needs minimal cues    Time  4    Period  Weeks    Status  On-going      PT SHORT TERM GOAL #2   Title  Pt will complete balance assessment and set goal    Time  4    Period  Weeks    Status  Achieved      PT SHORT TERM GOAL #3   Title  Patient will be able to reports less back pain with transitions on and off bed/mat     Baseline  no pain if done sitting with pivot and use of hands to move legs.  8/10 pain when she uses correct technique in the gym.    Time  4    Period  Weeks    Status  On-going      PT SHORT TERM GOAL #4   Title  Pt will tolerate standing for LE exercise for 8 min or more with min increase in back pain.     Baseline  7 minutes  and 15 seconds,  no back pain     Time  4    Period  Weeks    Status  Partially Met        PT Long Term Goals - 08/23/18 1317      PT LONG TERM GOAL #1   Title  Pt will be able to score <40 % limited on FOTO to demo improved functional mobility     Time  8    Period  Weeks    Status  New    Target Date  10/18/18      PT LONG TERM GOAL #2   Title  Pt will be able to stand, walk for 10 min in the community with pain minimal in low back and RPE of 12 or less.      Time  8    Period  Weeks    Status  New    Target Date  10/18/18      PT LONG TERM GOAL #3   Title  Pt will be able to increase hip strength to 4+/5 in all planes to improve gait, transitions.     Time  8    Period  Weeks    Status  New    Target Date  10/18/18      PT LONG TERM GOAL #4   Title  Pt will be able to improve balance score, TBA     Time  8    Period  Weeks    Status  New    Target Date  10/18/18            Plan - 09/14/18 1601    Clinical Impression Statement  Entire HEP reviewed today,  minor cues needed.  She tolerated 7 minutes and 15 seconds standing today including calf stretch and other standing exercises see flow sheet.   No back pain at end of session.   She has no pain with bed transfers unless she tries to use good body mechanics,  then pain increases to 8/10.  Pain in in legs from knees down .  Legs are hypersensitive when they touch the table.Marland KitchenSTG#4 partially met.    PT Next Visit Plan  LAQ,  leg strengthening. increase standing/ walking time as able(  have a wash cloth handy for sweat)    PT Home Exercise Plan  calf stretch,  sitting hamstring stretch, knee to chest, PPT and LTR    Consulted and Agree with Plan of Care  Patient       Patient will benefit from skilled therapeutic intervention in order to improve the following deficits and impairments:     Visit Diagnosis: Acute low back pain without sciatica, unspecified back pain laterality  Other abnormalities of gait and mobility  Muscle weakness  (generalized)     Problem List Patient Active Problem List   Diagnosis Date Noted  . Unilateral primary osteoarthritis, left knee 07/21/2018  . Acute bilateral low back pain 07/21/2018  . Obesity (BMI 30.0-34.9) 06/27/2018  . Encounter for Medicare annual wellness exam 08/27/2016  . Peripheral neuropathy ([place]) 08/27/2016  . GERD (gastroesophageal reflux disease) 03/27/2016  . Subclinical hyperthyroidism 08/18/2015  . 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 Gastric ulcer due to nonsteroidal antiinflammatory drug (NSAID) therapy   . Lung cancer ([place]) 05/09/2015  . Peripheral edema 05/01/2015  . Vitamin D deficiency 07/18/2014  . Medication management 07/18/2014  . Hyperlipidemia 04/04/2014  . Charcot's Right foot 06/15/2013  . Hypertension  HARRIS,KAREN PTA 09/14/2018, 4:14 PM  [facility] [address] [place], [place], [address] Phone: [phone]   Fax:  [phone]  Name: Tiffany [name] MRN: [id number] Date of Birth: Dec 27, 1946

## 2018-09-16 ENCOUNTER — Encounter: Payer: Self-pay | Admitting: Physical Therapy

## 2018-09-16 ENCOUNTER — Ambulatory Visit: Payer: Medicare Other | Admitting: Physical Therapy

## 2018-09-16 ENCOUNTER — Encounter: Payer: Self-pay | Admitting: Physician Assistant

## 2018-09-16 DIAGNOSIS — R2689 Other abnormalities of gait and mobility: Secondary | ICD-10-CM | POA: Diagnosis not present

## 2018-09-16 DIAGNOSIS — M545 Low back pain, unspecified: Secondary | ICD-10-CM

## 2018-09-16 DIAGNOSIS — M6281 Muscle weakness (generalized): Secondary | ICD-10-CM

## 2018-09-16 NOTE — Therapy (Signed)
[place], [place], [address] Phone: [phone]   Fax:  [phone]  Physical Therapy Treatment  Patient Details  Name: Tiffany [name] MRN: [id number] Date of Birth: 1946/01/22 Referring Provider: Dr. Jean Rosenthal    Encounter Date: 09/16/2018  PT End of Session - 09/16/18 1540    Visit Number  6    Number of Visits  16    Date for PT Re-Evaluation  10/18/18    PT Start Time  1500    PT Stop Time  1546    PT Time Calculation (min)  46 min    Activity Tolerance  Patient tolerated treatment well    Behavior During Therapy  [facility] for tasks assessed/performed       Past Medical History:  Diagnosis Date  . Anemia   . Arthritis   . Gait difficulty    problems with balance  . High blood pressure   . History of blood transfusion    05-14-15  . Hyperlipidemia   . Hyperthyroidism mild  . Neuropathy   . Obesity (BMI 35.0-39.9 without comorbidity)   . Pre-diabetes   . Vitamin D deficiency     Past Surgical History:  Procedure Laterality Date  . CERVICAL DISC ARTHROPLASTY    . CRYO INTERCOSTAL NERVE BLOCK Left 05/09/2015   Procedure: CRYO INTERCOSTAL NERVE BLOCK;  Surgeon: [name], MD;  Location: [place];  Service: Thoracic;  Laterality: Left;  . ESOPHAGOGASTRODUODENOSCOPY N/A 05/16/2015   Procedure: ESOPHAGOGASTRODUODENOSCOPY (EGD);  Surgeon: Irene Shipper, MD;  Location: [facility] ENDOSCOPY;  Service: Endoscopy;  Laterality: N/A;  possible ablation of a bleeding lesion  . knee replaced     right knee x 2  . LOBECTOMY Left 05/09/2015   Procedure: LEFT LUNG UPPER LOBECTOMY;  Surgeon: [name], MD;  Location: [place];  Service: Thoracic;  Laterality: Left;  . LYMPH NODE DISSECTION Left 05/09/2015   Procedure: LYMPH NODE DISSECTION;  Surgeon: [name], MD;  Location: [place];  Service: Thoracic;  Laterality: Left;  . ROTATOR CUFF REPAIR Right   . SPINAL FUSION    .  TONSILLECTOMY  age 23  . TOTAL HIP ARTHROPLASTY Left 07/13/2015   Procedure: LEFT TOTAL HIP ARTHROPLASTY ANTERIOR APPROACH;  Surgeon: Mcarthur Rossetti, MD;  Location: [facility];  Service: Orthopedics;  Laterality: Left;  Marland Kitchen VAGINAL HYSTERECTOMY    . VIDEO ASSISTED THORACOSCOPY (VATS)/WEDGE RESECTION Left 05/09/2015   Procedure: LEFT VIDEO ASSISTED THORACOSCOPY (VATS) WITH LEFT LUNG UPPER LOBE WEDGE RESECTION;  Surgeon: [name], MD;  Location: [place];  Service: Thoracic;  Laterality: Left;  Marland Kitchen VIDEO BRONCHOSCOPY WITH ENDOBRONCHIAL NAVIGATION N/A 04/18/2015   Procedure: VIDEO BRONCHOSCOPY WITH ENDOBRONCHIAL NAVIGATION;  Surgeon: Collene Gobble, MD;  Location: [place];  Service: Thoracic;  Laterality: N/A;  . VIDEO BRONCHOSCOPY WITH ENDOBRONCHIAL ULTRASOUND N/A 04/18/2015   Procedure: VIDEO BRONCHOSCOPY WITH ENDOBRONCHIAL ULTRASOUND;  Surgeon: Collene Gobble, MD;  Location: [place];  Service: Thoracic;  Laterality: N/A;    There were no vitals filed for this visit.         [facility] Adult PT Treatment/Exercise - 09/16/18 0001      Ambulation/Gait   Gait Comments  2 Min walk 207 feet , safe but slow       Lumbar Exercises: Aerobic   Nustep  LE L5 for 6 min       Lumbar Exercises: Supine   Pelvic Tilt  10 reps  Pelvic Tilt Limitations  with ball squeeze       Knee/Hip Exercises: Stretches   Piriformis Stretch  Both;5 reps;Other (comment)    Piriformis Stretch Limitations  figure 4 rock       Knee/Hip Exercises: Standing   Heel Raises Limitations  unable to do, neuropathy    Hip Flexion  Stengthening;Both;1 set    Hip Flexion Limitations  x 15 march with walker       Knee/Hip Exercises: Seated   Long Arc Quad  Strengthening;Both;1 set;20 reps;Weights    Long Arc Quad Weight  4 lbs.    Marching  Strengthening;Both;1 set;10 reps;Weights    Sit to [employer]  10 reps;with UE support      Knee/Hip Exercises: Supine   Heel Slides  Strengthening;Both;1 set;10 reps    Heel Slides  Limitations  used swiss ball     Bridges  Strengthening;Both;1 set    Bridges Limitations  able to do small ROM x 8    Other Supine Knee/Hip Exercises  clam green x 15 reps                PT Short Term Goals - 09/14/18 1612      PT SHORT TERM GOAL #1   Title  Pt will be I with initial HEP     Baseline  needs minimal cues    Time  4    Period  Weeks    Status  On-going      PT SHORT TERM GOAL #2   Title  Pt will complete balance assessment and set goal    Time  4    Period  Weeks    Status  Achieved      PT SHORT TERM GOAL #3   Title  Patient will be able to reports less back pain with transitions on and off bed/mat     Baseline  no pain if done sitting with pivot and use of hands to move legs.  8/10 pain when she uses correct technique in the gym.    Time  4    Period  Weeks    Status  On-going      PT SHORT TERM GOAL #4   Title  Pt will tolerate standing for LE exercise for 8 min or more with min increase in back pain.     Baseline  7 minutes and 15 seconds,  no back pain    Time  4    Period  Weeks    Status  Partially Met        PT Long Term Goals - 08/23/18 1317      PT LONG TERM GOAL #1   Title  Pt will be able to score <40 % limited on FOTO to demo improved functional mobility     Time  8    Period  Weeks    Status  New    Target Date  10/18/18      PT LONG TERM GOAL #2   Title  Pt will be able to stand, walk for 10 min in the community with pain minimal in low back and RPE of 12 or less.      Time  8    Period  Weeks    Status  New    Target Date  10/18/18      PT LONG TERM GOAL #3   Title  Pt will be able to increase hip strength to 4+/5 in all planes to improve  gait, transitions.     Time  8    Period  Weeks    Status  New    Target Date  10/18/18      PT LONG TERM GOAL #4   Title  Pt will be able to improve balance score, TBA     Time  8    Period  Weeks    Status  New    Target Date  10/18/18            Plan - 09/16/18  1541    Clinical Impression Statement  No back pain except for transfers on and off mat.  She will cont next week until her appt with Dr.Dean.  May add in shoulder depending on what he says. Needs A with rolling independently because manual hurts her.      PT Treatment/Interventions  ADLs/Self Care Home Management;Cryotherapy;Ultrasound;Moist Heat;Gait training;Therapeutic exercise;Patient/family education;Manual techniques;Balance training;Stair training;Functional mobility training;Passive range of motion;Electrical Stimulation;Neuromuscular re-education;DME Instruction;Therapeutic activities    PT Next Visit Plan  endurance, LAQ,  leg strengthening. increase standing/ walking time as able(  have a wash cloth handy for sweat)    PT Home Exercise Plan  calf stretch,  sitting hamstring stretch, knee to chest, PPT and LTR, green band for clam     Consulted and Agree with Plan of Care  Patient       Patient will benefit from skilled therapeutic intervention in order to improve the following deficits and impairments:  Abnormal gait, Decreased balance, Decreased endurance, Decreased mobility, Difficulty walking, Hypomobility, Obesity, Improper body mechanics, Decreased range of motion, Decreased activity tolerance, Decreased strength, Increased fascial restricitons, Impaired flexibility, Impaired UE functional use, Postural dysfunction, Pain, Cardiopulmonary status limiting activity  Visit Diagnosis: Acute low back pain without sciatica, unspecified back pain laterality  Other abnormalities of gait and mobility  Muscle weakness (generalized)     Problem List Patient Active Problem List   Diagnosis Date Noted  . Unilateral primary osteoarthritis, left knee 07/21/2018  . Acute bilateral low back pain 07/21/2018  . Obesity (BMI 30.0-34.9) 06/27/2018  . Encounter for Medicare annual wellness exam 08/27/2016  . Peripheral neuropathy ([place]) 08/27/2016  . GERD (gastroesophageal reflux disease)  03/27/2016  . Subclinical hyperthyroidism 08/18/2015  . 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 Gastric ulcer due to nonsteroidal antiinflammatory drug (NSAID) therapy   . Lung cancer ([place]) 05/09/2015  . Peripheral edema 05/01/2015  . Vitamin D deficiency 07/18/2014  . Medication management 07/18/2014  . Hyperlipidemia 04/04/2014  . Charcot's Right foot 06/15/2013  . Hypertension     PAA,JENNIFER 09/16/2018, 3:54 PM  [place], [place], [address] Phone: [phone]   Fax:  [phone]  Name: Tiffany [name] MRN: [id number] Date of Birth: 03/30/1946   Raeford Razor, PT 09/16/18 3:54 PM Phone: [phone] Fax: [phone]

## 2018-09-20 ENCOUNTER — Encounter: Payer: Self-pay | Admitting: Physical Therapy

## 2018-09-20 ENCOUNTER — Ambulatory Visit: Payer: Medicare Other | Admitting: Physical Therapy

## 2018-09-20 DIAGNOSIS — M6281 Muscle weakness (generalized): Secondary | ICD-10-CM

## 2018-09-20 DIAGNOSIS — M545 Low back pain, unspecified: Secondary | ICD-10-CM

## 2018-09-20 DIAGNOSIS — R2689 Other abnormalities of gait and mobility: Secondary | ICD-10-CM | POA: Diagnosis not present

## 2018-09-20 NOTE — Therapy (Signed)
[facility] [address] [place], [place], [address] Phone: [phone]   Fax:  [phone]  Physical Therapy Treatment  Patient Details  Name: Tiffany [name] MRN: [id number] Date of Birth: 1946/09/04 Referring Provider: Dr. Doneen Poisson    Encounter Date: 09/20/2018  PT End of Session - 09/20/18 1132    Visit Number  7    Number of Visits  16    Date for PT Re-Evaluation  10/18/18    PT Start Time  1100    PT Stop Time  1145    PT Time Calculation (min)  45 min    Activity Tolerance  Patient tolerated treatment well    Behavior During Therapy  [facility] for tasks assessed/performed       Past Medical History:  Diagnosis Date  . Anemia   . Arthritis   . Gait difficulty    problems with balance  . High blood pressure   . History of blood transfusion    05-14-15  . Hyperlipidemia   . Hyperthyroidism mild  . Neuropathy   . Obesity (BMI 35.0-39.9 without comorbidity)   . Pre-diabetes   . Vitamin D deficiency     Past Surgical History:  Procedure Laterality Date  . CERVICAL DISC ARTHROPLASTY    . CRYO INTERCOSTAL NERVE BLOCK Left 05/09/2015   Procedure: CRYO INTERCOSTAL NERVE BLOCK;  Surgeon: Loreli Slot, MD;  Location: [facility] OR;  Service: Thoracic;  Laterality: Left;  . ESOPHAGOGASTRODUODENOSCOPY N/A 05/16/2015   Procedure: ESOPHAGOGASTRODUODENOSCOPY (EGD);  Surgeon: Hilarie Fredrickson, MD;  Location: [facility] ENDOSCOPY;  Service: Endoscopy;  Laterality: N/A;  possible ablation of a bleeding lesion  . knee replaced     right knee x 2  . LOBECTOMY Left 05/09/2015   Procedure: LEFT LUNG UPPER LOBECTOMY;  Surgeon: Loreli Slot, MD;  Location: [facility] OR;  Service: Thoracic;  Laterality: Left;  . LYMPH NODE DISSECTION Left 05/09/2015   Procedure: LYMPH NODE DISSECTION;  Surgeon: Loreli Slot, MD;  Location: [facility] OR;  Service: Thoracic;  Laterality: Left;  . ROTATOR CUFF REPAIR Right   . SPINAL FUSION    .  TONSILLECTOMY  age 27  . TOTAL HIP ARTHROPLASTY Left 07/13/2015   Procedure: LEFT TOTAL HIP ARTHROPLASTY ANTERIOR APPROACH;  Surgeon: Kathryne Hitch, MD;  Location: [facility] ORS;  Service: Orthopedics;  Laterality: Left;  Marland Kitchen VAGINAL HYSTERECTOMY    . VIDEO ASSISTED THORACOSCOPY (VATS)/WEDGE RESECTION Left 05/09/2015   Procedure: LEFT VIDEO ASSISTED THORACOSCOPY (VATS) WITH LEFT LUNG UPPER LOBE WEDGE RESECTION;  Surgeon: Loreli Slot, MD;  Location: [facility] OR;  Service: Thoracic;  Laterality: Left;  Marland Kitchen VIDEO BRONCHOSCOPY WITH ENDOBRONCHIAL NAVIGATION N/A 04/18/2015   Procedure: VIDEO BRONCHOSCOPY WITH ENDOBRONCHIAL NAVIGATION;  Surgeon: Leslye Peer, MD;  Location: [facility] OR;  Service: Thoracic;  Laterality: N/A;  . VIDEO BRONCHOSCOPY WITH ENDOBRONCHIAL ULTRASOUND N/A 04/18/2015   Procedure: VIDEO BRONCHOSCOPY WITH ENDOBRONCHIAL ULTRASOUND;  Surgeon: Leslye Peer, MD;  Location: [facility] OR;  Service: Thoracic;  Laterality: N/A;    There were no vitals filed for this visit.      [facility] Adult PT Treatment/Exercise - 09/20/18 0001      Ambulation/Gait   Gait Comments  3:45, 405 feet       Self-Care   Other Self-Care Comments   post PT plans for continued exercise, options, group, [employer], aquatics, silver sneaker       Lumbar Exercises: Aerobic   Nustep  walked instead for cardio  Lumbar Exercises: Seated   Other Seated Lumbar Exercises  seated core breathing ball press x 10    slow, cues to exhale and inhale fully      Knee/Hip Exercises: Standing   Hip Flexion  Stengthening;Both;1 set    Hip Flexion Limitations  x 15 march with walker     Hip Abduction  Stengthening;Both;1 set    Abduction Limitations  10 x each    Hip Extension  Stengthening;Both;1 set;10 reps   4 lbs    Other Standing Knee Exercises  mini squat x 10       Knee/Hip Exercises: Seated   Long Arc Quad  Strengthening;Both;1 set;20 reps;Weights    Long Arc Quad Weight  4 lbs.   with ball    Ball Squeeze  x 10      Marching  Strengthening;Both;1 set;10 reps;Weights    [employer]  4 lbs.    Hamstring Curl  1 set;10 reps    Hamstring Limitations  green band    Sit to Sand  10 reps;with UE support             PT Education - 09/20/18 1128    Education Details  continuing to exercise post PT, group class    Person(s) Educated  Patient    Methods  Explanation    Comprehension  Verbalized understanding;Returned demonstration       PT Short Term Goals - 09/14/18 1612      PT SHORT TERM GOAL #1   Title  Pt will be I with initial HEP     Baseline  needs minimal cues    Time  4    Period  Weeks    Status  On-going      PT SHORT TERM GOAL #2   Title  Pt will complete balance assessment and set goal    Time  4    Period  Weeks    Status  Achieved      PT SHORT TERM GOAL #3   Title  Patient will be able to reports less back pain with transitions on and off bed/mat     Baseline  no pain if done sitting with pivot and use of hands to move legs.  8/10 pain when she uses correct technique in the gym.    Time  4    Period  Weeks    Status  On-going      PT SHORT TERM GOAL #4   Title  Pt will tolerate standing for LE exercise for 8 min or more with min increase in back pain.     Baseline  7 minutes and 15 seconds,  no back pain    Time  4    Period  Weeks    Status  Partially Met        PT Long Term Goals - 08/23/18 1317      PT LONG TERM GOAL #1   Title  Pt will be able to score <40 % limited on FOTO to demo improved functional mobility     Time  8    Period  Weeks    Status  New    Target Date  10/18/18      PT LONG TERM GOAL #2   Title  Pt will be able to stand, walk for 10 min in the community with pain minimal in low back and RPE of 12 or less.      Time  8    Period  Weeks    Status  New    Target Date  10/18/18      PT LONG TERM GOAL #3   Title  Pt will be able to increase hip strength to 4+/5 in all planes to improve gait, transitions.     Time  8    Period   Weeks    Status  New    Target Date  10/18/18      PT LONG TERM GOAL #4   Title  Pt will be able to improve balance score, TBA     Time  8    Period  Weeks    Status  New    Target Date  10/18/18            Plan - 09/20/18 1127    Clinical Impression Statement  Worked in sitting and standing on LE strength and endurance.  Back did not hurt during her session. Needed multiple rest and water breaks .  Educated on pursed lip breathing technique to slow breathing  when winded.     PT Treatment/Interventions  ADLs/Self Care Home Management;Cryotherapy;Ultrasound;Moist Heat;Gait training;Therapeutic exercise;Patient/family education;Manual techniques;Balance training;Stair training;Functional mobility training;Passive range of motion;Electrical Stimulation;Neuromuscular re-education;DME Instruction;Therapeutic activities    PT Next Visit Plan  endurance, LAQ,  leg strengthening. increase standing/ walking time as able(  have a wash cloth handy for sweat)    PT Home Exercise Plan  calf stretch,  sitting hamstring stretch, knee to chest, PPT and LTR, green band for clam     Consulted and Agree with Plan of Care  Patient       Patient will benefit from skilled therapeutic intervention in order to improve the following deficits and impairments:  Abnormal gait, Decreased balance, Decreased endurance, Decreased mobility, Difficulty walking, Hypomobility, Obesity, Improper body mechanics, Decreased range of motion, Decreased activity tolerance, Decreased strength, Increased fascial restricitons, Impaired flexibility, Impaired UE functional use, Postural dysfunction, Pain, Cardiopulmonary status limiting activity  Visit Diagnosis: Acute low back pain without sciatica, unspecified back pain laterality  Other abnormalities of gait and mobility  Muscle weakness (generalized)     Problem List Patient Active Problem List   Diagnosis Date Noted  . Unilateral primary osteoarthritis, left knee  07/21/2018  . Acute bilateral low back pain 07/21/2018  . Obesity (BMI 30.0-34.9) 06/27/2018  . Encounter for Medicare annual wellness exam 08/27/2016  . Peripheral neuropathy (HCC) 08/27/2016  . GERD (gastroesophageal reflux disease) 03/27/2016  . Subclinical hyperthyroidism 08/18/2015  . 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 Gastric ulcer due to nonsteroidal antiinflammatory drug (NSAID) therapy   . Lung cancer (HCC) 05/09/2015  . Peripheral edema 05/01/2015  . Vitamin D deficiency 07/18/2014  . Medication management 07/18/2014  . Hyperlipidemia 04/04/2014  . Charcot's Right foot 06/15/2013  . Hypertension     Mikaiah Stoffer 09/20/2018, 11:52 AM  [facility] [address] [place], [place], [address] Phone: [phone]   Fax:  [phone]  Name: LUCELIA BLAIZE MRN: [id number] Date of Birth: [birth year]  Karie Mainland, PT 09/20/18 11:52 AM Phone: [phone] Fax: [phone]

## 2018-09-23 ENCOUNTER — Encounter: Payer: Self-pay | Admitting: Physical Therapy

## 2018-09-23 ENCOUNTER — Ambulatory Visit: Payer: Medicare Other | Admitting: Physical Therapy

## 2018-09-23 DIAGNOSIS — M6281 Muscle weakness (generalized): Secondary | ICD-10-CM

## 2018-09-23 DIAGNOSIS — R2689 Other abnormalities of gait and mobility: Secondary | ICD-10-CM | POA: Diagnosis not present

## 2018-09-23 DIAGNOSIS — M545 Low back pain, unspecified: Secondary | ICD-10-CM

## 2018-09-23 NOTE — Therapy (Signed)
[place], [place], [address] Phone: [phone]   Fax:  [phone]  Physical Therapy Treatment/Discharge   Patient Details  Name: Tiffany [name] MRN: [id number] Date of Birth: 1946/09/08 Referring Provider (PT): Dr. Jean Rosenthal    Encounter Date: 09/23/2018  PT End of Session - 09/23/18 1539    Visit Number  8    Number of Visits  16    Date for PT Re-Evaluation  10/18/18    PT Start Time  1502    PT Stop Time  1543    PT Time Calculation (min)  41 min    Activity Tolerance  Patient tolerated treatment well    Behavior During Therapy  [facility] for tasks assessed/performed       Past Medical History:  Diagnosis Date  . Anemia   . Arthritis   . Gait difficulty    problems with balance  . High blood pressure   . History of blood transfusion    05-14-15  . Hyperlipidemia   . Hyperthyroidism mild  . Neuropathy   . Obesity (BMI 35.0-39.9 without comorbidity)   . Pre-diabetes   . Vitamin D deficiency     Past Surgical History:  Procedure Laterality Date  . CERVICAL DISC ARTHROPLASTY    . CRYO INTERCOSTAL NERVE BLOCK Left 05/09/2015   Procedure: CRYO INTERCOSTAL NERVE BLOCK;  Surgeon: [name], MD;  Location: [place];  Service: Thoracic;  Laterality: Left;  . ESOPHAGOGASTRODUODENOSCOPY N/A 05/16/2015   Procedure: ESOPHAGOGASTRODUODENOSCOPY (EGD);  Surgeon: Irene Shipper, MD;  Location: [facility] ENDOSCOPY;  Service: Endoscopy;  Laterality: N/A;  possible ablation of a bleeding lesion  . knee replaced     right knee x 2  . LOBECTOMY Left 05/09/2015   Procedure: LEFT LUNG UPPER LOBECTOMY;  Surgeon: [name], MD;  Location: [place];  Service: Thoracic;  Laterality: Left;  . LYMPH NODE DISSECTION Left 05/09/2015   Procedure: LYMPH NODE DISSECTION;  Surgeon: [name], MD;  Location: [place];  Service: Thoracic;  Laterality: Left;  . ROTATOR CUFF REPAIR Right   . SPINAL FUSION     . TONSILLECTOMY  age 61  . TOTAL HIP ARTHROPLASTY Left 07/13/2015   Procedure: LEFT TOTAL HIP ARTHROPLASTY ANTERIOR APPROACH;  Surgeon: Mcarthur Rossetti, MD;  Location: [facility];  Service: Orthopedics;  Laterality: Left;  Marland Kitchen VAGINAL HYSTERECTOMY    . VIDEO ASSISTED THORACOSCOPY (VATS)/WEDGE RESECTION Left 05/09/2015   Procedure: LEFT VIDEO ASSISTED THORACOSCOPY (VATS) WITH LEFT LUNG UPPER LOBE WEDGE RESECTION;  Surgeon: [name], MD;  Location: [place];  Service: Thoracic;  Laterality: Left;  Marland Kitchen VIDEO BRONCHOSCOPY WITH ENDOBRONCHIAL NAVIGATION N/A 04/18/2015   Procedure: VIDEO BRONCHOSCOPY WITH ENDOBRONCHIAL NAVIGATION;  Surgeon: Collene Gobble, MD;  Location: [place];  Service: Thoracic;  Laterality: N/A;  . VIDEO BRONCHOSCOPY WITH ENDOBRONCHIAL ULTRASOUND N/A 04/18/2015   Procedure: VIDEO BRONCHOSCOPY WITH ENDOBRONCHIAL ULTRASOUND;  Surgeon: Collene Gobble, MD;  Location: [place];  Service: Thoracic;  Laterality: N/A;    There were no vitals filed for this visit.  Subjective Assessment - 09/23/18 1516    Subjective  Last day  Agreeable to DC.  Sees Dr. Marlou Sa Monday.  May return for shoulder pain. No pain today in her back, shoulder is fine too as long as she doesnt use it much     Currently in Pain?  No/denies         [facility] PT Assessment - 09/23/18 0001  Observation/Other Assessments   Focus on Therapeutic Outcomes (FOTO)   35%      AROM   Lumbar Flexion  WFL no pain     Lumbar Extension  felt good in sitting     Lumbar - Right Rotation  50%     Lumbar - Left Rotation  50%       Strength   Right Hip Flexion  4/5    Left Hip Flexion  4/5    Right Knee Flexion  4+/5    Right Knee Extension  4+/5    Left Knee Flexion  4+/5    Left Knee Extension  4+/5       [facility] Adult PT Treatment/Exercise - 09/23/18 0001      Lumbar Exercises: Stretches   Active Hamstring Stretch  3 reps;30 seconds   lt tighter than right   Active Hamstring Stretch Limitations  sitting     Gastroc Stretch  3 reps;30 seconds      Lumbar Exercises: Aerobic   Nustep  LE 8 min L5 UE and LE       Lumbar Exercises: Seated   Other Seated Lumbar Exercises  sit to stand to walker x 10 needs UE       Knee/Hip Exercises: Stretches   Active Hamstring Stretch  Both;2 reps;30 seconds      Knee/Hip Exercises: Standing   Hip Flexion  Stengthening;Both;1 set;20 reps;Knee bent    Hip Abduction  Stengthening;Both;1 set    Abduction Limitations  10 x each    Hip Extension  Stengthening;Both;1 set;10 reps    Other Standing Knee Exercises  adduction x 10 , all above done in parallel bars              PT Education - 09/23/18 1539    Education Details  hip exercises, POC     Person(s) Educated  Patient    Methods  Explanation    Comprehension  Verbalized understanding;Returned demonstration       PT Short Term Goals - 09/23/18 1638      PT SHORT TERM GOAL #1   Title  Pt will be I with initial HEP     Status  Achieved      PT SHORT TERM GOAL #2   Title  Pt will complete balance assessment and set goal    Status  Achieved      PT SHORT TERM GOAL #3   Title  Patient will be able to reports less back pain with transitions on and off bed/mat     Status  Achieved      PT SHORT TERM GOAL #4   Title  Pt will tolerate standing for LE exercise for 8 min or more with min increase in back pain.     Status  Achieved        PT Long Term Goals - 09/23/18 1638      PT LONG TERM GOAL #1   Title  Pt will be able to score <40 % limited on FOTO to demo improved functional mobility     Status  Achieved      PT LONG TERM GOAL #2   Title  Pt will be able to stand, walk for 10 min in the community with pain minimal in low back and RPE of 12 or less.      Baseline  not due to back pain, but endurance     Status  Partially Met      PT LONG  TERM GOAL #3   Title  Pt will be able to increase hip strength to 4+/5 in all planes to improve gait, transitions.     Baseline  hip flexion met      Status  Partially Met      PT LONG TERM GOAL #4   Title  Pt will be able to improve balance score, TBA     Baseline  balance due to neuropathy, patient did not really want to work on that .  USes walker and knows her limits     Status  Deferred            Plan - 09/23/18 1636    Clinical Impression Statement  SPoke aout POC.  She feels she will likely need surgery.  She has not had any back pain recently.  Does not exercise in supine.  Added standing hip/endurance work that she has already been doing.  She was encouraged to keep exercising to maintiain the strength she has gained.  FOTO score improved to 35% which is better than predicted. DC from PT>     PT Treatment/Interventions  ADLs/Self Care Home Management;Cryotherapy;Ultrasound;Moist Heat;Gait training;Therapeutic exercise;Patient/family education;Manual techniques;Balance training;Stair training;Functional mobility training;Passive range of motion;Electrical Stimulation;Neuromuscular re-education;DME Instruction;Therapeutic activities    PT Next Visit Plan  NA     PT Home Exercise Plan  calf stretch,  sitting hamstring stretch, knee to chest, PPT and LTR, green band for clam     Consulted and Agree with Plan of Care  Patient       Patient will benefit from skilled therapeutic intervention in order to improve the following deficits and impairments:  Abnormal gait, Decreased balance, Decreased endurance, Decreased mobility, Difficulty walking, Hypomobility, Obesity, Improper body mechanics, Decreased range of motion, Decreased activity tolerance, Decreased strength, Increased fascial restricitons, Impaired flexibility, Impaired UE functional use, Postural dysfunction, Pain, Cardiopulmonary status limiting activity  Visit Diagnosis: Acute low back pain without sciatica, unspecified back pain laterality  Other abnormalities of gait and mobility  Muscle weakness (generalized)     Problem List Patient Active Problem List    Diagnosis Date Noted  . Unilateral primary osteoarthritis, left knee 07/21/2018  . Acute bilateral low back pain 07/21/2018  . Obesity (BMI 30.0-34.9) 06/27/2018  . Encounter for Medicare annual wellness exam 08/27/2016  . Peripheral neuropathy ([place]) 08/27/2016  . GERD (gastroesophageal reflux disease) 03/27/2016  . Subclinical hyperthyroidism 08/18/2015  . 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 Gastric ulcer due to nonsteroidal antiinflammatory drug (NSAID) therapy   . Lung cancer ([place]) 05/09/2015  . Peripheral edema 05/01/2015  . Vitamin D deficiency 07/18/2014  . Medication management 07/18/2014  . Hyperlipidemia 04/04/2014  . Charcot's Right foot 06/15/2013  . Hypertension     PAA,JENNIFER 09/23/2018, 4:39 PM  [place], [place], [address] Phone: [phone]   Fax:  [phone]  Name: LAREEN MULLINGS MRN: [id number] Date of Birth: 07-19-1946  PHYSICAL THERAPY DISCHARGE SUMMARY  Visits from Start of Care: 8  Current functional level related to goals / functional outcomes: See above    Remaining deficits: Balance, weakness, pain, ROM    Education / Equipment: HEP, fall risk Plan: Patient agrees to discharge.  Patient goals were partially met. Patient is being discharged due to being pleased with the current functional level.  ?????    Raeford Razor, PT 09/23/18 4:41 PM Phone: [phone] Fax: [phone]

## 2018-09-27 ENCOUNTER — Encounter (INDEPENDENT_AMBULATORY_CARE_PROVIDER_SITE_OTHER): Payer: Self-pay | Admitting: Orthopedic Surgery

## 2018-09-27 ENCOUNTER — Ambulatory Visit (INDEPENDENT_AMBULATORY_CARE_PROVIDER_SITE_OTHER): Payer: Medicare Other | Admitting: Orthopedic Surgery

## 2018-09-27 DIAGNOSIS — M19011 Primary osteoarthritis, right shoulder: Secondary | ICD-10-CM

## 2018-09-29 ENCOUNTER — Encounter (INDEPENDENT_AMBULATORY_CARE_PROVIDER_SITE_OTHER): Payer: Self-pay | Admitting: Orthopedic Surgery

## 2018-09-29 NOTE — Progress Notes (Signed)
Office Visit Note   Patient: Tiffany [name]           Date of Birth: [birth year]           MRN: [id number] Visit Date: 09/27/2018 Requested by: Unk Pinto, [address] [place], [place] [id number] PCP: Unk Pinto, MD  Subjective: Chief Complaint  Patient presents with  . Right Shoulder - Pain    HPI: Tiffany [name] is a patient with right shoulder and elbow pain.  She had right shoulder rotator cuff repair done in 2010.  She states that when she was in her wheelchair recovering from other surgeries earlier this year and last year something tore in the shoulder.  She reports significant pain since that time.  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  She has more functional limitation than pain.  She does use a cane and walker and has some balance issues so I would say that her shoulder is at least a partially weightbearing shoulder.              ROS: All systems reviewed are negative as they relate to the chief complaint within the history of present illness.  Patient denies  fevers or chills.   Assessment & Plan: Visit Diagnoses:  1. Arthritis of right shoulder region     Plan: Impression is likely recurrent rotator cuff tearing and arthritis in that right shoulder.  She has diminished forward flexion abduction and external rotation.  Plan thin cut CT of the right shoulder for preop reverse shoulder replacement.  Her deltoid is functional.  I will see her back after that study but  Follow-Up Instructions: No follow-ups on file.   Orders:  Orders Placed This Encounter  Procedures  . CT SHOULDER RIGHT WO CONTRAST   No orders of the defined types were placed in this encounter.     Procedures: No procedures performed   Clinical Data: No additional findings.  Objective: Vital Signs: There were no vitals taken for this visit.  Physical Exam:   Constitutional: Patient appears well-developed HEENT:  Head: Normocephalic Eyes:EOM are normal Neck:  Normal range of motion Cardiovascular: Normal rate Pulmonary/chest: Effort normal Neurologic: Patient is alert Skin: Skin is warm Psychiatric: Patient has normal mood and affect    Ortho Exam: Ortho exam demonstrates full active and passive range of motion of the cervical spine.  On the right shoulder she has only about 30 degrees of active forward flexion only about 60 degrees of active abduction and diminished passive external rotation.  Deltoid is functional.  Motor or sensory function to the hand is intact.  Radial pulses intact.  I do watch her walk and she does use a cane and walker.  She is mostly weightbearing through the shoulder when she is getting up from a seated position.  Specialty Comments:  No specialty comments available.  Imaging: No results found.   PMFS History: Patient Active Problem List   Diagnosis Date Noted  . Unilateral primary osteoarthritis, left knee 07/21/2018  . Acute bilateral low back pain 07/21/2018  . Obesity (BMI 30.0-34.9) 06/27/2018  . Encounter for Medicare annual wellness exam 08/27/2016  . Peripheral neuropathy ([place]) 08/27/2016  . GERD (gastroesophageal reflux disease) 03/27/2016  . Subclinical hyperthyroidism 08/18/2015  . 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 Gastric ulcer due to nonsteroidal antiinflammatory drug (NSAID) therapy   . Lung cancer ([place]) 05/09/2015  . Peripheral edema 05/01/2015  . Vitamin D deficiency 07/18/2014  .  Medication management 07/18/2014  . Hyperlipidemia 04/04/2014  . Charcot's Right foot 06/15/2013  . Hypertension    Past Medical History:  Diagnosis Date  . Anemia   . Arthritis   . Gait difficulty    problems with balance  . High blood pressure   . History of blood transfusion    05-14-15  . Hyperlipidemia   . Hyperthyroidism mild  . Neuropathy   . Obesity (BMI 35.0-39.9 without comorbidity)   . Pre-diabetes   . Vitamin D deficiency     Family History  Problem Relation Age of  Onset  . Heart attack Father   . Hypertension Mother   . Breast cancer Maternal Aunt   . Colon cancer Paternal Grandmother     Past Surgical History:  Procedure Laterality Date  . CERVICAL DISC ARTHROPLASTY    . CRYO INTERCOSTAL NERVE BLOCK Left 05/09/2015   Procedure: CRYO INTERCOSTAL NERVE BLOCK;  Surgeon: [name], MD;  Location: [facility];  Service: Thoracic;  Laterality: Left;  . ESOPHAGOGASTRODUODENOSCOPY N/A 05/16/2015   Procedure: ESOPHAGOGASTRODUODENOSCOPY (EGD);  Surgeon: Irene Shipper, MD;  [facility] ENDOSCOPY;  Service: Endoscopy;  Laterality: N/A;  possible ablation of a bleeding lesion  . knee replaced     right knee x 2  . LOBECTOMY Left 05/09/2015   Procedure: LEFT LUNG UPPER LOBECTOMY;  Surgeon: [name], MD;  Location: [facility];  Service: Thoracic;  Laterality: Left;  . LYMPH NODE DISSECTION Left 05/09/2015   Procedure: LYMPH NODE DISSECTION;  Surgeon: [name], MD;  Location: [facility];  Service: Thoracic;  Laterality: Left;  . ROTATOR CUFF REPAIR Right   . SPINAL FUSION    . TONSILLECTOMY  age 45  . TOTAL HIP ARTHROPLASTY Left 07/13/2015   Procedure: LEFT TOTAL HIP ARTHROPLASTY ANTERIOR APPROACH;  Surgeon: Mcarthur Rossetti, MD;  Location: [facility];  Service: Orthopedics;  Laterality: Left;  Marland Kitchen VAGINAL HYSTERECTOMY    . VIDEO ASSISTED THORACOSCOPY (VATS)/WEDGE RESECTION Left 05/09/2015   Procedure: LEFT VIDEO ASSISTED THORACOSCOPY (VATS) WITH LEFT LUNG UPPER LOBE WEDGE RESECTION;  Surgeon: [name], MD;  Location: [facility];  Service: Thoracic;  Laterality: Left;  Marland Kitchen VIDEO BRONCHOSCOPY WITH ENDOBRONCHIAL NAVIGATION N/A 04/18/2015   Procedure: VIDEO BRONCHOSCOPY WITH ENDOBRONCHIAL NAVIGATION;  Surgeon: Collene Gobble, MD;  Location: [facility];  Service: Thoracic;  Laterality: N/A;  . VIDEO BRONCHOSCOPY WITH ENDOBRONCHIAL ULTRASOUND N/A 04/18/2015   Procedure: VIDEO BRONCHOSCOPY WITH ENDOBRONCHIAL ULTRASOUND;  Surgeon: Collene Gobble, MD;   Location: [facility];  Service: Thoracic;  Laterality: N/A;   Social History   Occupational History  . Occupation: retired  Tobacco Use  . Smoking status: Former Smoker    Packs/day: 1.00    Years: 30.00    Pack years: 30.00    Types: Cigarettes    Last attempt to quit: 05/08/2015    Years since quitting: 3.3  . Smokeless tobacco: Never Used  Substance and Sexual Activity  . Alcohol use: Yes    Alcohol/week: 1.0 standard drinks    Types: 1 Glasses of wine per week    Comment: 1 glass wine  daily  . Drug use: No  . Sexual activity: Not on file

## 2018-10-04 ENCOUNTER — Ambulatory Visit
Admission: RE | Admit: 2018-10-04 | Discharge: 2018-10-04 | Disposition: A | Discharge status: Home / Self Care | Payer: Medicare Other | Source: Ambulatory Visit | Attending: Orthopedic Surgery | Admitting: Orthopedic Surgery

## 2018-10-04 DIAGNOSIS — M19011 Primary osteoarthritis, right shoulder: Secondary | ICD-10-CM

## 2018-10-11 ENCOUNTER — Telehealth (INDEPENDENT_AMBULATORY_CARE_PROVIDER_SITE_OTHER): Payer: Self-pay

## 2018-10-11 NOTE — Telephone Encounter (Signed)
Please complete dictation under plan from [place] on 09/30

## 2018-10-13 ENCOUNTER — Encounter: Payer: Self-pay | Admitting: Physician Assistant

## 2018-10-13 NOTE — Telephone Encounter (Signed)
Ok to proceed with scheduling.

## 2018-10-13 NOTE — Telephone Encounter (Signed)
I think the CT scan is good.  Plan for reverse shoulder replacement.  Can you call her and let her know that we will get her on the schedule and we can bring her back in to talk more about it if she wants.  We did discuss that at last clinic visit.

## 2018-10-13 NOTE — Progress Notes (Signed)
MEDICARE ANNUAL WELLNESS VISIT AND FOLLOW UP  Assessment:   Essential hypertension - continue medications, DASH diet, exercise and monitor at home. Call if greater than 130/80.  -     CBC with Differential/Platelet -     BASIC METABOLIC PANEL WITH GFR -     Hepatic function panel -     TSH -     Urinalysis, Routine w reflex microscopic (not at [facility]) -     Microalbumin / creatinine urine ratio -     EKG 12-Lead  Malignant neoplasm of upper lobe of left lung (HCC) -     Get CT chest follow up  Subclinical hyperthyroidism -     TSH - will continue to monitor, no symptoms, has had normal RAI  Gastroesophageal reflux disease, esophagitis presence not specified Continue PPI/H2 blocker, diet discussed  Gastric ulcer due to nonsteroidal antiinflammatory drug (NSAID) therapy Avoid NSAIDS  Charcot's Right foot Follows pain management  Vitamin D deficiency Continue supplement  Peripheral edema Continue lasix  Medication management -     Magnesium  Hyperlipidemia -     Lipid panel -continue medications, check lipids, decrease fatty foods, increase activity.   Encounter for Medicare annual wellness exam  Need for vaccination for H flu type B -     Flu vaccine HIGH DOSE PF  Other polyneuropathy ([place]) No meds at this time  Tremor With intention, has family history Try gabapentin for neuropathy and tremor  Senile purpura Discussed process, protect skin, sunscreen  Over 40 minutes of exam, counseling, chart review and critical decision making was performed Future Appointments  Date Time Provider [place]  11/22/2018 11:50 AM [facility] 2 [facility]  10/31/2019 10:00 AM Vicie Mutters, PA-C [facility] None     Plan:   During the course of the visit the patient was educated and counseled about appropriate screening and preventive services including:    Pneumococcal vaccine   Prevnar 13  Influenza vaccine  Td vaccine  Screening  electrocardiogram  Bone densitometry screening  Colorectal cancer screening  Diabetes screening  Glaucoma screening  Nutrition counseling   Advanced directives: requested   Subjective:  Tiffany [name] is a 72 y.o. female who presents for Medicare Annual Wellness Visit and follow up.    Her blood pressure has been controlled at home, today their BP is BP: 124/82 She does not workout. She denies chest pain, shortness of breath, dizziness.   Patient had VATS May 2016 by Dr. Roxan Hockey for LUL primary adenocarcinoma, no chemo/radiation. She has not had a follow up lung CT.  She walks with cane for balance occ a walker, she has done PT and states it has helped her strength, not her balance.   She is following with Dr. Marlou Sa for her right shoulder, pay need replacement.  She has resting tremor x 2012, started with neuropathy, worse with movement, writing, denies tremor at rest. Father had tremor as well. She is on atenolol.   She is on cholesterol medication and denies myalgias. Her cholesterol is at goal. The cholesterol last visit was:   Lab Results  Component Value Date   CHOL 203 (H) 06/28/2018   HDL 85 06/28/2018   LDLCALC 94 06/28/2018   TRIG 139 06/28/2018   CHOLHDL 2.4 06/28/2018   Last A1C Lab Results  Component Value Date   HGBA1C 4.8 04/01/2017   Last GFR: Lab Results  Component Value Date   GFRNONAA 57 (L) 06/28/2018   Patient is on Vitamin  D supplement.   Lab Results  Component Value Date   VD25OH 94 04/01/2017     BMI is Body mass index is 35.83 kg/m., she is working on diet and exercise. Wt Readings from Last 3 Encounters:  10/18/18 222 lb (100.7 kg)  06/28/18 228 lb (103.4 kg)  03/29/18 222 lb (100.7 kg)   Medication Review: Current Outpatient Medications on File Prior to Visit  Medication Sig Dispense Refill  . atenolol (TENORMIN) 100 MG tablet TAKE 1 TABLET BY MOUTH DAILY FOR BLOOD PRESSURE 90 tablet 0  . Cholecalciferol (VITAMIN D PO) Take  5,000 Units by mouth 2 (two) times daily.     . diphenhydrAMINE (BENADRYL) 25 MG tablet Take 25 mg by mouth every 6 (six) hours as needed for allergies.     Marland Kitchen enalapril (VASOTEC) 20 MG tablet TAKE ONE TABLET BY MOUTH DAILY 90 tablet 1  . fluticasone (FLONASE) 50 MCG/ACT nasal spray USE 1 OR 2 SPRAYS PER NOSTRIL DAILY AS NEEDED FOR ALLERGY SYMPTOMS AND CONGESTION 48 g 3  . furosemide (LASIX) 40 MG tablet Take 1 tablet daily for BP & Fluid Retention 90 tablet 0  . HYDROcodone-acetaminophen (NORCO/VICODIN) 5-325 MG tablet Take 1-2 tablets by mouth every 6 (six) hours as needed for moderate pain. 40 tablet 0  . Magnesium 250 MG TABS Take by mouth.    . meloxicam (MOBIC) 15 MG tablet TAKE ONE TABLET BY MOUTH DAILY WITH FOOD FOR PAIN AND INFLAMMATION 90 tablet 1  . methocarbamol (ROBAXIN) 500 MG tablet Take 1 tablet (500 mg total) by mouth every 6 (six) hours as needed for muscle spasms. 60 tablet 1  . methylPREDNISolone (MEDROL) 4 MG tablet Medrol dose pack. Take as instructed 21 tablet 0  . Multiple Vitamins-Minerals (MULTIVITAMIN PO) Take 1 tablet by mouth at bedtime.     Marland Kitchen nystatin (NYSTATIN) powder Apply topically 4 (four) times daily. 15 g 0  . nystatin cream (MYCOSTATIN) Apply 1 application topically 2 (two) times daily. 30 g 1  . pravastatin (PRAVACHOL) 40 MG tablet TAKE ONE TABLET BY MOUTH EVERY NIGHT AT BEDTIME 90 tablet 0  . ranitidine (ZANTAC) 300 MG tablet TAKE ONE TABLET BY MOUTH DAILY FOR ACID INDIGESTION AND REFLUX 90 tablet 0   No current facility-administered medications on file prior to visit.     Allergies  Allergen Reactions  . Other Other (See Comments)    Stool softeners caused stomach bloating, nausea, vomiting bleeding ulcer and blood transfusion    Current Problems (verified) Patient Active Problem List   Diagnosis Date Noted  . Unilateral primary osteoarthritis, left knee 07/21/2018  . Peripheral neuropathy ([place]) 08/27/2016  . GERD (gastroesophageal reflux disease)  03/27/2016  . Subclinical hyperthyroidism 08/18/2015  . Gastric ulcer due to nonsteroidal antiinflammatory drug (NSAID) therapy   . Lung cancer ([place]) 05/09/2015  . Peripheral edema 05/01/2015  . Vitamin D deficiency 07/18/2014  . Medication management 07/18/2014  . Hyperlipidemia 04/04/2014  . Charcot's Right foot 06/15/2013  . Hypertension     Screening Tests Immunization History  Administered Date(s) Administered  . Influenza Split 11/22/2013  . Influenza, High Dose Seasonal PF 11/29/2015, 08/27/2016, 09/14/2017, 10/18/2018  . Pneumococcal Conjugate-13 03/27/2016  . Pneumococcal Polysaccharide-23 10/01/1999, 09/14/2017  . Td 10/18/2018  . Tdap 05/29/2008  . Zoster 10/09/2011   Preventative care: Last colonoscopy: 2008 declines, willing to do cologuard- will send next week EGD 2016 PFTs 03/2015 Last mammogram: 10/2014 DUE SHE HAS SET UP NOV 25th CXR 02/2017  Last pap smear/pelvic exam:  remote declines DEXA:10/2014 Thyroid 2011  Prior vaccinations: TD or Tdap: 2009 TODAY Influenza: 2018 TODAY Pneumococcal: 2018 Prevnar13: 2017 Shingles/Zostavax: 2012  Names of Other Physician/Practitioners you currently use: 1. [facility] here for primary care 2. Sabra Heck, eye doctor, last visit 2014 3. L[facility] Pro dentist, last visit 2017 Patient Care Team: Unk Pinto, MD as PCP - General (Internal Medicine) Mcarthur Rossetti, MD as Consulting Physician (Orthopedic Surgery) [name], MD as Consulting Physician (Cardiothoracic Surgery) Collene Gobble, MD as Consulting Physician (Pulmonary Disease) Marlou Sa Tonna Corner, MD as Consulting Physician (Orthopedic Surgery) Irene Shipper, MD as Consulting Physician (Gastroenterology)  SURGICAL HISTORY She  has a past surgical history that includes Vaginal hysterectomy; Cervical disc arthroplasty; Rotator cuff repair (Right); Spinal fusion; knee replaced; Video bronchoscopy with  endobronchial navigation (N/A, 04/18/2015); Video bronchoscopy with endobronchial ultrasound (N/A, 04/18/2015); Video assisted thoracoscopy (vats)/wedge resection (Left, 05/09/2015); Lobectomy (Left, 05/09/2015); Cryo intercostal nerve block (Left, 05/09/2015); Lymph node dissection (Left, 05/09/2015); Esophagogastroduodenoscopy (N/A, 05/16/2015); Tonsillectomy (age 13); and Total hip arthroplasty (Left, 07/13/2015). FAMILY HISTORY Her family history includes Breast cancer in her maternal aunt; Colon cancer in her paternal grandmother; Heart attack in her father; Hypertension in her mother. SOCIAL HISTORY She  reports that she quit smoking about 3 years ago. Her smoking use included cigarettes. She has a 30.00 pack-year smoking history. She has never used smokeless tobacco. She reports that she drinks about 1.0 standard drinks of alcohol per week. She reports that she does not use drugs.  MEDICARE WELLNESS OBJECTIVES: Physical activity: Current Exercise Habits: The patient does not participate in regular exercise at present Cardiac risk factors: Cardiac Risk Factors include: advanced age (>14men, >45 women);dyslipidemia;hypertension;obesity (BMI >30kg/m2);sedentary lifestyle Depression/mood screen:   Depression screen [facility] 2/9 10/18/2018  Decreased Interest 0  Down, Depressed, Hopeless 0  PHQ - 2 Score 0    ADLs:  In your present state of health, do you have any difficulty performing the following activities: 10/18/2018  Hearing? N  Vision? N  Difficulty concentrating or making decisions? N  Walking or climbing stairs? Y  Dressing or bathing? N  Doing errands, shopping? N  Some recent data might be hidden    Cognitive Testing  Alert? Yes  Normal Appearance?Yes  Oriented to person? Yes  Place? Yes   Time? Yes  Recall of three objects?  Yes  Can perform simple calculations? Yes  Displays appropriate judgment?Yes  Can read the correct time from a watch face?Yes  EOL planning: Does Patient Have a  Medical Advance Directive?: No Would patient like information on creating a medical advance directive?: No - Patient declined(She has paperwork at home)  Review of Systems  Constitutional: Negative for fever.  HENT: Negative for congestion, ear pain and sore throat.   Eyes: Negative.   Respiratory: Negative for cough, shortness of breath and wheezing.   Cardiovascular: Positive for leg swelling. Negative for chest pain and palpitations.  Gastrointestinal: Negative for blood in stool, constipation, diarrhea, heartburn and melena.  Genitourinary: Negative.   Musculoskeletal: Positive for back pain and joint pain. Negative for falls.  Skin: Negative.   Neurological: Positive for sensory change (bilateral feet). Negative for dizziness, loss of consciousness and headaches.  Psychiatric/Behavioral: Negative for depression. The patient is not nervous/anxious and does not have insomnia.      Objective:     Today's Vitals   10/18/18 1054  BP: 124/82  Pulse: 83  Resp: 16  Temp: (!) 97.3 F (36.3 C)  SpO2: 95%  Weight: 222 lb (100.7 kg)  Height: 5\' 6"  (1.676 m)   Body mass index is 35.83 kg/m.  General appearance: alert, no distress, WD/WN, female HEENT: normocephalic, sclerae anicteric, TMs pearly, nares patent, no discharge or erythema, pharynx normal Oral cavity: MMM, no lesions Neck: supple, no lymphadenopathy, no thyromegaly, no masses Heart: RRR, normal S1, S2, no murmurs Lungs: CTA bilaterally, no wheezes, rhonchi, or rales Abdomen: +bs, soft, non tender, non distended, no masses, no hepatomegaly, no splenomegaly Musculoskeletal: nontender, no swelling, no obvious deformity Extremities: no edema, no cyanosis, no clubbing Pulses: 2+ symmetric, upper and lower extremities, normal cap refill Neurological: alert, oriented x 3, CN2-12 intact, strength normal upper extremities and lower extremities, sensation normal throughout, DTRs 2+ throughout, no cerebellar signs, gait  imbalanced, walks with cane, bilateral hand tremor, worse with intention, no cog wheeling Psychiatric: normal affect, behavior normal, pleasant   Medicare Attestation I have personally reviewed: The patient's medical and social history Their use of alcohol, tobacco or illicit drugs Their current medications and supplements The patient's functional ability including ADLs,fall risks, home safety risks, cognitive, and hearing and visual impairment Diet and physical activities Evidence for depression or mood disorders  The patient's weight, height, BMI, and visual acuity have been recorded in the chart.  I have made referrals, counseling, and provided education to the patient based on review of the above and I have provided the patient with a written personalized care plan for preventive services.     Vicie Mutters, PA-C   10/18/2018

## 2018-10-14 ENCOUNTER — Other Ambulatory Visit: Payer: Self-pay | Admitting: Internal Medicine

## 2018-10-14 DIAGNOSIS — Z1231 Encounter for screening mammogram for malignant neoplasm of breast: Secondary | ICD-10-CM

## 2018-10-18 ENCOUNTER — Encounter: Payer: Self-pay | Admitting: Physician Assistant

## 2018-10-18 ENCOUNTER — Ambulatory Visit (INDEPENDENT_AMBULATORY_CARE_PROVIDER_SITE_OTHER): Payer: Medicare Other | Admitting: Physician Assistant

## 2018-10-18 VITALS — BP 124/82 | HR 83 | Temp 97.3°F | Resp 16 | Ht 66.0 in | Wt 222.0 lb

## 2018-10-18 DIAGNOSIS — Z23 Encounter for immunization: Secondary | ICD-10-CM | POA: Diagnosis not present

## 2018-10-18 DIAGNOSIS — E0590 Thyrotoxicosis, unspecified without thyrotoxic crisis or storm: Secondary | ICD-10-CM

## 2018-10-18 DIAGNOSIS — E559 Vitamin D deficiency, unspecified: Secondary | ICD-10-CM | POA: Diagnosis not present

## 2018-10-18 DIAGNOSIS — Z0001 Encounter for general adult medical examination with abnormal findings: Secondary | ICD-10-CM | POA: Diagnosis not present

## 2018-10-18 DIAGNOSIS — I10 Essential (primary) hypertension: Secondary | ICD-10-CM | POA: Diagnosis not present

## 2018-10-18 DIAGNOSIS — M1712 Unilateral primary osteoarthritis, left knee: Secondary | ICD-10-CM | POA: Diagnosis not present

## 2018-10-18 DIAGNOSIS — R609 Edema, unspecified: Secondary | ICD-10-CM | POA: Diagnosis not present

## 2018-10-18 DIAGNOSIS — E782 Mixed hyperlipidemia: Secondary | ICD-10-CM

## 2018-10-18 DIAGNOSIS — Z79899 Other long term (current) drug therapy: Secondary | ICD-10-CM | POA: Diagnosis not present

## 2018-10-18 DIAGNOSIS — R6889 Other general symptoms and signs: Secondary | ICD-10-CM

## 2018-10-18 DIAGNOSIS — C3412 Malignant neoplasm of upper lobe, left bronchus or lung: Secondary | ICD-10-CM

## 2018-10-18 DIAGNOSIS — K259 Gastric ulcer, unspecified as acute or chronic, without hemorrhage or perforation: Secondary | ICD-10-CM | POA: Diagnosis not present

## 2018-10-18 DIAGNOSIS — G6289 Other specified polyneuropathies: Secondary | ICD-10-CM

## 2018-10-18 DIAGNOSIS — M14671 Charcot's joint, right ankle and foot: Secondary | ICD-10-CM

## 2018-10-18 DIAGNOSIS — K219 Gastro-esophageal reflux disease without esophagitis: Secondary | ICD-10-CM | POA: Diagnosis not present

## 2018-10-18 DIAGNOSIS — T39395A Adverse effect of other nonsteroidal anti-inflammatory drugs [NSAID], initial encounter: Secondary | ICD-10-CM

## 2018-10-18 DIAGNOSIS — D692 Other nonthrombocytopenic purpura: Secondary | ICD-10-CM | POA: Insufficient documentation

## 2018-10-18 DIAGNOSIS — R251 Tremor, unspecified: Secondary | ICD-10-CM

## 2018-10-18 DIAGNOSIS — Z0000 Encounter for general adult medical examination without abnormal findings: Secondary | ICD-10-CM

## 2018-10-18 MED ORDER — FAMOTIDINE 40 MG PO TABS: 40.0000 mg | ORAL_TABLET | Freq: Every evening | ORAL | 1 refills | Status: DC

## 2018-10-18 MED ORDER — GABAPENTIN 300 MG PO CAPS: 300.0000 mg | ORAL_CAPSULE | Freq: Three times a day (TID) | ORAL | 2 refills | Status: DC

## 2018-10-18 NOTE — Patient Instructions (Addendum)
Glad you are getting your mammogram done!! Don't forget to send off for the cologuard.   Start on the gabapentin for the tremor/pain Start slow- on one pill a night for 1 week, then can do 1 pill in the morning and at night for 1-2 weeks.  Can do 2 at night and 1 in the morning for 2 weeks And see how you are doing Goal is 3-4 pills a day to help tremor  Please call the office if you have any side effects.   Common side effects are sleepiness, concentration problems, dizziness, swelling.  You may get drowsy or dizzy. Do not drive, use machinery, or do anything that needs mental alertness until you know how this medicine affects you. Do not stand or sit up quickly, especially if you are an older patient. This reduces the risk of dizzy or fainting spells. Alcohol may interfere with the effect of this medicine. Avoid alcoholic drinks. If you have a heart condition, like congestive heart failure, and notice that you are retaining water and have swelling in your hands or feet, contact your health care provider immediately.     When it comes to diets, agreement about the perfect plan isn't easy to find, even among the experts. Experts at the [employer] developed an idea known as the Healthy Eating Plate. Just imagine a plate divided into logical, healthy portions.  The emphasis is on diet quality:  Load up on vegetables and fruits - one-half of your plate: Aim for color and variety, and remember that potatoes don't count.  Go for whole grains - one-quarter of your plate: Whole wheat, barley, wheat berries, quinoa, oats, brown rice, and foods made with them. If you want pasta, go with whole wheat pasta.  Protein power - one-quarter of your plate: Fish, chicken, beans, and nuts are all healthy, versatile protein sources. Limit red meat.  The diet, however, does go beyond the plate, offering a few other suggestions.  Use healthy plant oils, such as olive, canola, soy, corn,  sunflower and peanut. Check the labels, and avoid partially hydrogenated oil, which have unhealthy trans fats.  If you're thirsty, drink water. Coffee and tea are good in moderation, but skip sugary drinks and limit milk and dairy products to one or two daily servings.  The type of carbohydrate in the diet is more important than the amount. Some sources of carbohydrates, such as vegetables, fruits, whole grains, and beans-are healthier than others.  Finally, stay active.

## 2018-10-19 LAB — CBC WITH DIFFERENTIAL/PLATELET
BASOS PCT: 0.9 %
Basophils Absolute: 73 cells/uL (ref 0–200)
EOS PCT: 2.1 %
Eosinophils Absolute: 170 cells/uL (ref 15–500)
HEMATOCRIT: 41.4 % (ref 35.0–45.0)
HEMOGLOBIN: 13.3 g/dL (ref 11.7–15.5)
Lymphs Abs: 1879 cells/uL (ref 850–3900)
MCH: 30.3 pg (ref 27.0–33.0)
MCHC: 32.1 g/dL (ref 32.0–36.0)
MCV: 94.3 fL (ref 80.0–100.0)
MONOS PCT: 8.2 %
MPV: 10 fL (ref 7.5–12.5)
NEUTROS ABS: 5314 {cells}/uL (ref 1500–7800)
Neutrophils Relative %: 65.6 %
Platelets: 370 10*3/uL (ref 140–400)
RBC: 4.39 10*6/uL (ref 3.80–5.10)
RDW: 15.2 % — ABNORMAL HIGH (ref 11.0–15.0)
Total Lymphocyte: 23.2 %
WBC mixed population: 664 cells/uL (ref 200–950)
WBC: 8.1 10*3/uL (ref 3.8–10.8)

## 2018-10-19 LAB — COMPLETE METABOLIC PANEL WITH GFR
AG Ratio: 1.4 (calc) (ref 1.0–2.5)
ALBUMIN MSPROF: 4.1 g/dL (ref 3.6–5.1)
ALT: 14 U/L (ref 6–29)
AST: 16 U/L (ref 10–35)
Alkaline phosphatase (APISO): 116 U/L (ref 33–130)
BUN: 14 mg/dL (ref 7–25)
CALCIUM: 9.9 mg/dL (ref 8.6–10.4)
CO2: 28 mmol/L (ref 20–32)
CREATININE: 0.93 mg/dL (ref 0.50–0.99)
Chloride: 99 mmol/L (ref 98–110)
GFR, EST NON AFRICAN AMERICAN: 63 mL/min/{1.73_m2} (ref >=60)
GFR, Est African American: 73 mL/min/{1.73_m2} (ref >=60)
GLOBULIN: 2.9 g/dL (ref 1.9–3.7)
GLUCOSE: 105 mg/dL — AB (ref 65–99)
Potassium: 4.3 mmol/L (ref 3.5–5.3)
Sodium: 138 mmol/L (ref 135–146)
TOTAL PROTEIN: 7 g/dL (ref 6.1–8.1)
Total Bilirubin: 0.6 mg/dL (ref 0.2–1.2)

## 2018-10-19 LAB — LIPID PANEL
Cholesterol: 196 mg/dL (ref <200)
HDL: 79 mg/dL (ref >50)
LDL CHOLESTEROL (CALC): 93 mg/dL
Non-HDL Cholesterol (Calc): 117 mg/dL (calc) (ref <130)
TRIGLYCERIDES: 142 mg/dL (ref <150)
Total CHOL/HDL Ratio: 2.5 (calc) (ref <5.0)

## 2018-10-19 LAB — TSH: TSH: 0.06 m[IU]/L — AB (ref 0.40–4.50)

## 2018-10-19 LAB — MAGNESIUM: MAGNESIUM: 1.9 mg/dL (ref 1.5–2.5)

## 2018-10-22 DIAGNOSIS — I10 Essential (primary) hypertension: Secondary | ICD-10-CM | POA: Diagnosis not present

## 2018-10-22 NOTE — Addendum Note (Signed)
Addended by: Vicie Mutters R on: 10/22/2018 12:06 PM   Modules accepted: Orders

## 2018-10-23 LAB — URINALYSIS, ROUTINE W REFLEX MICROSCOPIC
Bilirubin Urine: NEGATIVE
Glucose, UA: NEGATIVE
Hgb urine dipstick: NEGATIVE
Ketones, ur: NEGATIVE
LEUKOCYTES UA: NEGATIVE
Nitrite: NEGATIVE
PH: 6.5 (ref 5.0–8.0)
Protein, ur: NEGATIVE
SPECIFIC GRAVITY, URINE: 1.009 (ref 1.001–1.03)

## 2018-10-23 LAB — MICROALBUMIN / CREATININE URINE RATIO
Creatinine, Urine: 26 mg/dL (ref 20–275)
MICROALB/CREAT RATIO: 8 ug/mg{creat} (ref <30)
Microalb, Ur: 0.2 mg/dL

## 2018-10-25 ENCOUNTER — Ambulatory Visit
Admission: RE | Admit: 2018-10-25 | Discharge: 2018-10-25 | Disposition: A | Discharge status: Home / Self Care | Payer: Medicare Other | Source: Ambulatory Visit | Attending: Physician Assistant | Admitting: Physician Assistant

## 2018-10-25 DIAGNOSIS — R918 Other nonspecific abnormal finding of lung field: Secondary | ICD-10-CM | POA: Diagnosis not present

## 2018-10-25 DIAGNOSIS — C3412 Malignant neoplasm of upper lobe, left bronchus or lung: Secondary | ICD-10-CM

## 2018-11-12 ENCOUNTER — Other Ambulatory Visit: Payer: Self-pay

## 2018-11-22 ENCOUNTER — Ambulatory Visit
Admission: RE | Admit: 2018-11-22 | Discharge: 2018-11-22 | Disposition: A | Discharge status: Home / Self Care | Payer: Medicare Other | Source: Ambulatory Visit | Attending: Internal Medicine | Admitting: Internal Medicine

## 2018-11-22 DIAGNOSIS — Z1231 Encounter for screening mammogram for malignant neoplasm of breast: Secondary | ICD-10-CM

## 2019-01-17 ENCOUNTER — Ambulatory Visit (INDEPENDENT_AMBULATORY_CARE_PROVIDER_SITE_OTHER): Payer: Medicare Other | Admitting: Orthopedic Surgery

## 2019-01-17 ENCOUNTER — Encounter (INDEPENDENT_AMBULATORY_CARE_PROVIDER_SITE_OTHER): Payer: Self-pay | Admitting: Orthopedic Surgery

## 2019-01-17 DIAGNOSIS — M19011 Primary osteoarthritis, right shoulder: Secondary | ICD-10-CM | POA: Diagnosis not present

## 2019-01-17 NOTE — Progress Notes (Signed)
Office Visit Note   Patient: Tiffany [name]           Date of Birth: 1946-02-23           MRN: [id number] Visit Date: 01/17/2019 Requested by: Unk Pinto, [place] [address] [place], [place] [address] PCP: Unk Pinto, MD  Subjective: Chief Complaint  Patient presents with  . Right Shoulder - Pain, Follow-up    CT Review Right Shoulder    HPI: Alyna is a patient with right shoulder rotator cuff arthropathy.  Since have seen her she is had a CT scan.  No real change in symptoms.  She is using a cane in her left hand.  She does weight-bear through both arms in order to get up out of a chair.  She is right-hand dominant.  She is not diabetic.  She is not having falls.  CT scan is reviewed and there does appear to be adequate glenoid bone stock for surgical treatment.  She does have severe end-stage arthritis in that right shoulder.              ROS: All systems reviewed are negative as they relate to the chief complaint within the history of present illness.  Patient denies  fevers or chills.   Assessment & Plan: Visit Diagnoses:  1. Arthritis of right shoulder region     Plan: Impression is right shoulder arthritis in a patient who does do a limited amount of weightbearing through the shoulder when she gets up from a chair.  Plan is right shoulder reverse shoulder replacement.  Risk and benefits are discussed including but limited to infection nerve vessel damage instability potential need for revision as well as implant loosening.  Patient understands risk and benefits and wishes to proceed.  All questions answered  Follow-Up Instructions: No follow-ups on file.   Orders:  No orders of the defined types were placed in this encounter.  No orders of the defined types were placed in this encounter.     Procedures: No procedures performed   Clinical Data: No additional findings.  Objective: Vital Signs: There were no vitals taken for this  visit.  Physical Exam:   Constitutional: Patient appears well-developed HEENT:  Head: Normocephalic Eyes:EOM are normal Neck: Normal range of motion Cardiovascular: Normal rate Pulmonary/chest: Effort normal Neurologic: Patient is alert Skin: Skin is warm Psychiatric: Patient has normal mood and affect    Ortho Exam: Ortho exam demonstrates full active and passive range of motion of the cervical spine.  On the right shoulder she has about 50 degrees of active forward flexion and elevation in that right arm.  Deltoid is functional.  Skin is intact in that right shoulder girdle region in the right shoulder itself is not warm.  Motor sensory function of the hand is intact.  Radial pulses intact.  Elbow range of motion is full.  There is coarse grinding and crepitus with passive range of motion of the shoulder.  She has about 35 degrees of external rotation of 15 degrees of abduction in that shoulder.  Specialty Comments:  No specialty comments available.  Imaging: No results found.   PMFS History: Patient Active Problem List   Diagnosis Date Noted  . Senile purpura ([place]) 10/18/2018  . Unilateral primary osteoarthritis, left knee 07/21/2018  . Peripheral neuropathy ([place]) 08/27/2016  . GERD (gastroesophageal reflux disease) 03/27/2016  . Subclinical hyperthyroidism 08/18/2015  . Gastric ulcer due to nonsteroidal antiinflammatory drug (NSAID) therapy   . Lung  cancer ([place]) 05/09/2015  . Peripheral edema 05/01/2015  . Vitamin D deficiency 07/18/2014  . Medication management 07/18/2014  . Hyperlipidemia 04/04/2014  . Charcot's Right foot 06/15/2013  . Hypertension    Past Medical History:  Diagnosis Date  . Anemia   . Arthritis   . Gait difficulty    problems with balance  . High blood pressure   . History of blood transfusion    05-14-15  . Hyperlipidemia   . Hyperthyroidism mild  . Neuropathy   . Obesity (BMI 35.0-39.9 without comorbidity)   . Pre-diabetes   . Status  post total replacement of left hip 07/13/2015  . Vitamin D deficiency     Family History  Problem Relation Age of Onset  . Heart attack Father   . Hypertension Mother   . Breast cancer Maternal Aunt   . Colon cancer Paternal Grandmother     Past Surgical History:  Procedure Laterality Date  . CERVICAL DISC ARTHROPLASTY    . CRYO INTERCOSTAL NERVE BLOCK Left 05/09/2015   Procedure: CRYO INTERCOSTAL NERVE BLOCK;  Surgeon: [name], MD;  Location: [facility];  Service: Thoracic;  Laterality: Left;  . ESOPHAGOGASTRODUODENOSCOPY N/A 05/16/2015   Procedure: ESOPHAGOGASTRODUODENOSCOPY (EGD);  Surgeon: Irene Shipper, MD;  Location: [facility] ENDOSCOPY;  Service: Endoscopy;  Laterality: N/A;  possible ablation of a bleeding lesion  . knee replaced     right knee x 2  . LOBECTOMY Left 05/09/2015   Procedure: LEFT LUNG UPPER LOBECTOMY;  Surgeon: [name], MD;  Location: [facility];  Service: Thoracic;  Laterality: Left;  . LYMPH NODE DISSECTION Left 05/09/2015   Procedure: LYMPH NODE DISSECTION;  Surgeon: [name], MD;  Location: [facility];  Service: Thoracic;  Laterality: Left;  . ROTATOR CUFF REPAIR Right   . SPINAL FUSION    . TONSILLECTOMY  age 73  . TOTAL HIP ARTHROPLASTY Left 07/13/2015   Procedure: LEFT TOTAL HIP ARTHROPLASTY ANTERIOR APPROACH;  Surgeon: Mcarthur Rossetti, MD;  Location: [facility];  Service: Orthopedics;  Laterality: Left;  Marland Kitchen VAGINAL HYSTERECTOMY    . VIDEO ASSISTED THORACOSCOPY (VATS)/WEDGE RESECTION Left 05/09/2015   Procedure: LEFT VIDEO ASSISTED THORACOSCOPY (VATS) WITH LEFT LUNG UPPER LOBE WEDGE RESECTION;  Surgeon: [name], MD;  Location: [facility];  Service: Thoracic;  Laterality: Left;  Marland Kitchen VIDEO BRONCHOSCOPY WITH ENDOBRONCHIAL NAVIGATION N/A 04/18/2015   Procedure: VIDEO BRONCHOSCOPY WITH ENDOBRONCHIAL NAVIGATION;  Surgeon: Collene Gobble, MD;  Location: [facility];  Service: Thoracic;  Laterality: N/A;  . VIDEO BRONCHOSCOPY WITH ENDOBRONCHIAL  ULTRASOUND N/A 04/18/2015   Procedure: VIDEO BRONCHOSCOPY WITH ENDOBRONCHIAL ULTRASOUND;  Surgeon: Collene Gobble, MD;  Location: [facility];  Service: Thoracic;  Laterality: N/A;   Social History   Occupational History  . Occupation: retired  Tobacco Use  . Smoking status: Former Smoker    Packs/day: 1.00    Years: 30.00    Pack years: 30.00    Types: Cigarettes    Last attempt to quit: 05/08/2015    Years since quitting: 3.6  . Smokeless tobacco: Never Used  Substance and Sexual Activity  . Alcohol use: Yes    Alcohol/week: 1.0 standard drinks    Types: 1 Glasses of wine per week    Comment: 1 glass wine  daily  . Drug use: No  . Sexual activity: Not on file

## 2019-01-18 ENCOUNTER — Other Ambulatory Visit: Payer: Self-pay

## 2019-01-18 MED ORDER — FAMOTIDINE 40 MG PO TABS: 40.0000 mg | ORAL_TABLET | Freq: Every evening | ORAL | 1 refills | Status: DC

## 2019-01-18 NOTE — Telephone Encounter (Signed)
Famotidine refill request

## 2019-01-19 ENCOUNTER — Ambulatory Visit: Admit: 2019-01-19 | Payer: Medicare Other | Admitting: Orthopedic Surgery

## 2019-01-19 SURGERY — ARTHROSCOPY, SHOULDER, WITH GLENOID LABRUM REPAIR
Anesthesia: General | Laterality: Left

## 2019-01-20 ENCOUNTER — Ambulatory Visit: Payer: Self-pay | Admitting: Internal Medicine

## 2019-01-23 ENCOUNTER — Encounter: Payer: Self-pay | Admitting: Internal Medicine

## 2019-01-23 NOTE — Progress Notes (Signed)
This very nice 73 y.o. MWF presents for 6 month follow up with HTN, HLD, Pre-Diabetes and Vitamin D Deficiency. Patient is scheduled for A Rt reverse cup shoulder replacement on Feb 6 by Dr Marlou Sa. Other problem include peripheral Sensory neuropathy & Charcot's Rt ankle for which she's on Gabapentin.  Patient also has GERD controlled on her Meds.      Patient is treated for HTN (1998) & BP has been controlled at home. Today's BP was initially slightly elevated & rechecked at goal - 138/78. Patient has had no complaints of any cardiac type chest pain, palpitations, dyspnea / orthopnea / PND, dizziness, claudication, or dependent edema.     Hyperlipidemia is controlled with diet & meds. Patient denies myalgias or other med SE's. Last Lipids were at goal: Lab Results  Component Value Date   CHOL 196 10/18/2018   HDL 79 10/18/2018   LDLCALC 93 10/18/2018   TRIG 142 10/18/2018   CHOLHDL 2.5 10/18/2018      Also, the patient has Morbid Obesity (BMI 34+) and history of  PreDiabetes / Insulin Resistance (A1c 5.3%  / Insulin 40 / 2012) and has had no symptoms of reactive hypoglycemia, diabetic polys, paresthesias or visual blurring.  Last A1c was Normal & at goal: Lab Results  Component Value Date   HGBA1C 4.8 04/01/2017      Further, the patient also has history of Vitamin D Deficiency("41" / 2009)  and supplements vitamin D without any suspected side-effects. Last vitamin D was at goal:  Lab Results  Component Value Date   VD25OH 94 04/01/2017   Current Outpatient Medications on File Prior to Visit  Medication Sig  . atenolol (TENORMIN) 100 MG tablet TAKE 1 TABLET BY MOUTH DAILY FOR BLOOD PRESSURE (Patient taking differently: Take 50 mg by mouth daily. )  . chlorpheniramine (CHLOR-TRIMETON) 4 MG tablet Take 4 mg by mouth 2 (two) times daily as needed for allergies.  . Cholecalciferol (VITAMIN D3) 125 MCG (5000 UT) CAPS Take 5,000 Units by mouth 2 (two) times daily.  . famotidine (PEPCID) 40  MG tablet Take 1 tablet (40 mg total) by mouth every evening.  . Magnesium 250 MG TABS Take 250 mg by mouth at bedtime.   . Multiple Vitamins-Minerals (MULTIVITAMIN PO) Take 1 tablet by mouth at bedtime.   Marland Kitchen nystatin (NYSTATIN) powder Apply topically 4 (four) times daily. (Patient taking differently: Apply 1 g topically 4 (four) times daily as needed (for infection). )  . HYDROcodone-acetaminophen (NORCO/VICODIN) 5-325 MG tablet Take 1-2 tablets by mouth every 6 (six) hours as needed for moderate pain.   No current facility-administered medications on file prior to visit.    Allergies  Allergen Reactions  . Other Other (See Comments)    Stool softeners caused stomach bloating, nausea, vomiting bleeding ulcer and blood transfusion   PMHx:   Past Medical History:  Diagnosis Date  . Anemia   . Arthritis   . Gait difficulty    problems with balance  . High blood pressure   . History of blood transfusion    05-14-15  . Hyperlipidemia   . Hyperthyroidism mild  . Neuropathy   . Obesity (BMI 35.0-39.9 without comorbidity)   . Pre-diabetes   . 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 Vitamin D deficiency    Immunization History  Administered Date(s) Administered  . Influenza Split 11/22/2013  . Influenza, High Dose Seasonal PF 11/29/2015, 08/27/2016, 09/14/2017, 10/18/2018  . Pneumococcal Conjugate-13  03/27/2016  . Pneumococcal Polysaccharide-23 10/01/1999, 09/14/2017  . Td 10/18/2018  . Tdap 05/29/2008  . Zoster 10/09/2011   Past Surgical History:  Procedure Laterality Date  . CERVICAL DISC ARTHROPLASTY    . CRYO INTERCOSTAL NERVE BLOCK Left 05/09/2015   Procedure: CRYO INTERCOSTAL NERVE BLOCK;  Surgeon: [name], MD;  Location: [facility];  Service: Thoracic;  Laterality: Left;  . ESOPHAGOGASTRODUODENOSCOPY N/A 05/16/2015   Procedure: ESOPHAGOGASTRODUODENOSCOPY (EGD);  Surgeon: Irene Shipper, MD;  Location[facility];  Service: Endoscopy;  Laterality: N/A;   possible ablation of a bleeding lesion  . knee replaced     right knee x 2  . LOBECTOMY Left 05/09/2015   Procedure: LEFT LUNG UPPER LOBECTOMY;  Surgeon: [name], MD;  Location: [facility];  Service: Thoracic;  Laterality: Left;  . LYMPH NODE DISSECTION Left 05/09/2015   Procedure: LYMPH NODE DISSECTION;  Surgeon: [name], MD;  Location: [facility];  Service: Thoracic;  Laterality: Left;  . ROTATOR CUFF REPAIR Right   . SPINAL FUSION    . TONSILLECTOMY  age 67  . TOTAL HIP ARTHROPLASTY Left 07/13/2015   Procedure: LEFT TOTAL HIP ARTHROPLASTY ANTERIOR APPROACH;  Surgeon: Mcarthur Rossetti, MD;  [facility];  Service: Orthopedics;  Laterality: Left;  Marland Kitchen VAGINAL HYSTERECTOMY    . VIDEO ASSISTED THORACOSCOPY (VATS)/WEDGE RESECTION Left 05/09/2015   Procedure: LEFT VIDEO ASSISTED THORACOSCOPY (VATS) WITH LEFT LUNG UPPER LOBE WEDGE RESECTION;  Surgeon: [name], MD;  Location: [facility];  Service: Thoracic;  Laterality: Left;  Marland Kitchen VIDEO BRONCHOSCOPY WITH ENDOBRONCHIAL NAVIGATION N/A 04/18/2015   Procedure: VIDEO BRONCHOSCOPY WITH ENDOBRONCHIAL NAVIGATION;  Surgeon: Collene Gobble, MD;  Location: [facility];  Service: Thoracic;  Laterality: N/A;  . VIDEO BRONCHOSCOPY WITH ENDOBRONCHIAL ULTRASOUND N/A 04/18/2015   Procedure: VIDEO BRONCHOSCOPY WITH ENDOBRONCHIAL ULTRASOUND;  Surgeon: Collene Gobble, MD;  Location: [facility];  Service: Thoracic;  Laterality: N/A;   FHx:    Reviewed / unchanged  SHx:    Reviewed / unchanged   Systems Review:  Constitutional: Denies fever, chills, wt changes, headaches, insomnia, fatigue, night sweats, change in appetite. Eyes: Denies redness, blurred vision, diplopia, discharge, itchy, watery eyes.  ENT: Denies discharge, congestion, post nasal drip, epistaxis, sore throat, earache, hearing loss, dental pain, tinnitus, vertigo, sinus pain, snoring.  CV: Denies chest pain, palpitations, irregular heartbeat, syncope, dyspnea, diaphoresis, orthopnea,  PND, claudication or edema. Respiratory: denies cough, dyspnea, DOE, pleurisy, hoarseness, laryngitis, wheezing.  Gastrointestinal: Denies dysphagia, odynophagia, heartburn, reflux, water brash, abdominal pain or cramps, nausea, vomiting, bloating, diarrhea, constipation, hematemesis, melena, hematochezia  or hemorrhoids. Genitourinary: Denies dysuria, frequency, urgency, nocturia, hesitancy, discharge, hematuria or flank pain. Musculoskeletal: Denies arthralgias, myalgias, stiffness, jt. swelling, pain, limping or strain/sprain.  Skin: Denies pruritus, rash, hives, warts, acne, eczema or change in skin lesion(s). Neuro: No weakness, tremor, incoordination, spasms, paresthesia or pain. Psychiatric: Denies confusion, memory loss or sensory loss. Endo: Denies change in weight, skin or hair change.  Heme/Lymph: No excessive bleeding, bruising or enlarged lymph nodes.  Physical Exam  BP 138/78   Pulse 72   Temp (!) 97.5 F (36.4 C)   Ht 5\' 6"  (1.676 m)   Wt 220 lb (99.8 kg)   SpO2 97%   BMI 35.51 kg/m   Appears  well nourished, well groomed  and in no distress.  Eyes: PERRLA, EOMs, conjunctiva no swelling or erythema. Sinuses: No frontal/maxillary tenderness ENT/Mouth: EAC's clear, TM's nl w/o erythema, bulging. Nares clear w/o  erythema, swelling, exudates. Oropharynx clear without erythema or exudates. Oral hygiene is good. Tongue normal, non obstructing. Hearing intact.  Neck: Supple. Thyroid not palpable. Car 2+/2+ without bruits, nodes or JVD. Chest: Respirations nl with BS clear & equal w/o rales, rhonchi, wheezing or stridor.  Cor: Heart sounds normal w/ regular rate and rhythm without sig. murmurs, gallops, clicks or rubs. Peripheral pulses normal and equal  without edema.  Abdomen: Soft & bowel sounds normal. Non-tender w/o guarding, rebound, hernias, masses or organomegaly.  Lymphatics: Unremarkable.  Musculoskeletal: Full ROM all peripheral extremities, joint stability, 5/5  strength and normal gait.  Skin: Warm, dry without exposed rashes, lesions or ecchymosis apparent.  Neuro: Cranial nerves intact, reflexes equal bilaterally. Sensory-motor testing grossly intact. Tendon reflexes grossly intact.  Pysch: Alert & oriented x 3.  Insight and judgement nl & appropriate. No ideations.  Assessment and Plan:  1. Essential hypertension  - Continue medication, monitor blood pressure at home.  - Continue DASH diet.  Reminder to go to the ER if any CP,  SOB, nausea, dizziness, severe HA, changes vision/speech.  - CBC with Differential/Platelet - COMPLETE METABOLIC PANEL WITH GFR - Magnesium - TSH  2. Hyperlipidemia, mixed  - Continue diet/meds, exercise,& lifestyle modifications.  - Continue monitor periodic cholesterol/liver & renal functions   - Lipid panel - TSH  3. Abnormal glucose  - Hemoglobin A1c - Insulin, random  4. Vitamin D deficiency  - Continue diet, exercise  - Lifestyle modifications.  - Monitor appropriate labs. - Continue supplementation.  - VITAMIN D 25 Hydroxyl  5. Subclinical hyperthyroidism  - Hemoglobin A1c - Insulin, random  6. Sensory neuropathy   7. Medication management  - CBC with Differential/Platelet - COMPLETE METABOLIC PANEL WITH GFR - Magnesium - Lipid panel - TSH - Hemoglobin A1c - Insulin, random - VITAMIN D 25 Hydroxyl       Discussed  regular exercise, BP monitoring, weight control to achieve/maintain BMI less than 25 and discussed med and SE's. Recommended labs to assess and monitor clinical status with further disposition pending results of labs. Over 30 minutes of exam, counseling, chart review was performed.

## 2019-01-23 NOTE — Patient Instructions (Signed)

## 2019-01-24 ENCOUNTER — Ambulatory Visit (INDEPENDENT_AMBULATORY_CARE_PROVIDER_SITE_OTHER): Payer: Medicare Other | Admitting: Internal Medicine

## 2019-01-24 ENCOUNTER — Other Ambulatory Visit (INDEPENDENT_AMBULATORY_CARE_PROVIDER_SITE_OTHER): Payer: Self-pay | Admitting: Orthopedic Surgery

## 2019-01-24 ENCOUNTER — Encounter: Payer: Self-pay | Admitting: Internal Medicine

## 2019-01-24 VITALS — BP 138/78 | HR 72 | Temp 97.5°F | Ht 66.0 in | Wt 220.0 lb

## 2019-01-24 DIAGNOSIS — M12811 Other specific arthropathies, not elsewhere classified, right shoulder: Secondary | ICD-10-CM

## 2019-01-24 DIAGNOSIS — G8929 Other chronic pain: Secondary | ICD-10-CM | POA: Insufficient documentation

## 2019-01-24 DIAGNOSIS — G894 Chronic pain syndrome: Secondary | ICD-10-CM

## 2019-01-24 DIAGNOSIS — Z79899 Other long term (current) drug therapy: Secondary | ICD-10-CM

## 2019-01-24 DIAGNOSIS — G629 Polyneuropathy, unspecified: Secondary | ICD-10-CM

## 2019-01-24 DIAGNOSIS — R7309 Other abnormal glucose: Secondary | ICD-10-CM

## 2019-01-24 DIAGNOSIS — E559 Vitamin D deficiency, unspecified: Secondary | ICD-10-CM | POA: Diagnosis not present

## 2019-01-24 DIAGNOSIS — E0590 Thyrotoxicosis, unspecified without thyrotoxic crisis or storm: Secondary | ICD-10-CM | POA: Diagnosis not present

## 2019-01-24 DIAGNOSIS — I10 Essential (primary) hypertension: Secondary | ICD-10-CM

## 2019-01-24 DIAGNOSIS — E782 Mixed hyperlipidemia: Secondary | ICD-10-CM | POA: Diagnosis not present

## 2019-01-24 DIAGNOSIS — R7303 Prediabetes: Secondary | ICD-10-CM | POA: Diagnosis not present

## 2019-01-24 MED ORDER — FLUTICASONE PROPIONATE 50 MCG/ACT NA SUSP: NASAL | 3 refills | Status: DC

## 2019-01-24 MED ORDER — MELOXICAM 15 MG PO TABS: ORAL_TABLET | ORAL | 1 refills | Status: DC

## 2019-01-24 MED ORDER — ENALAPRIL MALEATE 20 MG PO TABS: ORAL_TABLET | ORAL | 1 refills | Status: DC

## 2019-01-24 MED ORDER — FUROSEMIDE 40 MG PO TABS: ORAL_TABLET | ORAL | 0 refills | Status: DC

## 2019-01-24 MED ORDER — GABAPENTIN 600 MG PO TABS: ORAL_TABLET | ORAL | 3 refills | Status: DC

## 2019-01-24 MED ORDER — PRAVASTATIN SODIUM 40 MG PO TABS: ORAL_TABLET | ORAL | 0 refills | Status: DC

## 2019-01-25 ENCOUNTER — Other Ambulatory Visit: Payer: Self-pay | Admitting: Internal Medicine

## 2019-01-25 DIAGNOSIS — N189 Chronic kidney disease, unspecified: Secondary | ICD-10-CM

## 2019-01-25 DIAGNOSIS — E782 Mixed hyperlipidemia: Secondary | ICD-10-CM

## 2019-01-25 DIAGNOSIS — N179 Acute kidney failure, unspecified: Secondary | ICD-10-CM

## 2019-01-25 LAB — INSULIN, RANDOM: INSULIN: 31.7 u[IU]/mL — AB (ref 2.0–19.6)

## 2019-01-25 LAB — MAGNESIUM: MAGNESIUM: 2 mg/dL (ref 1.5–2.5)

## 2019-01-25 LAB — TSH: TSH: 0.1 mIU/L — ABNORMAL LOW (ref 0.40–4.50)

## 2019-01-25 LAB — CBC WITH DIFFERENTIAL/PLATELET
Absolute Monocytes: 695 cells/uL (ref 200–950)
BASOS ABS: 70 {cells}/uL (ref 0–200)
BASOS PCT: 0.8 %
EOS PCT: 1 %
Eosinophils Absolute: 88 cells/uL (ref 15–500)
HCT: 40.1 % (ref 35.0–45.0)
HEMOGLOBIN: 13.5 g/dL (ref 11.7–15.5)
Lymphs Abs: 2376 cells/uL (ref 850–3900)
MCH: 31.3 pg (ref 27.0–33.0)
MCHC: 33.7 g/dL (ref 32.0–36.0)
MCV: 93 fL (ref 80.0–100.0)
MONOS PCT: 7.9 %
MPV: 10.1 fL (ref 7.5–12.5)
NEUTROS ABS: 5570 {cells}/uL (ref 1500–7800)
Neutrophils Relative %: 63.3 %
Platelets: 339 10*3/uL (ref 140–400)
RBC: 4.31 10*6/uL (ref 3.80–5.10)
RDW: 13.1 % (ref 11.0–15.0)
Total Lymphocyte: 27 %
WBC: 8.8 10*3/uL (ref 3.8–10.8)

## 2019-01-25 LAB — COMPLETE METABOLIC PANEL WITH GFR
AG Ratio: 1.3 (calc) (ref 1.0–2.5)
ALBUMIN MSPROF: 4.1 g/dL (ref 3.6–5.1)
ALKALINE PHOSPHATASE (APISO): 115 U/L (ref 33–130)
ALT: 27 U/L (ref 6–29)
AST: 29 U/L (ref 10–35)
BUN / CREAT RATIO: 16 (calc) (ref 6–22)
BUN: 18 mg/dL (ref 7–25)
CALCIUM: 9.7 mg/dL (ref 8.6–10.4)
CO2: 27 mmol/L (ref 20–32)
CREATININE: 1.15 mg/dL — AB (ref 0.60–0.93)
Chloride: 99 mmol/L (ref 98–110)
GFR, Est African American: 56 mL/min/{1.73_m2} — ABNORMAL LOW (ref >=60)
GFR, Est Non African American: 48 mL/min/{1.73_m2} — ABNORMAL LOW (ref >=60)
GLUCOSE: 94 mg/dL (ref 65–99)
Globulin: 3.2 g/dL (calc) (ref 1.9–3.7)
Potassium: 4.5 mmol/L (ref 3.5–5.3)
Sodium: 137 mmol/L (ref 135–146)
Total Bilirubin: 0.6 mg/dL (ref 0.2–1.2)
Total Protein: 7.3 g/dL (ref 6.1–8.1)

## 2019-01-25 LAB — LIPID PANEL
Cholesterol: 209 mg/dL — ABNORMAL HIGH (ref <200)
HDL: 80 mg/dL (ref >50)
LDL CHOLESTEROL (CALC): 101 mg/dL — AB
Non-HDL Cholesterol (Calc): 129 mg/dL (calc) (ref <130)
TRIGLYCERIDES: 190 mg/dL — AB (ref <150)
Total CHOL/HDL Ratio: 2.6 (calc) (ref <5.0)

## 2019-01-25 LAB — VITAMIN D 25 HYDROXY (VIT D DEFICIENCY, FRACTURES): VIT D 25 HYDROXY: 100 ng/mL (ref 30–100)

## 2019-01-25 LAB — HEMOGLOBIN A1C
Hgb A1c MFr Bld: 5.2 % of total Hgb (ref <5.7)
Mean Plasma Glucose: 103 (calc)
eAG (mmol/L): 5.7 (calc)

## 2019-01-25 MED ORDER — ROSUVASTATIN CALCIUM 20 MG PO TABS: ORAL_TABLET | ORAL | 1 refills | Status: DC

## 2019-01-26 ENCOUNTER — Encounter (HOSPITAL_COMMUNITY): Payer: Self-pay

## 2019-01-26 ENCOUNTER — Encounter (HOSPITAL_COMMUNITY)
Admission: RE | Admit: 2019-01-26 | Discharge: 2019-01-26 | Disposition: A | Discharge status: Home / Self Care | Payer: Medicare Other | Source: Ambulatory Visit | Attending: Orthopedic Surgery | Admitting: Orthopedic Surgery

## 2019-01-26 ENCOUNTER — Other Ambulatory Visit: Payer: Self-pay

## 2019-01-26 DIAGNOSIS — Z01818 Encounter for other preprocedural examination: Secondary | ICD-10-CM | POA: Diagnosis not present

## 2019-01-26 HISTORY — DX: Malignant (primary) neoplasm, unspecified: C80.1

## 2019-01-26 LAB — URINALYSIS, ROUTINE W REFLEX MICROSCOPIC
Bilirubin Urine: NEGATIVE
GLUCOSE, UA: NEGATIVE mg/dL
HGB URINE DIPSTICK: NEGATIVE
Ketones, ur: NEGATIVE mg/dL
Nitrite: NEGATIVE
PH: 6 (ref 5.0–8.0)
Protein, ur: NEGATIVE mg/dL
SPECIFIC GRAVITY, URINE: 1.017 (ref 1.005–1.030)

## 2019-01-26 LAB — BASIC METABOLIC PANEL
ANION GAP: 12 (ref 5–15)
BUN: 15 mg/dL (ref 8–23)
CALCIUM: 9.1 mg/dL (ref 8.9–10.3)
CO2: 26 mmol/L (ref 22–32)
CREATININE: 1.16 mg/dL — AB (ref 0.44–1.00)
Chloride: 99 mmol/L (ref 98–111)
GFR, EST AFRICAN AMERICAN: 55 mL/min — AB (ref >60)
GFR, EST NON AFRICAN AMERICAN: 48 mL/min — AB (ref >60)
GLUCOSE: 102 mg/dL — AB (ref 70–99)
Potassium: 4.1 mmol/L (ref 3.5–5.1)
Sodium: 137 mmol/L (ref 135–145)

## 2019-01-26 LAB — SURGICAL PCR SCREEN
MRSA, PCR: NEGATIVE
Staphylococcus aureus: NEGATIVE

## 2019-01-26 LAB — CBC
HCT: 42.9 % (ref 36.0–46.0)
Hemoglobin: 13.3 g/dL (ref 12.0–15.0)
MCH: 30.5 pg (ref 26.0–34.0)
MCHC: 31 g/dL (ref 30.0–36.0)
MCV: 98.4 fL (ref 80.0–100.0)
PLATELETS: 293 10*3/uL (ref 150–400)
RBC: 4.36 MIL/uL (ref 3.87–5.11)
RDW: 14.1 % (ref 11.5–15.5)
WBC: 9.9 10*3/uL (ref 4.0–10.5)
nRBC: 0 % (ref 0.0–0.2)

## 2019-01-26 NOTE — Progress Notes (Signed)
PCP - Unk Pinto Cardiologist - denies  Chest x-ray - N/A EKG - 01-26-18  DM: denies SA: denies  Patient denies shortness of breath, fever, cough and chest pain at PAT appointment   Patient verbalized understanding of instructions that were given to them at the PAT appointment. Patient was also instructed that they will need to review over the PAT instructions again at home before surgery.

## 2019-01-26 NOTE — Progress Notes (Signed)
UA results sent to surgeon.

## 2019-01-26 NOTE — Progress Notes (Addendum)
Patient was unable to give a adequate amount of urine for culture.  A sterile cup was given to her & family member, who will bring specimen back the same day she voids. (they understand and agree)  Thursday - urine sample brought in today for culture.  I am unable to add it to the orders at this time.  Will reach out to surgeon's office to see if they can order culture again.

## 2019-01-26 NOTE — Pre-Procedure Instructions (Signed)
Tiffany [name]  01/26/2019      [place], [place] [facility] [address] [place] [address] Phone: [phone] Fax: [phone]    Your procedure is scheduled on February 6th.  Report to [facility] Admitting at 5:30 A.M.  Call this number if you have problems the morning of surgery:  [phone]   Remember:  Do not eat or drink after midnight.     Take these medicines the morning of surgery with A SIP OF WATER   Atenolol  Flonase - if needed  Gabapentin (Neurtontin)  Hydrocodone - if needed   7 days prior to surgery STOP taking any Aspirin (unless otherwise instructed by your surgeon), Aleve, Naproxen, Ibuprofen, Motrin, Advil, Goody's, BC's, all herbal medications, fish oil, and all vitamins.     Do not wear jewelry, make-up or nail polish.  Do not wear lotions, powders, or perfumes, or deodorant.  Do not shave 48 hours prior to surgery.    Do not bring valuables to the hospital.  [facility] is not responsible for any belongings or valuables.   [facility]- Preparing For Surgery  Before surgery, you can play an important role. Because skin is not sterile, your skin needs to be as free of germs as possible. You can reduce the number of germs on your skin by washing with CHG (chlorahexidine gluconate) Soap before surgery.  CHG is an antiseptic cleaner which kills germs and bonds with the skin to continue killing germs even after washing.    Oral Hygiene is also important to reduce your risk of infection.  Remember - BRUSH YOUR TEETH THE MORNING OF SURGERY WITH YOUR REGULAR TOOTHPASTE  Please do not use if you have an allergy to CHG or antibacterial soaps. If your skin becomes reddened/irritated stop using the CHG.  Do not shave (including legs and underarms) for at least 48 hours prior to first CHG shower. It is OK to shave your face.  Please follow these instructions carefully.   1. Shower the NIGHT BEFORE  SURGERY and the MORNING OF SURGERY with CHG.   2. If you chose to wash your hair, wash your hair first as usual with your normal shampoo.  3. After you shampoo, rinse your hair and body thoroughly to remove the shampoo.  4. Use CHG as you would any other liquid soap. You can apply CHG directly to the skin and wash gently with a scrungie or a clean washcloth.   5. Apply the CHG Soap to your body ONLY FROM THE NECK DOWN.  Do not use on open wounds or open sores. Avoid contact with your eyes, ears, mouth and genitals (private parts). Wash Face and genitals (private parts)  with your normal soap.  6. Wash thoroughly, paying special attention to the area where your surgery will be performed.  7. Thoroughly rinse your body with warm water from the neck down.  8. DO NOT shower/wash with your normal soap after using and rinsing off the CHG Soap.  9. Pat yourself dry with a CLEAN TOWEL.  10. Wear CLEAN PAJAMAS to bed the night before surgery, wear comfortable clothes the morning of surgery  11. Place CLEAN SHEETS on your bed the night of your first shower and DO NOT SLEEP WITH PETS.   Day of Surgery:  Do not apply any deodorants/lotions.  Please wear clean clothes to the hospital/surgery center.   Remember to brush your teeth WITH YOUR REGULAR  TOOTHPASTE.   Contacts, dentures or bridgework may not be worn into surgery.  Leave your suitcase in the car.  After surgery it may be brought to your room.  For patients admitted to the hospital, discharge time will be determined by your treatment team.  Patients discharged the day of surgery will not be allowed to drive home.   Please read over the following fact sheets that you were given. Coughing and Deep Breathing, MRSA Information and Surgical Site Infection Prevention

## 2019-01-27 ENCOUNTER — Other Ambulatory Visit (HOSPITAL_COMMUNITY): Payer: Self-pay | Admitting: *Deleted

## 2019-01-27 DIAGNOSIS — Z01818 Encounter for other preprocedural examination: Secondary | ICD-10-CM | POA: Diagnosis not present

## 2019-01-27 LAB — URINALYSIS, ROUTINE W REFLEX MICROSCOPIC
Bilirubin Urine: NEGATIVE
Glucose, UA: NEGATIVE mg/dL
Hgb urine dipstick: NEGATIVE
Ketones, ur: NEGATIVE mg/dL
Leukocytes, UA: NEGATIVE
NITRITE: NEGATIVE
Protein, ur: NEGATIVE mg/dL
Specific Gravity, Urine: 1.009 (ref 1.005–1.030)
pH: 6 (ref 5.0–8.0)

## 2019-01-27 NOTE — Progress Notes (Signed)
Urine culture order has been accepted and sent

## 2019-01-28 LAB — URINE CULTURE

## 2019-01-31 ENCOUNTER — Telehealth (INDEPENDENT_AMBULATORY_CARE_PROVIDER_SITE_OTHER): Payer: Self-pay | Admitting: Orthopedic Surgery

## 2019-01-31 NOTE — Telephone Encounter (Signed)
Pt is scheduled for right reverse shoulder arthroplasty on 02/03/2019 and has cold symptoms. Please advise.

## 2019-01-31 NOTE — Telephone Encounter (Signed)
Pt called in with concerns about her upcoming surgery due to having symptoms of a possible cold.

## 2019-02-01 NOTE — Telephone Encounter (Signed)
I called she will call wed no fever or chest sxs

## 2019-02-01 NOTE — Telephone Encounter (Signed)
noted 

## 2019-02-02 ENCOUNTER — Telehealth (INDEPENDENT_AMBULATORY_CARE_PROVIDER_SITE_OTHER): Payer: Self-pay | Admitting: Orthopedic Surgery

## 2019-02-02 NOTE — Telephone Encounter (Signed)
Please see below. Would you like to reschedule surgery?

## 2019-02-02 NOTE — Telephone Encounter (Signed)
Please see below.  Patient's surgery will need to be rescheduled once she is well.  Dr. Marlou Sa would prefer to move other case up and then open up for clinic in the afternoon.

## 2019-02-02 NOTE — Telephone Encounter (Signed)
Pt called in said Dr.Dean told her to call in to update him on her cold since she has surgery this Thursday, but the pt says her cold has gotten worse she feels completely weak and doesn't have a cold.  [phone]

## 2019-02-02 NOTE — Telephone Encounter (Signed)
Y and lets ope for either am or pm - maybe do surgery am then patients pm

## 2019-02-03 ENCOUNTER — Inpatient Hospital Stay (HOSPITAL_COMMUNITY): Admission: RE | Admit: 2019-02-03 | Payer: Medicare Other | Source: Home / Self Care | Admitting: Orthopedic Surgery

## 2019-02-03 SURGERY — ARTHROPLASTY, SHOULDER, TOTAL
Anesthesia: General | Laterality: Right

## 2019-02-09 ENCOUNTER — Other Ambulatory Visit: Payer: Self-pay

## 2019-02-09 ENCOUNTER — Encounter (HOSPITAL_COMMUNITY): Payer: Self-pay | Admitting: *Deleted

## 2019-02-09 NOTE — Progress Notes (Signed)
Pt denies SOB, chest pain, and being under the care of a cardiologist. Pt denies having a stress test, echo and cardiac cath. Pt denies having a chest x ray within the last year. Pt made aware to stop taking vitamins, fish oil and herbal medications. Do not take any NSAIDs ie: Ibuprofen, Advil, Naproxen (Aleve), Motrin, BC and Goody Powder or any medication containing Aspirin. Pt verbalized understanding of all pre-op instructions.

## 2019-02-10 ENCOUNTER — Inpatient Hospital Stay (HOSPITAL_COMMUNITY)
Admission: RE | Admit: 2019-02-10 | Discharge: 2019-02-11 | DRG: 483 | Disposition: A | Discharge status: Home / Self Care | Payer: Medicare Other | Attending: Orthopedic Surgery | Admitting: Orthopedic Surgery

## 2019-02-10 ENCOUNTER — Other Ambulatory Visit: Payer: Self-pay

## 2019-02-10 ENCOUNTER — Inpatient Hospital Stay (HOSPITAL_COMMUNITY): Payer: Medicare Other

## 2019-02-10 ENCOUNTER — Encounter (HOSPITAL_COMMUNITY): Payer: Self-pay

## 2019-02-10 ENCOUNTER — Inpatient Hospital Stay (HOSPITAL_COMMUNITY): Payer: Medicare Other | Admitting: Anesthesiology

## 2019-02-10 ENCOUNTER — Encounter (HOSPITAL_COMMUNITY)
Admission: RE | Disposition: A | Discharge status: Home / Self Care | Payer: Self-pay | Source: Home / Self Care | Attending: Orthopedic Surgery

## 2019-02-10 DIAGNOSIS — M12811 Other specific arthropathies, not elsewhere classified, right shoulder: Secondary | ICD-10-CM

## 2019-02-10 DIAGNOSIS — M19011 Primary osteoarthritis, right shoulder: Principal | ICD-10-CM | POA: Diagnosis present

## 2019-02-10 DIAGNOSIS — Z96642 Presence of left artificial hip joint: Secondary | ICD-10-CM | POA: Diagnosis present

## 2019-02-10 DIAGNOSIS — G8918 Other acute postprocedural pain: Secondary | ICD-10-CM | POA: Diagnosis not present

## 2019-02-10 DIAGNOSIS — Z981 Arthrodesis status: Secondary | ICD-10-CM | POA: Diagnosis not present

## 2019-02-10 DIAGNOSIS — I10 Essential (primary) hypertension: Secondary | ICD-10-CM | POA: Diagnosis present

## 2019-02-10 DIAGNOSIS — R7303 Prediabetes: Secondary | ICD-10-CM | POA: Diagnosis present

## 2019-02-10 DIAGNOSIS — Z800 Family history of malignant neoplasm of digestive organs: Secondary | ICD-10-CM | POA: Diagnosis not present

## 2019-02-10 DIAGNOSIS — Z87891 Personal history of nicotine dependence: Secondary | ICD-10-CM | POA: Diagnosis not present

## 2019-02-10 DIAGNOSIS — K219 Gastro-esophageal reflux disease without esophagitis: Secondary | ICD-10-CM | POA: Diagnosis not present

## 2019-02-10 DIAGNOSIS — Z85118 Personal history of other malignant neoplasm of bronchus and lung: Secondary | ICD-10-CM

## 2019-02-10 DIAGNOSIS — Z90710 Acquired absence of both cervix and uterus: Secondary | ICD-10-CM

## 2019-02-10 DIAGNOSIS — Z888 Allergy status to other drugs, medicaments and biological substances status: Secondary | ICD-10-CM

## 2019-02-10 DIAGNOSIS — Z8249 Family history of ischemic heart disease and other diseases of the circulatory system: Secondary | ICD-10-CM | POA: Diagnosis not present

## 2019-02-10 DIAGNOSIS — Z8711 Personal history of peptic ulcer disease: Secondary | ICD-10-CM | POA: Diagnosis not present

## 2019-02-10 DIAGNOSIS — Z6832 Body mass index (BMI) 32.0-32.9, adult: Secondary | ICD-10-CM

## 2019-02-10 DIAGNOSIS — Z96611 Presence of right artificial shoulder joint: Secondary | ICD-10-CM | POA: Diagnosis not present

## 2019-02-10 DIAGNOSIS — E669 Obesity, unspecified: Secondary | ICD-10-CM | POA: Diagnosis not present

## 2019-02-10 DIAGNOSIS — Z471 Aftercare following joint replacement surgery: Secondary | ICD-10-CM | POA: Diagnosis not present

## 2019-02-10 DIAGNOSIS — M19019 Primary osteoarthritis, unspecified shoulder: Secondary | ICD-10-CM | POA: Diagnosis present

## 2019-02-10 DIAGNOSIS — Z803 Family history of malignant neoplasm of breast: Secondary | ICD-10-CM

## 2019-02-10 DIAGNOSIS — K0889 Other specified disorders of teeth and supporting structures: Secondary | ICD-10-CM | POA: Diagnosis not present

## 2019-02-10 DIAGNOSIS — Z79899 Other long term (current) drug therapy: Secondary | ICD-10-CM

## 2019-02-10 DIAGNOSIS — E782 Mixed hyperlipidemia: Secondary | ICD-10-CM | POA: Diagnosis not present

## 2019-02-10 HISTORY — PX: REVERSE SHOULDER ARTHROPLASTY: SHX5054

## 2019-02-10 HISTORY — DX: Presence of spectacles and contact lenses: Z97.3

## 2019-02-10 HISTORY — DX: Presence of dental prosthetic device (complete) (partial): Z97.2

## 2019-02-10 SURGERY — ARTHROPLASTY, SHOULDER, TOTAL, REVERSE
Anesthesia: General | Site: Shoulder | Laterality: Right

## 2019-02-10 MED ORDER — ONDANSETRON HCL 4 MG PO TABS: 4.0000 mg | ORAL_TABLET | Freq: Four times a day (QID) | ORAL | Status: DC | PRN

## 2019-02-10 MED ORDER — CHLORHEXIDINE GLUCONATE 4 % EX LIQD: 60.0000 mL | Freq: Once | CUTANEOUS | Status: DC

## 2019-02-10 MED ORDER — LIDOCAINE 2% (20 MG/ML) 5 ML SYRINGE
INTRAMUSCULAR | Status: AC
  Filled 2019-02-10: qty 5

## 2019-02-10 MED ORDER — ONDANSETRON HCL 4 MG/2ML IJ SOLN: 4.0000 mg | Freq: Once | INTRAMUSCULAR | Status: DC | PRN

## 2019-02-10 MED ORDER — HYDROMORPHONE HCL 1 MG/ML IJ SOLN: 0.2500 mg | INTRAMUSCULAR | Status: DC | PRN

## 2019-02-10 MED ORDER — ASPIRIN EC 81 MG PO TBEC
81.0000 mg | DELAYED_RELEASE_TABLET | Freq: Every day | ORAL | Status: DC
  Administered 2019-02-10 – 2019-02-11 (×2): 81 mg via ORAL
  Filled 2019-02-10 (×2): qty 1

## 2019-02-10 MED ORDER — FENTANYL CITRATE (PF) 100 MCG/2ML IJ SOLN
INTRAMUSCULAR | Status: AC
  Administered 2019-02-10: 100 ug via INTRAVENOUS
  Filled 2019-02-10: qty 2

## 2019-02-10 MED ORDER — CEFAZOLIN SODIUM-DEXTROSE 2-4 GM/100ML-% IV SOLN
INTRAVENOUS | Status: AC
  Filled 2019-02-10: qty 100

## 2019-02-10 MED ORDER — METHOCARBAMOL 1000 MG/10ML IJ SOLN
500.0000 mg | Freq: Four times a day (QID) | INTRAVENOUS | Status: DC | PRN
  Filled 2019-02-10: qty 5

## 2019-02-10 MED ORDER — VANCOMYCIN HCL 1000 MG IV SOLR
INTRAVENOUS | Status: AC
  Filled 2019-02-10: qty 1000

## 2019-02-10 MED ORDER — MEPERIDINE HCL 50 MG/ML IJ SOLN: 6.2500 mg | INTRAMUSCULAR | Status: DC | PRN

## 2019-02-10 MED ORDER — PHENYLEPHRINE 40 MCG/ML (10ML) SYRINGE FOR IV PUSH (FOR BLOOD PRESSURE SUPPORT)
PREFILLED_SYRINGE | INTRAVENOUS | Status: AC
  Filled 2019-02-10: qty 10

## 2019-02-10 MED ORDER — KETOROLAC TROMETHAMINE 30 MG/ML IJ SOLN
INTRAMUSCULAR | Status: AC
  Administered 2019-02-10: 30 mg via INTRAVENOUS
  Filled 2019-02-10: qty 1

## 2019-02-10 MED ORDER — OXYCODONE HCL 5 MG PO TABS
ORAL_TABLET | ORAL | Status: AC
  Filled 2019-02-10: qty 2

## 2019-02-10 MED ORDER — LACTATED RINGERS IV SOLN
INTRAVENOUS | Status: DC
  Administered 2019-02-10 (×3): via INTRAVENOUS

## 2019-02-10 MED ORDER — LIDOCAINE HCL (CARDIAC) PF 100 MG/5ML IV SOSY
PREFILLED_SYRINGE | INTRAVENOUS | Status: DC | PRN
  Administered 2019-02-10: 100 mg via INTRAVENOUS

## 2019-02-10 MED ORDER — ROSUVASTATIN CALCIUM 20 MG PO TABS
20.0000 mg | ORAL_TABLET | Freq: Every day | ORAL | Status: DC
  Administered 2019-02-10: 20 mg via ORAL
  Filled 2019-02-10 (×2): qty 1

## 2019-02-10 MED ORDER — ATENOLOL 50 MG PO TABS
50.0000 mg | ORAL_TABLET | Freq: Every day | ORAL | Status: DC
  Administered 2019-02-11: 50 mg via ORAL
  Filled 2019-02-10: qty 1

## 2019-02-10 MED ORDER — ADULT MULTIVITAMIN LIQUID CH
Freq: Every day | ORAL | Status: DC
  Administered 2019-02-10: 15 mL via ORAL
  Filled 2019-02-10: qty 15

## 2019-02-10 MED ORDER — FAMOTIDINE 20 MG PO TABS
40.0000 mg | ORAL_TABLET | Freq: Every evening | ORAL | Status: DC
  Administered 2019-02-10: 40 mg via ORAL
  Filled 2019-02-10: qty 2

## 2019-02-10 MED ORDER — FENTANYL CITRATE (PF) 100 MCG/2ML IJ SOLN
100.0000 ug | Freq: Once | INTRAMUSCULAR | Status: AC
  Administered 2019-02-10: 100 ug via INTRAVENOUS

## 2019-02-10 MED ORDER — ONDANSETRON HCL 4 MG/2ML IJ SOLN: 4.0000 mg | Freq: Four times a day (QID) | INTRAMUSCULAR | Status: DC | PRN

## 2019-02-10 MED ORDER — 0.9 % SODIUM CHLORIDE (POUR BTL) OPTIME
TOPICAL | Status: DC | PRN
  Administered 2019-02-10 (×6): 1000 mL

## 2019-02-10 MED ORDER — VANCOMYCIN HCL 1000 MG IV SOLR
INTRAVENOUS | Status: DC | PRN
  Administered 2019-02-10: 1000 mg

## 2019-02-10 MED ORDER — HYDROMORPHONE HCL 1 MG/ML IJ SOLN: 0.5000 mg | INTRAMUSCULAR | Status: DC | PRN

## 2019-02-10 MED ORDER — MENTHOL 3 MG MT LOZG: 1.0000 | LOZENGE | OROMUCOSAL | Status: DC | PRN

## 2019-02-10 MED ORDER — FUROSEMIDE 40 MG PO TABS
40.0000 mg | ORAL_TABLET | Freq: Every day | ORAL | Status: DC
  Filled 2019-02-10: qty 1

## 2019-02-10 MED ORDER — MAGNESIUM OXIDE 400 (241.3 MG) MG PO TABS
200.0000 mg | ORAL_TABLET | Freq: Every day | ORAL | Status: DC
  Administered 2019-02-10: 200 mg via ORAL
  Filled 2019-02-10: qty 1

## 2019-02-10 MED ORDER — SODIUM CHLORIDE 0.9 % IV SOLN
INTRAVENOUS | Status: DC | PRN
  Administered 2019-02-10: 40 ug/min via INTRAVENOUS

## 2019-02-10 MED ORDER — MELOXICAM 7.5 MG PO TABS
7.5000 mg | ORAL_TABLET | Freq: Every day | ORAL | Status: DC
  Administered 2019-02-11: 7.5 mg via ORAL
  Filled 2019-02-10: qty 1

## 2019-02-10 MED ORDER — EPHEDRINE 5 MG/ML INJ
INTRAVENOUS | Status: AC
  Filled 2019-02-10: qty 10

## 2019-02-10 MED ORDER — SUGAMMADEX SODIUM 200 MG/2ML IV SOLN
INTRAVENOUS | Status: DC | PRN
  Administered 2019-02-10: 200 mg via INTRAVENOUS

## 2019-02-10 MED ORDER — VITAMIN D3 25 MCG (1000 UNIT) PO TABS
5000.0000 [IU] | ORAL_TABLET | Freq: Two times a day (BID) | ORAL | Status: DC
  Administered 2019-02-10 – 2019-02-11 (×2): 5000 [IU] via ORAL
  Filled 2019-02-10 (×3): qty 5

## 2019-02-10 MED ORDER — CEFAZOLIN SODIUM-DEXTROSE 2-4 GM/100ML-% IV SOLN
2.0000 g | INTRAVENOUS | Status: AC
  Administered 2019-02-10: 2 g via INTRAVENOUS

## 2019-02-10 MED ORDER — MIDAZOLAM HCL 2 MG/2ML IJ SOLN
INTRAMUSCULAR | Status: AC
  Administered 2019-02-10: 2 mg via INTRAVENOUS
  Filled 2019-02-10: qty 2

## 2019-02-10 MED ORDER — ONDANSETRON HCL 4 MG/2ML IJ SOLN
INTRAMUSCULAR | Status: DC | PRN
  Administered 2019-02-10: 4 mg via INTRAVENOUS

## 2019-02-10 MED ORDER — KETOROLAC TROMETHAMINE 30 MG/ML IJ SOLN
30.0000 mg | Freq: Once | INTRAMUSCULAR | Status: AC | PRN
  Administered 2019-02-10: 30 mg via INTRAVENOUS

## 2019-02-10 MED ORDER — ENALAPRIL MALEATE 5 MG PO TABS
20.0000 mg | ORAL_TABLET | Freq: Every day | ORAL | Status: DC
  Administered 2019-02-11: 20 mg via ORAL
  Filled 2019-02-10: qty 4

## 2019-02-10 MED ORDER — GLYCOPYRROLATE PF 0.2 MG/ML IJ SOSY
PREFILLED_SYRINGE | INTRAMUSCULAR | Status: AC
  Filled 2019-02-10: qty 1

## 2019-02-10 MED ORDER — DEXAMETHASONE SODIUM PHOSPHATE 10 MG/ML IJ SOLN
INTRAMUSCULAR | Status: DC | PRN
  Administered 2019-02-10: 10 mg via INTRAVENOUS

## 2019-02-10 MED ORDER — FENTANYL CITRATE (PF) 250 MCG/5ML IJ SOLN
INTRAMUSCULAR | Status: AC
  Filled 2019-02-10: qty 5

## 2019-02-10 MED ORDER — ROCURONIUM BROMIDE 100 MG/10ML IV SOLN
INTRAVENOUS | Status: DC | PRN
  Administered 2019-02-10: 50 mg via INTRAVENOUS

## 2019-02-10 MED ORDER — LACTATED RINGERS IV SOLN: INTRAVENOUS | Status: AC

## 2019-02-10 MED ORDER — DOCUSATE SODIUM 100 MG PO CAPS
100.0000 mg | ORAL_CAPSULE | Freq: Two times a day (BID) | ORAL | Status: DC
  Administered 2019-02-10: 100 mg via ORAL
  Filled 2019-02-10 (×2): qty 1

## 2019-02-10 MED ORDER — METHOCARBAMOL 500 MG PO TABS
500.0000 mg | ORAL_TABLET | Freq: Four times a day (QID) | ORAL | Status: DC | PRN
  Administered 2019-02-10: 500 mg via ORAL

## 2019-02-10 MED ORDER — METHOCARBAMOL 500 MG PO TABS
ORAL_TABLET | ORAL | Status: AC
  Filled 2019-02-10: qty 1

## 2019-02-10 MED ORDER — PHENYLEPHRINE HCL 10 MG/ML IJ SOLN
INTRAMUSCULAR | Status: DC | PRN
  Administered 2019-02-10: 80 ug via INTRAVENOUS

## 2019-02-10 MED ORDER — GLYCOPYRROLATE 0.2 MG/ML IJ SOLN
INTRAMUSCULAR | Status: DC | PRN
  Administered 2019-02-10: 0.1 mg via INTRAVENOUS

## 2019-02-10 MED ORDER — PROPOFOL 10 MG/ML IV BOLUS
INTRAVENOUS | Status: DC | PRN
  Administered 2019-02-10: 150 mg via INTRAVENOUS

## 2019-02-10 MED ORDER — EPHEDRINE SULFATE 50 MG/ML IJ SOLN
INTRAMUSCULAR | Status: DC | PRN
  Administered 2019-02-10: 10 mg via INTRAVENOUS

## 2019-02-10 MED ORDER — OXYCODONE HCL 5 MG PO TABS
5.0000 mg | ORAL_TABLET | ORAL | Status: DC | PRN
  Administered 2019-02-10: 10 mg via ORAL

## 2019-02-10 MED ORDER — FENTANYL CITRATE (PF) 100 MCG/2ML IJ SOLN
INTRAMUSCULAR | Status: AC
  Filled 2019-02-10: qty 2

## 2019-02-10 MED ORDER — PHENOL 1.4 % MT LIQD: 1.0000 | OROMUCOSAL | Status: DC | PRN

## 2019-02-10 MED ORDER — ACETAMINOPHEN 500 MG PO TABS
1000.0000 mg | ORAL_TABLET | Freq: Four times a day (QID) | ORAL | Status: DC
  Administered 2019-02-10 – 2019-02-11 (×2): 1000 mg via ORAL
  Filled 2019-02-10 (×2): qty 2

## 2019-02-10 MED ORDER — WHITE PETROLATUM EX OINT
TOPICAL_OINTMENT | CUTANEOUS | Status: AC
  Administered 2019-02-10: 21:00:00
  Filled 2019-02-10: qty 28.35

## 2019-02-10 MED ORDER — FENTANYL CITRATE (PF) 100 MCG/2ML IJ SOLN
INTRAMUSCULAR | Status: DC | PRN
  Administered 2019-02-10: 100 ug via INTRAVENOUS

## 2019-02-10 MED ORDER — DEXAMETHASONE SODIUM PHOSPHATE 10 MG/ML IJ SOLN
INTRAMUSCULAR | Status: AC
  Filled 2019-02-10: qty 1

## 2019-02-10 MED ORDER — METOCLOPRAMIDE HCL 5 MG PO TABS: 5.0000 mg | ORAL_TABLET | Freq: Three times a day (TID) | ORAL | Status: DC | PRN

## 2019-02-10 MED ORDER — METOCLOPRAMIDE HCL 5 MG/ML IJ SOLN: 5.0000 mg | Freq: Three times a day (TID) | INTRAMUSCULAR | Status: DC | PRN

## 2019-02-10 MED ORDER — CEFAZOLIN SODIUM-DEXTROSE 2-4 GM/100ML-% IV SOLN
2.0000 g | Freq: Four times a day (QID) | INTRAVENOUS | Status: AC
  Administered 2019-02-10 – 2019-02-11 (×3): 2 g via INTRAVENOUS
  Filled 2019-02-10 (×3): qty 100

## 2019-02-10 MED ORDER — MIDAZOLAM HCL 2 MG/2ML IJ SOLN
2.0000 mg | Freq: Once | INTRAMUSCULAR | Status: AC
  Administered 2019-02-10: 2 mg via INTRAVENOUS

## 2019-02-10 MED ORDER — ROCURONIUM BROMIDE 50 MG/5ML IV SOSY
PREFILLED_SYRINGE | INTRAVENOUS | Status: AC
  Filled 2019-02-10: qty 10

## 2019-02-10 SURGICAL SUPPLY — 78 items
ALCOHOL 70% 16 OZ (MISCELLANEOUS) ×3 IMPLANT
BASEPLATE GLENOSPHERE 25 (Plate) ×2 IMPLANT
BASEPLATE GLENOSPHERE 25MM (Plate) ×1 IMPLANT
BEARING HUMERAL SHLDER 36M STD (Shoulder) ×1 IMPLANT
BIT DRILL TWIST 2.7 (BIT) ×2 IMPLANT
BIT DRILL TWIST 2.7MM (BIT) ×1
BLADE SAW SGTL 13X75X1.27 (BLADE) ×3 IMPLANT
CHLORAPREP W/TINT 26ML (MISCELLANEOUS) ×3 IMPLANT
CLOSURE WOUND 1/2 X4 (GAUZE/BANDAGES/DRESSINGS) ×1
COVER SURGICAL LIGHT HANDLE (MISCELLANEOUS) ×3 IMPLANT
COVER WAND RF STERILE (DRAPES) IMPLANT
DRAPE INCISE IOBAN 66X45 STRL (DRAPES) ×3 IMPLANT
DRAPE U-SHAPE 47X51 STRL (DRAPES) ×6 IMPLANT
DRSG AQUACEL AG ADV 3.5X10 (GAUZE/BANDAGES/DRESSINGS) ×3 IMPLANT
ELECT BLADE 4.0 EZ CLEAN MEGAD (MISCELLANEOUS) ×3
ELECT REM PT RETURN 9FT ADLT (ELECTROSURGICAL) ×3
ELECTRODE BLDE 4.0 EZ CLN MEGD (MISCELLANEOUS) ×1 IMPLANT
ELECTRODE REM PT RTRN 9FT ADLT (ELECTROSURGICAL) ×1 IMPLANT
GAUZE SPONGE 4X4 12PLY STRL LF (GAUZE/BANDAGES/DRESSINGS) ×3 IMPLANT
GLENOID SPHERE STD STRL 36MM (Orthopedic Implant) ×3 IMPLANT
GLOVE BIOGEL PI IND STRL 7.5 (GLOVE) ×1 IMPLANT
GLOVE BIOGEL PI IND STRL 8 (GLOVE) ×1 IMPLANT
GLOVE BIOGEL PI INDICATOR 7.5 (GLOVE) ×2
GLOVE BIOGEL PI INDICATOR 8 (GLOVE) ×2
GLOVE ECLIPSE 7.0 STRL STRAW (GLOVE) ×3 IMPLANT
GLOVE SURG ORTHO 8.0 STRL STRW (GLOVE) ×3 IMPLANT
GOWN STRL REUS W/ TWL LRG LVL3 (GOWN DISPOSABLE) ×2 IMPLANT
GOWN STRL REUS W/ TWL XL LVL3 (GOWN DISPOSABLE) ×2 IMPLANT
GOWN STRL REUS W/TWL LRG LVL3 (GOWN DISPOSABLE) ×4
GOWN STRL REUS W/TWL XL LVL3 (GOWN DISPOSABLE) ×4
HYDROGEN PEROXIDE 16OZ (MISCELLANEOUS) ×3 IMPLANT
KIT BASIN OR (CUSTOM PROCEDURE TRAY) ×3 IMPLANT
KIT TURNOVER KIT B (KITS) ×3 IMPLANT
LOOP VESSEL MAXI BLUE (MISCELLANEOUS) ×3 IMPLANT
MANIFOLD NEPTUNE II (INSTRUMENTS) ×3 IMPLANT
MODEL GLENOID TOTAL SIGNATURE (SYSTAGENIX WOUND MANAGEMENT) ×3 IMPLANT
NDL SUT 6 .5 CRC .975X.05 MAYO (NEEDLE) IMPLANT
NEEDLE MAYO TAPER (NEEDLE)
NEEDLE TAPERED W/ NITINOL LOOP (MISCELLANEOUS) ×3 IMPLANT
NS IRRIG 1000ML POUR BTL (IV SOLUTION) ×3 IMPLANT
PACK SHOULDER (CUSTOM PROCEDURE TRAY) ×3 IMPLANT
PAD ARMBOARD 7.5X6 YLW CONV (MISCELLANEOUS) ×6 IMPLANT
PIN HUMERAL STMN 3.2MMX9IN (INSTRUMENTS) ×3 IMPLANT
RESTRAINT HEAD UNIVERSAL NS (MISCELLANEOUS) ×3 IMPLANT
RETRIEVER SUT HEWSON (MISCELLANEOUS) ×6 IMPLANT
SCREW BONE STRL 6.5MMX30MM (Screw) ×3 IMPLANT
SCREW LOCKING 4.75MMX15MM (Screw) ×6 IMPLANT
SCREW LOCKING NS 4.75MMX20MM (Screw) ×3 IMPLANT
SCREW LOCKING STRL 4.75X25X3.5 (Screw) ×3 IMPLANT
SHOULDER HUMERAL BEAR 36M STD (Shoulder) ×3 IMPLANT
SLING ARM IMMOBILIZER LRG (SOFTGOODS) ×3 IMPLANT
SOLUTION BETADINE 4OZ (MISCELLANEOUS) ×3 IMPLANT
SPONGE LAP 18X18 X RAY DECT (DISPOSABLE) ×3 IMPLANT
STEM HUMERAL STRL 11MMX83MM (Stem) ×3 IMPLANT
STRIP CLOSURE SKIN 1/2X4 (GAUZE/BANDAGES/DRESSINGS) ×2 IMPLANT
SUCTION FRAZIER HANDLE 10FR (MISCELLANEOUS) ×2
SUCTION TUBE FRAZIER 10FR DISP (MISCELLANEOUS) ×1 IMPLANT
SUT BROADBAND TAPE 2PK 1.5 (SUTURE) ×9 IMPLANT
SUT ETHILON 3 0 PS 1 (SUTURE) ×15 IMPLANT
SUT FIBERWIRE #2 38 T-5 BLUE (SUTURE) ×3
SUT MAXBRAID (SUTURE) IMPLANT
SUT MNCRL AB 3-0 PS2 18 (SUTURE) ×3 IMPLANT
SUT SILK 2 0 TIES 10X30 (SUTURE) ×3 IMPLANT
SUT VIC AB 0 CT1 27 (SUTURE) ×6
SUT VIC AB 0 CT1 27XBRD ANBCTR (SUTURE) ×3 IMPLANT
SUT VIC AB 1 CT1 27 (SUTURE) ×6
SUT VIC AB 1 CT1 27XBRD ANBCTR (SUTURE) ×3 IMPLANT
SUT VIC AB 2-0 CT1 27 (SUTURE) ×6
SUT VIC AB 2-0 CT1 TAPERPNT 27 (SUTURE) ×3 IMPLANT
SUT VICRYL 0 AB UR-6 (SUTURE) ×15 IMPLANT
SUTURE FIBERWR #2 38 T-5 BLUE (SUTURE) ×1 IMPLANT
TOWEL OR 17X26 10 PK STRL BLUE (TOWEL DISPOSABLE) ×3 IMPLANT
TRAY FOLEY BAG SILVER LF 16FR (CATHETERS) IMPLANT
TRAY HUM REV SHOULDER STD +6 (Shoulder) ×3 IMPLANT
TUBE CONNECTING 12'X1/4 (SUCTIONS) ×1
TUBE CONNECTING 12X1/4 (SUCTIONS) ×2 IMPLANT
WATER STERILE IRR 1000ML POUR (IV SOLUTION) IMPLANT
YANKAUER SUCT BULB TIP NO VENT (SUCTIONS) ×6 IMPLANT

## 2019-02-10 NOTE — H&P (Signed)
Tiffany [name] is an 73 y.o. female.   Chief Complaint: Right shoulder pain HPI: Tiffany [name] is a 73 year old patient with a long history of right shoulder pain.  She has rotator cuff arthropathy and arthritis in that shoulder confirmed by multiple imaging studies.  She reports rest pain and night pain as well as significant limitation of activities of daily living due to her right shoulder.  Patient did have a cold last week but she is significantly improved from that and feels like she is back to normal.  She presents now for operative management of her right shoulder arthritis after explanation of risks and benefits.  Past Medical History:  Diagnosis Date  . Anemia   . Arthritis   . Cancer ([place])    lung cancer 2016  . Gait difficulty    problems with balance  . High blood pressure   . History of blood transfusion    05-14-15  . Hyperlipidemia   . Hyperthyroidism mild  . Neuropathy   . Obesity (BMI 35.0-39.9 without comorbidity)   . Pre-diabetes   . 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 Vitamin D deficiency   . Wears glasses   . Wears partial dentures     Past Surgical History:  Procedure Laterality Date  . CERVICAL DISC ARTHROPLASTY    . CRYO INTERCOSTAL NERVE BLOCK Left 05/09/2015   Procedure: CRYO INTERCOSTAL NERVE BLOCK;  Surgeon: [name], MD;  Location: [facility];  Service: Thoracic;  Laterality: Left;  . ESOPHAGOGASTRODUODENOSCOPY N/A 05/16/2015   Procedure: ESOPHAGOGASTRODUODENOSCOPY (EGD);  Surgeon: Irene Shipper, MD;  Location: [facility] ENDOSCOPY;  Service: Endoscopy;  Laterality: N/A;  possible ablation of a bleeding lesion  . knee replaced     right knee x 2  . LOBECTOMY Left 05/09/2015   Procedure: LEFT LUNG UPPER LOBECTOMY;  Surgeon: [name], MD;  Location: [facility];  Service: Thoracic;  Laterality: Left;  . LYMPH NODE DISSECTION Left 05/09/2015   Procedure: LYMPH NODE DISSECTION;  Surgeon: [name], MD;  Location: [facility];  Service:  Thoracic;  Laterality: Left;  Marland Kitchen MULTIPLE TOOTH EXTRACTIONS    . ROTATOR CUFF REPAIR Right   . SPINAL FUSION    . TONSILLECTOMY  age 73  . TOTAL HIP ARTHROPLASTY Left 07/13/2015   Procedure: LEFT TOTAL HIP ARTHROPLASTY ANTERIOR APPROACH;  Surgeon: Mcarthur Rossetti, MD;  Location: [facility];  Service: Orthopedics;  Laterality: Left;  Marland Kitchen VAGINAL HYSTERECTOMY    . VIDEO ASSISTED THORACOSCOPY (VATS)/WEDGE RESECTION Left 05/09/2015   Procedure: LEFT VIDEO ASSISTED THORACOSCOPY (VATS) WITH LEFT LUNG UPPER LOBE WEDGE RESECTION;  Surgeon: [name], MD;  Location: [facility];  Service: Thoracic;  Laterality: Left;  Marland Kitchen VIDEO BRONCHOSCOPY WITH ENDOBRONCHIAL NAVIGATION N/A 04/18/2015   Procedure: VIDEO BRONCHOSCOPY WITH ENDOBRONCHIAL NAVIGATION;  Surgeon: Collene Gobble, MD;  Location: [facility];  Service: Thoracic;  Laterality: N/A;  . VIDEO BRONCHOSCOPY WITH ENDOBRONCHIAL ULTRASOUND N/A 04/18/2015   Procedure: VIDEO BRONCHOSCOPY WITH ENDOBRONCHIAL ULTRASOUND;  Surgeon: Collene Gobble, MD;  Location: [facility];  Service: Thoracic;  Laterality: N/A;    Family History  Problem Relation Age of Onset  . Heart attack Father   . Hypertension Mother   . Breast cancer Maternal Aunt   . Colon cancer Paternal Grandmother    Social History:  reports that she quit smoking about 3 years ago. Her smoking use included cigarettes. She has a 30.00 pack-year smoking history. She has never used smokeless tobacco.  She reports current alcohol use of about 2.0 standard drinks of alcohol per week. She reports that she does not use drugs.  Allergies:  Allergies  Allergen Reactions  . Other Other (See Comments)    Stool softeners caused stomach bloating, nausea, vomiting bleeding ulcer and blood transfusion    No medications prior to admission.    No results found for this or any previous visit (from the past 48 hour(s)). No results found.  Review of Systems  Musculoskeletal: Positive for joint pain.  All other  systems reviewed and are negative.   There were no vitals taken for this visit. Physical Exam  Constitutional: She appears well-developed.  HENT:  Head: Normocephalic.  Eyes: Pupils are equal, round, and reactive to light.  Neck: Neck supple.  Cardiovascular: Normal rate.  Respiratory: Effort normal.  Neurological: She is alert.  Skin: Skin is warm.  Psychiatric: She has a normal mood and affect.  Examination of the right shoulder demonstrates functional deltoid with significant limitation of forward flexion abduction.  Patient does have some weakness to infraspinatus and supraspinatus testing.  Radial pulses intact.  No masses lymphadenopathy or skin changes noted in that right shoulder girdle region.  Assessment/Plan Impression is right shoulder arthritis and rotator cuff arthropathy.  Plan is right reverse shoulder replacement.  The risk benefits are discussed including but not limited to infection nerve vessel damage incomplete restoration of function and incomplete pain relief.  Patient understands the risk and benefits and wishes to proceed.  All questions answered.  Patient has not had any fevers or chills or pulmonary issues in the last 5 days.  Anderson Malta, MD 02/10/2019, 8:06 AM

## 2019-02-10 NOTE — Progress Notes (Signed)
Pt arrived to room 5N14 via bed after surgery. Received report from [place], [place] in PACU. See assessment. Will continue to monitor.

## 2019-02-10 NOTE — Brief Op Note (Signed)
02/10/2019  5:43 PM  PATIENT:  Tiffany [name]  73 y.o. female  PRE-OPERATIVE DIAGNOSIS:  right rotator cuff arthropathy  POST-OPERATIVE DIAGNOSIS:  right rotator cuff arthropathy  PROCEDURE:  Procedure(s): RIGHT REVERSE SHOULDER ARTHROPLASTY  SURGEON:  Surgeon(s): Marlou Sa, Tonna Corner, MD  ASSISTANT: green and Elvis Coil rnfa  ANESTHESIA:   general  EBL: 100 ml    Total I/O In: 1000 [I.V.:1000] Out: 300 [Blood:300]  BLOOD ADMINISTERED: none  DRAINS: none   LOCAL MEDICATIONS USED:  none  SPECIMEN:  No Specimen  COUNTS:  YES  TOURNIQUET:  * No tourniquets in log *  DICTATION: .Other Dictation: Dictation Number done no number given  PLAN OF CARE: Admit to inpatient   PATIENT DISPOSITION:  PACU - hemodynamically stable

## 2019-02-10 NOTE — Transfer of Care (Signed)
Immediate Anesthesia Transfer of Care Note  Patient: Tiffany [name]  Procedure(s) Performed: RIGHT REVERSE SHOULDER ARTHROPLASTY (Right Shoulder)  Patient Location: PACU  Anesthesia Type:GA combined with regional for post-op pain  Level of Consciousness: awake, alert  and oriented  Airway & Oxygen Therapy: Patient Spontanous Breathing and Patient connected to nasal cannula oxygen  Post-op Assessment: Report given to RN, Post -op Vital signs reviewed and stable and Patient moving all extremities  Post vital signs: Reviewed and stable  Last Vitals:  Vitals Value Taken Time  BP 143/62 02/10/2019  5:45 PM  Temp 36.2 C 02/10/2019  5:43 PM  Pulse 70 02/10/2019  5:52 PM  Resp 18 02/10/2019  5:52 PM  SpO2 100 % 02/10/2019  5:52 PM  Vitals shown include unvalidated device data.  Last Pain:  Vitals:   02/10/19 1743  TempSrc:   PainSc: 0-No pain         Complications: No apparent anesthesia complications

## 2019-02-10 NOTE — Anesthesia Procedure Notes (Signed)
Procedure Name: Intubation Date/Time: 02/10/2019 2:09 PM Performed by: Mckinleigh Schuchart T, CRNA Pre-anesthesia Checklist: Patient identified, Emergency Drugs available, Suction available and Patient being monitored Patient Re-evaluated:Patient Re-evaluated prior to induction Oxygen Delivery Method: Circle system utilized Preoxygenation: Pre-oxygenation with 100% oxygen Induction Type: IV induction Ventilation: Mask ventilation without difficulty Laryngoscope Size: Miller and 2 Grade View: Grade I Tube type: Oral Tube size: 7.5 mm Number of attempts: 1 Airway Equipment and Method: Patient positioned with wedge pillow and Stylet Placement Confirmation: ETT inserted through vocal cords under direct vision,  positive ETCO2 and breath sounds checked- equal and bilateral Secured at: 22 cm Tube secured with: Tape Dental Injury: Teeth and Oropharynx as per pre-operative assessment

## 2019-02-10 NOTE — Anesthesia Preprocedure Evaluation (Signed)
Anesthesia Evaluation  Patient identified by MRN, date of birth, ID band Patient awake    Reviewed: Allergy & Precautions, H&P , NPO status , Patient's Chart, lab work & pertinent test results, reviewed documented beta blocker date and time   Airway Mallampati: II  TM Distance: >3 FB Neck ROM: full    Dental  (+) Dental Advisory Given, Loose, Caps Very loose right upper front tooth.  Patient advised that this tooth will probably come out during or after this surgery.  All upper front are capped.:   Pulmonary Current Smoker, former smoker,  LUL nodule. Lung cancer s/p VATS x 2   Pulmonary exam normal breath sounds clear to auscultation       Cardiovascular Exercise Tolerance: Good hypertension, Pt. on home beta blockers and Pt. on medications Normal cardiovascular exam Rhythm:regular Rate:Normal  Respiratory status is OK according to patient   Neuro/Psych negative neurological ROS  negative psych ROS   GI/Hepatic Neg liver ROS, PUD, GERD  Medicated and Controlled,  Endo/Other  negative endocrine ROSHyperthyroidism Obese. Pre - diabetes  Renal/GU negative Renal ROS  negative genitourinary   Musculoskeletal  (+) Arthritis ,   Abdominal (+) + obese,   Peds  Hematology   Anesthesia Other Findings   Reproductive/Obstetrics negative OB ROS                             Anesthesia Physical  Anesthesia Plan  ASA: III  Anesthesia Plan: General   Post-op Pain Management:  Regional for Post-op pain   Induction: Intravenous  PONV Risk Score and Plan: 3 and Ondansetron and Dexamethasone  Airway Management Planned: Oral ETT  Additional Equipment:   Intra-op Plan:   Post-operative Plan: Extubation in OR  Informed Consent: I have reviewed the patients History and Physical, chart, labs and discussed the procedure including the risks, benefits and alternatives for the proposed anesthesia with the  patient or authorized representative who has indicated his/her understanding and acceptance.     Dental Advisory Given  Plan Discussed with: CRNA  Anesthesia Plan Comments:         Anesthesia Quick Evaluation

## 2019-02-11 ENCOUNTER — Encounter (HOSPITAL_COMMUNITY): Payer: Self-pay | Admitting: Orthopedic Surgery

## 2019-02-11 MED ORDER — HYDROCODONE-ACETAMINOPHEN 10-325 MG PO TABS: 1.0000 | ORAL_TABLET | Freq: Four times a day (QID) | ORAL | 0 refills | Status: DC | PRN

## 2019-02-11 MED ORDER — BUPIVACAINE HCL (PF) 0.5 % IJ SOLN
INTRAMUSCULAR | Status: DC | PRN
  Administered 2019-02-10 (×5): 3 mL via PERINEURAL

## 2019-02-11 MED ORDER — METHOCARBAMOL 500 MG PO TABS: 500.0000 mg | ORAL_TABLET | Freq: Four times a day (QID) | ORAL | 0 refills | Status: DC | PRN

## 2019-02-11 MED ORDER — BUPIVACAINE LIPOSOME 1.3 % IJ SUSP
INTRAMUSCULAR | Status: DC | PRN
  Administered 2019-02-10 (×5): 2 mL via PERINEURAL

## 2019-02-11 NOTE — Progress Notes (Signed)
Provided discharge education/instructions, all questions and concerns addressed, Pt not in distress. Pt and husband want to go home and said that they had been through this before, they both feel safe to stand, turn and transfer from bed to chair/wheelchair on a gait belt. They have a wheelchair at home to use for daily activities. Husband will be there to provided care and supervision 24/7. Discharged home with belongings accompanied by husband.

## 2019-02-11 NOTE — Plan of Care (Signed)

## 2019-02-11 NOTE — Anesthesia Procedure Notes (Addendum)
Anesthesia Regional Block: Interscalene brachial plexus block   Pre-Anesthetic Checklist: ,, timeout performed, Correct Patient, Correct Site, Correct Laterality, Correct Procedure, Correct Position, site marked, Risks and benefits discussed,  Surgical consent,  Pre-op evaluation,  At surgeon's request and post-op pain management  Laterality: Right and Upper  Prep: chloraprep       Needles:  Injection technique: Single-shot  Needle Type: Echogenic Stimulator Needle     Needle Length: 10cm  Needle Gauge: 21   Needle insertion depth: 1 cm   Additional Needles:   Procedures:,,,, ultrasound used (permanent image in chart),,,,  Narrative:  Start time: 02/10/2019 1:00 PM End time: 02/10/2019 1:10 PM Injection made incrementally with aspirations every 5 mL.  Performed by: Personally  Anesthesiologist: Lyn Hollingshead, MD       Right interscalene Block

## 2019-02-11 NOTE — Op Note (Signed)
NAME: Tiffany [name], Tiffany [name] MEDICAL RECORD UU:[id number] ACCOUNT [id number] DATE OF BIRTH:[birth year] FACILITY: [facility] LOCATION: [facility] PHYSICIAN:Neveah Bang Diamantina Providence, MD  OPERATIVE REPORT  DATE OF PROCEDURE:    PREOPERATIVE DIAGNOSIS:  Right shoulder rotator cuff arthropathy.  POSTOPERATIVE DIAGNOSIS:  Right shoulder rotator cuff arthropathy.    Procedure: Right reverse shoulder replacement using [employer] components glenosphere 36 standard with 25 mm baseplate size 11 mini stem with standard mini humeral tray +6 offset 40 mm in diameter with 36 standard poly-bearing  Surgeon attending Cammy Copa, MD Assistant April Green, RNFA  Anesthesia: General plus preoperative Exparel interscalene block  Estimated blood loss 100 cc  Indications: Tiffany [name] is a 73 year old female with right shoulder pain refractory to nonoperative management.  She has rotator cuff arthropathy and is failed conservative measures including injection and therapy.  She presents now for operative management after explanation of risks and benefits  Procedure in detail  Patient was brought to the operating room where general anesthetic was induced preoperative lilacs measured.  Timeout was called.  Patient placed in the beachchair position with the head in neutral position.  Right shoulder arm and hand prescribed with hydrogen peroxide alcohol and Betadine which was allowed to air dry then prepped with DuraPrep solution and draped in a sterile manner.  Deltopectoral approach was made.  Skin and subcu tissue sharply divided.  Biceps tendon was identified and tenodesed to the pec tendon.  Rotator cuff arthropathy was present.  Subscapularis remnant was detached from the lesser tuberosity.  The circumflex vessels were ligated.  Axillary nerve was visualized and a vessel loop was placed around it.  It was protected at all times during the remaining portion of the case.  Using progressive external rotation the capsule was detached from the  inferior surface of the humeral neck.  This was done around to the 5 o'clock position.  Dislocation was performed.  The canal was then entered with a with the reamer.  Reaming was performed up to accommodate a size 11 stem.  The head cut was then made in approximately 30 degrees of retroversion.  This was done at the level of the rotator cuff attachment and tuberosity.  Cap was placed and then attention was directed towards the glenoid.  The labrum was excised.  Anterior and posterior retractors on the glenoid were placed.  In accordance with preoperative templating guide was applied and the pin was placed into the central part of the glenoid.  The reaming was performed and baseplate was utilized.  We achieved very good bony contact with the baseplate.  Secure fixation achieved with 1 central screw and 4 peripheral locking screws.  36 standard based glenosphere was placed.  Attention then directed towards the humerus.  Broaching was performed up to size 11 and the shoulder was reduced.  With a +6 offset standard humeral tray we are able to achieve reduction which was stable.  Patient had good stability with extension and adduction along with a forward flexion force along with not too much laxity with lateral translation.  Patient was able to achieve forward flexion abduction both above 90 degrees.  Trial components on the humeral side removed.  Thorough irrigation performed.  True components placed with same stability parameters maintained.  At this time thorough irrigation was performed.  The subscap was reattached through previously placed drill holes with the arm in about 30 degrees of external rotation.  This primarily to the inferior aspect of the humeral neck.  Vancomycin powder placed within the joint.  Thorough irrigation again performed prior to bank placement.  Deltopectoral level closed using #1 Vicryl suture followed by interrupted inverted 0 Vicryl suture Vicryl suture and then we used 3-0 nylon in the  skin because the skin itself was of generally poor quality.  Aquasol dressing placed along with a shoulder sling.  Patient tolerated procedure well without immediate complication and transferred to recovery room in stable condition.  End dictation  TN/NUANCE  D:02/10/2019 T:02/10/2019 JOB:[id number]

## 2019-02-11 NOTE — Progress Notes (Signed)
Subjective: Pt stable - pain ok   Objective: Vital signs in last 24 hours: Temp:  [97.2 F (36.2 C)-98 F (36.7 C)] 98 F (36.7 C) (02/14 0804) Pulse Rate:  [58-87] 87 (02/14 0804) Resp:  [12-25] 22 (02/14 0804) BP: (116-207)/(62-87) 136/67 (02/14 0804) SpO2:  [89 %-100 %] 94 % (02/14 0804) Weight:  [95.3 kg] 95.3 kg (02/13 1119)  Intake/Output from previous day: 02/13 0701 - 02/14 0700 In: 1520 [P.O.:120; I.V.:1200; IV Piggyback:200] Out: 300 [Blood:300] Intake/Output this shift: No intake/output data recorded.  Exam:  No cellulitis present Compartment soft  Labs: No results for input(s): HGB in the last 72 hours. No results for input(s): WBC, RBC, HCT, PLT in the last 72 hours. No results for input(s): NA, K, CL, CO2, BUN, CREATININE, GLUCOSE, CALCIUM in the last 72 hours. No results for input(s): LABPT, INR in the last 72 hours.  Assessment/Plan: Plan dc today after ot   [employer] 02/11/2019, 8:15 AM

## 2019-02-11 NOTE — Anesthesia Postprocedure Evaluation (Signed)
Anesthesia Post Note  Patient: Tiffany [name]  Procedure(s) Performed: RIGHT REVERSE SHOULDER ARTHROPLASTY (Right Shoulder)     Patient location during evaluation: PACU Anesthesia Type: General Level of consciousness: sedated Pain management: pain level controlled Vital Signs Assessment: post-procedure vital signs reviewed and stable Respiratory status: spontaneous breathing and respiratory function stable Cardiovascular status: stable Postop Assessment: no apparent nausea or vomiting Anesthetic complications: no                   [name] DANIEL

## 2019-02-11 NOTE — Evaluation (Signed)
Physical Therapy Evaluation Patient Details Name: Tiffany [name] MRN: [id number] DOB: 30-Aug-1946 Today's Date: 02/11/2019   History of Present Illness  Rt reverse TSA 2/13.   Clinical Impression  Pt admitted with above diagnosis. Pt currently with functional limitations due to the deficits listed below (see "PT Problem List"). Upon entry, pt in chair, husband present, OT exiting after evaluation. The pt is awake and agreeable to participate. The pt is alert and oriented x3, pleasant, conversational, and following simple commands consistently.  Pt struggles to rise from chair with single UE, and furthermore, is unable to do so with SPC in hand, meaning absolute assistance for safety during transfers 2/2 baseline balance deficits 2/2 PND. AMB is labored and difficulty, several pauses and tachypnea to AMB 44ft total, but unable to make it to the doorway. Functional mobility assessment demonstrates increased effort/time requirements, poor tolerance, and need for physical assistance, whereas the patient performed these at a higher level of independence PTA. STR at SNF would improve indpeendence with basic household mobility and reduce risk of falls. Pt will benefit from skilled PT intervention to increase independence and safety with basic mobility in preparation for discharge to the venue listed below.       Follow Up Recommendations SNF    Equipment Recommendations  None recommended by PT    Recommendations for Other Services       Precautions / Restrictions Precautions Precautions: Fall Required Braces or Orthoses: Sling Restrictions Weight Bearing Restrictions: Yes RUE Weight Bearing: Non weight bearing      Mobility  Bed Mobility               General bed mobility comments: received in chair   Transfers Overall transfer level: Needs assistance Equipment used: None Transfers: Sit to/from Stand Sit to Stand: Min assist;Min guard         General transfer comment: poor  sequencing, unsafe technique, still trying to use RUE impulsively at times. unsafe trnasition of SPC from bed to hand s/p standing.   Ambulation/Gait Ambulation/Gait assistance: Min guard Gait Distance (Feet): 14 Feet Assistive device: Straight cane       General Gait Details: high exertion, stops several times d/t fatigue, doesn't make it to the doorway prior to need to turn around and return to chair.   Stairs            Wheelchair Mobility    Modified Rankin (Stroke Patients Only)       Balance Overall balance assessment: Needs assistance         Standing balance support: Single extremity supported;During functional activity Standing balance-Leahy Scale: Poor Standing balance comment: Peripheral neuropathy with baselne balance deficits                             Pertinent Vitals/Pain Pain Assessment: Faces Faces Pain Scale: No hurt    Home Living Family/patient expects to be discharged to:: Private residence Living Arrangements: Spouse/significant other                    Prior Function           Comments: Household AMB only c SPC, not tolerating of community distances      Higher education careers adviser        Extremity/Trunk Assessment   Upper Extremity Assessment Upper Extremity Assessment: Generalized weakness    Lower Extremity Assessment Lower Extremity Assessment: Generalized weakness       Communication  Cognition Arousal/Alertness: Awake/alert Behavior During Therapy: WFL for tasks assessed/performed Overall Cognitive Status: Within Functional Limits for tasks assessed                                        General Comments      Exercises     Assessment/Plan    PT Assessment Patient needs continued PT services  PT Problem List Decreased activity tolerance;Decreased balance;Decreased mobility;Decreased strength       PT Treatment Interventions DME instruction;Gait training;Therapeutic  exercise;Functional mobility training;Therapeutic activities    PT Goals (Current goals can be found in the Care Plan section)  Acute Rehab PT Goals Patient Stated Goal: improve walking capcaity  PT Goal Formulation: With patient Time For Goal Achievement: 02/25/19 Potential to Achieve Goals: Fair    Frequency Min 3X/week   Barriers to discharge   pt strugglign to perform room to room distances at PT eval    Co-evaluation               AM-PAC PT "6 Clicks" Mobility  Outcome Measure Help needed turning from your back to your side while in a flat bed without using bedrails?: A Lot Help needed moving from lying on your back to sitting on the side of a flat bed without using bedrails?: A Lot Help needed moving to and from a bed to a chair (including a wheelchair)?: A Little Help needed standing up from a chair using your arms (e.g., wheelchair or bedside chair)?: A Little Help needed to walk in hospital room?: A Little Help needed climbing 3-5 steps with a railing? : A Lot 6 Click Score: 15    End of Session   Activity Tolerance: Patient limited by fatigue;No increased pain Patient left: in chair;with family/visitor present;with call bell/phone within reach Nurse Communication: Mobility status PT Visit Diagnosis: Unsteadiness on feet (R26.81);Other abnormalities of gait and mobility (R26.89);Difficulty in walking, not elsewhere classified (R26.2)    Time: 1040-1050 PT Time Calculation (min) (ACUTE ONLY): 10 min   Charges:   PT Evaluation $PT Eval Moderate Complexity: 1 Mod          10:59 AM, 02/11/19 Rosamaria Lints, PT, DPT Physical Therapist - [place] [phone] (Pager)  [phone] (Office)     Jinger Middlesworth C 02/11/2019, 10:57 AM

## 2019-02-11 NOTE — Evaluation (Signed)
Occupational Therapy Evaluation Patient Details Name: Tiffany [name] MRN: [id number] DOB: 1946/06/15 Today's Date: 02/11/2019    History of Present Illness Pt is a 73 y/o female s/p R TSA. Pt has a PMH including Arthritis, Cancer, Gait difficulty, High blood pressure, Hyperlipidemia, Hyperthyroidism, Neuropathy, Obesity, THA (07/13/2015), Cervical disc arthroplasty; Spinal fusion; knee replaced; thoracoscopy (vats)/wedge resection (Left, 05/09/2015); Lobectomy (Left, 05/09/2015); Lymph node dissection (Left, 05/09/2015).    Clinical Impression   PTA Pt needed assistance from [facility] for mobility and husband heloed PRN with ADL due to limitations in RUE. Pt is currently max A for sling management, dressing. Education provided in Leisure centre manager, sequencing/safety for dressing as well as smart clothing choices, safety for bathing (Pt has a walk in tub), HEP - active protocol as seen below and ordered by MD. Initially when OT entered the room Pt stated "I can't walk" but was able to perform stand pivot transfer with min A and gait belt from EOB to the chair - will defer further gait to PT evaluation. Shoulder education is complete. Will follow acutely and then defer follow up therapy to MD.     Follow Up Recommendations  Follow surgeon's recommendation for DC plan and follow-up therapies;Supervision/Assistance - 24 hour    Equipment Recommendations  None recommended by OT(Pt has appropriate DME at home)    Recommendations for Other Services       Precautions / Restrictions Precautions Precautions: Fall;Shoulder Type of Shoulder Precautions: active protocol Shoulder Interventions: Shoulder sling/immobilizer;Off for dressing/bathing/exercises Precaution Booklet Issued: Yes (comment) Precaution Comments: shoulder dc handout reviewed in full along with HEP handouts Required Braces or Orthoses: Sling Restrictions Weight Bearing Restrictions: Yes RUE Weight Bearing: Non weight bearing       Mobility Bed Mobility Overal bed mobility: Needs Assistance Bed Mobility: Supine to Sit     Supine to sit: HOB elevated;Min guard     General bed mobility comments: use fo grab bars, extra time required  Transfers Overall transfer level: Needs assistance Equipment used: 1 person hand held assist(face to face transfer with gait belt) Transfers: Sit to/from Stand;Stand Pivot Transfers Sit to Stand: Min guard;Min assist Stand pivot transfers: Min guard;Min assist       General transfer comment: vc for sequencing, especially with hand placement    Balance Overall balance assessment: Needs assistance Sitting-balance support: Single extremity supported;Feet supported Sitting balance-Leahy Scale: Good     Standing balance support: Single extremity supported Standing balance-Leahy Scale: Poor Standing balance comment: Peripheral neuropathy with baselne balance deficits                           ADL either performed or assessed with clinical judgement   ADL Overall ADL's : Needs assistance/impaired                         Toilet Transfer: Minimal Chartered loss adjuster Details (indicate cue type and reason): face to face transfer with gait belt           General ADL Comments: see shoulder section below     Vision         Perception     Praxis      Pertinent Vitals/Pain Pain Assessment: 0-10 Pain Score: 1  Faces Pain Scale: No hurt Pain Location: R shoulder Pain Descriptors / Indicators: Dull Pain Intervention(s): Limited activity within patient's tolerance;Monitored during session;Repositioned     Hand Dominance Right   Extremity/Trunk Assessment  Upper Extremity Assessment Upper Extremity Assessment: RUE deficits/detail RUE Deficits / Details: post-op deficits as anticipated RUE: Unable to fully assess due to immobilization RUE Sensation: decreased light touch(block still in place) RUE Coordination: decreased  gross motor   Lower Extremity Assessment Lower Extremity Assessment: Defer to PT evaluation   Cervical / Trunk Assessment Cervical / Trunk Assessment: Other exceptions Cervical / Trunk Exceptions: obese   Communication Communication Communication: No difficulties   Cognition Arousal/Alertness: Awake/alert Behavior During Therapy: WFL for tasks assessed/performed Overall Cognitive Status: Within Functional Limits for tasks assessed                                     General Comments  husband present throughout session to receive all education    Exercises Exercises: Shoulder Shoulder Exercises Pendulum Exercise: AROM;Right;Seated(unsafe in standing) Shoulder Flexion: PROM;AROM;Right;Supine(educated to 90; not performed due to block) Shoulder ABduction: PROM;AROM;Right;Seated(educated to 60; not performed due to block) Shoulder External Rotation: PROM;AROM;Right;Seated(educated to neutral; not performed due to block) Elbow Flexion: AROM;Right;10 reps Elbow Extension: AROM;Right;10 reps;Seated Wrist Flexion: AROM;Right Wrist Extension: AROM;Right Digit Composite Flexion: AROM;Right Composite Extension: AROM;Right Neck Flexion: AROM Neck Extension: AROM Neck Lateral Flexion - Right: AROM Neck Lateral Flexion - Left: AROM   Shoulder Instructions Shoulder Instructions Donning/doffing shirt without moving shoulder: Maximal assistance;Caregiver independent with task Method for sponge bathing under operated UE: Maximal assistance;Caregiver independent with task Donning/doffing sling/immobilizer: Maximal assistance;Caregiver independent with task Correct positioning of sling/immobilizer: Maximal assistance;Caregiver independent with task Pendulum exercises (written home exercise program): Supervision/safety(educated, not performed due to block) ROM for elbow, wrist and digits of operated UE: Supervision/safety Sling wearing schedule (on at all times/off for ADL's):  Independent Proper positioning of operated UE when showering: Supervision/safety Positioning of UE while sleeping: Minimal assistance;Caregiver independent with task    Home Living Family/patient expects to be discharged to:: Private residence Living Arrangements: Spouse/significant other Available Help at Discharge: Family;Available 24 hours/day Type of Home: House Home Access: Level entry(threshold)     Home Layout: One level     Bathroom Shower/Tub: Other (comment)(walk in tub)   Bathroom Toilet: Handicapped height Bathroom Accessibility: Yes How Accessible: Accessible via wheelchair Home Equipment: Walker - 2 wheels;Cane - single point;Grab bars - toilet;Grab bars - tub/shower;Shower seat - built in;Hand held shower head;Wheelchair - manual;Bedside commode          Prior Functioning/Environment Level of Independence: Needs assistance  Gait / Transfers Assistance Needed: uses SPC in LEFT hand with mobility - cannot tolerate community mobilityso has WC ADL's / Homemaking Assistance Needed: has needed assist from husband recently   Comments: Household AMB only c SPC, not tolerating of community distances         OT Problem List: Decreased range of motion;Decreased activity tolerance;Impaired balance (sitting and/or standing);Decreased knowledge of precautions;Obesity;Impaired sensation;Impaired UE functional use;Pain      OT Treatment/Interventions:      OT Goals(Current goals can be found in the care plan section) Acute Rehab OT Goals Patient Stated Goal: I just want to be able to reach things I need, and do my hair OT Goal Formulation: With patient/family Time For Goal Achievement: 02/25/19 Potential to Achieve Goals: Good  OT Frequency:     Barriers to D/C:            Co-evaluation              AM-PAC OT "6 Clicks" Daily Activity  Outcome Measure Help from another person eating meals?: A Little Help from another person taking care of personal  grooming?: A Little Help from another person toileting, which includes using toliet, bedpan, or urinal?: A Little Help from another person bathing (including washing, rinsing, drying)?: A Lot Help from another person to put on and taking off regular upper body clothing?: A Lot Help from another person to put on and taking off regular lower body clothing?: A Lot 6 Click Score: 15   End of Session Equipment Utilized During Treatment: Gait belt Nurse Communication: Precautions;Weight bearing status  Activity Tolerance: Patient tolerated treatment well Patient left: in chair;with call bell/phone within reach;with family/visitor present  OT Visit Diagnosis: Unsteadiness on feet (R26.81);Other abnormalities of gait and mobility (R26.89);Pain Pain - Right/Left: Right Pain - part of body: Shoulder                Time: 1610-9604 OT Time Calculation (min): 56 min Charges:  OT General Charges $OT Visit: 1 Visit OT Evaluation $OT Eval Moderate Complexity: 1 Mod OT Treatments $Self Care/Home Management : 23-37 mins $Therapeutic Exercise: 8-22 mins  Sherryl Manges OTR/L Acute Rehabilitation Services Pager: [phone] Office: [phone]  Tiffany [name] 02/11/2019, 1:38 PM

## 2019-02-11 NOTE — Plan of Care (Signed)
  Problem: Clinical Measurements: Goal: Ability to maintain clinical measurements within normal limits will improve Outcome: Progressing Goal: Will remain free from infection Outcome: Progressing Goal: Diagnostic test results will improve Outcome: Not Applicable Goal: Respiratory complications will improve Outcome: Not Applicable Goal: Cardiovascular complication will be avoided Outcome: Progressing   Problem: Activity: Goal: Risk for activity intolerance will decrease Outcome: Progressing   Problem: Nutrition: Goal: Adequate nutrition will be maintained Outcome: Progressing

## 2019-02-18 ENCOUNTER — Other Ambulatory Visit: Payer: Self-pay | Admitting: Internal Medicine

## 2019-02-18 ENCOUNTER — Other Ambulatory Visit: Payer: Medicare Other

## 2019-02-18 ENCOUNTER — Inpatient Hospital Stay (INDEPENDENT_AMBULATORY_CARE_PROVIDER_SITE_OTHER): Payer: Medicare Other | Admitting: Orthopedic Surgery

## 2019-02-18 DIAGNOSIS — R300 Dysuria: Secondary | ICD-10-CM

## 2019-02-18 MED ORDER — CIPROFLOXACIN HCL 250 MG PO TABS: ORAL_TABLET | ORAL | 0 refills | Status: DC

## 2019-02-19 LAB — URINALYSIS, ROUTINE W REFLEX MICROSCOPIC
BACTERIA UA: NONE SEEN /HPF
Bilirubin Urine: NEGATIVE
Glucose, UA: NEGATIVE
Hgb urine dipstick: NEGATIVE
Hyaline Cast: NONE SEEN /LPF
Ketones, ur: NEGATIVE
Nitrite: NEGATIVE
Protein, ur: NEGATIVE
Specific Gravity, Urine: 1.02 (ref 1.001–1.03)
pH: >=8.5 — AB (ref 5.0–8.0)

## 2019-02-19 LAB — URINE CULTURE
MICRO NUMBER: 227867
SPECIMEN QUALITY:: ADEQUATE

## 2019-02-20 ENCOUNTER — Other Ambulatory Visit: Payer: Self-pay | Admitting: Internal Medicine

## 2019-02-20 MED ORDER — FLUCONAZOLE 150 MG PO TABS: ORAL_TABLET | ORAL | 0 refills | Status: DC

## 2019-02-23 ENCOUNTER — Telehealth (INDEPENDENT_AMBULATORY_CARE_PROVIDER_SITE_OTHER): Payer: Self-pay | Admitting: Orthopedic Surgery

## 2019-02-23 NOTE — Telephone Encounter (Signed)
Patient left a message today stating that she has been using a continuous motion machine and has now reached 90%.  She is wanting to know if she has to call the company to come pick up the machine.  CB#[phone].  Thank you.

## 2019-02-23 NOTE — Telephone Encounter (Signed)
How long do you want her to continue with CPM?

## 2019-02-23 NOTE — Discharge Summary (Signed)
Physician Discharge Summary  Patient ID: Tiffany [name] MRN: [id number] DOB/AGE: 71-Sep-1947 71 y.o.  Admit date: 02/10/2019 Discharge date: 02/11/2019  Admission Diagnoses:  Active Problems:   Shoulder arthritis   Discharge Diagnoses:  Same  Surgeries: Procedure(s): RIGHT REVERSE SHOULDER ARTHROPLASTY on 02/10/2019   Consultants:   Discharged Condition: Stable  Hospital Course: Tiffany [name] is an 73 y.o. female who was admitted 02/10/2019 with a chief complaint of right shoulder arthritis, and found to have a diagnosis of severe symptomatic right shoulder arthritis.  They were brought to the operating room on 02/10/2019 and underwent the above named procedures.  Patient tolerated the procedure well without immediate complications.  She was having good pain control on postop day #1 from the block.  She is discharged home in good condition and will utilize home health PT as well as shoulder CPM machine during the postoperative period.  I will see her back in approximately 10 days for clinical recheck.  Discharged home on pain medicine and muscle relaxer.  Antibiotics given:  Anti-infectives (From admission, onward)   Start     Dose/Rate Route Frequency Ordered Stop   02/10/19 2000  ceFAZolin (ANCEF) IVPB 2g/100 mL premix     2 g 200 mL/hr over 30 Minutes Intravenous Every 6 hours 02/10/19 1847 02/11/19 0944   02/10/19 1444  vancomycin (VANCOCIN) powder  Status:  Discontinued       As needed 02/10/19 1444 02/10/19 1738   02/10/19 1116  ceFAZolin (ANCEF) 2-4 GM/100ML-% IVPB    Note to Pharmacy:  Cordelia Pen   : cabinet override      02/10/19 1116 02/10/19 1415   02/10/19 1115  ceFAZolin (ANCEF) IVPB 2g/100 mL premix     2 g 200 mL/hr over 30 Minutes Intravenous On call to O.R. 02/10/19 1111 02/10/19 1415    .  Recent vital signs:  Vitals:   02/11/19 0804 02/11/19 1129  BP: 136/67 (!) 101/58  Pulse: 87 75  Resp: (!) 22   Temp: 98 F (36.7 C)   SpO2: 94% 96%    Recent  laboratory studies:  Results for orders placed or performed during the hospital encounter of 01/26/19  Surgical pcr screen  Result Value Ref Range   MRSA, PCR NEGATIVE NEGATIVE   Staphylococcus aureus NEGATIVE NEGATIVE  Basic metabolic panel  Result Value Ref Range   Sodium 137 135 - 145 mmol/L   Potassium 4.1 3.5 - 5.1 mmol/L   Chloride 99 98 - 111 mmol/L   CO2 26 22 - 32 mmol/L   Glucose, Bld 102 (H) 70 - 99 mg/dL   BUN 15 8 - 23 mg/dL   Creatinine, Ser 1.16 (H) 0.44 - 1.00 mg/dL   Calcium 9.1 8.9 - 10.3 mg/dL   GFR calc non Af Amer 48 (L) >60 mL/min   GFR calc Af Amer 55 (L) >60 mL/min   Anion gap 12 5 - 15  CBC  Result Value Ref Range   WBC 9.9 4.0 - 10.5 K/uL   RBC 4.36 3.87 - 5.11 MIL/uL   Hemoglobin 13.3 12.0 - 15.0 g/dL   HCT 42.9 36.0 - 46.0 %   MCV 98.4 80.0 - 100.0 fL   MCH 30.5 26.0 - 34.0 pg   MCHC 31.0 30.0 - 36.0 g/dL   RDW 14.1 11.5 - 15.5 %   Platelets 293 150 - 400 K/uL   nRBC 0.0 0.0 - 0.2 %  Urinalysis, Routine w reflex microscopic  Result Value Ref Range  Color, Urine YELLOW YELLOW   APPearance CLOUDY (A) CLEAR   Specific Gravity, Urine 1.017 1.005 - 1.030   pH 6.0 5.0 - 8.0   Glucose, UA NEGATIVE NEGATIVE mg/dL   Hgb urine dipstick NEGATIVE NEGATIVE   Bilirubin Urine NEGATIVE NEGATIVE   Ketones, ur NEGATIVE NEGATIVE mg/dL   Protein, ur NEGATIVE NEGATIVE mg/dL   Nitrite NEGATIVE NEGATIVE   Leukocytes, UA TRACE (A) NEGATIVE   RBC / HPF 0-5 0 - 5 RBC/hpf   WBC, UA 0-5 0 - 5 WBC/hpf   Bacteria, UA RARE (A) NONE SEEN   Squamous Epithelial / LPF 0-5 0 - 5    Discharge Medications:   Allergies as of 02/11/2019      Reactions   Other Other (See Comments)   Stool softeners caused stomach bloating, nausea, vomiting bleeding ulcer and blood transfusion      Medication List    STOP taking these medications   gabapentin 600 MG tablet Commonly known as:  NEURONTIN   HYDROcodone-acetaminophen 5-325 MG tablet Commonly known as:   NORCO/VICODIN Replaced by:  HYDROcodone-acetaminophen 10-325 MG tablet     TAKE these medications   atenolol 100 MG tablet Commonly known as:  TENORMIN TAKE 1 TABLET BY MOUTH DAILY FOR BLOOD PRESSURE What changed:    how much to take  additional instructions   chlorpheniramine 4 MG tablet Commonly known as:  CHLOR-TRIMETON Take 4 mg by mouth 2 (two) times daily as needed for allergies.   enalapril 20 MG tablet Commonly known as:  VASOTEC Take 1 tablet daily for BP   famotidine 40 MG tablet Commonly known as:  PEPCID Take 1 tablet (40 mg total) by mouth every evening.   fluticasone 50 MCG/ACT nasal spray Commonly known as:  FLONASE Use 1 to 2 sprays each nares 1 to 2 x /day   furosemide 40 MG tablet Commonly known as:  LASIX Take 1 tablet daily for BP & Fluid Retention   HYDROcodone-acetaminophen 10-325 MG tablet Commonly known as:  NORCO Take 1 tablet by mouth every 6 (six) hours as needed. Replaces:  HYDROcodone-acetaminophen 5-325 MG tablet   Magnesium 250 MG Tabs Take 250 mg by mouth at bedtime.   meloxicam 15 MG tablet Commonly known as:  MOBIC Take 1/2 to 1 tablet daily with food for Pain & Inflammation   methocarbamol 500 MG tablet Commonly known as:  ROBAXIN Take 1 tablet (500 mg total) by mouth every 6 (six) hours as needed for muscle spasms.   MULTIVITAMIN PO Take 1 tablet by mouth at bedtime.   nystatin powder Commonly known as:  nystatin Apply topically 4 (four) times daily. What changed:    how much to take  when to take this  reasons to take this   rosuvastatin 20 MG tablet Commonly known as:  CRESTOR Take 1 tablet daily for Cholesterol   Vitamin D3 125 MCG (5000 UT) Caps Take 5,000 Units by mouth 2 (two) times daily.       Diagnostic Studies: Dg Shoulder Right Port  Result Date: 02/10/2019 CLINICAL DATA:  Status post right shoulder replacement EXAM: PORTABLE RIGHT SHOULDER COMPARISON:  None. FINDINGS: Right shoulder replacement  is noted in satisfactory position. Mild degenerative changes of the acromioclavicular joint are seen. Underlying bony thorax is within normal limits. IMPRESSION: Status post right shoulder replacement.  No acute abnormality noted. Electronically Signed   By: Inez Catalina M.D.   On: 02/10/2019 20:05    Disposition:   Discharge Instructions  Call MD / Call 911   Complete by:  As directed    If you experience chest pain or shortness of breath, CALL 911 and be transported to the hospital emergency room.  If you develope a fever above 101 F, pus (white drainage) or increased drainage or redness at the wound, or calf pain, call your surgeon's office.   Constipation Prevention   Complete by:  As directed    Drink plenty of fluids.  Prune juice may be helpful.  You may use a stool softener, such as Colace (over the counter) 100 mg twice a day.  Use MiraLax (over the counter) for constipation as needed.   Diet - low sodium heart healthy   Complete by:  As directed    Discharge instructions   Complete by:  As directed    cpm 1 hour 3 times a day Ok to shower dressing waterproof No lifting with right arm   Increase activity slowly as tolerated   Complete by:  As directed          Signed: Anderson Malta 02/23/2019, 1:24 PM

## 2019-02-23 NOTE — Telephone Encounter (Signed)
I would have her continue using the CPM for a full 2 weeks after surgery and then she can discontinue the machine

## 2019-02-23 NOTE — Telephone Encounter (Signed)
IC patient and advised.  

## 2019-02-25 ENCOUNTER — Ambulatory Visit (INDEPENDENT_AMBULATORY_CARE_PROVIDER_SITE_OTHER): Payer: Medicare Other | Admitting: Orthopedic Surgery

## 2019-02-25 ENCOUNTER — Ambulatory Visit (INDEPENDENT_AMBULATORY_CARE_PROVIDER_SITE_OTHER): Payer: Medicare Other

## 2019-02-25 ENCOUNTER — Encounter (INDEPENDENT_AMBULATORY_CARE_PROVIDER_SITE_OTHER): Payer: Self-pay | Admitting: Orthopedic Surgery

## 2019-02-25 DIAGNOSIS — M12811 Other specific arthropathies, not elsewhere classified, right shoulder: Secondary | ICD-10-CM

## 2019-02-25 MED ORDER — METHOCARBAMOL 500 MG PO TABS: ORAL_TABLET | ORAL | 0 refills | Status: DC

## 2019-02-25 MED ORDER — OXYCODONE HCL 5 MG PO TABS: ORAL_TABLET | ORAL | 0 refills | Status: DC

## 2019-02-25 NOTE — Progress Notes (Signed)
Post-Op Visit Note   Patient: Tiffany [name]           Date of Birth: 16-Oct-1946           MRN: [id number] Visit Date: 02/25/2019 PCP: Unk Pinto, MD   Assessment & Plan:  Chief Complaint:  Chief Complaint  Patient presents with  . Right Shoulder - Routine Post Op   Visit Diagnoses:  1. Rotator cuff arthropathy of right shoulder     Plan: [name] is now 2 weeks out right reverse shoulder replacement.  On examination she has passive forward flexion and abduction to about 90 degrees.  Deltoid is functional.  Incision is intact.  I will start her in physical therapy and have her hold off on the CPM machine.  DC sling.  Refill oxycodone and Robaxin.  Radiographs look good.  Follow-up in 4 weeks for clinical recheck.  Pain relief is good at this point as well.  Follow-Up Instructions: Return in about 4 weeks (around 03/25/2019).   Orders:  Orders Placed This Encounter  Procedures  . XR Shoulder Right  . Ambulatory referral to Physical Therapy   Meds ordered this encounter  Medications  . oxyCODONE (ROXICODONE) 5 MG immediate release tablet    Sig: 1 po q 6-8 hrs prn pain    Dispense:  40 tablet    Refill:  0  . methocarbamol (ROBAXIN) 500 MG tablet    Sig: 1 po q 8 hrs prn    Dispense:  30 tablet    Refill:  0    Imaging: Xr Shoulder Right  Result Date: 02/25/2019 AP axillary right shoulder reviewed.  Reverse shoulder prosthesis in good position alignment with no complicating features.  Shoulder is located.   PMFS History: Patient Active Problem List   Diagnosis Date Noted  . Shoulder arthritis 02/10/2019  . Other chronic pain 01/24/2019  . Sensory neuropathy 01/24/2019  . Prediabetes 01/24/2019  . Senile purpura ([place]) 10/18/2018  . Unilateral primary osteoarthritis, left knee 07/21/2018  . Peripheral neuropathy ([place]) 08/27/2016  . GERD (gastroesophageal reflux disease) 03/27/2016  . Subclinical hyperthyroidism 08/18/2015  . Gastric ulcer due to  nonsteroidal antiinflammatory drug (NSAID) therapy   . Lung cancer ([place]) 05/09/2015  . Peripheral edema 05/01/2015  . Abnormal glucose 07/18/2014  . Vitamin D deficiency 07/18/2014  . Medication management 07/18/2014  . Hyperlipidemia, mixed 04/04/2014  . Charcot's Right foot 06/15/2013  . Hypertension    Past Medical History:  Diagnosis Date  . Anemia   . Arthritis   . Cancer ([place])    lung cancer 2016  . Gait difficulty    problems with balance  . High blood pressure   . History of blood transfusion    05-14-15  . Hyperlipidemia   . Hyperthyroidism mild  . Neuropathy   . Obesity (BMI 35.0-39.9 without comorbidity)   . Pre-diabetes   . 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 Vitamin D deficiency   . Wears glasses   . Wears partial dentures     Family History  Problem Relation Age of Onset  . Heart attack Father   . Hypertension Mother   . Breast cancer Maternal Aunt   . Colon cancer Paternal Grandmother     Past Surgical History:  Procedure Laterality Date  . CERVICAL DISC ARTHROPLASTY    . CRYO INTERCOSTAL NERVE BLOCK Left 05/09/2015   Procedure: CRYO INTERCOSTAL NERVE BLOCK;  Surgeon: [name], MD;  Location: [facility];  Service:  Thoracic;  Laterality: Left;  . ESOPHAGOGASTRODUODENOSCOPY N/A 05/16/2015   Procedure: ESOPHAGOGASTRODUODENOSCOPY (EGD);  Surgeon: Irene Shipper, MD;  Location: [facility] ENDOSCOPY;  Service: Endoscopy;  Laterality: N/A;  possible ablation of a bleeding lesion  . knee replaced     right knee x 2  . LOBECTOMY Left 05/09/2015   Procedure: LEFT LUNG UPPER LOBECTOMY;  Surgeon: [name], MD;  Location: [facility];  Service: Thoracic;  Laterality: Left;  . LYMPH NODE DISSECTION Left 05/09/2015   Procedure: LYMPH NODE DISSECTION;  Surgeon: [name], MD;  Location: [facility];  Service: Thoracic;  Laterality: Left;  Marland Kitchen MULTIPLE TOOTH EXTRACTIONS    . REVERSE SHOULDER ARTHROPLASTY Right 02/10/2019   Procedure: RIGHT REVERSE  SHOULDER ARTHROPLASTY;  Surgeon: Meredith Pel, MD;  Location: [facility];  Service: Orthopedics;  Laterality: Right;  . ROTATOR CUFF REPAIR Right   . SPINAL FUSION    . TONSILLECTOMY  age 89  . TOTAL HIP ARTHROPLASTY Left 07/13/2015   Procedure: LEFT TOTAL HIP ARTHROPLASTY ANTERIOR APPROACH;  Surgeon: Mcarthur Rossetti, MD;  Location: [facility];  Service: Orthopedics;  Laterality: Left;  Marland Kitchen VAGINAL HYSTERECTOMY    . VIDEO ASSISTED THORACOSCOPY (VATS)/WEDGE RESECTION Left 05/09/2015   Procedure: LEFT VIDEO ASSISTED THORACOSCOPY (VATS) WITH LEFT LUNG UPPER LOBE WEDGE RESECTION;  Surgeon: [name], MD;  Location: [facility];  Service: Thoracic;  Laterality: Left;  Marland Kitchen VIDEO BRONCHOSCOPY WITH ENDOBRONCHIAL NAVIGATION N/A 04/18/2015   Procedure: VIDEO BRONCHOSCOPY WITH ENDOBRONCHIAL NAVIGATION;  Surgeon: Collene Gobble, MD;  Location: [facility];  Service: Thoracic;  Laterality: N/A;  . VIDEO BRONCHOSCOPY WITH ENDOBRONCHIAL ULTRASOUND N/A 04/18/2015   Procedure: VIDEO BRONCHOSCOPY WITH ENDOBRONCHIAL ULTRASOUND;  Surgeon: Collene Gobble, MD;  Location: [facility];  Service: Thoracic;  Laterality: N/A;   Social History   Occupational History  . Occupation: retired  Tobacco Use  . Smoking status: Former Smoker    Packs/day: 1.00    Years: 30.00    Pack years: 30.00    Types: Cigarettes    Last attempt to quit: 05/08/2015    Years since quitting: 3.8  . Smokeless tobacco: Never Used  Substance and Sexual Activity  . Alcohol use: Yes    Alcohol/week: 2.0 standard drinks    Types: 2 Glasses of wine per week    Comment: 2 glasses wine  daily  . Drug use: No  . Sexual activity: Not on file

## 2019-02-26 ENCOUNTER — Other Ambulatory Visit: Payer: Self-pay | Admitting: Internal Medicine

## 2019-03-01 ENCOUNTER — Ambulatory Visit: Payer: Medicare Other | Attending: Orthopedic Surgery | Admitting: Physical Therapy

## 2019-03-01 ENCOUNTER — Other Ambulatory Visit: Payer: Self-pay

## 2019-03-01 ENCOUNTER — Encounter: Payer: Self-pay | Admitting: Physical Therapy

## 2019-03-01 DIAGNOSIS — M545 Low back pain: Secondary | ICD-10-CM | POA: Insufficient documentation

## 2019-03-01 DIAGNOSIS — M25511 Pain in right shoulder: Secondary | ICD-10-CM | POA: Insufficient documentation

## 2019-03-01 DIAGNOSIS — M25611 Stiffness of right shoulder, not elsewhere classified: Secondary | ICD-10-CM | POA: Insufficient documentation

## 2019-03-01 DIAGNOSIS — R2689 Other abnormalities of gait and mobility: Secondary | ICD-10-CM | POA: Diagnosis not present

## 2019-03-01 DIAGNOSIS — M6281 Muscle weakness (generalized): Secondary | ICD-10-CM | POA: Diagnosis not present

## 2019-03-01 DIAGNOSIS — G8929 Other chronic pain: Secondary | ICD-10-CM | POA: Diagnosis not present

## 2019-03-02 ENCOUNTER — Encounter: Payer: Self-pay | Admitting: Physical Therapy

## 2019-03-02 NOTE — Therapy (Signed)
[place], [place], [address] Phone: [phone]   Fax:  [phone]  Physical Therapy Evaluation  Patient Details  Name: Tiffany [name] MRN: [id number] Date of Birth: [birth year] Referring Provider (PT): Dr Meredith Pel    Encounter Date: 03/01/2019  PT End of Session - 03/01/19 1508    Visit Number  1    Number of Visits  16    Date for PT Re-Evaluation  04/26/19    PT Start Time  1500    PT Stop Time  1543    PT Time Calculation (min)  43 min    Activity Tolerance  Patient tolerated treatment well    Behavior During Therapy  [facility] for tasks assessed/performed       Past Medical History:  Diagnosis Date  . Anemia   . Arthritis   . Cancer ([place])    lung cancer 2016  . Gait difficulty    problems with balance  . High blood pressure   . History of blood transfusion    05-14-15  . Hyperlipidemia   . Hyperthyroidism mild  . Neuropathy   . Obesity (BMI 35.0-39.9 without comorbidity)   . Pre-diabetes   . 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 Vitamin D deficiency   . Wears glasses   . Wears partial dentures     Past Surgical History:  Procedure Laterality Date  . CERVICAL DISC ARTHROPLASTY    . CRYO INTERCOSTAL NERVE BLOCK Left 05/09/2015   Procedure: CRYO INTERCOSTAL NERVE BLOCK;  Surgeon: [name], MD;  Location: [place];  Service: Thoracic;  Laterality: Left;  . ESOPHAGOGASTRODUODENOSCOPY N/A 05/16/2015   Procedure: ESOPHAGOGASTRODUODENOSCOPY (EGD);  Surgeon: Irene Shipper, MD;  Location: [facility] ENDOSCOPY;  Service: Endoscopy;  Laterality: N/A;  possible ablation of a bleeding lesion  . knee replaced     right knee x 2  . LOBECTOMY Left 05/09/2015   Procedure: LEFT LUNG UPPER LOBECTOMY;  Surgeon: [name], MD;  Location: [place];  Service: Thoracic;  Laterality: Left;  . LYMPH NODE DISSECTION Left 05/09/2015   Procedure: LYMPH NODE DISSECTION;  Surgeon:  [name], MD;  Location: [place];  Service: Thoracic;  Laterality: Left;  Marland Kitchen MULTIPLE TOOTH EXTRACTIONS    . REVERSE SHOULDER ARTHROPLASTY Right 02/10/2019   Procedure: RIGHT REVERSE SHOULDER ARTHROPLASTY;  Surgeon: Meredith Pel, MD;  Location: [place];  Service: Orthopedics;  Laterality: Right;  . ROTATOR CUFF REPAIR Right   . SPINAL FUSION    . TONSILLECTOMY  age 73  . TOTAL HIP ARTHROPLASTY Left 07/13/2015   Procedure: LEFT TOTAL HIP ARTHROPLASTY ANTERIOR APPROACH;  Surgeon: Mcarthur Rossetti, MD;  Location: [facility];  Service: Orthopedics;  Laterality: Left;  Marland Kitchen VAGINAL HYSTERECTOMY    . VIDEO ASSISTED THORACOSCOPY (VATS)/WEDGE RESECTION Left 05/09/2015   Procedure: LEFT VIDEO ASSISTED THORACOSCOPY (VATS) WITH LEFT LUNG UPPER LOBE WEDGE RESECTION;  Surgeon: [name], MD;  Location: [place];  Service: Thoracic;  Laterality: Left;  Marland Kitchen VIDEO BRONCHOSCOPY WITH ENDOBRONCHIAL NAVIGATION N/A 04/18/2015   Procedure: VIDEO BRONCHOSCOPY WITH ENDOBRONCHIAL NAVIGATION;  Surgeon: Collene Gobble, MD;  Location: [place];  Service: Thoracic;  Laterality: N/A;  . VIDEO BRONCHOSCOPY WITH ENDOBRONCHIAL ULTRASOUND N/A 04/18/2015   Procedure: VIDEO BRONCHOSCOPY WITH ENDOBRONCHIAL ULTRASOUND;  Surgeon: Collene Gobble, MD;  Location: [place];  Service: Thoracic;  Laterality: N/A;    There were no vitals filed for this visit.  Subjective Assessment - 03/01/19 1502    Subjective  Patient is a 73 year old female S/P right reverse total shoulder arthroplasty on 02/09/2019. She reports the pain is under control at this point. She is having some difficutly transfering without the use of the right arm.  She has decreased balance. She also has neuropathy which effects her balance     Limitations  House hold activities    Diagnostic tests  nothing post     Patient Stated Goals  be able to transfer better; better use of her arm     Currently in Pain?  Yes    Pain Score  5     Pain Location  Shoulder     Pain Orientation  Right    Pain Descriptors / Indicators  Aching    Pain Onset  More than a month ago    Pain Frequency  Constant    Aggravating Factors   use of the shoulder     Pain Relieving Factors  pain medication    Effect of Pain on Daily Activities  unable to use her dominant arm     Multiple Pain Sites  No         [facility] PT Assessment - 03/02/19 0001      Assessment   Medical Diagnosis  Right Reverse Total Shoulder Arthroplasty     Referring Provider (PT)  Dr Meredith Pel     Onset Date/Surgical Date  --   02/10/2019    Hand Dominance  Right    Next MD Visit  03/28/19-20    Prior Therapy  None       Precautions   Precautions  Shoulder    Type of Shoulder Precautions  No weight bearing on shoulder/ Will follow [facility] protocol in absesnce of prtocol from the surgeon.       Restrictions   Weight Bearing Restrictions  No      Balance Screen   Has the patient fallen in the past 6 months  No    Has the patient had a decrease in activity level because of a fear of falling?   No    Is the patient reluctant to leave their home because of a fear of falling?   No      Home Environment   Additional Comments  Nothing significant       Prior Function   Level of Independence  Independent    Vocation  Retired    Leisure  Working in the yard       Cognition   Overall Cognitive Status  Within Functional Limits for tasks assessed    Attention  Focused    Focused Attention  Appears intact    Memory  Appears intact    Awareness  Appears intact    Problem Solving  Appears intact      Observation/Other Assessments   Observations  Patient becam eshort of breath walking in from the loby     Skin Integrity  well heald surgical scar    Focus on Therapeutic Outcomes (FOTO)   64 % limiation       Sensation   Additional Comments  numbness in bilateral lower extremitys       Coordination   Gross Motor Movements are Fluid and Coordinated  Yes    Fine Motor  Movements are Fluid and Coordinated  Yes      Posture/Postural Control   Posture Comments  forward head; increased thoracic kyphosis  AROM   Overall AROM Comments  Not measured 2nd to recent surgery       PROM   Right Shoulder Flexion  62 Degrees      Strength   Overall Strength Comments  Not tested 2nd to protocol       Palpation   Palpation comment  No unexpected tenderness to palpation       Bed Mobility   Bed Mobility  Supine to Sit    Supine to Sit  Moderate Assistance - Patient 50-74%      Transfers   Comments  modrate assist of her husband to transfer sit to stand      Ambulation/Gait   Gait Comments  requires min a of husband for balance                 Objective measurements completed on examination: See above findings.      [place] Adult PT Treatment/Exercise - 03/02/19 0001      Shoulder Exercises: Seated   Other Seated Exercises  wrist flex/ext x10; elbow flex/ext x10;     Other Seated Exercises  scap retraction 2x10       Shoulder Exercises: Standing   Other Standing Exercises  pendulums forward and back; side to side with guarding from her husband              PT Education - 03/02/19 0809    Education Details  reviewed HEP, symptom management     Person(s) Educated  Patient    Methods  Explanation;Demonstration;Verbal cues;Handout;Tactile cues    Comprehension  Verbalized understanding;Returned demonstration;Verbal cues required;Tactile cues required       PT Short Term Goals - 03/01/19 1554      PT SHORT TERM GOAL #1   Title  Patient will transfer sit to stand with SBA     Time  4    Period  Weeks    Status  New    Target Date  03/29/19      PT SHORT TERM GOAL #2   Title  Patient will increase gross right shoulder strength to 3+/5     Time  4    Period  Weeks    Status  New    Target Date  03/29/19      PT SHORT TERM GOAL #3   Title  Patient will increase shoulder flexion PROM and ER by 20 degrees     Baseline  ER  -5 flexion 60 degrees     Time  4    Period  Weeks    Status  New        PT Long Term Goals - 03/01/19 1556      PT LONG TERM GOAL #1   Title  Patient will reach behind her head without pain in order to do her hair    Time  8    Period  Weeks    Status  New    Target Date  04/26/19      PT LONG TERM GOAL #2   Title  Patient will reach to an overhead cabinet without pain in order to perfrom AD:Ls     Time  8    Period  Weeks    Status  New      PT LONG TERM GOAL #3   Title  Patient will demonstrate a 44% limitation on FOTO              Plan - 03/01/19 1638  Clinical Impression Statement  Patient is a 73 year old female S/P Right reverse TSA on 02/10/2019. She presetns with epected limitations in funtion, strength, and motion. Her pain is well controled. She requires assist for transfers at this time and became short of breath walking from the lobby to the clinic. She would benefit from skilled therapy to improve strength, motion, and function of her right shoulder. She would also benefit from transfer training.     Personal Factors and Comorbidities  Comorbidity 3+    Comorbidities  partial lung removal, deconditioning, neuropathy, decreased baseline balance;     Examination-Activity Limitations  Bathing;Hygiene/Grooming;Stand;Reach Overhead;Caring for Others;Carry;Dressing    Examination-Participation Restrictions  Shop;Cleaning;Personal Finances;Meal Prep;Yard Work    Clinical Decision Making  Moderate    Rehab Potential  Good    PT Frequency  2x / week    PT Duration  8 weeks    PT Treatment/Interventions  ADLs/Self Care Home Management;Cryotherapy;Electrical Stimulation;Iontophoresis 4mg /ml Dexamethasone;Ultrasound    PT Next Visit Plan  Follow [facility] protocol; PROM ER 0-30; PROM shoulder flexion 90-120; begin shouldr isometrics when able. Per protovol AAROM for flexion is allowed; consider table slides; continue scapular stabilization; modalities PRN      Consulted and Agree with Plan of Care  Patient       Patient will benefit from skilled therapeutic intervention in order to improve the following deficits and impairments:  Pain  Visit Diagnosis: Chronic right shoulder pain  Stiffness of right shoulder, not elsewhere classified  Muscle weakness (generalized)  Other abnormalities of gait and mobility     Problem List Patient Active Problem List   Diagnosis Date Noted  . Shoulder arthritis 02/10/2019  . Other chronic pain 01/24/2019  . Sensory neuropathy 01/24/2019  . Prediabetes 01/24/2019  . Senile purpura ([place]) 10/18/2018  . Unilateral primary osteoarthritis, left knee 07/21/2018  . Peripheral neuropathy ([place]) 08/27/2016  . GERD (gastroesophageal reflux disease) 03/27/2016  . Subclinical hyperthyroidism 08/18/2015  . Gastric ulcer due to nonsteroidal antiinflammatory drug (NSAID) therapy   . Lung cancer ([place]) 05/09/2015  . Peripheral edema 05/01/2015  . Abnormal glucose 07/18/2014  . Vitamin D deficiency 07/18/2014  . Medication management 07/18/2014  . Hyperlipidemia, mixed 04/04/2014  . Charcot's Right foot 06/15/2013  . Hypertension     Carney Living PT DPT  03/02/2019, 12:52 PM  [facility] [address] [place], [place], [address] Phone: [phone]   Fax:  [phone]  Name: CATHELEEN LANGHORNE MRN: [id number] Date of Birth: 29-Sep-1946

## 2019-03-07 ENCOUNTER — Ambulatory Visit: Payer: Medicare Other | Admitting: Physical Therapy

## 2019-03-07 ENCOUNTER — Encounter: Payer: Self-pay | Admitting: Physical Therapy

## 2019-03-07 DIAGNOSIS — G8929 Other chronic pain: Secondary | ICD-10-CM | POA: Diagnosis not present

## 2019-03-07 DIAGNOSIS — M6281 Muscle weakness (generalized): Secondary | ICD-10-CM

## 2019-03-07 DIAGNOSIS — M25511 Pain in right shoulder: Secondary | ICD-10-CM | POA: Diagnosis not present

## 2019-03-07 DIAGNOSIS — M25611 Stiffness of right shoulder, not elsewhere classified: Secondary | ICD-10-CM | POA: Diagnosis not present

## 2019-03-07 DIAGNOSIS — R2689 Other abnormalities of gait and mobility: Secondary | ICD-10-CM | POA: Diagnosis not present

## 2019-03-07 DIAGNOSIS — M545 Low back pain: Secondary | ICD-10-CM | POA: Diagnosis not present

## 2019-03-07 NOTE — Therapy (Signed)
[place], [place], [address] Phone: [phone]   Fax:  [phone]  Physical Therapy Treatment  Patient Details  Name: Tiffany [name] MRN: [id number] Date of Birth: 01/26/1946 Referring Provider (PT): Dr Meredith Pel    Encounter Date: 03/07/2019  PT End of Session - 03/07/19 1111    Visit Number  2    Number of Visits  16    Date for PT Re-Evaluation  04/26/19    PT Start Time  1103    PT Stop Time  1156    PT Time Calculation (min)  53 min    Activity Tolerance  Patient tolerated treatment well    Behavior During Therapy  [facility] for tasks assessed/performed       Past Medical History:  Diagnosis Date  . Anemia   . Arthritis   . Cancer ([place])    lung cancer 2016  . Gait difficulty    problems with balance  . High blood pressure   . History of blood transfusion    05-14-15  . Hyperlipidemia   . Hyperthyroidism mild  . Neuropathy   . Obesity (BMI 35.0-39.9 without comorbidity)   . Pre-diabetes   . 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 Vitamin D deficiency   . Wears glasses   . Wears partial dentures     Past Surgical History:  Procedure Laterality Date  . CERVICAL DISC ARTHROPLASTY    . CRYO INTERCOSTAL NERVE BLOCK Left 05/09/2015   Procedure: CRYO INTERCOSTAL NERVE BLOCK;  Surgeon: [name], MD;  Location: [place];  Service: Thoracic;  Laterality: Left;  . ESOPHAGOGASTRODUODENOSCOPY N/A 05/16/2015   Procedure: ESOPHAGOGASTRODUODENOSCOPY (EGD);  Surgeon: Irene Shipper, MD;  Location: [facility] ENDOSCOPY;  Service: Endoscopy;  Laterality: N/A;  possible ablation of a bleeding lesion  . knee replaced     right knee x 2  . LOBECTOMY Left 05/09/2015   Procedure: LEFT LUNG UPPER LOBECTOMY;  Surgeon: [name], MD;  Location: [place];  Service: Thoracic;  Laterality: Left;  . LYMPH NODE DISSECTION Left 05/09/2015   Procedure: LYMPH NODE DISSECTION;  Surgeon: [name], MD;  Location: [place];  Service: Thoracic;  Laterality: Left;  Marland Kitchen MULTIPLE TOOTH EXTRACTIONS    . REVERSE SHOULDER ARTHROPLASTY Right 02/10/2019   Procedure: RIGHT REVERSE SHOULDER ARTHROPLASTY;  Surgeon: Meredith Pel, MD;  Location: [place];  Service: Orthopedics;  Laterality: Right;  . ROTATOR CUFF REPAIR Right   . SPINAL FUSION    . TONSILLECTOMY  age 73  . TOTAL HIP ARTHROPLASTY Left 07/13/2015   Procedure: LEFT TOTAL HIP ARTHROPLASTY ANTERIOR APPROACH;  Surgeon: Mcarthur Rossetti, MD;  Location: [facility];  Service: Orthopedics;  Laterality: Left;  Marland Kitchen VAGINAL HYSTERECTOMY    . VIDEO ASSISTED THORACOSCOPY (VATS)/WEDGE RESECTION Left 05/09/2015   Procedure: LEFT VIDEO ASSISTED THORACOSCOPY (VATS) WITH LEFT LUNG UPPER LOBE WEDGE RESECTION;  Surgeon: [name], MD;  Location: [place];  Service: Thoracic;  Laterality: Left;  Marland Kitchen VIDEO BRONCHOSCOPY WITH ENDOBRONCHIAL NAVIGATION N/A 04/18/2015   Procedure: VIDEO BRONCHOSCOPY WITH ENDOBRONCHIAL NAVIGATION;  Surgeon: Collene Gobble, MD;  Location: [place];  Service: Thoracic;  Laterality: N/A;  . VIDEO BRONCHOSCOPY WITH ENDOBRONCHIAL ULTRASOUND N/A 04/18/2015   Procedure: VIDEO BRONCHOSCOPY WITH ENDOBRONCHIAL ULTRASOUND;  Surgeon: Collene Gobble, MD;  Location: [place];  Service: Thoracic;  Laterality: N/A;    There were no vitals filed for this visit.  Subjective Assessment - 03/07/19 1109    Subjective  Pain about 5/10 at rest .  Has not done any exercise since the CPM was taken away.  Needs min A to walk, HHA.      Currently in Pain?  Yes    Pain Score  5     Pain Location  Shoulder    Pain Orientation  Right    Pain Descriptors / Indicators  Aching;Sore    Pain Onset  More than a month ago    Pain Frequency  Constant    Aggravating Factors   using it     Pain Relieving Factors  pain meds, rest     Multiple Pain Sites  No          [facility] Adult PT Treatment/Exercise - 03/07/19 0001      Elbow Exercises   Elbow  Flexion  AROM;Right;15 reps;Supine    Other elbow exercises  wrist flex, ext x 10       Shoulder Exercises: Supine   External Rotation  AAROM;Right;12 reps    External Rotation Weight (lbs)  Wand     Flexion  AAROM;Both;10 reps    Shoulder Flexion Weight (lbs)  Wand and PT asst     Other Supine Exercises  retraction palms up x 10     Other Supine Exercises  chest press with Asst.  Wand x 10       Shoulder Exercises: Seated   Retraction  Strengthening;Both;12 reps    Retraction Weight (lbs)  heavy cues     Other Seated Exercises  scap retract see above       Shoulder Exercises: ROM/Strengthening   Other ROM/Strengthening Exercises  seated table slides in flexion and scaption x 15 with UE ranger as tolerated, needs L UE to stabilize      Shoulder Exercises: Isometric Strengthening   ABduction Limitations  deltoid x 15 reps PT assist gentle       Modalities   Modalities  Cryotherapy      Cryotherapy   Number Minutes Cryotherapy  8 Minutes    Cryotherapy Location  Shoulder    Type of Cryotherapy  Ice pack      Manual Therapy   Manual Therapy  Passive ROM    Manual therapy comments  light distraction, oscillation to Rt UE in various planes offlexion < 45 deg , 0 Abduction     Passive ROM  flexion , ER to tolerance              PT Education - 03/07/19 1245    Education Details  HEP technique, ice application     Person(s) Educated  Patient    Methods  Explanation;Handout    Comprehension  Verbalized understanding;Returned demonstration;Verbal cues required       PT Short Term Goals - 03/07/19 1245      PT SHORT TERM GOAL #1   Title  Patient will transfer sit to stand with SBA     Baseline  min A     Status  On-going      PT SHORT TERM GOAL #2   Title  Patient will increase gross right shoulder strength to 3+/5     Status  On-going      PT SHORT TERM GOAL #3   Title  Patient will increase shoulder flexion PROM and ER by 20 degrees     Baseline  ER about 25 deg  and flexion to 95 deg     Status  Achieved      PT SHORT TERM GOAL #4   Title  Pt will tolerate standing for LE exercise for 8 min or more with min increase in back pain.         PT Long Term Goals - 03/07/19 1246      PT LONG TERM GOAL #1   Title  Patient will reach behind her head without pain in order to do her hair    Status  On-going      PT LONG TERM GOAL #2   Title  Patient will reach to an overhead cabinet without pain in order to perfrom AD:Ls     Status  On-going      PT LONG TERM GOAL #3   Title  Patient will demonstrate a 44% limitation on FOTO     Status  On-going            Plan - 03/07/19 1059    Clinical Impression Statement  Pt showing improved PROM tolerance today, began light AAROM and pendulum type exercises to reduce pain and stiffness. Struggles with endurance and balance , opted to do AAROM in sitting and supine.     Personal Factors and Comorbidities  Comorbidity 3+    Comorbidities  partial lung removal, deconditioning, neuropathy, decreased baseline balance;     Examination-Activity Limitations  Bathing;Hygiene/Grooming;Stand;Reach Overhead;Caring for Others;Carry;Dressing    Examination-Participation Restrictions  Shop;Cleaning;Personal Finances;Meal Prep;[place] Work    PT Treatment/Interventions  ADLs/Self Care Home Management;Cryotherapy;Electrical Stimulation;Iontophoresis 4mg /ml Dexamethasone;Ultrasound;Moist Heat;Functional mobility training;Therapeutic exercise;Patient/family education;Neuromuscular re-education;Manual techniques;Passive range of motion;Taping    PT Next Visit Plan  Follow [facility] protocol; PROM ER 0-30; PROM shoulder flexion 90-120; begin shouldr isometrics when able. Per protovol AAROM for flexion is allowed; consider table slides; continue scapular stabilization; modalities PRN     PT Home Exercise Plan  AAROM: table slide flexion, pendulum, wrist/elbow AROM, scap squeeze     Consulted and Agree with Plan of Care   Patient       Patient will benefit from skilled therapeutic intervention in order to improve the following deficits and impairments:  Pain, Impaired UE functional use, Postural dysfunction, Increased fascial restricitons, Decreased strength, Decreased activity tolerance, Cardiopulmonary status limiting activity, Decreased range of motion, Increased edema, Obesity, Decreased balance, Decreased endurance, Decreased mobility, Hypomobility  Visit Diagnosis: Chronic right shoulder pain  Stiffness of right shoulder, not elsewhere classified  Muscle weakness (generalized)  Other abnormalities of gait and mobility     Problem List Patient Active Problem List   Diagnosis Date Noted  . Shoulder arthritis 02/10/2019  . Other chronic pain 01/24/2019  . Sensory neuropathy 01/24/2019  . Prediabetes 01/24/2019  . Senile purpura ([place]) 10/18/2018  . Unilateral primary osteoarthritis, left knee 07/21/2018  . Peripheral neuropathy ([place]) 08/27/2016  . GERD (gastroesophageal reflux disease) 03/27/2016  . Subclinical hyperthyroidism 08/18/2015  . Gastric ulcer due to nonsteroidal antiinflammatory drug (NSAID) therapy   . Lung cancer ([place]) 05/09/2015  . Peripheral edema 05/01/2015  . Abnormal glucose 07/18/2014  . Vitamin D deficiency 07/18/2014  . Medication management 07/18/2014  . Hyperlipidemia, mixed 04/04/2014  . Charcot's Right foot 06/15/2013  . Hypertension     PAA,JENNIFER 03/07/2019, 12:53 PM  [facility] [address] [place], [place], [address] Phone: [phone]   Fax:  [phone]  Name: Tiffany [name] MRN: [id number] Date of Birth: [birth year]  Raeford Razor, PT 03/07/19 12:53 PM Phone: [phone] Fax: [phone]

## 2019-03-11 ENCOUNTER — Encounter

## 2019-03-14 ENCOUNTER — Encounter: Payer: Self-pay | Admitting: Physical Therapy

## 2019-03-14 ENCOUNTER — Ambulatory Visit: Payer: Medicare Other | Admitting: Physical Therapy

## 2019-03-14 ENCOUNTER — Other Ambulatory Visit: Payer: Self-pay

## 2019-03-14 DIAGNOSIS — R2689 Other abnormalities of gait and mobility: Secondary | ICD-10-CM | POA: Diagnosis not present

## 2019-03-14 DIAGNOSIS — M545 Low back pain, unspecified: Secondary | ICD-10-CM

## 2019-03-14 DIAGNOSIS — M25511 Pain in right shoulder: Principal | ICD-10-CM

## 2019-03-14 DIAGNOSIS — M25611 Stiffness of right shoulder, not elsewhere classified: Secondary | ICD-10-CM

## 2019-03-14 DIAGNOSIS — M6281 Muscle weakness (generalized): Secondary | ICD-10-CM | POA: Diagnosis not present

## 2019-03-14 DIAGNOSIS — G8929 Other chronic pain: Secondary | ICD-10-CM

## 2019-03-14 NOTE — Therapy (Addendum)
[facility] [address] [place], [place], [address] Phone: [phone]   Fax:  [phone]  Physical Therapy Treatment/Addended Discharge   Patient Details  Name: Tiffany [name] MRN: [id number] Date of Birth: Mar 10, 1946 Referring Provider (PT): Dr Cammy Copa    Encounter Date: 03/14/2019  PT End of Session - 03/14/19 1053    Visit Number  3    Number of Visits  16    Date for PT Re-Evaluation  04/26/19    PT Start Time  1010    PT Stop Time  1050    PT Time Calculation (min)  40 min    Activity Tolerance  Patient tolerated treatment well    Behavior During Therapy  [facility] for tasks assessed/performed       Past Medical History:  Diagnosis Date  . Anemia   . Arthritis   . Cancer (HCC)    lung cancer 2016  . Gait difficulty    problems with balance  . High blood pressure   . History of blood transfusion    05-14-15  . Hyperlipidemia   . Hyperthyroidism mild  . Neuropathy   . Obesity (BMI 35.0-39.9 without comorbidity)   . Pre-diabetes   . 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 Vitamin D deficiency   . Wears glasses   . Wears partial dentures     Past Surgical History:  Procedure Laterality Date  . CERVICAL DISC ARTHROPLASTY    . CRYO INTERCOSTAL NERVE BLOCK Left 05/09/2015   Procedure: CRYO INTERCOSTAL NERVE BLOCK;  Surgeon: Loreli Slot, MD;  Location: [facility] OR;  Service: Thoracic;  Laterality: Left;  . ESOPHAGOGASTRODUODENOSCOPY N/A 05/16/2015   Procedure: ESOPHAGOGASTRODUODENOSCOPY (EGD);  Surgeon: Hilarie Fredrickson, MD;  Location: [facility] ENDOSCOPY;  Service: Endoscopy;  Laterality: N/A;  possible ablation of a bleeding lesion  . knee replaced     right knee x 2  . LOBECTOMY Left 05/09/2015   Procedure: LEFT LUNG UPPER LOBECTOMY;  Surgeon: Loreli Slot, MD;  Location: [facility] OR;  Service: Thoracic;  Laterality: Left;  . LYMPH NODE DISSECTION Left 05/09/2015   Procedure: LYMPH NODE  DISSECTION;  Surgeon: Loreli Slot, MD;  Location: [facility] OR;  Service: Thoracic;  Laterality: Left;  Marland Kitchen MULTIPLE TOOTH EXTRACTIONS    . REVERSE SHOULDER ARTHROPLASTY Right 02/10/2019   Procedure: RIGHT REVERSE SHOULDER ARTHROPLASTY;  Surgeon: Cammy Copa, MD;  Location: [facility] OR;  Service: Orthopedics;  Laterality: Right;  . ROTATOR CUFF REPAIR Right   . SPINAL FUSION    . TONSILLECTOMY  age 82  . TOTAL HIP ARTHROPLASTY Left 07/13/2015   Procedure: LEFT TOTAL HIP ARTHROPLASTY ANTERIOR APPROACH;  Surgeon: Kathryne Hitch, MD;  Location: [facility] ORS;  Service: Orthopedics;  Laterality: Left;  Marland Kitchen VAGINAL HYSTERECTOMY    . VIDEO ASSISTED THORACOSCOPY (VATS)/WEDGE RESECTION Left 05/09/2015   Procedure: LEFT VIDEO ASSISTED THORACOSCOPY (VATS) WITH LEFT LUNG UPPER LOBE WEDGE RESECTION;  Surgeon: Loreli Slot, MD;  Location: [facility] OR;  Service: Thoracic;  Laterality: Left;  Marland Kitchen VIDEO BRONCHOSCOPY WITH ENDOBRONCHIAL NAVIGATION N/A 04/18/2015   Procedure: VIDEO BRONCHOSCOPY WITH ENDOBRONCHIAL NAVIGATION;  Surgeon: Leslye Peer, MD;  Location: [facility] OR;  Service: Thoracic;  Laterality: N/A;  . VIDEO BRONCHOSCOPY WITH ENDOBRONCHIAL ULTRASOUND N/A 04/18/2015   Procedure: VIDEO BRONCHOSCOPY WITH ENDOBRONCHIAL ULTRASOUND;  Surgeon: Leslye Peer, MD;  Location: [facility] OR;  Service: Thoracic;  Laterality: N/A;    There were no vitals filed for this  visit.  Subjective Assessment - 03/14/19 1015    Subjective  Its sore today.  4/10.     Currently in Pain?  Yes    Pain Score  4     Pain Location  Shoulder    Pain Orientation  Right              [facility] Adult PT Treatment/Exercise - 03/14/19 0001      Elbow Exercises   Elbow Flexion  AROM;Right;20 reps;Seated      Shoulder Exercises: Supine   Flexion  AAROM;Both;10 reps   short and long lever    Shoulder Flexion Weight (lbs)  used cane     Other Supine Exercises  retraction palms up x 10       Shoulder Exercises: Seated   Elevation   AAROM;Both;20 reps    Retraction  Strengthening;Both;12 reps    Retraction Weight (lbs)  cues     External Rotation  AAROM;Right;20 reps    External Rotation Weight (lbs)  table slides     Flexion  Strengthening;Both;10 reps    Flexion Weight (lbs)  cane     Other Seated Exercises  scaption table slides x 10     Other Seated Exercises  table slides flexion with lean forward       Shoulder Exercises: Pulleys   Flexion  3 minutes    Scaption  2 minutes      Shoulder Exercises: Isometric Strengthening   ABduction Limitations  deltoid x 15 reps PT assist gentle       Wrist Exercises   Wrist Flexion  Strengthening;Right;20 reps    Bar Weights/Barbell (Wrist Flexion)  1 lb    Wrist Extension  Strengthening;Right;15 reps;Seated    Bar Weights/Barbell (Wrist Extension)  1 lb      Cryotherapy   Number Minutes Cryotherapy  --   declines             PT Education - 03/14/19 1053    Education Details  AAROM, pulleys, pain allowed with exercise <6/10    Person(s) Educated  Patient    Methods  Explanation    Comprehension  Verbalized understanding       PT Short Term Goals - 03/07/19 1245      PT SHORT TERM GOAL #1   Title  Patient will transfer sit to stand with SBA     Baseline  min A     Status  On-going      PT SHORT TERM GOAL #2   Title  Patient will increase gross right shoulder strength to 3+/5     Status  On-going      PT SHORT TERM GOAL #3   Title  Patient will increase shoulder flexion PROM and ER by 20 degrees     Baseline  ER about 25 deg and flexion to 95 deg     Status  Achieved      PT SHORT TERM GOAL #4   Title  Pt will tolerate standing for LE exercise for 8 min or more with min increase in back pain.         PT Long Term Goals - 03/07/19 1246      PT LONG TERM GOAL #1   Title  Patient will reach behind her head without pain in order to do her hair    Status  On-going      PT LONG TERM GOAL #2   Title  Patient will reach to an overhead cabinet  without pain in order to perfrom AD:Ls     Status  On-going      PT LONG TERM GOAL #3   Title  Patient will demonstrate a 44% limitation on FOTO     Status  On-going            Plan - 03/14/19 1054    Clinical Impression Statement  Patient able to stand with less assist today, used cane to walk in clinic.  She performed table slides and advanced to pulleys, wand for AAROM.  Pt and her husband are both health compromised and I reinforced regular HEP if she is unable to leave her home for PT.     Personal Factors and Comorbidities  Comorbidity 3+    Comorbidities  partial lung removal, deconditioning, neuropathy, decreased baseline balance;     Examination-Activity Limitations  Bathing;Hygiene/Grooming;Stand;Reach Overhead;Caring for Others;Carry;Dressing    Examination-Participation Restrictions  Shop;Cleaning;Personal Finances;Meal Prep;Yard Work    PT Frequency  2x / week    PT Duration  8 weeks    PT Treatment/Interventions  ADLs/Self Care Home Management;Cryotherapy;Electrical Stimulation;Iontophoresis 4mg /ml Dexamethasone;Ultrasound;Moist Heat;Functional mobility training;Therapeutic exercise;Patient/family education;Neuromuscular re-education;Manual techniques;Passive range of motion;Taping    PT Next Visit Plan  Follow [facility] protocol; PROM ER 0-30; PROM shoulder flexion 90-120; begin shouldr isometrics when able. Per protovol AAROM for flexion is allowed; consider table slides; continue scapular stabilization; modalities PRN     PT Home Exercise Plan  AAROM: table slide flexion, pendulum, wrist/elbow AROM, scap squeeze , pulleys (has at home), supine wand flexion    Consulted and Agree with Plan of Care  Patient       Patient will benefit from skilled therapeutic intervention in order to improve the following deficits and impairments:  Pain, Impaired UE functional use, Postural dysfunction, Increased fascial restricitons, Decreased strength, Decreased activity  tolerance, Cardiopulmonary status limiting activity, Decreased range of motion, Increased edema, Obesity, Decreased balance, Decreased endurance, Decreased mobility, Hypomobility  Visit Diagnosis: Chronic right shoulder pain  Stiffness of right shoulder, not elsewhere classified  Muscle weakness (generalized)  Other abnormalities of gait and mobility  Acute low back pain without sciatica, unspecified back pain laterality     Problem List Patient Active Problem List   Diagnosis Date Noted  . Shoulder arthritis 02/10/2019  . Other chronic pain 01/24/2019  . Sensory neuropathy 01/24/2019  . Prediabetes 01/24/2019  . Senile purpura (HCC) 10/18/2018  . Unilateral primary osteoarthritis, left knee 07/21/2018  . Peripheral neuropathy (HCC) 08/27/2016  . GERD (gastroesophageal reflux disease) 03/27/2016  . Subclinical hyperthyroidism 08/18/2015  . Gastric ulcer due to nonsteroidal antiinflammatory drug (NSAID) therapy   . Lung cancer (HCC) 05/09/2015  . Peripheral edema 05/01/2015  . Abnormal glucose 07/18/2014  . Vitamin D deficiency 07/18/2014  . Medication management 07/18/2014  . Hyperlipidemia, mixed 04/04/2014  . Charcot's Right foot 06/15/2013  . Hypertension     Jakai Risse 03/14/2019, 10:59 AM  [facility] [address] [place], [place], [address] Phone: [phone]   Fax:  [phone]  Name: Tiffany [name] MRN: [id number] Date of Birth: 1946/03/16  Karie Mainland, PT 03/14/19 10:59 AM Phone: [phone] Fax: [phone]   PHYSICAL THERAPY DISCHARGE SUMMARY  Visits from Start of Care: 3  Current functional level related to goals / functional outcomes: Unknown, see above    Remaining deficits: Unknown   Education / Equipment: HEP , RICE  Plan: Patient agrees to discharge.  Patient goals were not met. Patient is being discharged due  to not returning since the last visit.  ?????    Clinic was shut  down due to COVID-19 and did not return.  Karie Mainland, PT 09/08/19 2:31 PM Phone: [phone] Fax: [phone]

## 2019-03-16 ENCOUNTER — Encounter: Payer: Medicare Other | Admitting: Physical Therapy

## 2019-03-21 ENCOUNTER — Ambulatory Visit: Payer: Medicare Other | Admitting: Physical Therapy

## 2019-03-23 ENCOUNTER — Ambulatory Visit: Payer: Medicare Other | Admitting: Physical Therapy

## 2019-03-25 ENCOUNTER — Telehealth (INDEPENDENT_AMBULATORY_CARE_PROVIDER_SITE_OTHER): Payer: Self-pay | Admitting: Radiology

## 2019-03-25 NOTE — Telephone Encounter (Signed)
Called and spoke with patient, patient answered NO to all screening questions.

## 2019-03-28 ENCOUNTER — Other Ambulatory Visit: Payer: Self-pay

## 2019-03-28 ENCOUNTER — Ambulatory Visit: Payer: Medicare Other | Admitting: Physical Therapy

## 2019-03-28 ENCOUNTER — Encounter (INDEPENDENT_AMBULATORY_CARE_PROVIDER_SITE_OTHER): Payer: Self-pay | Admitting: Orthopedic Surgery

## 2019-03-28 ENCOUNTER — Ambulatory Visit (INDEPENDENT_AMBULATORY_CARE_PROVIDER_SITE_OTHER): Payer: Medicare Other | Admitting: Orthopedic Surgery

## 2019-03-28 DIAGNOSIS — M12811 Other specific arthropathies, not elsewhere classified, right shoulder: Secondary | ICD-10-CM

## 2019-03-28 MED ORDER — OXYCODONE HCL 5 MG PO TABS: ORAL_TABLET | ORAL | 0 refills | Status: DC

## 2019-03-28 NOTE — Progress Notes (Signed)
Post-Op Visit Note   Patient: Tiffany [name]           Date of Birth: [birth year]           MRN: [id number] Visit Date: 03/28/2019 PCP: Unk Pinto, MD   Assessment & Plan:  Chief Complaint:  Chief Complaint  Patient presents with  . Right Shoulder - Follow-up   Visit Diagnoses:  1. Rotator cuff arthropathy of right shoulder     Plan: Tiffany [name] is a patient with right shoulder rotator cuff arthropathy status post reverse shoulder replacement 6 weeks ago.  She had to stop going to outpatient physical therapy.  On exam she does have pretty good passive range of motion.  Active range of motion is lacking a little bit.  I want her to do a home exercise program focusing on shoulder pulley and achieving a little bit more forward flexion.  Come back to see me in 3 months for clinical recheck but I want her to call me in a month and then we can decide about restarting her physical therapy once they open back up.  Follow-Up Instructions: Return in about 3 months (around 06/28/2019).   Orders:  No orders of the defined types were placed in this encounter.  No orders of the defined types were placed in this encounter.   Imaging: No results found.  PMFS History: Patient Active Problem List   Diagnosis Date Noted  . Shoulder arthritis 02/10/2019  . Other chronic pain 01/24/2019  . Sensory neuropathy 01/24/2019  . Prediabetes 01/24/2019  . Senile purpura ([place]) 10/18/2018  . Unilateral primary osteoarthritis, left knee 07/21/2018  . Peripheral neuropathy ([place]) 08/27/2016  . GERD (gastroesophageal reflux disease) 03/27/2016  . Subclinical hyperthyroidism 08/18/2015  . Gastric ulcer due to nonsteroidal antiinflammatory drug (NSAID) therapy   . Lung cancer ([place]) 05/09/2015  . Peripheral edema 05/01/2015  . Abnormal glucose 07/18/2014  . Vitamin D deficiency 07/18/2014  . Medication management 07/18/2014  . Hyperlipidemia, mixed 04/04/2014  . Charcot's Right foot 06/15/2013  .  Hypertension    Past Medical History:  Diagnosis Date  . Anemia   . Arthritis   . Cancer ([place])    lung cancer 2016  . Gait difficulty    problems with balance  . High blood pressure   . History of blood transfusion    05-14-15  . Hyperlipidemia   . Hyperthyroidism mild  . Neuropathy   . Obesity (BMI 35.0-39.9 without comorbidity)   . Pre-diabetes   . 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 Vitamin D deficiency   . Wears glasses   . Wears partial dentures     Family History  Problem Relation Age of Onset  . Heart attack Father   . Hypertension Mother   . Breast cancer Maternal Aunt   . Colon cancer Paternal Grandmother     Past Surgical History:  Procedure Laterality Date  . CERVICAL DISC ARTHROPLASTY    . CRYO INTERCOSTAL NERVE BLOCK Left 05/09/2015   Procedure: CRYO INTERCOSTAL NERVE BLOCK;  Surgeon: [name], MD;  Location: [facility];  Service: Thoracic;  Laterality: Left;  . ESOPHAGOGASTRODUODENOSCOPY N/A 05/16/2015   Procedure: ESOPHAGOGASTRODUODENOSCOPY (EGD);  Surgeon: Irene Shipper, MD;  Location: [facility] ENDOSCOPY;  Service: Endoscopy;  Laterality: N/A;  possible ablation of a bleeding lesion  . knee replaced     right knee x 2  . LOBECTOMY Left 05/09/2015   Procedure: LEFT LUNG UPPER LOBECTOMY;  Surgeon: Revonda Standard  Roxan Hockey, MD;  Location: [place];  Service: Thoracic;  Laterality: Left;  . LYMPH NODE DISSECTION Left 05/09/2015   Procedure: LYMPH NODE DISSECTION;  Surgeon: [name], MD;  Location: [place];  Service: Thoracic;  Laterality: Left;  Marland Kitchen MULTIPLE TOOTH EXTRACTIONS    . REVERSE SHOULDER ARTHROPLASTY Right 02/10/2019   Procedure: RIGHT REVERSE SHOULDER ARTHROPLASTY;  Surgeon: Meredith Pel, MD;  Location: [place];  Service: Orthopedics;  Laterality: Right;  . ROTATOR CUFF REPAIR Right   . SPINAL FUSION    . TONSILLECTOMY  age 37  . TOTAL HIP ARTHROPLASTY Left 07/13/2015   Procedure: LEFT TOTAL HIP ARTHROPLASTY ANTERIOR APPROACH;   Surgeon: Mcarthur Rossetti, MD;  Location: [facility] ORS;  Service: Orthopedics;  Laterality: Left;  Marland Kitchen VAGINAL HYSTERECTOMY    . VIDEO ASSISTED THORACOSCOPY (VATS)/WEDGE RESECTION Left 05/09/2015   Procedure: LEFT VIDEO ASSISTED THORACOSCOPY (VATS) WITH LEFT LUNG UPPER LOBE WEDGE RESECTION;  Surgeon: [name], MD;  Location: [facility];  Service: Thoracic;  Laterality: Left;  Marland Kitchen VIDEO BRONCHOSCOPY WITH ENDOBRONCHIAL NAVIGATION N/A 04/18/2015   Procedure: VIDEO BRONCHOSCOPY WITH ENDOBRONCHIAL NAVIGATION;  Surgeon: Collene Gobble, MD;  Location: [place];  Service: Thoracic;  Laterality: N/A;  . VIDEO BRONCHOSCOPY WITH ENDOBRONCHIAL ULTRASOUND N/A 04/18/2015   Procedure: VIDEO BRONCHOSCOPY WITH ENDOBRONCHIAL ULTRASOUND;  Surgeon: Collene Gobble, MD;  Location: [place];  Service: Thoracic;  Laterality: N/A;   Social History   Occupational History  . Occupation: retired  Tobacco Use  . Smoking status: Former Smoker    Packs/day: 1.00    Years: 30.00    Pack years: 30.00    Types: Cigarettes    Last attempt to quit: 05/08/2015    Years since quitting: 3.8  . Smokeless tobacco: Never Used  Substance and Sexual Activity  . Alcohol use: Yes    Alcohol/week: 2.0 standard drinks    Types: 2 Glasses of wine per week    Comment: 2 glasses wine  daily  . Drug use: No  . Sexual activity: Not on file

## 2019-03-30 ENCOUNTER — Ambulatory Visit: Payer: Medicare Other | Admitting: Physical Therapy

## 2019-04-06 ENCOUNTER — Telehealth: Payer: Self-pay | Admitting: Physical Therapy

## 2019-04-06 NOTE — Telephone Encounter (Signed)
Tiffany [name] was contacted today regarding the temporary reduction of OP Rehab Services due to concerns for community transmission of Covid-19.    Therapist advised the patient to continue to perform their HEP and assured they had no unanswered questions at this time.  The patient was offered and declined the continuation in their POC by using methods such as an e-visit, virtual check in, or telehealth visit.    Outpatient Rehabilitation Services will follow up with this client when we are able to safely resume care at the [facility] in person.   Patient is aware we can be reached by telephone during limited business hours in the meantime.

## 2019-04-09 ENCOUNTER — Other Ambulatory Visit: Payer: Self-pay | Admitting: Internal Medicine

## 2019-04-09 DIAGNOSIS — E782 Mixed hyperlipidemia: Secondary | ICD-10-CM

## 2019-04-09 MED ORDER — ROSUVASTATIN CALCIUM 20 MG PO TABS: ORAL_TABLET | ORAL | 3 refills | Status: DC

## 2019-04-29 NOTE — Progress Notes (Signed)
FOLLOW UP  Assessment and Plan:   Hypertension/ peripheral edema Well controlled  Monitor blood pressure at home; patient to call if consistently greater than 130/80 Continue DASH diet.   Reminder to go to the ER if any CP, SOB, nausea, dizziness, severe HA, changes vision/speech, left arm numbness and tingling and jaw pain.  Cholesterol Currently at goal; continue statin Continue low cholesterol diet and exercise.  Check lipid panel.   Other abnormal glucose Recent A1Cs at goal Discussed diet/exercise, weight management  Defer A1C; check BMP  Obesity with co morbidities Long discussion about weight loss, diet, and exercise Recommended diet heavy in fruits and veggies and low in animal meats, cheeses, and dairy products, appropriate calorie intake Discussed ideal weight for height - weight goal for next visit 220 lb Patient will work on watching portion sizes, increase water intake, chair exercises 10 min daily Will follow up in 3 months  Vitamin D Def At goal at last visit; continue supplementation to maintain goal of 70-100 Defer Vit D level  Subclinical hyperthyroidism -     TSH - will continue to monitor, no symptoms, has had normal RAI   Continue diet and meds as discussed. Further disposition pending results of labs. Discussed med's effects and SE's.   Over 30 minutes of exam, counseling, chart review, and critical decision making was performed.   Future Appointments  Date Time Provider [place]  08/22/2019 11:00 AM Unk Pinto, MD [facility] None  11/28/2019 11:15 AM Vicie Mutters, PA-C [facility] None    ----------------------------------------------------------------------------------------------------------------------  HPI 73 y.o. female  Presents accompanied by her husband for 3 month follow up on hypertension, cholesterol, glucose management, weight and vitamin D deficiency.   BMI is Body mass index is 36.64 kg/m., she has not been  working on diet, limited exercise due to balance. Uses cane.  Wt Readings from Last 3 Encounters:  05/02/19 227 lb (103 kg)  02/10/19 210 lb (95.3 kg)  01/26/19 230 lb 12.8 oz (104.7 kg)   Her blood pressure has been controlled at home, today their BP is BP: 126/84  She does not workout. She denies chest pain, shortness of breath, dizziness.   She is on cholesterol medication and denies myalgias. Her cholesterol is at goal. The cholesterol last visit was:   Lab Results  Component Value Date   CHOL 209 (H) 01/24/2019   HDL 80 01/24/2019   LDLCALC 101 (H) 01/24/2019   TRIG 190 (H) 01/24/2019   CHOLHDL 2.6 01/24/2019    She has been working on diet and exercise for glucose management, and denies foot ulcerations, increased appetite, nausea, paresthesia of the feet, polydipsia, polyuria, visual disturbances and vomiting. Last A1C in the office was:  Lab Results  Component Value Date   HGBA1C 5.2 01/24/2019   Patient is on Vitamin D supplement.   Lab Results  Component Value Date   VD25OH 100 01/24/2019        Current Medications:    Current Outpatient Medications (Cardiovascular):  .  atenolol (TENORMIN) 100 MG tablet, TAKE 1 TABLET BY MOUTH DAILY FOR BLOOD PRESSURE (Patient taking differently: Take 50 mg by mouth daily. ) .  enalapril (VASOTEC) 20 MG tablet, TAKE ONE TABLET BY MOUTH DAILY .  furosemide (LASIX) 40 MG tablet, Take 1 tablet daily for BP & Fluid Retention .  rosuvastatin (CRESTOR) 20 MG tablet, Take 1 tablet daily for Cholesterol  Current Outpatient Medications (Respiratory):  .  chlorpheniramine (CHLOR-TRIMETON) 4 MG tablet, Take 4 mg by mouth  2 (two) times daily as needed for allergies. .  fluticasone (FLONASE) 50 MCG/ACT nasal spray, Use 1 to 2 sprays each nares 1 to 2 x /day    Current Outpatient Medications (Other):  Marland Kitchen  Cholecalciferol (VITAMIN D3) 125 MCG (5000 UT) CAPS, Take 5,000 Units by mouth 2 (two) times daily. .  famotidine (PEPCID) 40 MG tablet,  Take 1 tablet (40 mg total) by mouth every evening. .  Magnesium 250 MG TABS, Take 250 mg by mouth at bedtime.  .  Multiple Vitamins-Minerals (MULTIVITAMIN PO), Take 1 tablet by mouth at bedtime.  Marland Kitchen  nystatin (NYSTATIN) powder, Apply topically 4 (four) times daily. (Patient taking differently: Apply 1 g topically 4 (four) times daily as needed (for infection). )  Allergies:  Allergies  Allergen Reactions  . Other Other (See Comments)    Stool softeners caused stomach bloating, nausea, vomiting bleeding ulcer and blood transfusion     Medical History:  Past Medical History:  Diagnosis Date  . Anemia   . Arthritis   . Cancer ([place])    lung cancer 2016  . Gait difficulty    problems with balance  . High blood pressure   . History of blood transfusion    05-14-15  . Hyperlipidemia   . Hyperthyroidism mild  . Neuropathy   . Obesity (BMI 35.0-39.9 without comorbidity)   . Pre-diabetes   . 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 Vitamin D deficiency   . Wears glasses   . Wears partial dentures    Family history- Reviewed and unchanged Social history- Reviewed and unchanged   Review of Systems:  Review of Systems  Constitutional: Negative for malaise/fatigue and weight loss.  HENT: Negative for hearing loss and tinnitus.   Eyes: Negative for blurred vision and double vision.  Respiratory: Negative for cough, shortness of breath and wheezing.   Cardiovascular: Negative for chest pain, palpitations, orthopnea, claudication and leg swelling.  Gastrointestinal: Negative for abdominal pain, blood in stool, constipation, diarrhea, heartburn, melena, nausea and vomiting.  Genitourinary: Negative.   Musculoskeletal: Negative for joint pain and myalgias.  Skin: Negative for rash.  Neurological: Negative for dizziness, tingling, sensory change, weakness and headaches.  Endo/Heme/Allergies: Negative for polydipsia.  Psychiatric/Behavioral: Negative.   All other systems  reviewed and are negative.   Physical Exam: BP 126/84   Pulse 70   Temp (!) 97.1 F (36.2 C)   Ht 5\' 6"  (1.676 m)   Wt 227 lb (103 kg)   SpO2 95%   BMI 36.64 kg/m  Wt Readings from Last 3 Encounters:  05/02/19 227 lb (103 kg)  02/10/19 210 lb (95.3 kg)  01/26/19 230 lb 12.8 oz (104.7 kg)   General Appearance: Well nourished, in no apparent distress. Eyes: PERRLA, EOMs, conjunctiva no swelling or erythema Sinuses: No Frontal/maxillary tenderness ENT/Mouth: Ext aud canals clear, TMs without erythema, bulging. No erythema, swelling, or exudate on post pharynx.  Tonsils not swollen or erythematous. Hearing normal.  Neck: Supple, thyroid normal.  Respiratory: Respiratory effort normal, BS equal bilaterally without rales, rhonchi, wheezing or stridor.  Cardio: RRR with no MRGs. Brisk peripheral pulses without edema.  Abdomen: Soft, + BS.  Non tender, no guarding, rebound, hernias, masses. Lymphatics: Non tender without lymphadenopathy.  Musculoskeletal: Full ROM, symmetrical strength, slow unstead gait with cane Skin: Warm, dry without rashes, lesions, ecchymosis.  Neuro: Cranial nerves intact. No cerebellar symptoms. Resting tremor of bilateral hands Psych: Awake and oriented X 3, normal affect, Insight and  Judgment appropriate.    Vicie Mutters, PA-C 10:52 AM [facility]

## 2019-05-02 ENCOUNTER — Other Ambulatory Visit: Payer: Self-pay

## 2019-05-02 ENCOUNTER — Encounter: Payer: Self-pay | Admitting: Physician Assistant

## 2019-05-02 ENCOUNTER — Ambulatory Visit (INDEPENDENT_AMBULATORY_CARE_PROVIDER_SITE_OTHER): Payer: Medicare Other | Admitting: Physician Assistant

## 2019-05-02 VITALS — BP 126/84 | HR 70 | Temp 97.1°F | Ht 66.0 in | Wt 227.0 lb

## 2019-05-02 DIAGNOSIS — E559 Vitamin D deficiency, unspecified: Secondary | ICD-10-CM

## 2019-05-02 DIAGNOSIS — E0590 Thyrotoxicosis, unspecified without thyrotoxic crisis or storm: Secondary | ICD-10-CM

## 2019-05-02 DIAGNOSIS — G6289 Other specified polyneuropathies: Secondary | ICD-10-CM

## 2019-05-02 DIAGNOSIS — C3412 Malignant neoplasm of upper lobe, left bronchus or lung: Secondary | ICD-10-CM | POA: Diagnosis not present

## 2019-05-02 DIAGNOSIS — I10 Essential (primary) hypertension: Secondary | ICD-10-CM

## 2019-05-02 DIAGNOSIS — R7309 Other abnormal glucose: Secondary | ICD-10-CM | POA: Diagnosis not present

## 2019-05-02 DIAGNOSIS — Z79899 Other long term (current) drug therapy: Secondary | ICD-10-CM

## 2019-05-02 DIAGNOSIS — D692 Other nonthrombocytopenic purpura: Secondary | ICD-10-CM

## 2019-05-02 DIAGNOSIS — E782 Mixed hyperlipidemia: Secondary | ICD-10-CM | POA: Diagnosis not present

## 2019-05-02 NOTE — Patient Instructions (Addendum)
    When it comes to diets, agreement about the perfect plan isn't easy to find, even among the experts. Experts at the [employer] developed an idea known as the Healthy Eating Plate. Just imagine a plate divided into logical, healthy portions.  The emphasis is on diet quality:  Load up on vegetables and fruits - one-half of your plate: Aim for color and variety, and remember that potatoes don't count.  Go for whole grains - one-quarter of your plate: Whole wheat, barley, wheat berries, quinoa, oats, brown rice, and foods made with them. If you want pasta, go with whole wheat pasta.  Protein power - one-quarter of your plate: Fish, chicken, beans, and nuts are all healthy, versatile protein sources. Limit red meat.  The diet, however, does go beyond the plate, offering a few other suggestions.  Use healthy plant oils, such as olive, canola, soy, corn, sunflower and peanut. Check the labels, and avoid partially hydrogenated oil, which have unhealthy trans fats.  If you're thirsty, drink water. Coffee and tea are good in moderation, but skip sugary drinks and limit milk and dairy products to one or two daily servings.  The type of carbohydrate in the diet is more important than the amount. Some sources of carbohydrates, such as vegetables, fruits, whole grains, and beans-are healthier than others.  Finally, stay active.  Check out  Mini habits for weight loss book  2 apps for tracking food is myfitness pal  loseit OR can take picture of your food

## 2019-05-03 LAB — LIPID PANEL
Cholesterol: 176 mg/dL (ref <200)
HDL: 74 mg/dL (ref >=50)
LDL Cholesterol (Calc): 75 mg/dL (calc)
Non-HDL Cholesterol (Calc): 102 mg/dL (calc) (ref <130)
Total CHOL/HDL Ratio: 2.4 (calc) (ref <5.0)
Triglycerides: 169 mg/dL — ABNORMAL HIGH (ref <150)

## 2019-05-03 LAB — COMPLETE METABOLIC PANEL WITH GFR
AG Ratio: 1.4 (calc) (ref 1.0–2.5)
ALT: 19 U/L (ref 6–29)
AST: 24 U/L (ref 10–35)
Albumin: 4.2 g/dL (ref 3.6–5.1)
Alkaline phosphatase (APISO): 111 U/L (ref 37–153)
BUN: 13 mg/dL (ref 7–25)
CO2: 29 mmol/L (ref 20–32)
Calcium: 9.8 mg/dL (ref 8.6–10.4)
Chloride: 100 mmol/L (ref 98–110)
Creat: 0.93 mg/dL (ref 0.60–0.93)
GFR, Est African American: 72 mL/min/{1.73_m2} (ref >=60)
GFR, Est Non African American: 62 mL/min/{1.73_m2} (ref >=60)
Globulin: 2.9 g/dL (calc) (ref 1.9–3.7)
Glucose, Bld: 90 mg/dL (ref 65–99)
Potassium: 5 mmol/L (ref 3.5–5.3)
Sodium: 139 mmol/L (ref 135–146)
Total Bilirubin: 0.4 mg/dL (ref 0.2–1.2)
Total Protein: 7.1 g/dL (ref 6.1–8.1)

## 2019-05-03 LAB — CBC WITH DIFFERENTIAL/PLATELET
Absolute Monocytes: 694 cells/uL (ref 200–950)
Basophils Absolute: 76 cells/uL (ref 0–200)
Basophils Relative: 0.8 %
Eosinophils Absolute: 29 cells/uL (ref 15–500)
Eosinophils Relative: 0.3 %
HCT: 41 % (ref 35.0–45.0)
Hemoglobin: 13.4 g/dL (ref 11.7–15.5)
Lymphs Abs: 1853 cells/uL (ref 850–3900)
MCH: 29.5 pg (ref 27.0–33.0)
MCHC: 32.7 g/dL (ref 32.0–36.0)
MCV: 90.1 fL (ref 80.0–100.0)
MPV: 10.1 fL (ref 7.5–12.5)
Monocytes Relative: 7.3 %
Neutro Abs: 6850 cells/uL (ref 1500–7800)
Neutrophils Relative %: 72.1 %
Platelets: 346 10*3/uL (ref 140–400)
RBC: 4.55 10*6/uL (ref 3.80–5.10)
RDW: 14 % (ref 11.0–15.0)
Total Lymphocyte: 19.5 %
WBC: 9.5 10*3/uL (ref 3.8–10.8)

## 2019-05-03 LAB — MAGNESIUM: Magnesium: 2 mg/dL (ref 1.5–2.5)

## 2019-05-03 LAB — TSH: TSH: 0.06 mIU/L — ABNORMAL LOW (ref 0.40–4.50)

## 2019-05-03 LAB — HEMOGLOBIN A1C
Hgb A1c MFr Bld: 5.1 % of total Hgb (ref <5.7)
Mean Plasma Glucose: 100 (calc)
eAG (mmol/L): 5.5 (calc)

## 2019-05-04 ENCOUNTER — Telehealth: Payer: Self-pay | Admitting: Physical Therapy

## 2019-05-04 NOTE — Telephone Encounter (Signed)
Spoke to patient to notify that the clinic is slowly opening and resuming in-person patient appointments.   She would like to call us if she decides to resume Physical Therapy in the near future.

## 2019-08-19 ENCOUNTER — Other Ambulatory Visit: Payer: Self-pay | Admitting: Internal Medicine

## 2019-08-22 ENCOUNTER — Encounter: Payer: Self-pay | Admitting: Internal Medicine

## 2019-09-08 ENCOUNTER — Other Ambulatory Visit: Payer: Self-pay | Admitting: Internal Medicine

## 2019-09-28 ENCOUNTER — Ambulatory Visit (INDEPENDENT_AMBULATORY_CARE_PROVIDER_SITE_OTHER): Payer: Medicare Other | Admitting: Orthopedic Surgery

## 2019-09-28 ENCOUNTER — Encounter: Payer: Self-pay | Admitting: Orthopedic Surgery

## 2019-09-28 DIAGNOSIS — M12811 Other specific arthropathies, not elsewhere classified, right shoulder: Secondary | ICD-10-CM

## 2019-09-28 MED ORDER — HYDROCODONE-ACETAMINOPHEN 5-325 MG PO TABS: 1.0000 | ORAL_TABLET | Freq: Two times a day (BID) | ORAL | 0 refills | Status: DC | PRN

## 2019-09-28 NOTE — Progress Notes (Signed)
Office Visit Note   Patient: Tiffany [name]           Date of Birth: 10-11-1946           MRN: [id number] Visit Date: 09/28/2019 Requested by: Unk Pinto, [address],  [place] [address] PCP: Unk Pinto, MD  Subjective: Chief Complaint  Patient presents with  . Right Shoulder - Follow-up    HPI: Tiffany [name] is a patient who is now about 7 months out right reverse shoulder replacement.  She has been having some balance issues requiring her to use that right arm for a lot of balance related stabilizing.  She is been having some pain in that right shoulder at times.  Denies any fevers or chills.  The right arm is still functional.              ROS: All systems reviewed are negative as they relate to the chief complaint within the history of present illness.  Patient denies  fevers or chills.   Assessment & Plan: Visit Diagnoses:  1. Rotator cuff arthropathy of right shoulder     Plan: Impression is 7 months out right reverse shoulder replacement with reasonably good function but still is having some sporadic pain in the arm.  Requesting pain medicine today.  We will refill that 1 time.  Come back in February for repeat radiographs.  The shoulder is located today and deltoid function is good.  No redness around the incision.  Follow-Up Instructions: Return in about 5 months (around 02/26/2020).   Orders:  No orders of the defined types were placed in this encounter.  Meds ordered this encounter  Medications  . HYDROcodone-acetaminophen (NORCO/VICODIN) 5-325 MG tablet    Sig: Take 1 tablet by mouth every 12 (twelve) hours as needed for moderate pain.    Dispense:  40 tablet    Refill:  0      Procedures: No procedures performed   Clinical Data: No additional findings.  Objective: Vital Signs: There were no vitals taken for this visit.  Physical Exam:   Constitutional: Patient appears well-developed HEENT:  Head: Normocephalic  Eyes:EOM are normal Neck: Normal range of motion Cardiovascular: Normal rate Pulmonary/chest: Effort normal Neurologic: Patient is alert Skin: Skin is warm Psychiatric: Patient has normal mood and affect    Ortho Exam: Ortho exam demonstrates forward flexion abduction to about 90 degrees.  She has intact incision with no redness erythema or drainage.  Shoulder has very good passive range of motion.  No grinding or crepitus noted.  Pretty good external rotation strength to about 4+ out of 5.  Internal rotation strength and subscap strength testing is about 3+ out of 5.  Specialty Comments:  No specialty comments available.  Imaging: No results found.   PMFS History: Patient Active Problem List   Diagnosis Date Noted  . Shoulder arthritis 02/10/2019  . Other chronic pain 01/24/2019  . Sensory neuropathy 01/24/2019  . Senile purpura ([facility]) 10/18/2018  . Unilateral primary osteoarthritis, left knee 07/21/2018  . Peripheral neuropathy ([facility]) 08/27/2016  . GERD (gastroesophageal reflux disease) 03/27/2016  . Subclinical hyperthyroidism 08/18/2015  . Gastric ulcer due to nonsteroidal antiinflammatory drug (NSAID) therapy   . Lung cancer ([facility]) 05/09/2015  . Peripheral edema 05/01/2015  . Abnormal glucose 07/18/2014  . Vitamin D deficiency 07/18/2014  . Medication management 07/18/2014  . Hyperlipidemia, mixed 04/04/2014  . Charcot's Right foot 06/15/2013  . Hypertension    Past Medical History:  Diagnosis  Date  . Anemia   . Arthritis   . Cancer ([facility])    lung cancer 2016  . Gait difficulty    problems with balance  . High blood pressure   . History of blood transfusion    05-14-15  . Hyperlipidemia   . Hyperthyroidism mild  . Neuropathy   . Obesity (BMI 35.0-39.9 without comorbidity)   . Pre-diabetes   . 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 Vitamin D deficiency   . Wears glasses   . Wears partial dentures     Family History  Problem Relation Age of  Onset  . Heart attack Father   . Hypertension Mother   . Breast cancer Maternal Aunt   . Colon cancer Paternal Grandmother     Past Surgical History:  Procedure Laterality Date  . CERVICAL DISC ARTHROPLASTY    . CRYO INTERCOSTAL NERVE BLOCK Left 05/09/2015   Procedure: CRYO INTERCOSTAL NERVE BLOCK;  Surgeon: [name], MD;  Location: [facility];  Service: Thoracic;  Laterality: Left;  . ESOPHAGOGASTRODUODENOSCOPY N/A 05/16/2015   Procedure: ESOPHAGOGASTRODUODENOSCOPY (EGD);  Surgeon: Irene Shipper, MD;  [facility];  Service: Endoscopy;  Laterality: N/A;  possible ablation of a bleeding lesion  . knee replaced     right knee x 2  . LOBECTOMY Left 05/09/2015   Procedure: LEFT LUNG UPPER LOBECTOMY;  Surgeon: [name], MD;  Location: [facility];  Service: Thoracic;  Laterality: Left;  . LYMPH NODE DISSECTION Left 05/09/2015   Procedure: LYMPH NODE DISSECTION;  Surgeon: [name], MD;  Location: [facility];  Service: Thoracic;  Laterality: Left;  Marland Kitchen MULTIPLE TOOTH EXTRACTIONS    . REVERSE SHOULDER ARTHROPLASTY Right 02/10/2019   Procedure: RIGHT REVERSE SHOULDER ARTHROPLASTY;  Surgeon: Meredith Pel, MD;  Location: [facility];  Service: Orthopedics;  Laterality: Right;  . ROTATOR CUFF REPAIR Right   . SPINAL FUSION    . TONSILLECTOMY  age 34  . TOTAL HIP ARTHROPLASTY Left 07/13/2015   Procedure: LEFT TOTAL HIP ARTHROPLASTY ANTERIOR APPROACH;  Surgeon: Mcarthur Rossetti, MD;  Location: [facility];  Service: Orthopedics;  Laterality: Left;  Marland Kitchen VAGINAL HYSTERECTOMY    . VIDEO ASSISTED THORACOSCOPY (VATS)/WEDGE RESECTION Left 05/09/2015   Procedure: LEFT VIDEO ASSISTED THORACOSCOPY (VATS) WITH LEFT LUNG UPPER LOBE WEDGE RESECTION;  Surgeon: [name], MD;  Location: [facility];  Service: Thoracic;  Laterality: Left;  Marland Kitchen VIDEO BRONCHOSCOPY WITH ENDOBRONCHIAL NAVIGATION N/A 04/18/2015   Procedure: VIDEO BRONCHOSCOPY WITH ENDOBRONCHIAL NAVIGATION;  Surgeon: Collene Gobble, MD;  Location: [facility];  Service: Thoracic;  Laterality: N/A;  . VIDEO BRONCHOSCOPY WITH ENDOBRONCHIAL ULTRASOUND N/A 04/18/2015   Procedure: VIDEO BRONCHOSCOPY WITH ENDOBRONCHIAL ULTRASOUND;  Surgeon: Collene Gobble, MD;  Location: [facility];  Service: Thoracic;  Laterality: N/A;   Social History   Occupational History  . Occupation: retired  Tobacco Use  . Smoking status: Former Smoker    Packs/day: 1.00    Years: 30.00    Pack years: 30.00    Types: Cigarettes    Quit date: 05/08/2015    Years since quitting: 4.3  . Smokeless tobacco: Never Used  Substance and Sexual Activity  . Alcohol use: Yes    Alcohol/week: 2.0 standard drinks    Types: 2 Glasses of wine per week    Comment: 2 glasses wine  daily  . Drug use: No  . Sexual activity: Not on file

## 2019-10-15 ENCOUNTER — Other Ambulatory Visit: Payer: Self-pay | Admitting: Adult Health

## 2019-10-15 ENCOUNTER — Other Ambulatory Visit: Payer: Self-pay | Admitting: Physician Assistant

## 2019-10-26 ENCOUNTER — Encounter: Payer: Self-pay | Admitting: Internal Medicine

## 2019-10-26 NOTE — Progress Notes (Signed)
Comprehensive Evaluation &  Examination     This very nice 73 y.o. MWF presents for a  comprehensive evaluation and management of multiple medical co-morbidities.  Patient has been followed for HTN, HLD, Prediabetes  and Vitamin D Deficiency. Patient has GERD controlled on her meds. In May 2016, patient underwent VATS for a LUL Lung cancer by Dr Roxan Hockey.       HTN predates since 1998. Patient's BP has been controlled at home and patient denies any cardiac symptoms as chest pain, palpitations, shortness of breath, dizziness or ankle swelling. Today's BP is at goal - 132/90.      Patient's hyperlipidemia is controlled with diet and Rosuvastatin. Patient denies myalgias or other medication SE's. Last lipids were at goal albeit elevated Trig's:  Lab Results  Component Value Date   CHOL 176 05/02/2019   HDL 74 05/02/2019   LDLCALC 75 05/02/2019   TRIG 169 (H) 05/02/2019   CHOLHDL 2.4 05/02/2019      Patient has Morbid Obesity (BMI 34+) hx/o Prediabete / Insulin Resistance (A1c 5.3%  / Insulin 40 / 2012)  and patient denies reactive hypoglycemic symptoms, visual blurring, diabetic polys or paresthesias. Last A1c was Normal & at goal:  Lab Results  Component Value Date   HGBA1C 5.1 05/02/2019      Finally, patient has history of Vitamin D Deficiency ("41" / 2009) and last Vitamin D was at goal:  Lab Results  Component Value Date   VD25OH 100 01/24/2019   Current Outpatient Medications on File Prior to Visit  Medication Sig  . atenolol (TENORMIN) 100 MG tablet Take 1 tablet Daily for BP  . chlorpheniramine (CHLOR-TRIMETON) 4 MG tablet Take 4 mg by mouth 2 (two) times daily as needed for allergies.  . Cholecalciferol (VITAMIN D3) 125 MCG (5000 UT) CAPS Take 5,000 Units by mouth 2 (two) times daily.  . enalapril (VASOTEC) 20 MG tablet Take 1 tablet Daily for BP  . famotidine (PEPCID) 40 MG tablet Take 1 table Daily to Prevent Indigestion & Heartburn  . fluticasone (FLONASE) 50 MCG/ACT  nasal spray SPRAY ONE TO TWO SPRAYS IN THE AFFECTED NOSTRIL DAILY AS NEEDED FOR ALLERGY SYMPTOMS AND CONGESTION  . furosemide (LASIX) 40 MG tablet Take 1 tablet daily for BP & Fluid Retention  . Magnesium 250 MG TABS Take 250 mg by mouth at bedtime.   . Multiple Vitamins-Minerals (MULTIVITAMIN PO) Take 1 tablet by mouth at bedtime.   Marland Kitchen nystatin (NYSTATIN) powder Apply topically 4 (four) times daily. (Patient taking differently: Apply 1 g topically 4 (four) times daily as needed (for infection). )  . rosuvastatin (CRESTOR) 20 MG tablet Take 1 tablet daily for Cholesterol   No current facility-administered medications on file prior to visit.    Allergies  Allergen Reactions  . Other Other (See Comments)    Stool softeners caused stomach bloating, nausea, vomiting bleeding ulcer and blood transfusion   Past Medical History:  Diagnosis Date  . Anemia   . Arthritis   . Cancer ([facility])    lung cancer 2016  . Gait difficulty    problems with balance  . High blood pressure   . History of blood transfusion    05-14-15  . Hyperlipidemia   . Hyperthyroidism mild  . Neuropathy   . Obesity (BMI 35.0-39.9 without comorbidity)   . Pre-diabetes   . 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 Vitamin D deficiency   . Wears glasses   . Wears partial  dentures    Health Maintenance  Topic Date Due  . COLONOSCOPY  03/28/2017  . INFLUENZA VACCINE  07/30/2019  . MAMMOGRAM  11/22/2020  . TETANUS/TDAP  10/18/2028  . DEXA SCAN  Completed  . Hepatitis C Screening  Completed  . PNA vac Low Risk Adult  Completed   Immunization History  Administered Date(s) Administered  . Influenza Split 11/22/2013  . Influenza, High Dose Seasonal PF 11/29/2015, 08/27/2016, 09/14/2017, 10/18/2018  . Pneumococcal Conjugate-13 03/27/2016  . Pneumococcal Polysaccharide-23 10/01/1999, 09/14/2017  . Td 10/18/2018  . Tdap 05/29/2008  . Zoster 10/09/2011   Last Colon - 03/31/ 2008 Colonoscopy - Dr Sharlett Iles  - Recc 5 yr f/u - patient aware overdue EGD  - 05/16/2015 - Dr Henrene Pastor - Esophageal, Gastric & Duodenal Ulcers  Last MGM - 11/22/2018   Past Surgical History:  Procedure Laterality Date  . CERVICAL DISC ARTHROPLASTY    . CRYO INTERCOSTAL NERVE BLOCK Left 05/09/2015   Procedure: CRYO INTERCOSTAL NERVE BLOCK;  Surgeon: [name], MD;  Location: [facility];  Service: Thoracic;  Laterality: Left;  . ESOPHAGOGASTRODUODENOSCOPY N/A 05/16/2015   Procedure: ESOPHAGOGASTRODUODENOSCOPY (EGD);  Surgeon: Irene Shipper, MD;  Location[facility] ENDOSCOPY;  Service: Endoscopy;  Laterality: N/A;  possible ablation of a bleeding lesion  . knee replaced     right knee x 2  . LOBECTOMY Left 05/09/2015   Procedure: LEFT LUNG UPPER LOBECTOMY;  Surgeon: [name], MD;  Location: [facility];  Service: Thoracic;  Laterality: Left;  . LYMPH NODE DISSECTION Left 05/09/2015   Procedure: LYMPH NODE DISSECTION;  Surgeon: [name], MD;  Location: [facility];  Service: Thoracic;  Laterality: Left;  Marland Kitchen MULTIPLE TOOTH EXTRACTIONS    . REVERSE SHOULDER ARTHROPLASTY Right 02/10/2019   Procedure: RIGHT REVERSE SHOULDER ARTHROPLASTY;  Surgeon: Meredith Pel, MD;  Location: [facility];  Service: Orthopedics;  Laterality: Right;  . ROTATOR CUFF REPAIR Right   . SPINAL FUSION    . TONSILLECTOMY  age 6  . TOTAL HIP ARTHROPLASTY Left 07/13/2015   Procedure: LEFT TOTAL HIP ARTHROPLASTY ANTERIOR APPROACH;  Surgeon: Mcarthur Rossetti, MD;  [facility];  Service: Orthopedics;  Laterality: Left;  Marland Kitchen VAGINAL HYSTERECTOMY    . VIDEO ASSISTED THORACOSCOPY (VATS)/WEDGE RESECTION Left 05/09/2015   Procedure: LEFT VIDEO ASSISTED THORACOSCOPY (VATS) WITH LEFT LUNG UPPER LOBE WEDGE RESECTION;  Surgeon: [name], MD;  Location: [facility];  Service: Thoracic;  Laterality: Left;  Marland Kitchen VIDEO BRONCHOSCOPY WITH ENDOBRONCHIAL NAVIGATION N/A 04/18/2015   Procedure: VIDEO BRONCHOSCOPY WITH ENDOBRONCHIAL NAVIGATION;  Surgeon: Collene Gobble, MD;  Location: [facility];  Service: Thoracic;  Laterality: N/A;  . VIDEO BRONCHOSCOPY WITH ENDOBRONCHIAL ULTRASOUND N/A 04/18/2015   Procedure: VIDEO BRONCHOSCOPY WITH ENDOBRONCHIAL ULTRASOUND;  Surgeon: Collene Gobble, MD;  [facility];  Service: Thoracic;  Laterality: N/A;   Family History  Problem Relation Age of Onset  . Heart attack Father   . Hypertension Mother   . Breast cancer Maternal Aunt   . Colon cancer Paternal Grandmother    Social History   Tobacco Use  . Smoking status: Former Smoker    Packs/day: 1.00    Years: 30.00    Pack years: 30.00    Types: Cigarettes    Quit date: 05/08/2015    Years since quitting: 4.4  . Smokeless tobacco: Never Used  Substance Use Topics  . Alcohol use: Yes    Alcohol/week: 2.0 standard drinks    Types: 2 Glasses  of wine per week    Comment: 2 glasses wine  daily  . Drug use: No    ROS Constitutional: Denies fever, chills, weight loss/gain, headaches, insomnia,  night sweats, and change in appetite. Does c/o fatigue. Eyes: Denies redness, blurred vision, diplopia, discharge, itchy, watery eyes.  ENT: Denies discharge, congestion, post nasal drip, epistaxis, sore throat, earache, hearing loss, dental pain, Tinnitus, Vertigo, Sinus pain, snoring.  Cardio: Denies chest pain, palpitations, irregular heartbeat, syncope, dyspnea, diaphoresis, orthopnea, PND, claudication, edema Respiratory: denies cough, dyspnea, DOE, pleurisy, hoarseness, laryngitis, wheezing.  Gastrointestinal: Denies dysphagia, heartburn, reflux, water brash, pain, cramps, nausea, vomiting, bloating, diarrhea, constipation, hematemesis, melena, hematochezia, jaundice, hemorrhoids Genitourinary: Denies dysuria, frequency, urgency, nocturia, hesitancy, discharge, hematuria, flank pain Breast: Breast lumps, nipple discharge, bleeding.  Musculoskeletal: Denies arthralgia, myalgia, stiffness, Jt. Swelling, pain, limp, and strain/sprain. Denies falls. Skin: Denies  puritis, rash, hives, warts, acne, eczema, changing in skin lesion Neuro: No weakness, tremor, incoordination, spasms, paresthesia, pain Psychiatric: Denies confusion, memory loss, sensory loss. Denies Depression. Endocrine: Denies change in weight, skin, hair change, nocturia, and paresthesia, diabetic polys, visual blurring, hyper / hypo glycemic episodes.  Heme/Lymph: No excessive bleeding, bruising, enlarged lymph nodes.  Physical Exam  BP 132/90   Pulse 76   Temp (!) 97.5 F (36.4 C)   Resp 16   Ht 5\' 4"  (1.626 m)   Wt 230 lb 3.2 oz (104.4 kg)   BMI 39.51 kg/m   General Appearance: Well nourished, well groomed and in no apparent distress.  Eyes: PERRLA, EOMs, conjunctiva no swelling or erythema, normal fundi and vessels. Sinuses: No frontal/maxillary tenderness ENT/Mouth: EACs patent / TMs  nl. Nares clear without erythema, swelling, mucoid exudates. Oral hygiene is good. No erythema, swelling, or exudate. Tongue normal, non-obstructing. Tonsils not swollen or erythematous. Hearing normal.  Neck: Supple, thyroid not palpable. No bruits, nodes or JVD. Respiratory: Respiratory effort normal.  BS equal and clear bilateral without rales, rhonci, wheezing or stridor. Cardio: Heart sounds are normal with regular rate and rhythm and no murmurs, rubs or gallops. Peripheral pulses are normal and equal bilaterally without edema. No aortic or femoral bruits. Chest: symmetric with normal excursions and percussion. Breasts: Symmetric, without lumps, nipple discharge, retractions, or fibrocystic changes.  Abdomen: Flat, soft with bowel sounds active. Nontender, no guarding, rebound, hernias, masses, or organomegaly.  Lymphatics: Non tender without lymphadenopathy.  Genitourinary:  Musculoskeletal: Full ROM all peripheral extremities, joint stability, 5/5 strength, and normal gait. Skin: Warm and dry without rashes, lesions, cyanosis, clubbing or  ecchymosis.  Neuro: Cranial nerves intact,  reflexes equal bilaterally. Normal muscle tone, no cerebellar symptoms. Sensation intact.  Pysch: Alert and oriented X 3, normal affect, Insight and Judgment appropriate.   Assessment and Plan  1. Essential hypertension  - EKG 12-Lead - Urinalysis, Routine w reflex microscopic - Microalbumin / creatinine urine ratio - CBC with Differential/Platelet - COMPLETE METABOLIC PANEL WITH GFR - Magnesium - TSH  2. Hyperlipidemia, mixed  - EKG 12-Lead - Lipid panel - TSH  3. Abnormal glucose  - EKG 12-Lead - Hemoglobin A1c - Insulin, random  4. Vitamin D deficiency  - VITAMIN D 25 Hydroxyl  5. Hyperthyroidism, subclinical  - TSH  6. Prediabetes  - EKG 12-Lead - Hemoglobin A1c - Insulin, random  7. Chronic pain syndrome  8. Class 2 severe obesity due to excess calories with serious comorbidity and body mass index (BMI) of 39.0 to 39.9 in adult (HCC)  - phentermine (ADIPEX-P) 37.5 MG tablet; Take  1/2 to 1 tablet every Morning for Dieting & Weight Loss  Dispense: 90 tablet; Refill: 1 - topiramate (TOPAMAX) 50 MG tablet; Take 1/2 to 1 tablet 2 x /day at Suppertime & Bedtime for Dieting & Weight Loss  Dispense: 180 tablet; Refill: 1  9. Screening for colorectal cancer  - POC Hemoccult Bld/Stl  10. Tremor  - propranolol ER (INDERAL LA) 160 MG SR capsule; Take 1 tablet Daily for BP & Tremor  Dispense: 90 capsule; Refill: 3 - diazepam (VALIUM) 5 MG tablet; Take 1 /2 to 1 tablet 3 x /day for tremor  Dispense: 270 tablet; Refill: 1  11. Screening for ischemic heart disease  - EKG 12-Lead  12. FHx: heart disease  - EKG 12-Lead  13. Former smoker  - EKG 12-Lead  14. Medication management  - Urinalysis, Routine w reflex microscopic - Microalbumin / creatinine urine ratio - CBC with Differential/Platelet - COMPLETE METABOLIC PANEL WITH GFR - Magnesium - Lipid panel - TSH - Hemoglobin A1c - Insulin, random - VITAMIN D 25 Hydroxyl         Patient was counseled  in prudent diet to achieve/maintain BMI less than 25 for weight control, BP monitoring, regular exercise and medications. Discussed med's effects and SE's. Screening labs and tests as requested with regular follow-up as recommended. Over 40 minutes of exam, counseling, chart review and high complex critical decision making was performed.   Kirtland Bouchard, MD

## 2019-10-26 NOTE — Patient Instructions (Signed)

## 2019-10-27 ENCOUNTER — Ambulatory Visit (INDEPENDENT_AMBULATORY_CARE_PROVIDER_SITE_OTHER): Payer: Medicare Other | Admitting: Internal Medicine

## 2019-10-27 ENCOUNTER — Other Ambulatory Visit: Payer: Self-pay

## 2019-10-27 VITALS — BP 132/90 | HR 76 | Temp 97.5°F | Resp 16 | Ht 64.0 in | Wt 230.2 lb

## 2019-10-27 DIAGNOSIS — E559 Vitamin D deficiency, unspecified: Secondary | ICD-10-CM | POA: Diagnosis not present

## 2019-10-27 DIAGNOSIS — R251 Tremor, unspecified: Secondary | ICD-10-CM

## 2019-10-27 DIAGNOSIS — I10 Essential (primary) hypertension: Secondary | ICD-10-CM | POA: Diagnosis not present

## 2019-10-27 DIAGNOSIS — E0590 Thyrotoxicosis, unspecified without thyrotoxic crisis or storm: Secondary | ICD-10-CM

## 2019-10-27 DIAGNOSIS — R7303 Prediabetes: Secondary | ICD-10-CM | POA: Diagnosis not present

## 2019-10-27 DIAGNOSIS — G894 Chronic pain syndrome: Secondary | ICD-10-CM

## 2019-10-27 DIAGNOSIS — E782 Mixed hyperlipidemia: Secondary | ICD-10-CM | POA: Diagnosis not present

## 2019-10-27 DIAGNOSIS — Z79899 Other long term (current) drug therapy: Secondary | ICD-10-CM | POA: Diagnosis not present

## 2019-10-27 DIAGNOSIS — Z87891 Personal history of nicotine dependence: Secondary | ICD-10-CM | POA: Diagnosis not present

## 2019-10-27 DIAGNOSIS — R7309 Other abnormal glucose: Secondary | ICD-10-CM

## 2019-10-27 DIAGNOSIS — Z8249 Family history of ischemic heart disease and other diseases of the circulatory system: Secondary | ICD-10-CM | POA: Diagnosis not present

## 2019-10-27 DIAGNOSIS — Z136 Encounter for screening for cardiovascular disorders: Secondary | ICD-10-CM

## 2019-10-27 DIAGNOSIS — Z1211 Encounter for screening for malignant neoplasm of colon: Secondary | ICD-10-CM

## 2019-10-27 MED ORDER — DIAZEPAM 5 MG PO TABS: ORAL_TABLET | ORAL | 1 refills | Status: DC

## 2019-10-27 MED ORDER — PROPRANOLOL HCL ER 160 MG PO CP24: ORAL_CAPSULE | ORAL | 3 refills | Status: DC

## 2019-10-27 MED ORDER — PHENTERMINE HCL 37.5 MG PO TABS: ORAL_TABLET | ORAL | 1 refills | Status: DC

## 2019-10-27 MED ORDER — TOPIRAMATE 50 MG PO TABS: ORAL_TABLET | ORAL | 1 refills | Status: DC

## 2019-10-28 LAB — COMPLETE METABOLIC PANEL WITH GFR
AG Ratio: 1.3 (calc) (ref 1.0–2.5)
ALT: 14 U/L (ref 6–29)
AST: 20 U/L (ref 10–35)
Albumin: 4.2 g/dL (ref 3.6–5.1)
Alkaline phosphatase (APISO): 118 U/L (ref 37–153)
BUN/Creatinine Ratio: 23 (calc) — ABNORMAL HIGH (ref 6–22)
BUN: 27 mg/dL — ABNORMAL HIGH (ref 7–25)
CO2: 26 mmol/L (ref 20–32)
Calcium: 9.9 mg/dL (ref 8.6–10.4)
Chloride: 96 mmol/L — ABNORMAL LOW (ref 98–110)
Creat: 1.18 mg/dL — ABNORMAL HIGH (ref 0.60–0.93)
GFR, Est African American: 54 mL/min/{1.73_m2} — ABNORMAL LOW (ref >=60)
GFR, Est Non African American: 47 mL/min/{1.73_m2} — ABNORMAL LOW (ref >=60)
Globulin: 3.2 g/dL (calc) (ref 1.9–3.7)
Glucose, Bld: 97 mg/dL (ref 65–99)
Potassium: 5 mmol/L (ref 3.5–5.3)
Sodium: 135 mmol/L (ref 135–146)
Total Bilirubin: 0.5 mg/dL (ref 0.2–1.2)
Total Protein: 7.4 g/dL (ref 6.1–8.1)

## 2019-10-28 LAB — CBC WITH DIFFERENTIAL/PLATELET
Absolute Monocytes: 742 cells/uL (ref 200–950)
Basophils Absolute: 72 cells/uL (ref 0–200)
Basophils Relative: 0.7 %
Eosinophils Absolute: 52 cells/uL (ref 15–500)
Eosinophils Relative: 0.5 %
HCT: 40.6 % (ref 35.0–45.0)
Hemoglobin: 13.4 g/dL (ref 11.7–15.5)
Lymphs Abs: 2225 cells/uL (ref 850–3900)
MCH: 30.7 pg (ref 27.0–33.0)
MCHC: 33 g/dL (ref 32.0–36.0)
MCV: 93.1 fL (ref 80.0–100.0)
MPV: 9.7 fL (ref 7.5–12.5)
Monocytes Relative: 7.2 %
Neutro Abs: 7210 cells/uL (ref 1500–7800)
Neutrophils Relative %: 70 %
Platelets: 348 10*3/uL (ref 140–400)
RBC: 4.36 10*6/uL (ref 3.80–5.10)
RDW: 12.7 % (ref 11.0–15.0)
Total Lymphocyte: 21.6 %
WBC: 10.3 10*3/uL (ref 3.8–10.8)

## 2019-10-28 LAB — HEMOGLOBIN A1C
Hgb A1c MFr Bld: 5.1 % of total Hgb (ref <5.7)
Mean Plasma Glucose: 100 (calc)
eAG (mmol/L): 5.5 (calc)

## 2019-10-28 LAB — URINALYSIS, ROUTINE W REFLEX MICROSCOPIC
Bacteria, UA: NONE SEEN /HPF
Bilirubin Urine: NEGATIVE
Glucose, UA: NEGATIVE
Hgb urine dipstick: NEGATIVE
Hyaline Cast: NONE SEEN /LPF
Ketones, ur: NEGATIVE
Nitrite: NEGATIVE
Protein, ur: NEGATIVE
Specific Gravity, Urine: 1.012 (ref 1.001–1.03)
pH: 8 (ref 5.0–8.0)

## 2019-10-28 LAB — VITAMIN D 25 HYDROXY (VIT D DEFICIENCY, FRACTURES): Vit D, 25-Hydroxy: 92 ng/mL (ref 30–100)

## 2019-10-28 LAB — LIPID PANEL
Cholesterol: 174 mg/dL (ref <200)
HDL: 83 mg/dL (ref >=50)
LDL Cholesterol (Calc): 69 mg/dL (calc)
Non-HDL Cholesterol (Calc): 91 mg/dL (calc) (ref <130)
Total CHOL/HDL Ratio: 2.1 (calc) (ref <5.0)
Triglycerides: 137 mg/dL (ref <150)

## 2019-10-28 LAB — MICROALBUMIN / CREATININE URINE RATIO
Creatinine, Urine: 40 mg/dL (ref 20–275)
Microalb, Ur: <0.2 mg/dL

## 2019-10-28 LAB — TSH: TSH: 0.02 mIU/L — ABNORMAL LOW (ref 0.40–4.50)

## 2019-10-28 LAB — INSULIN, RANDOM: Insulin: 15.3 u[IU]/mL

## 2019-10-28 LAB — MAGNESIUM: Magnesium: 2.2 mg/dL (ref 1.5–2.5)

## 2019-10-30 ENCOUNTER — Encounter: Payer: Self-pay | Admitting: Internal Medicine

## 2019-10-30 ENCOUNTER — Other Ambulatory Visit: Payer: Self-pay | Admitting: Internal Medicine

## 2019-10-30 DIAGNOSIS — Z1211 Encounter for screening for malignant neoplasm of colon: Secondary | ICD-10-CM

## 2019-10-30 NOTE — Progress Notes (Unsigned)
History of Present Illness:       This very nice 73 y.o.female presents for 3 month follow up with HTN, HLD, Pre-Diabetes and Vitamin D Deficiency.       Patient is treated for HTN & BP has been controlled at home. Today's  . Patient has had no complaints of any cardiac type chest pain, palpitations, dyspnea / orthopnea / PND, dizziness, claudication, or dependent edema.      Hyperlipidemia is controlled with diet & meds. Patient denies myalgias or other med SE's. Last Lipids were  Lab Results  Component Value Date   CHOL 174 10/27/2019   HDL 83 10/27/2019   LDLCALC 69 10/27/2019   TRIG 137 10/27/2019   CHOLHDL 2.1 10/27/2019    Also, the patient has history of T2_NIDDM PreDiabetes and has had no symptoms of reactive hypoglycemia, diabetic polys, paresthesias or visual blurring.  Last A1c was       Further, the patient also has history of Vitamin D Deficiency and supplements vitamin D without any suspected side-effects. Last vitamin D was Lab Results  Component Value Date   VD25OH 92 10/27/2019    Current Outpatient Medications on File Prior to Visit  Medication Sig  . chlorpheniramine (CHLOR-TRIMETON) 4 MG tablet Take 4 mg by mouth 2 (two) times daily as needed for allergies.  . Cholecalciferol (VITAMIN D3) 125 MCG (5000 UT) CAPS Take 5,000 Units by mouth 2 (two) times daily.  . diazepam (VALIUM) 5 MG tablet Take 1 /2 to 1 tablet 3 x /day for tremor  . enalapril (VASOTEC) 20 MG tablet Take 1 tablet Daily for BP  . famotidine (PEPCID) 40 MG tablet Take 1 table Daily to Prevent Indigestion & Heartburn  . fluticasone (FLONASE) 50 MCG/ACT nasal spray SPRAY ONE TO TWO SPRAYS IN THE AFFECTED NOSTRIL DAILY AS NEEDED FOR ALLERGY SYMPTOMS AND CONGESTION  . furosemide (LASIX) 40 MG tablet Take 1 tablet daily for BP & Fluid Retention  . Magnesium 250 MG TABS Take 250 mg by mouth at bedtime.   . Multiple Vitamins-Minerals (MULTIVITAMIN PO) Take 1 tablet by mouth at bedtime.   Marland Kitchen  nystatin (NYSTATIN) powder Apply topically 4 (four) times daily. (Patient taking differently: Apply 1 g topically 4 (four) times daily as needed (for infection). )  . phentermine (ADIPEX-P) 37.5 MG tablet Take 1/2 to 1 tablet every Morning for Dieting & Weight Loss  . propranolol ER (INDERAL LA) 160 MG SR capsule Take 1 tablet Daily for BP & Tremor  . rosuvastatin (CRESTOR) 20 MG tablet Take 1 tablet daily for Cholesterol  . topiramate (TOPAMAX) 50 MG tablet Take 1/2 to 1 tablet 2 x /day at Suppertime & Bedtime for Dieting & Weight Loss   No current facility-administered medications on file prior to visit.     Allergies  Allergen Reactions  . Other Other (See Comments)    Stool softeners caused stomach bloating, nausea, vomiting bleeding ulcer and blood transfusion    PMHx:   Past Medical History:  Diagnosis Date  . Anemia   . Arthritis   . Cancer ([place])    lung cancer 2016  . Gait difficulty    problems with balance  . High blood pressure   . History of blood transfusion    05-14-15  . Hyperlipidemia   . Hyperthyroidism mild  . Neuropathy   . Obesity (BMI 35.0-39.9 without comorbidity)   . Pre-diabetes   . 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 Vitamin  D deficiency   . Wears glasses   . Wears partial dentures    Immunization History  Administered Date(s) Administered  . Influenza Split 11/22/2013  . Influenza, High Dose Seasonal PF 11/29/2015, 08/27/2016, 09/14/2017, 10/18/2018  . Pneumococcal Conjugate-13 03/27/2016  . Pneumococcal Polysaccharide-23 10/01/1999, 09/14/2017  . Td 10/18/2018  . Tdap 05/29/2008  . Zoster 10/09/2011   Past Surgical History:  Procedure Laterality Date  . CERVICAL DISC ARTHROPLASTY    . CRYO INTERCOSTAL NERVE BLOCK Left 05/09/2015   Procedure: CRYO INTERCOSTAL NERVE BLOCK;  Surgeon: [name], MD;  Location: [place];  Service: Thoracic;  Laterality: Left;  . ESOPHAGOGASTRODUODENOSCOPY N/A 05/16/2015   Procedure:  ESOPHAGOGASTRODUODENOSCOPY (EGD);  Surgeon: Irene Shipper, MD;  Location[facility] END[facility]COPY;  Service: Endoscopy;  Laterality: N/A;  possible ablation of a bleeding lesion  . knee replaced     right knee x 2  . LOBECTOMY Left 05/09/2015   Procedure: LEFT LUNG UPPER LOBECTOMY;  Surgeon: [name], MD;  Location: [place];  Service: Thoracic;  Laterality: Left;  . LYMPH NODE DISSECTION Left 05/09/2015   Procedure: LYMPH NODE DISSECTION;  Surgeon: [name], MD;  Location: [place];  Service: Thoracic;  Laterality: Left;  Marland Kitchen MULTIPLE TOOTH EXTRACTIONS    . REVERSE SHOULDER ARTHROPLASTY Right 02/10/2019   Procedure: RIGHT REVERSE SHOULDER ARTHROPLASTY;  Surgeon: Meredith Pel, MD;  Location: [place];  Service: Orthopedics;  Laterality: Right;  . ROTATOR CUFF REPAIR Right   . SPINAL FUSION    . TONSILLECTOMY  age 12  . TOTAL HIP ARTHROPLASTY Left 07/13/2015   Procedure: LEFT TOTAL HIP ARTHROPLASTY ANTERIOR APPROACH;  Surgeon: Mcarthur Rossetti, MD;  Location: [facility];  Service: Orthopedics;  Laterality: Left;  Marland Kitchen VAGINAL HYSTERECTOMY    . VIDEO ASSISTED THORACOSCOPY (VATS)/WEDGE RESECTION Left 05/09/2015   Procedure: LEFT VIDEO ASSISTED THORACOSCOPY (VATS) WITH LEFT LUNG UPPER LOBE WEDGE RESECTION;  Surgeon: [name], MD;  Location: [place];  Service: Thoracic;  Laterality: Left;  Marland Kitchen VIDEO BRONCHOSCOPY WITH ENDOBRONCHIAL NAVIGATION N/A 04/18/2015   Procedure: VIDEO BRONCHOSCOPY WITH ENDOBRONCHIAL NAVIGATION;  Surgeon: Collene Gobble, MD;  Location: [place];  Service: Thoracic;  Laterality: N/A;  . VIDEO BRONCHOSCOPY WITH ENDOBRONCHIAL ULTRASOUND N/A 04/18/2015   Procedure: VIDEO BRONCHOSCOPY WITH ENDOBRONCHIAL ULTRASOUND;  Surgeon: Collene Gobble, MD;  Location: [place];  Service: Thoracic;  Laterality: N/A;    FHx:    Reviewed / unchanged  SHx:    Reviewed / unchanged   Systems Review:  Constitutional: Denies fever, chills, wt changes, headaches, insomnia, fatigue, night  sweats, change in appetite. Eyes: Denies redness, blurred vision, diplopia, discharge, itchy, watery eyes.  ENT: Denies discharge, congestion, post nasal drip, epistaxis, sore throat, earache, hearing loss, dental pain, tinnitus, vertigo, sinus pain, snoring.  CV: Denies chest pain, palpitations, irregular heartbeat, syncope, dyspnea, diaphoresis, orthopnea, PND, claudication or edema. Respiratory: denies cough, dyspnea, DOE, pleurisy, hoarseness, laryngitis, wheezing.  Gastrointestinal: Denies dysphagia, odynophagia, heartburn, reflux, water brash, abdominal pain or cramps, nausea, vomiting, bloating, diarrhea, constipation, hematemesis, melena, hematochezia  or hemorrhoids. Genitourinary: Denies dysuria, frequency, urgency, nocturia, hesitancy, discharge, hematuria or flank pain. Musculoskeletal: Denies arthralgias, myalgias, stiffness, jt. swelling, pain, limping or strain/sprain.  Skin: Denies pruritus, rash, hives, warts, acne, eczema or change in skin lesion(s). Neuro: No weakness, tremor, incoordination, spasms, paresthesia or pain. Psychiatric: Denies confusion, memory loss or sensory loss. Endo: Denies change in weight, skin or hair change.  Heme/Lymph: No excessive bleeding, bruising or enlarged  lymph nodes.  Physical Exam  There were no vitals taken for this visit.  Appears  well nourished, well groomed  and in no distress.  Eyes: PERRLA, EOMs, conjunctiva no swelling or erythema. Sinuses: No frontal/maxillary tenderness ENT/Mouth: EAC's clear, TM's nl w/o erythema, bulging. Nares clear w/o erythema, swelling, exudates. Oropharynx clear without erythema or exudates. Oral hygiene is good. Tongue normal, non obstructing. Hearing intact.  Neck: Supple. Thyroid not palpable. Car 2+/2+ without bruits, nodes or JVD. Chest: Respirations nl with BS clear & equal w/o rales, rhonchi, wheezing or stridor.  Cor: Heart sounds normal w/ regular rate and rhythm without sig. murmurs, gallops,  clicks or rubs. Peripheral pulses normal and equal  without edema.  Abdomen: Soft & bowel sounds normal. Non-tender w/o guarding, rebound, hernias, masses or organomegaly.  Lymphatics: Unremarkable.  Musculoskeletal: Full ROM all peripheral extremities, joint stability, 5/5 strength and normal gait.  Skin: Warm, dry without exposed rashes, lesions or ecchymosis apparent.  Neuro: Cranial nerves intact, reflexes equal bilaterally. Sensory-motor testing grossly intact. Tendon reflexes grossly intact.  Pysch: Alert & oriented x 3.  Insight and judgement nl & appropriate. No ideations.  Assessment and Plan:  - Continue medication, monitor blood pressure at home.  - Continue DASH diet.  Reminder to go to the ER if any CP,  SOB, nausea, dizziness, severe HA, changes vision/speech.  - Continue diet/meds, exercise,& lifestyle modifications.  - Continue monitor periodic cholesterol/liver & renal functions    - Continue diet, exercise  - Lifestyle modifications.  - Monitor appropriate labs. - Continue supplementation.       Discussed  regular exercise, BP monitoring, weight control to achieve/maintain BMI less than 25 and discussed med and SE's. Recommended labs to assess and monitor clinical status with further disposition pending results of labs.  I discussed the assessment and treatment plan with the patient. The patient was provided an opportunity to ask questions and all were answered. The patient agreed with the plan and demonstrated an understanding of the instructions.  I provided over 30 minutes of exam, counseling, chart review and  complex critical decision making.  Kirtland Bouchard, MD

## 2019-10-31 ENCOUNTER — Encounter: Payer: Self-pay | Admitting: Physician Assistant

## 2019-11-07 ENCOUNTER — Telehealth: Payer: Self-pay | Admitting: *Deleted

## 2019-11-07 ENCOUNTER — Other Ambulatory Visit: Payer: Self-pay | Admitting: Internal Medicine

## 2019-11-07 DIAGNOSIS — R251 Tremor, unspecified: Secondary | ICD-10-CM

## 2019-11-07 MED ORDER — PROPRANOLOL HCL 40 MG PO TABS: ORAL_TABLET | ORAL | 3 refills | Status: DC

## 2019-11-07 NOTE — Telephone Encounter (Signed)
Patient called and reported the Propranolol 160 mg ER is causing her to have extreme weakness.  Per Dr Melford Aase, stop the 160 mg dose and a new RX for Propranolol 40  Mg tablet sent to her pharmacy. The patient was advised to start with 1/2 tablet with her meal and gradually increase to 1 whole tablet with meals.

## 2019-11-14 ENCOUNTER — Other Ambulatory Visit: Payer: Self-pay | Admitting: Internal Medicine

## 2019-11-14 DIAGNOSIS — I10 Essential (primary) hypertension: Secondary | ICD-10-CM

## 2019-11-22 ENCOUNTER — Ambulatory Visit: Payer: Medicare Other | Admitting: Adult Health Nurse Practitioner

## 2019-11-28 ENCOUNTER — Encounter: Payer: Self-pay | Admitting: Physician Assistant

## 2019-11-30 ENCOUNTER — Other Ambulatory Visit: Payer: Self-pay

## 2019-11-30 ENCOUNTER — Encounter: Payer: Self-pay | Admitting: Internal Medicine

## 2019-11-30 ENCOUNTER — Ambulatory Visit (INDEPENDENT_AMBULATORY_CARE_PROVIDER_SITE_OTHER): Payer: Medicare Other | Admitting: Internal Medicine

## 2019-11-30 VITALS — BP 114/68 | HR 88 | Temp 97.5°F | Resp 14 | Ht 64.0 in | Wt 230.0 lb

## 2019-11-30 DIAGNOSIS — I951 Orthostatic hypotension: Secondary | ICD-10-CM | POA: Diagnosis not present

## 2019-11-30 NOTE — Patient Instructions (Signed)

## 2019-11-30 NOTE — Progress Notes (Signed)
Subjective:    Patient ID: Tiffany [name], female    DOB: [birth year], 73 y.o.   MRN: [id number]  HPI        This very nice 73 y.o. MWF  with HTN, HLD, Prediabetes, GERD  and Vitamin D Deficiency who is brought in by her husband to evaluate for postural dizziness   Medication Sig  . chlorpheniramine (CHLOR-TRIMETON) 4 MG tablet Take 4 mg by mouth 2 (two) times daily as needed for allergies.  . Cholecalciferol (VITAMIN D3) 125 MCG (5000 UT) CAPS Take 5,000 Units by mouth 2 (two) times daily.  . diazepam (VALIUM) 5 MG tablet Take 1 /2 to 1 tablet 3 x /day for tremor  . enalapril (VASOTEC) 20 MG tablet Take 1 tablet Daily for BP  . famotidine (PEPCID) 40 MG tablet Take 1 table Daily to Prevent Indigestion & Heartburn  . fluticasone (FLONASE) 50 MCG/ACT nasal spray SPRAY ONE TO TWO SPRAYS IN THE AFFECTED NOSTRIL DAILY AS NEEDED FOR ALLERGY SYMPTOMS AND CONGESTION  . furosemide (LASIX) 40 MG tablet Take 1 tablet Daily for BP & Fluid Retention / Ankle Swelling  . Magnesium 250 MG TABS Take 250 mg by mouth at bedtime.   . Multiple Vitamins-Minerals (MULTIVITAMIN PO) Take 1 tablet by mouth at bedtime.   Marland Kitchen nystatin (NYSTATIN) powder Apply topically 4 (four) times daily. (Patient taking differently: Apply 1 g topically 4 (four) times daily as needed (for infection). )  . phentermine (ADIPEX-P) 37.5 MG tablet Take 1/2 to 1 tablet every Morning for Dieting & Weight Loss  . propranolol (INDERAL) 40 MG tablet Take 1/2 to 1 tablet 3 x /day with Meals for Tremor  . rosuvastatin (CRESTOR) 20 MG tablet Take 1 tablet daily for Cholesterol  . topiramate (TOPAMAX) 50 MG tablet Take 1/2 to 1 tablet 2 x /day at Suppertime & Bedtime for Dieting & Weight Loss    Allergies  Allergen Reactions  . Other Other (See Comments)    Stool softeners caused stomach bloating, nausea, vomiting bleeding ulcer and blood transfusion   Past Medical History:  Diagnosis Date  . Anemia   . Arthritis   . Cancer ([place])    lung  cancer 2016  . Gait difficulty    problems with balance  . High blood pressure   . History of blood transfusion    05-14-15  . Hyperlipidemia   . Hyperthyroidism mild  . Neuropathy   . Obesity (BMI 35.0-39.9 without comorbidity)   . Pre-diabetes   . 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 Vitamin D deficiency   . Wears glasses   . Wears partial dentures    Past Surgical History:  Procedure Laterality Date  . CERVICAL DISC ARTHROPLASTY    . CRYO INTERCOSTAL NERVE BLOCK Left 05/09/2015   Procedure: CRYO INTERCOSTAL NERVE BLOCK;  Surgeon: [name], MD;  Location: [place];  Service: Thoracic;  Laterality: Left;  . ESOPHAGOGASTRODUODENOSCOPY N/A 05/16/2015   Procedure: ESOPHAGOGASTRODUODENOSCOPY (EGD);  Surgeon: Irene Shipper, MD;  Location: [facility] ENDOSCOPY;  Service: Endoscopy;  Laterality: N/A;  possible ablation of a bleeding lesion  . knee replaced     right knee x 2  . LOBECTOMY Left 05/09/2015   Procedure: LEFT LUNG UPPER LOBECTOMY;  Surgeon: [name], MD;  Location: [place];  Service: Thoracic;  Laterality: Left;  . LYMPH NODE DISSECTION Left 05/09/2015   Procedure: LYMPH NODE DISSECTION;  Surgeon: [name], MD;  Location: [place];  Service: Thoracic;  Laterality: Left;  Marland Kitchen MULTIPLE TOOTH EXTRACTIONS    . REVERSE SHOULDER ARTHROPLASTY Right 02/10/2019   Procedure: RIGHT REVERSE SHOULDER ARTHROPLASTY;  Surgeon: Meredith Pel, MD;  Location: [place];  Service: Orthopedics;  Laterality: Right;  . ROTATOR CUFF REPAIR Right   . SPINAL FUSION    . TONSILLECTOMY  age 73  . TOTAL HIP ARTHROPLASTY Left 07/13/2015   Procedure: LEFT TOTAL HIP ARTHROPLASTY ANTERIOR APPROACH;  Surgeon: Mcarthur Rossetti, MD;  [facility];  Service: Orthopedics;  Laterality: Left;  Marland Kitchen VAGINAL HYSTERECTOMY    . VIDEO ASSISTED THORACOSCOPY (VATS)/WEDGE RESECTION Left 05/09/2015   Procedure: LEFT VIDEO ASSISTED THORACOSCOPY (VATS) WITH LEFT LUNG UPPER LOBE WEDGE  RESECTION;  Surgeon: [name], MD;  Location: [place];  Service: Thoracic;  Laterality: Left;  Marland Kitchen VIDEO BRONCHOSCOPY WITH ENDOBRONCHIAL NAVIGATION N/A 04/18/2015   Procedure: VIDEO BRONCHOSCOPY WITH ENDOBRONCHIAL NAVIGATION;  Surgeon: Collene Gobble, MD;  Location: [place];  Service: Thoracic;  Laterality: N/A;  . VIDEO BRONCHOSCOPY WITH ENDOBRONCHIAL ULTRASOUND N/A 04/18/2015   Procedure: VIDEO BRONCHOSCOPY WITH ENDOBRONCHIAL ULTRASOUND;  Surgeon: Collene Gobble, MD;  Location: [facility];  Service: Thoracic;  Laterality: N/A;    Review of Systems   10 point systems review negative except as above.    Objective:   Physical Exam  BP 114/68   P 88   T 97.5 F   R 14   Ht 5\' 4"   Wt 230 lb BMI 39.48   ReCheck Postural sit BP 128/98  P 90    &      Stand BP 82/56  P 79   HEENT - WNL. Neck - supple.  Chest - Clear equal BS. Cor - Nl HS. RRR w/o sig MGR. PP 1(+). No edema. MS- FROM w/o deformities.  Gait Nl. Neuro -  Nl w/o focal abnormalities.    Assessment & Plan:   1. Postural hypotension  - Advised holding BP meds (Propranolol & Enalapril) , "forcing po fluids, monitor home BP's and return 1 week for recheck

## 2019-12-01 ENCOUNTER — Encounter: Payer: Self-pay | Admitting: Internal Medicine

## 2019-12-03 ENCOUNTER — Encounter: Payer: Self-pay | Admitting: Internal Medicine

## 2019-12-07 NOTE — Progress Notes (Signed)
    C  A N  C  E  L  E  D  This note is not being shared with the patient for the following reason: To prevent harm (release of this note would result in harm to the life or physical safety of the patient or another).

## 2019-12-08 ENCOUNTER — Emergency Department (HOSPITAL_COMMUNITY): Payer: Medicare Other

## 2019-12-08 ENCOUNTER — Emergency Department (HOSPITAL_COMMUNITY)
Admission: EM | Admit: 2019-12-08 | Discharge: 2019-12-09 | Disposition: A | Discharge status: Home / Self Care | Payer: Medicare Other | Attending: Emergency Medicine | Admitting: Emergency Medicine

## 2019-12-08 ENCOUNTER — Ambulatory Visit: Payer: Medicare Other | Admitting: Internal Medicine

## 2019-12-08 ENCOUNTER — Other Ambulatory Visit: Payer: Self-pay

## 2019-12-08 ENCOUNTER — Encounter (HOSPITAL_COMMUNITY): Payer: Self-pay | Admitting: Emergency Medicine

## 2019-12-08 DIAGNOSIS — Z20828 Contact with and (suspected) exposure to other viral communicable diseases: Secondary | ICD-10-CM | POA: Diagnosis not present

## 2019-12-08 DIAGNOSIS — G600 Hereditary motor and sensory neuropathy: Secondary | ICD-10-CM

## 2019-12-08 DIAGNOSIS — R29898 Other symptoms and signs involving the musculoskeletal system: Secondary | ICD-10-CM | POA: Diagnosis not present

## 2019-12-08 DIAGNOSIS — R531 Weakness: Secondary | ICD-10-CM | POA: Diagnosis present

## 2019-12-08 DIAGNOSIS — N390 Urinary tract infection, site not specified: Secondary | ICD-10-CM | POA: Insufficient documentation

## 2019-12-08 LAB — CBC WITH DIFFERENTIAL/PLATELET
Abs Immature Granulocytes: 0.02 10*3/uL (ref 0.00–0.07)
Basophils Absolute: 0.1 10*3/uL (ref 0.0–0.1)
Basophils Relative: 1 %
Eosinophils Absolute: 0.4 10*3/uL (ref 0.0–0.5)
Eosinophils Relative: 6 %
HCT: 42 % (ref 36.0–46.0)
Hemoglobin: 13.2 g/dL (ref 12.0–15.0)
Immature Granulocytes: 0 %
Lymphocytes Relative: 19 %
Lymphs Abs: 1.4 10*3/uL (ref 0.7–4.0)
MCH: 30.4 pg (ref 26.0–34.0)
MCHC: 31.4 g/dL (ref 30.0–36.0)
MCV: 96.8 fL (ref 80.0–100.0)
Monocytes Absolute: 0.7 10*3/uL (ref 0.1–1.0)
Monocytes Relative: 9 %
Neutro Abs: 4.6 10*3/uL (ref 1.7–7.7)
Neutrophils Relative %: 65 %
Platelets: 265 10*3/uL (ref 150–400)
RBC: 4.34 MIL/uL (ref 3.87–5.11)
RDW: 13.8 % (ref 11.5–15.5)
WBC: 7.1 10*3/uL (ref 4.0–10.5)
nRBC: 0 % (ref 0.0–0.2)

## 2019-12-08 LAB — COMPREHENSIVE METABOLIC PANEL
ALT: 18 U/L (ref 0–44)
AST: 23 U/L (ref 15–41)
Albumin: 3.3 g/dL — ABNORMAL LOW (ref 3.5–5.0)
Alkaline Phosphatase: 105 U/L (ref 38–126)
Anion gap: 11 (ref 5–15)
BUN: 15 mg/dL (ref 8–23)
CO2: 23 mmol/L (ref 22–32)
Calcium: 9.3 mg/dL (ref 8.9–10.3)
Chloride: 103 mmol/L (ref 98–111)
Creatinine, Ser: 1.54 mg/dL — ABNORMAL HIGH (ref 0.44–1.00)
GFR calc Af Amer: 39 mL/min — ABNORMAL LOW (ref >60)
GFR calc non Af Amer: 34 mL/min — ABNORMAL LOW (ref >60)
Glucose, Bld: 100 mg/dL — ABNORMAL HIGH (ref 70–99)
Potassium: 4.4 mmol/L (ref 3.5–5.1)
Sodium: 137 mmol/L (ref 135–145)
Total Bilirubin: 0.4 mg/dL (ref 0.3–1.2)
Total Protein: 6.8 g/dL (ref 6.5–8.1)

## 2019-12-08 LAB — URINALYSIS, ROUTINE W REFLEX MICROSCOPIC
Bilirubin Urine: NEGATIVE
Glucose, UA: NEGATIVE mg/dL
Hgb urine dipstick: NEGATIVE
Ketones, ur: NEGATIVE mg/dL
Nitrite: NEGATIVE
Protein, ur: NEGATIVE mg/dL
Specific Gravity, Urine: 1.009 (ref 1.005–1.030)
pH: 7 (ref 5.0–8.0)

## 2019-12-08 LAB — TSH: TSH: 0.155 u[IU]/mL — ABNORMAL LOW (ref 0.350–4.500)

## 2019-12-08 LAB — VITAMIN B12: Vitamin B-12: 598 pg/mL (ref 180–914)

## 2019-12-08 LAB — SARS CORONAVIRUS 2 (TAT 6-24 HRS): SARS Coronavirus 2: NEGATIVE

## 2019-12-08 MED ORDER — LORAZEPAM 2 MG/ML IJ SOLN
1.0000 mg | Freq: Once | INTRAMUSCULAR | Status: AC
  Administered 2019-12-08: 1 mg via INTRAVENOUS
  Filled 2019-12-08: qty 1

## 2019-12-08 MED ORDER — CEPHALEXIN 250 MG PO CAPS
500.0000 mg | ORAL_CAPSULE | Freq: Once | ORAL | Status: AC
  Administered 2019-12-08: 500 mg via ORAL
  Filled 2019-12-08: qty 2

## 2019-12-08 MED ORDER — GADOBUTROL 1 MMOL/ML IV SOLN
10.0000 mL | Freq: Once | INTRAVENOUS | Status: AC | PRN
  Administered 2019-12-08: 10 mL via INTRAVENOUS

## 2019-12-08 MED ORDER — CEPHALEXIN 500 MG PO CAPS: 500.0000 mg | ORAL_CAPSULE | Freq: Two times a day (BID) | ORAL | 0 refills | Status: AC

## 2019-12-08 MED ORDER — METHYLPREDNISOLONE 4 MG PO TBPK: ORAL_TABLET | ORAL | 0 refills | Status: DC

## 2019-12-08 NOTE — ED Notes (Signed)
Patient transported to MRI 

## 2019-12-08 NOTE — ED Provider Notes (Signed)
[facility] EMERGENCY DEPARTMENT Provider Note   CSN: [id number] Arrival date & time: 12/08/19  1050     History Chief Complaint  Patient presents with  . Weakness    Tiffany [name] is a 73 y.o. female.  HPI   73 year old female with lower extremity weakness.  She reports a past history of Charcot-Marie-Tooth disease.  She reports that she began having increasing upper extremity tremor about a month ago.  She was started on propranolol for this.  Around the same time she noticed increasing weakness in her legs.  Her PCP then reduced the dose.   She says that she has had chronic problems with balance and that until recently she used a cane for assistance when ambulating.  About a month ago her weakness progressed to then needing to use a walker.  Despite this she estimates that she has fallen approximately 10 times in the past weeks.  She is unable to get herself up even with assistance of her husband and local first responders have had to assist her several times.  She is now at the point where she is in a wheelchair and she has a lot of difficulty even moving from a chair into the wheelchair.  She feels like the strength in her arms is about the same but the tremor is increased.  No acute visual changes or changes in speech.  Past Medical History:  Diagnosis Date  . Anemia   . Arthritis   . Cancer ([place])    lung cancer 2016  . Gait difficulty    problems with balance  . High blood pressure   . History of blood transfusion    05-14-15  . Hyperlipidemia   . Hyperthyroidism mild  . Neuropathy   . Neuropathy   . Obesity (BMI 35.0-39.9 without comorbidity)   . Pre-diabetes   . 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 Vitamin D deficiency   . Wears glasses   . Wears partial dentures     Patient Active Problem List   Diagnosis Date Noted  . Shoulder arthritis 02/10/2019  . Other chronic pain 01/24/2019  . Sensory neuropathy 01/24/2019  .  Senile purpura ([place]) 10/18/2018  . Unilateral primary osteoarthritis, left knee 07/21/2018  . Peripheral neuropathy ([place]) 08/27/2016  . GERD (gastroesophageal reflux disease) 03/27/2016  . Subclinical hyperthyroidism 08/18/2015  . Gastric ulcer due to nonsteroidal antiinflammatory drug (NSAID) therapy   . Lung cancer ([place]) 05/09/2015  . Peripheral edema 05/01/2015  . Abnormal glucose 07/18/2014  . Vitamin D deficiency 07/18/2014  . Medication management 07/18/2014  . Hyperlipidemia, mixed 04/04/2014  . Charcot's Right foot 06/15/2013  . Hypertension     Past Surgical History:  Procedure Laterality Date  . CERVICAL DISC ARTHROPLASTY    . CRYO INTERCOSTAL NERVE BLOCK Left 05/09/2015   Procedure: CRYO INTERCOSTAL NERVE BLOCK;  Surgeon: [name], MD;  Location: [facility];  Service: Thoracic;  Laterality: Left;  . ESOPHAGOGASTRODUODENOSCOPY N/A 05/16/2015   Procedure: ESOPHAGOGASTRODUODENOSCOPY (EGD);  Surgeon: Irene Shipper, MD;  Location: [facility] ENDOSCOPY;  Service: Endoscopy;  Laterality: N/A;  possible ablation of a bleeding lesion  . knee replaced     right knee x 2  . LOBECTOMY Left 05/09/2015   Procedure: LEFT LUNG UPPER LOBECTOMY;  Surgeon: [name], MD;  Location: [facility];  Service: Thoracic;  Laterality: Left;  . LYMPH NODE DISSECTION Left 05/09/2015   Procedure: LYMPH NODE DISSECTION;  Surgeon: [name],  MD;  Location: [facility];  Service: Thoracic;  Laterality: Left;  Marland Kitchen MULTIPLE TOOTH EXTRACTIONS    . REVERSE SHOULDER ARTHROPLASTY Right 02/10/2019   Procedure: RIGHT REVERSE SHOULDER ARTHROPLASTY;  Surgeon: Meredith Pel, MD;  Location: [facility];  Service: Orthopedics;  Laterality: Right;  . ROTATOR CUFF REPAIR Right   . SPINAL FUSION    . TONSILLECTOMY  age 51  . TOTAL HIP ARTHROPLASTY Left 07/13/2015   Procedure: LEFT TOTAL HIP ARTHROPLASTY ANTERIOR APPROACH;  Surgeon: Mcarthur Rossetti, MD;  Location: [facility];  Service: Orthopedics;  Laterality: Left;   Marland Kitchen VAGINAL HYSTERECTOMY    . VIDEO ASSISTED THORACOSCOPY (VATS)/WEDGE RESECTION Left 05/09/2015   Procedure: LEFT VIDEO ASSISTED THORACOSCOPY (VATS) WITH LEFT LUNG UPPER LOBE WEDGE RESECTION;  Surgeon: [name], MD;  Location: [facility];  Service: Thoracic;  Laterality: Left;  Marland Kitchen VIDEO BRONCHOSCOPY WITH ENDOBRONCHIAL NAVIGATION N/A 04/18/2015   Procedure: VIDEO BRONCHOSCOPY WITH ENDOBRONCHIAL NAVIGATION;  Surgeon: Collene Gobble, MD;  Location: [facility];  Service: Thoracic;  Laterality: N/A;  . VIDEO BRONCHOSCOPY WITH ENDOBRONCHIAL ULTRASOUND N/A 04/18/2015   Procedure: VIDEO BRONCHOSCOPY WITH ENDOBRONCHIAL ULTRASOUND;  Surgeon: Collene Gobble, MD;  Location: [facility];  Service: Thoracic;  Laterality: N/A;     OB History   No obstetric history on file.     Family History  Problem Relation Age of Onset  . Heart attack Father   . Hypertension Mother   . Breast cancer Maternal Aunt   . Colon cancer Paternal Grandmother     Social History   Tobacco Use  . Smoking status: Former Smoker    Packs/day: 1.00    Years: 30.00    Pack years: 30.00    Types: Cigarettes    Quit date: 05/08/2015    Years since quitting: 4.5  . Smokeless tobacco: Never Used  Substance Use Topics  . Alcohol use: Yes    Alcohol/week: 2.0 standard drinks    Types: 2 Glasses of wine per week    Comment: 2 glasses wine  daily  . Drug use: No    Home Medications Prior to Admission medications   Medication Sig Start Date End Date Taking? Authorizing Provider  chlorpheniramine (CHLOR-TRIMETON) 4 MG tablet Take 4 mg by mouth 2 (two) times daily as needed for allergies.    [provider]  Cholecalciferol (VITAMIN D3) 125 MCG (5000 UT) CAPS Take 5,000 Units by mouth 2 (two) times daily.    [provider]  diazepam (VALIUM) 5 MG tablet Take 1 /2 to 1 tablet 3 x /day for tremor 10/27/19   Unk Pinto, MD  enalapril (VASOTEC) 20 MG tablet Take 1 tablet Daily for BP 08/19/19   Unk Pinto,  MD  famotidine (PEPCID) 40 MG tablet Take 1 table Daily to Prevent Indigestion & Heartburn 10/15/19   Unk Pinto, MD  fluticasone (FLONASE) 50 MCG/ACT nasal spray SPRAY ONE TO TWO SPRAYS IN THE AFFECTED NOSTRIL DAILY AS NEEDED FOR ALLERGY SYMPTOMS AND CONGESTION 09/08/19   Unk Pinto, MD  furosemide (LASIX) 40 MG tablet Take 1 tablet Daily for BP & Fluid Retention / Ankle Swelling 11/14/19   Unk Pinto, MD  Magnesium 250 MG TABS Take 250 mg by mouth at bedtime.     [provider]  Multiple Vitamins-Minerals (MULTIVITAMIN PO) Take 1 tablet by mouth at bedtime.     [provider]  nystatin (NYSTATIN) powder Apply topically 4 (four) times daily. Patient taking differently: Apply 1 g topically 4 (four)  times daily as needed (for infection).  11/17/17   Unk Pinto, MD  phentermine (ADIPEX-P) 37.5 MG tablet Take 1/2 to 1 tablet every Morning for Dieting & Weight Loss 10/27/19   Unk Pinto, MD  propranolol (INDERAL) 40 MG tablet Take 1/2 to 1 tablet 3 x /day with Meals for Tremor 11/07/19   Unk Pinto, MD  rosuvastatin (CRESTOR) 20 MG tablet Take 1 tablet daily for Cholesterol 04/09/19   Unk Pinto, MD  topiramate (TOPAMAX) 50 MG tablet Take 1/2 to 1 tablet 2 x /day at Suppertime & Bedtime for Dieting & Weight Loss 10/27/19   Unk Pinto, MD    Allergies    Other  Review of Systems   Review of Systems All systems reviewed and negative, other than as noted in HPI.  Physical Exam Updated Vital Signs BP (!) 153/112 (BP Location: Right Arm)   Pulse 78   Temp 97.7 F (36.5 C) (Oral)   Resp 14   SpO2 96%   Physical Exam Vitals and nursing note reviewed.  Constitutional:      General: She is not in acute distress.    Appearance: She is well-developed.  HENT:     Head: Normocephalic and atraumatic.  Eyes:     General:        Right eye: No discharge.        Left eye: No discharge.     Conjunctiva/sclera: Conjunctivae normal.   Cardiovascular:     Rate and Rhythm: Normal rate and regular rhythm.     Heart sounds: Normal heart sounds. No murmur. No friction rub. No gallop.   Pulmonary:     Effort: Pulmonary effort is normal. No respiratory distress.     Breath sounds: Normal breath sounds.  Abdominal:     General: There is no distension.     Palpations: Abdomen is soft.     Tenderness: There is no abdominal tenderness.  Musculoskeletal:        General: No tenderness.     Cervical back: Neck supple.  Skin:    General: Skin is warm and dry.     Comments: Multiple bruises noted to bilateral upper extremities and abrasion to right shin.  Neurological:     Mental Status: She is alert.     Comments: Resting tremor of upper extremities.  Strength is 5 out of 5 bilateral upper extremities.  She cannot raise either leg against gravity.  Some muscle contractions and slight movement noted.  Psychiatric:        Behavior: Behavior normal.        Thought Content: Thought content normal.     ED Results / Procedures / Treatments   Labs (all labs ordered are listed, but only abnormal results are displayed) Labs Reviewed  COMPREHENSIVE METABOLIC PANEL - Abnormal; Notable for the following components:      Result Value   Glucose, Bld 100 (*)    Creatinine, Ser 1.54 (*)    Albumin 3.3 (*)    GFR calc non Af Amer 34 (*)    GFR calc Af Amer 39 (*)    All other components within normal limits  SARS CORONAVIRUS 2 (TAT 6-24 HRS)  CBC WITH DIFFERENTIAL/PLATELET  URINALYSIS, ROUTINE W REFLEX MICROSCOPIC  TSH  VITAMIN B12  VITAMIN B1    EKG EKG Interpretation  Date/Time:  Thursday December 08 2019 12:13:29 EST Ventricular Rate:  90 PR Interval:    QRS Duration: 95 QT Interval:  389 QTC Calculation: 476 R Axis:  Text Interpretation: Sinus rhythm Multiple ventricular premature complexes Left atrial enlargement Abnormal R-wave progression, early transition Confirmed by Virgel Manifold [phone]) on 12/08/2019 1:47:21  PM   Radiology CT Head Wo Contrast  Result Date: 12/08/2019 CLINICAL DATA:  Lower extremity weakness EXAM: CT HEAD WITHOUT CONTRAST TECHNIQUE: Contiguous axial images were obtained from the base of the skull through the vertex without intravenous contrast. COMPARISON:  None. FINDINGS: Brain: No evidence of acute infarction, hemorrhage, hydrocephalus, extra-axial collection or mass lesion/mass effect. Scattered low-density changes within the periventricular and subcortical white matter compatible with chronic microvascular ischemic change. Moderate diffuse cerebral volume loss. Vascular: Mild atherosclerotic calcifications involving the large vessels of the skull base. No unexpected hyperdense vessel. Skull: Normal. Negative for fracture or focal lesion. Sinuses/Orbits: No acute finding. Other: None. IMPRESSION: 1.  No acute intracranial findings. 2.  Chronic microvascular ischemic change and cerebral volume loss. Electronically Signed   By: Davina Poke M.D.   On: 12/08/2019 12:38    Procedures Procedures (including critical care time)  Medications Ordered in ED Medications - No data to display  ED Course  I have reviewed the triage vital signs and the nursing notes.  Pertinent labs & imaging results that were available during my care of the patient were reviewed by me and considered in my medical decision making (see chart for details).    MDM Rules/Calculators/A&P   73 year old female with progressive lower extremity weakness.  History of Charcot-Marie-Tooth.  Has reflexes and lower extremities although diminished.  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  Evaluated by neurology.  Recommending MRI of thoracic and lumbar spines.  If these do not show any pathology that would explain her symptoms then she can follow-up as an outpatient.  She is having significant difficulty with ADLs at home.  She is currently in a wheelchair and is having difficulty even transferring to and from this.   Think she would benefit from a social work/case management consult to see if there is any additional resources that can be provided for her.  Final Clinical Impression(s) / ED Diagnoses Final diagnoses:  Weakness of both lower extremities    Rx / DC Orders ED Discharge Orders    None       Virgel Manifold, MD 12/13/19 [phone]

## 2019-12-08 NOTE — Care Management (Signed)
ED CM met with patient at bedside patient A&Ox3 to discuss recommendations for [facility] services.  Patient is agreeable with recommendations.  Discussed [facility] list according to CMS quality compared list, [facility] was selected has had services with them in the past.  Patient ED evaluation is still in progress, awaiting MRI. CM will fax referral for [facility], PT,OT and SW to [facility] [place] will follow up with them tomorrow. Updated EDP.

## 2019-12-08 NOTE — ED Triage Notes (Signed)
Pt has neuropathy and started new medication 1 month ago.  Reports bilateral lower leg weakness and decreased mobility x 1 month.  Her husband called PCP today because she had a follow-up appt for today and was advised to come to ED instead.

## 2019-12-08 NOTE — ED Notes (Signed)
Called ptar for tranport home

## 2019-12-08 NOTE — Progress Notes (Addendum)
CSW received a call from [facility] updating the [facility] CSW.  [facility] ED TOC RN CM is aware.  CSW will continue to follow for D/C needs.  Alphonse Guild. Naika Noto, LCSW, LCAS, CSI Transitions of Care Clinical Social Worker Care Coordination Department Ph: [phone]

## 2019-12-08 NOTE — Discharge Instructions (Addendum)
You did have a urinary tract infection likely.  Take antibiotic as prescribed as well.

## 2019-12-08 NOTE — ED Notes (Signed)
Patient transported to CT 

## 2019-12-08 NOTE — ED Notes (Signed)
Pt presents with BLE weakness x2 weeks. Pt's PCP prescribed her a new medication for hand tremors and with in two days she began experiencing BLE weakness that has gotten progressively worse. Pt states she usually uses a walker for assistance but is now requiring a wheel chair to get around.

## 2019-12-08 NOTE — ED Provider Notes (Signed)
Patient with lower leg weakness over the last several weeks.  Possibly chronic versus may be something acute.  Uses a wheelchair at baseline now.  Neurology recommended MRI of the thoracic and lumbar spine to rule out any neurological or musculoskeletal process there.  This imaging was overall unremarkable except for some severe spinal stenosis at L4-L5.  Discussed findings with Dr. Arnoldo Morale with neurosurgery who states that this is likely chronic but would recommend steroids and outpatient follow-up to discuss disease process.  However, I suspect that maybe weakness is from decompensation.  Otherwise vitamin levels are unremarkable.  She will follow up with neurology as well as previously planned.  Given return precautions.  Appeared that she may have a urinary tract infection and antibiotics have been given.  This chart was dictated using voice recognition software.  Despite best efforts to proofread,  errors can occur which can change the documentation meaning.    Lennice Sites, DO 12/08/19 2307

## 2019-12-08 NOTE — Consult Note (Addendum)
Neurology Consultation  Reason for Consult: Lower extremity weakness  Referring Physician: Dr. Wilson Singer  CC: Progressive lower extremity weakness  History is obtained from: Patient and chart   HPI: Tiffany [name] is a 73 y.o. female with history of prediabetes, obesity, neuropathy, hypothyroidism, hyperlipidemia, Charcot-Marie-Tooth disease (she states that her sister was diagnosed with CMT-1, but is unsure if she has received a formal diagnosis of her subtype of CMT), hypertension and gait difficulty.  According to patient, approximately 2 to 3 weeks ago she was changed from a blood pressure medication to a second hypertension medication to help with her bilateral upper extremity tremors (a known symptom of CMT-1).  She states that about that time she noted that she is progressively getting weaker in her ambulation.  She did use a cane for walking but then over a period of 3 to 4 days she became significantly weak in her legs and she started to use a wheelchair. Currently she can provide some history but is not a great historian. It is clear that she has weakness in her legs she also states that her feet and legs are significantly painful.  She denies any bowel or bladder incontinence.  Of note patient does see Dr. Krista Blue of [facility].  Her last visit was back in 2014.  She was seen for gait difficulty, sudden onset of low back pain and radiating pain to her legs, followed by gradual onset of gait difficulty since early 2013, also notes that the right foot deformity had gotten worse.  At that time patient had a MRI of the lumbar spine which showed disc and facet degenerative changes with mild bilateral foraminal narrowing at L4-5 and L5-S1 without definitive foraminal compression.  At that time she also stated she had no bowel or bladder incontinence.  Dr. Krista Blue ordered RPR, O96, HIV, folic acid, ANA, CPK, ESR which were all negative.  She did have a decreased TSH and elevated CRP.  A nerve  conduction/EMG was obtained which showed consistency with mild to moderate axonal peripheral nerve neuropathy.  At that time the assessment was gait difficulty likely secondary to combined mild to moderate axonal peripheral neuropathy, bilateral foot deformity, right foot pain, collapsed right foot arch.  She states that she has always been slow at running for most of her youth, first noticed at about age 41 when she realized that she could only run much more slowly than her peers (a common component of the history for CMT-1 patients).    ED course CT head, labs   Past Medical History:  Diagnosis Date  . Anemia   . Arthritis   . Cancer ([place])    lung cancer 2016  . Gait difficulty    problems with balance  . High blood pressure   . History of blood transfusion    05-14-15  . Hyperlipidemia   . Hyperthyroidism mild  . Neuropathy   . Neuropathy   . Obesity (BMI 35.0-39.9 without comorbidity)   . Pre-diabetes   . 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 Vitamin D deficiency   . Wears glasses   . Wears partial dentures      Family History  Problem Relation Age of Onset  . Heart attack Father   . Hypertension Mother   . Breast cancer Maternal Aunt   . Colon cancer Paternal Grandmother    Social History:   reports that she quit smoking about 4 years ago. Her smoking use included cigarettes. She  has a 30.00 pack-year smoking history. She has never used smokeless tobacco. She reports current alcohol use of about 2.0 standard drinks of alcohol per week. She reports that she does not use drugs.  Medications No current facility-administered medications for this encounter.  Current Outpatient Medications:  .  chlorpheniramine (CHLOR-TRIMETON) 4 MG tablet, Take 4 mg by mouth 2 (two) times daily as needed for allergies., Disp: , Rfl:  .  Cholecalciferol (VITAMIN D3) 125 MCG (5000 UT) CAPS, Take 5,000 Units by mouth 2 (two) times daily., Disp: , Rfl:  .  diazepam (VALIUM)  5 MG tablet, Take 1 /2 to 1 tablet 3 x /day for tremor, Disp: 270 tablet, Rfl: 1 .  enalapril (VASOTEC) 20 MG tablet, Take 1 tablet Daily for BP, Disp: 90 tablet, Rfl: 3 .  famotidine (PEPCID) 40 MG tablet, Take 1 table Daily to Prevent Indigestion & Heartburn, Disp: 90 tablet, Rfl: 3 .  fluticasone (FLONASE) 50 MCG/ACT nasal spray, SPRAY ONE TO TWO SPRAYS IN THE AFFECTED NOSTRIL DAILY AS NEEDED FOR ALLERGY SYMPTOMS AND CONGESTION, Disp: 48 g, Rfl: 3 .  furosemide (LASIX) 40 MG tablet, Take 1 tablet Daily for BP & Fluid Retention / Ankle Swelling, Disp: 90 tablet, Rfl: 3 .  Magnesium 250 MG TABS, Take 250 mg by mouth at bedtime. , Disp: , Rfl:  .  Multiple Vitamins-Minerals (MULTIVITAMIN PO), Take 1 tablet by mouth at bedtime. , Disp: , Rfl:  .  nystatin (NYSTATIN) powder, Apply topically 4 (four) times daily. (Patient taking differently: Apply 1 g topically 4 (four) times daily as needed (for infection). ), Disp: 15 g, Rfl: 0 .  phentermine (ADIPEX-P) 37.5 MG tablet, Take 1/2 to 1 tablet every Morning for Dieting & Weight Loss, Disp: 90 tablet, Rfl: 1 .  propranolol (INDERAL) 40 MG tablet, Take 1/2 to 1 tablet 3 x /day with Meals for Tremor, Disp: 90 tablet, Rfl: 3 .  rosuvastatin (CRESTOR) 20 MG tablet, Take 1 tablet daily for Cholesterol, Disp: 90 tablet, Rfl: 3 .  topiramate (TOPAMAX) 50 MG tablet, Take 1/2 to 1 tablet 2 x /day at Suppertime & Bedtime for Dieting & Weight Loss, Disp: 180 tablet, Rfl: 1   Exam: Current vital signs: BP (!) 153/112 (BP Location: Right Arm)   Pulse 78   Temp 97.7 F (36.5 C) (Oral)   Resp 14   SpO2 96%  Vital signs in last 24 hours: Temp:  [97.7 F (36.5 C)] 97.7 F (36.5 C) (12/10 1105) Pulse Rate:  [78] 78 (12/10 1105) Resp:  [14] 14 (12/10 1105) BP: (153)/(112) 153/112 (12/10 1105) SpO2:  [96 %] 96 % (12/10 1105)  ROS:    General ROS: negative for - chills, fatigue, fever, night sweats, weight gain or weight loss Psychological ROS: negative for -  behavioral disorder, hallucinations, memory difficulties, mood swings or suicidal ideation Ophthalmic ROS: negative for - blurry vision, double vision, eye pain or loss of vision ENT ROS: negative for - epistaxis, nasal discharge, oral lesions, sore throat, tinnitus or vertigo Respiratory ROS: negative for - cough, hemoptysis, shortness of breath or wheezing Cardiovascular ROS: Positive for - edema  Gastrointestinal ROS: negative for - abdominal pain, diarrhea, hematemesis, nausea/vomiting or stool incontinence Genito-Urinary ROS: negative for - dysuria, hematuria, incontinence or urinary frequency/urgency Musculoskeletal ROS: Positive for - joint swelling or muscular weakness Neurological ROS: as noted in HPI Dermatological ROS: Positive for  skin lesion in lower extremities especially on the right tibia secondary to significant edema   Physical  Exam  Constitutional: Obese Psych: Affect appropriate to situation Eyes: No scleral injection HENT: No OP obstrucion Head: Normocephalic.  Cardiovascular: Normal rate and regular rhythm.  Respiratory: Effort normal, non-labored breathing GI: Soft.  No distension. There is no tenderness.  Skin: Significant for plus pitting edema bilateral tibias, discoloration of hemosiderin staining bilateral lower extremities, ulcers on the right tibia secondary to/likely secondary to significant edema  Neuro: Mental Status: Patient is awake, alert, oriented to person, place, month, year, and situation. Patient is able to give a history however not a fully clear history thus review and notes was needed no signs of aphasia with tremulous voice Cranial Nerves: II: Visual Fields are full.  III,IV, VI: EOMI without ptosis or diplopia. Pupils equal, round and reactive to light V: Facial sensation is symmetric to temperature VII: Smile is a slight left facial droop VIII: hearing is intact to voice X: Palate elevates symmetrically XI: Shoulder shrug is  symmetric. XII: tongue is midline without atrophy or fasciculations.  Motor: Bilateral upper extremities 5/5, bilateral hip flexion, knee flexion 4/5, bilateral knee extension 4 -/5, bilateral plantar flexion 5/5 bilateral dorsiflexion 4/5.  She has 5/5 hip abduction, 4/5 hip abduction. Sensory: Patient has decreased light touch and temperature up to her knees, intact proprioception and decreased vibratory sensation at the ankle.   deep Tendon Reflexes: 2+ and symmetric in the biceps no right patellar reflex secondary to knee replacement, 2+ left patellar reflex and no ankle reflex Plantars: Mute bilaterally Cerebellar: FNF within normal limits  Labs I have reviewed labs in epic and the results pertinent to this consultation are:   CBC    Component Value Date/Time   WBC 7.1 12/08/2019 1215   RBC 4.34 12/08/2019 1215   HGB 13.2 12/08/2019 1215   HCT 42.0 12/08/2019 1215   PLT 265 12/08/2019 1215   MCV 96.8 12/08/2019 1215   MCH 30.4 12/08/2019 1215   MCHC 31.4 12/08/2019 1215   RDW 13.8 12/08/2019 1215   LYMPHSABS 1.4 12/08/2019 1215   MONOABS 0.7 12/08/2019 1215   EOSABS 0.4 12/08/2019 1215   BASOSABS 0.1 12/08/2019 1215    CMP     Component Value Date/Time   NA 137 12/08/2019 1215   NA 136 05/16/2013 1500   K 4.4 12/08/2019 1215   CL 103 12/08/2019 1215   CO2 23 12/08/2019 1215   GLUCOSE 100 (H) 12/08/2019 1215   BUN 15 12/08/2019 1215   BUN 26 05/16/2013 1500   CREATININE 1.54 (H) 12/08/2019 1215   CREATININE 1.18 (H) 10/27/2019 1359   CALCIUM 9.3 12/08/2019 1215   PROT 6.8 12/08/2019 1215   PROT 7.2 05/16/2013 1500   ALBUMIN 3.3 (L) 12/08/2019 1215   ALBUMIN 4.6 05/16/2013 1500   AST 23 12/08/2019 1215   ALT 18 12/08/2019 1215   ALKPHOS 105 12/08/2019 1215   BILITOT 0.4 12/08/2019 1215   GFRNONAA 34 (L) 12/08/2019 1215   GFRNONAA 47 (L) 10/27/2019 1359   GFRAA 39 (L) 12/08/2019 1215   GFRAA 54 (L) 10/27/2019 1359    Lipid Panel     Component Value  Date/Time   CHOL 174 10/27/2019 1359   TRIG 137 10/27/2019 1359   HDL 83 10/27/2019 1359   CHOLHDL 2.1 10/27/2019 1359   VLDL 25 04/01/2017 1508   LDLCALC 69 10/27/2019 1359     Imaging I have reviewed the images obtained:  CT-scan of the brain-no acute intracranial findings, chronic microvascular ischemic change and cerebral volume loss  MRI examination of  the brain-ordered  Etta Quill PA-C [facility] [phone] 12/08/2019, 1:44 PM    Assessment:  73 year old female with chronic history of gait instability, bilateral lower extremity peripheral neuropathy, bilateral lower extremity edema, Charcot-Marie-Tooth disease who now presents the hospital with a 3-week rapidly progressive lower extremity weakness to the point where she was walking with a cane and now she is unable to walk. 1.  Exam shows full strength in the upper extremities, lower extremities have bilateral 4+ pitting tibial edema.  Lower extremity weakness, lower extremity discomfort secondary to significant edema, discoloration deformity of the right foot greater than left. 2.  This is most likely a multifactorial issue.  This would include significant bilateral lower extremity edema, foot deformities, known CMT neuropathy, and likely deconditioning. 3.  Given the patient's rapid progression of weakness in the lower extremities however, will obtain thoracic and lumbar spine imaging by MRI to evaluate for any form of compression, or lesion of thoracic and lumbar spine. 4.  Looking back at previous labs on 10/27/2019 patient's TSH was 0.02.  For that reason we will also order a TSH as thyroid disease may be contributing to her weakness.  Recommendations: 1.  MRI of thoracic lumbar spine to evaluate for any spinal deformity and/or lesions 2.  Patient has significant lower extremity pitting edema that needs to be addressed and treated 3.  If TSH is significantly low will need to adjust and treat.  Also will need  a B12 and thiamine level.  4.  If MRI of lumbar and thoracic spine are negative, patient will need both physical therapy and follow-up with Dr. Krista Blue of [facility] neurolog[facility] to further evaluate with EMG nerve conduction study.  I have seen and examined the patient. I have formulated the assessment and recommendations. 73 year old female with progressive BLE weakness in the setting of Charcot-Marie-Tooth disease. Plan is to obtain MRI of lumbar and thoracic spine to rule out spinal cord pathology. May need outpatient EMG/NCS.  Electronically signed: Dr. Kerney Elbe

## 2019-12-08 NOTE — Care Management (Signed)
ED CM  Received call from [facility] ED Uit Secretary concerning consult, CM reviewing patient's record will follow up with EDP.

## 2019-12-12 LAB — VITAMIN B1: Vitamin B1 (Thiamine): 215.6 nmol/L — ABNORMAL HIGH (ref 66.5–200.0)

## 2019-12-20 ENCOUNTER — Other Ambulatory Visit: Payer: Self-pay | Admitting: Neurosurgery

## 2019-12-20 DIAGNOSIS — M4802 Spinal stenosis, cervical region: Secondary | ICD-10-CM

## 2019-12-29 ENCOUNTER — Other Ambulatory Visit: Payer: Self-pay | Admitting: Internal Medicine

## 2019-12-29 MED ORDER — CIPROFLOXACIN HCL 500 MG PO TABS: ORAL_TABLET | ORAL | 0 refills | Status: DC

## 2019-12-31 ENCOUNTER — Other Ambulatory Visit: Payer: Self-pay | Admitting: Internal Medicine

## 2019-12-31 NOTE — Progress Notes (Signed)
   Re: Tiffany [name]    (DOB 1946/03/30)   31 Dec 2019   Pursuant to conversation with patient's spouse - concetta guion      This very nice unfortunate 74 yo MWF with hx/o severe Lumbar stenosis has has a progressive downhill course with bilateral lower extremity weakness producing unstable gait & multiple falls for which husband always has to call EMS to assist him in getting her to bed. She is unable to transfer self from bed to wheel chair. In essence, she has become non ambulatory and because of generalized weakness, she is unable to position / reposition herself in an ordinary bed to alleviate pain or pressure breakdown of skin.      It is recommended that she have an electric hospital bed to allow frequent repositioning which cannot be achieved in an ordinary bed to alleviate pain or pressure.    Kirtland Bouchard, MD

## 2020-01-02 ENCOUNTER — Emergency Department (HOSPITAL_COMMUNITY): Payer: PPO

## 2020-01-02 ENCOUNTER — Other Ambulatory Visit: Payer: Self-pay

## 2020-01-02 ENCOUNTER — Inpatient Hospital Stay (HOSPITAL_COMMUNITY)
Admission: EM | Admit: 2020-01-02 | Discharge: 2020-01-11 | DRG: 071 | Disposition: A | Discharge status: Hospice | Payer: PPO | Attending: Internal Medicine | Admitting: Internal Medicine

## 2020-01-02 DIAGNOSIS — Z79899 Other long term (current) drug therapy: Secondary | ICD-10-CM

## 2020-01-02 DIAGNOSIS — E785 Hyperlipidemia, unspecified: Secondary | ICD-10-CM | POA: Diagnosis not present

## 2020-01-02 DIAGNOSIS — R1310 Dysphagia, unspecified: Secondary | ICD-10-CM | POA: Diagnosis not present

## 2020-01-02 DIAGNOSIS — Z6836 Body mass index (BMI) 36.0-36.9, adult: Secondary | ICD-10-CM | POA: Diagnosis not present

## 2020-01-02 DIAGNOSIS — S81811D Laceration without foreign body, right lower leg, subsequent encounter: Secondary | ICD-10-CM | POA: Diagnosis not present

## 2020-01-02 DIAGNOSIS — Z7189 Other specified counseling: Secondary | ICD-10-CM | POA: Diagnosis not present

## 2020-01-02 DIAGNOSIS — R251 Tremor, unspecified: Secondary | ICD-10-CM | POA: Diagnosis not present

## 2020-01-02 DIAGNOSIS — G629 Polyneuropathy, unspecified: Secondary | ICD-10-CM | POA: Diagnosis not present

## 2020-01-02 DIAGNOSIS — Z791 Long term (current) use of non-steroidal anti-inflammatories (NSAID): Secondary | ICD-10-CM

## 2020-01-02 DIAGNOSIS — R531 Weakness: Secondary | ICD-10-CM

## 2020-01-02 DIAGNOSIS — Z96611 Presence of right artificial shoulder joint: Secondary | ICD-10-CM | POA: Diagnosis present

## 2020-01-02 DIAGNOSIS — M19019 Primary osteoarthritis, unspecified shoulder: Secondary | ICD-10-CM | POA: Diagnosis not present

## 2020-01-02 DIAGNOSIS — Z87440 Personal history of urinary (tract) infections: Secondary | ICD-10-CM

## 2020-01-02 DIAGNOSIS — X58XXXD Exposure to other specified factors, subsequent encounter: Secondary | ICD-10-CM | POA: Diagnosis present

## 2020-01-02 DIAGNOSIS — E870 Hyperosmolality and hypernatremia: Secondary | ICD-10-CM | POA: Diagnosis not present

## 2020-01-02 DIAGNOSIS — M14671 Charcot's joint, right ankle and foot: Secondary | ICD-10-CM | POA: Diagnosis not present

## 2020-01-02 DIAGNOSIS — M48061 Spinal stenosis, lumbar region without neurogenic claudication: Secondary | ICD-10-CM | POA: Diagnosis present

## 2020-01-02 DIAGNOSIS — Z800 Family history of malignant neoplasm of digestive organs: Secondary | ICD-10-CM

## 2020-01-02 DIAGNOSIS — G9341 Metabolic encephalopathy: Principal | ICD-10-CM | POA: Diagnosis present

## 2020-01-02 DIAGNOSIS — Z981 Arthrodesis status: Secondary | ICD-10-CM | POA: Diagnosis not present

## 2020-01-02 DIAGNOSIS — E669 Obesity, unspecified: Secondary | ICD-10-CM | POA: Diagnosis present

## 2020-01-02 DIAGNOSIS — R627 Adult failure to thrive: Secondary | ICD-10-CM | POA: Diagnosis present

## 2020-01-02 DIAGNOSIS — M6281 Muscle weakness (generalized): Secondary | ICD-10-CM | POA: Diagnosis not present

## 2020-01-02 DIAGNOSIS — Z7982 Long term (current) use of aspirin: Secondary | ICD-10-CM | POA: Diagnosis not present

## 2020-01-02 DIAGNOSIS — Z20822 Contact with and (suspected) exposure to covid-19: Secondary | ICD-10-CM | POA: Diagnosis present

## 2020-01-02 DIAGNOSIS — E039 Hypothyroidism, unspecified: Secondary | ICD-10-CM | POA: Diagnosis not present

## 2020-01-02 DIAGNOSIS — B370 Candidal stomatitis: Secondary | ICD-10-CM | POA: Diagnosis present

## 2020-01-02 DIAGNOSIS — G9340 Encephalopathy, unspecified: Secondary | ICD-10-CM | POA: Diagnosis not present

## 2020-01-02 DIAGNOSIS — S42031D Displaced fracture of lateral end of right clavicle, subsequent encounter for fracture with routine healing: Secondary | ICD-10-CM | POA: Diagnosis not present

## 2020-01-02 DIAGNOSIS — Z803 Family history of malignant neoplasm of breast: Secondary | ICD-10-CM

## 2020-01-02 DIAGNOSIS — E063 Autoimmune thyroiditis: Secondary | ICD-10-CM | POA: Diagnosis present

## 2020-01-02 DIAGNOSIS — F0390 Unspecified dementia without behavioral disturbance: Secondary | ICD-10-CM | POA: Diagnosis not present

## 2020-01-02 DIAGNOSIS — E0590 Thyrotoxicosis, unspecified without thyrotoxic crisis or storm: Secondary | ICD-10-CM | POA: Diagnosis present

## 2020-01-02 DIAGNOSIS — R7303 Prediabetes: Secondary | ICD-10-CM | POA: Diagnosis present

## 2020-01-02 DIAGNOSIS — G8929 Other chronic pain: Secondary | ICD-10-CM | POA: Diagnosis not present

## 2020-01-02 DIAGNOSIS — Z96642 Presence of left artificial hip joint: Secondary | ICD-10-CM | POA: Diagnosis not present

## 2020-01-02 DIAGNOSIS — R4182 Altered mental status, unspecified: Secondary | ICD-10-CM

## 2020-01-02 DIAGNOSIS — M25519 Pain in unspecified shoulder: Secondary | ICD-10-CM | POA: Diagnosis not present

## 2020-01-02 DIAGNOSIS — E878 Other disorders of electrolyte and fluid balance, not elsewhere classified: Secondary | ICD-10-CM | POA: Diagnosis present

## 2020-01-02 DIAGNOSIS — I10 Essential (primary) hypertension: Secondary | ICD-10-CM | POA: Diagnosis present

## 2020-01-02 DIAGNOSIS — Z87891 Personal history of nicotine dependence: Secondary | ICD-10-CM | POA: Diagnosis not present

## 2020-01-02 DIAGNOSIS — I5031 Acute diastolic (congestive) heart failure: Secondary | ICD-10-CM | POA: Diagnosis not present

## 2020-01-02 DIAGNOSIS — R442 Other hallucinations: Secondary | ICD-10-CM | POA: Diagnosis not present

## 2020-01-02 DIAGNOSIS — Z85118 Personal history of other malignant neoplasm of bronchus and lung: Secondary | ICD-10-CM | POA: Diagnosis not present

## 2020-01-02 DIAGNOSIS — R339 Retention of urine, unspecified: Secondary | ICD-10-CM | POA: Diagnosis present

## 2020-01-02 DIAGNOSIS — R404 Transient alteration of awareness: Secondary | ICD-10-CM | POA: Diagnosis not present

## 2020-01-02 DIAGNOSIS — R410 Disorientation, unspecified: Secondary | ICD-10-CM | POA: Diagnosis not present

## 2020-01-02 DIAGNOSIS — M1712 Unilateral primary osteoarthritis, left knee: Secondary | ICD-10-CM | POA: Diagnosis not present

## 2020-01-02 DIAGNOSIS — Z66 Do not resuscitate: Secondary | ICD-10-CM | POA: Diagnosis not present

## 2020-01-02 DIAGNOSIS — Z96653 Presence of artificial knee joint, bilateral: Secondary | ICD-10-CM | POA: Diagnosis not present

## 2020-01-02 DIAGNOSIS — S42001D Fracture of unspecified part of right clavicle, subsequent encounter for fracture with routine healing: Secondary | ICD-10-CM

## 2020-01-02 DIAGNOSIS — R52 Pain, unspecified: Secondary | ICD-10-CM | POA: Diagnosis not present

## 2020-01-02 DIAGNOSIS — M25511 Pain in right shoulder: Secondary | ICD-10-CM | POA: Diagnosis not present

## 2020-01-02 DIAGNOSIS — R296 Repeated falls: Secondary | ICD-10-CM | POA: Diagnosis present

## 2020-01-02 DIAGNOSIS — I6782 Cerebral ischemia: Secondary | ICD-10-CM | POA: Diagnosis not present

## 2020-01-02 DIAGNOSIS — D649 Anemia, unspecified: Secondary | ICD-10-CM | POA: Diagnosis not present

## 2020-01-02 DIAGNOSIS — E559 Vitamin D deficiency, unspecified: Secondary | ICD-10-CM | POA: Diagnosis present

## 2020-01-02 DIAGNOSIS — I088 Other rheumatic multiple valve diseases: Secondary | ICD-10-CM | POA: Diagnosis not present

## 2020-01-02 DIAGNOSIS — G600 Hereditary motor and sensory neuropathy: Secondary | ICD-10-CM | POA: Diagnosis present

## 2020-01-02 DIAGNOSIS — Z8249 Family history of ischemic heart disease and other diseases of the circulatory system: Secondary | ICD-10-CM

## 2020-01-02 DIAGNOSIS — Z515 Encounter for palliative care: Secondary | ICD-10-CM | POA: Diagnosis not present

## 2020-01-02 DIAGNOSIS — S22089D Unspecified fracture of T11-T12 vertebra, subsequent encounter for fracture with routine healing: Secondary | ICD-10-CM

## 2020-01-02 DIAGNOSIS — Z9181 History of falling: Secondary | ICD-10-CM | POA: Diagnosis not present

## 2020-01-02 LAB — CBC WITH DIFFERENTIAL/PLATELET
Abs Immature Granulocytes: 0.03 10*3/uL (ref 0.00–0.07)
Basophils Absolute: 0.1 10*3/uL (ref 0.0–0.1)
Basophils Relative: 1 %
Eosinophils Absolute: 0.6 10*3/uL — ABNORMAL HIGH (ref 0.0–0.5)
Eosinophils Relative: 7 %
HCT: 43.6 % (ref 36.0–46.0)
Hemoglobin: 13.1 g/dL (ref 12.0–15.0)
Immature Granulocytes: 0 %
Lymphocytes Relative: 17 %
Lymphs Abs: 1.5 10*3/uL (ref 0.7–4.0)
MCH: 29.3 pg (ref 26.0–34.0)
MCHC: 30 g/dL (ref 30.0–36.0)
MCV: 97.5 fL (ref 80.0–100.0)
Monocytes Absolute: 0.8 10*3/uL (ref 0.1–1.0)
Monocytes Relative: 9 %
Neutro Abs: 5.7 10*3/uL (ref 1.7–7.7)
Neutrophils Relative %: 66 %
Platelets: 299 10*3/uL (ref 150–400)
RBC: 4.47 MIL/uL (ref 3.87–5.11)
RDW: 14.2 % (ref 11.5–15.5)
WBC: 8.7 10*3/uL (ref 4.0–10.5)
nRBC: 0 % (ref 0.0–0.2)

## 2020-01-02 LAB — COMPREHENSIVE METABOLIC PANEL
ALT: 24 U/L (ref 0–44)
AST: 25 U/L (ref 15–41)
Albumin: 3.5 g/dL (ref 3.5–5.0)
Alkaline Phosphatase: 110 U/L (ref 38–126)
Anion gap: 10 (ref 5–15)
BUN: 15 mg/dL (ref 8–23)
CO2: 26 mmol/L (ref 22–32)
Calcium: 9.7 mg/dL (ref 8.9–10.3)
Chloride: 106 mmol/L (ref 98–111)
Creatinine, Ser: 1.56 mg/dL — ABNORMAL HIGH (ref 0.44–1.00)
GFR calc Af Amer: 38 mL/min — ABNORMAL LOW (ref >60)
GFR calc non Af Amer: 33 mL/min — ABNORMAL LOW (ref >60)
Glucose, Bld: 105 mg/dL — ABNORMAL HIGH (ref 70–99)
Potassium: 4.2 mmol/L (ref 3.5–5.1)
Sodium: 142 mmol/L (ref 135–145)
Total Bilirubin: 0.6 mg/dL (ref 0.3–1.2)
Total Protein: 7.3 g/dL (ref 6.5–8.1)

## 2020-01-02 LAB — TROPONIN I (HIGH SENSITIVITY): Troponin I (High Sensitivity): 3 ng/L (ref <18)

## 2020-01-02 LAB — TSH: TSH: 0.142 u[IU]/mL — ABNORMAL LOW (ref 0.350–4.500)

## 2020-01-02 LAB — AMMONIA: Ammonia: 13 umol/L (ref 9–35)

## 2020-01-02 MED ORDER — ROSUVASTATIN CALCIUM 20 MG PO TABS
20.0000 mg | ORAL_TABLET | Freq: Every day | ORAL | Status: DC
  Administered 2020-01-03 – 2020-01-10 (×7): 20 mg via ORAL
  Filled 2020-01-02 (×8): qty 1

## 2020-01-02 MED ORDER — ACETAMINOPHEN 325 MG PO TABS
650.0000 mg | ORAL_TABLET | Freq: Four times a day (QID) | ORAL | Status: DC | PRN
  Administered 2020-01-03 – 2020-01-07 (×2): 650 mg via ORAL
  Filled 2020-01-02 (×2): qty 2

## 2020-01-02 MED ORDER — FAMOTIDINE 20 MG PO TABS
40.0000 mg | ORAL_TABLET | Freq: Every day | ORAL | Status: DC
  Administered 2020-01-03 – 2020-01-10 (×7): 40 mg via ORAL
  Filled 2020-01-02 (×8): qty 2

## 2020-01-02 MED ORDER — ASPIRIN EC 81 MG PO TBEC
81.0000 mg | DELAYED_RELEASE_TABLET | Freq: Every day | ORAL | Status: DC
  Administered 2020-01-03 – 2020-01-10 (×7): 81 mg via ORAL
  Filled 2020-01-02 (×8): qty 1

## 2020-01-02 MED ORDER — PROPRANOLOL HCL 40 MG PO TABS
40.0000 mg | ORAL_TABLET | Freq: Three times a day (TID) | ORAL | Status: DC
  Filled 2020-01-02 (×2): qty 1

## 2020-01-02 MED ORDER — ENALAPRIL MALEATE 10 MG PO TABS
10.0000 mg | ORAL_TABLET | Freq: Every day | ORAL | Status: DC
  Administered 2020-01-02 – 2020-01-04 (×3): 10 mg via ORAL
  Filled 2020-01-02 (×4): qty 1

## 2020-01-02 MED ORDER — TOPIRAMATE 25 MG PO TABS
50.0000 mg | ORAL_TABLET | ORAL | Status: DC
  Administered 2020-01-03: 50 mg via ORAL
  Filled 2020-01-02: qty 2

## 2020-01-02 MED ORDER — MAGNESIUM OXIDE 400 (241.3 MG) MG PO TABS
400.0000 mg | ORAL_TABLET | Freq: Every day | ORAL | Status: DC
  Administered 2020-01-02 – 2020-01-10 (×9): 400 mg via ORAL
  Filled 2020-01-02 (×9): qty 1

## 2020-01-02 MED ORDER — ENOXAPARIN SODIUM 40 MG/0.4ML ~~LOC~~ SOLN
40.0000 mg | SUBCUTANEOUS | Status: DC
  Administered 2020-01-03 – 2020-01-08 (×4): 40 mg via SUBCUTANEOUS
  Filled 2020-01-02 (×5): qty 0.4

## 2020-01-02 MED ORDER — LORAZEPAM 1 MG PO TABS
1.0000 mg | ORAL_TABLET | Freq: Once | ORAL | Status: AC
  Administered 2020-01-02: 1 mg via ORAL
  Filled 2020-01-02: qty 1

## 2020-01-02 MED ORDER — DIAZEPAM 5 MG PO TABS
5.0000 mg | ORAL_TABLET | Freq: Two times a day (BID) | ORAL | Status: DC
  Administered 2020-01-02 – 2020-01-05 (×6): 5 mg via ORAL
  Filled 2020-01-02 (×6): qty 1

## 2020-01-02 NOTE — H&P (Addendum)
History and Physical    Tiffany [name] MWU:[id number] DOB: 16-Aug-1946 DOA: 01/02/2020  PCP: Lucky Cowboy, MD  Patient coming from: Home  I have personally briefly reviewed patient's old medical records in [facility]  Chief Complaint: left shoulder pain and weakness   HPI: Tiffany [name] is a 74 y.o. female with medical history significant of history of lung cancer, Charcot-Marie-Tooth, tremors subclinical hypothyroidism, hypertension and hyperlipidemia who presents with concerns of right shoulder pain and worsening weakness. History is largely obtained by documentation as patient is unable to recall much history and timeline of her symptoms. Patient's husband was reportedly helping her to get out of bed today and heard a pop to her right shoulder and he also reported that she has been having altered mental status for the past month where she is only alert to self.  They then called her PCP who recommended he present here to get an MRI to rule out stroke.  Patient normally uses a wheelchair to ambulate because of severe peripheral neuropathy. She was recently evaluated on 12/10 for progressive lower leg weakness and had an MRI of the thoracic and lumbar spine that showed severe spinal stenosis at L4-L5.  She has since had follow-up with neurosurgery outpatient and reportedly has surgery planned but patient is not sure when that is. Aside from the weakness, patient reports that she was otherwise in her normal state of health.  Denies any fever.  Has good appetite.  Denies any nausea, vomiting or diarrhea.  She is alert and oriented to self, place and time but not current events at the time of my evaluation.  She is a former smoker, endorsed alcohol use about 2 glasses of wine 3 to 4 days out of the week.  Denies any illicit drug use.   ED Course: She was afebrile, hypertensive up to 170/99 on room air. CBC unremarkable. Sodium 142, potassium 4.2, glucose of 105, creatinine of 1.56 which  is elevated from 0.938 months ago. TSH of 0.142. CT head shows no acute abnormalities. MRI shows no acute intracranial abnormalities with mild to moderate chronic small vessel ischemic disease.  Right shoulder X-ray showed subacute healing fx of right distal clavicle and right shoulder replacement.   CXR negative.   Review of Systems:  Constitutional: No Weight Change, No Fever ENT/Mouth: No Rhinorrhea Eyes: No Vision Changes Cardiovascular: No Chest Pain, no SOB Respiratory: No Cough, No Sputum  Gastrointestinal: No Nausea, No Vomiting, No Diarrhea, No Constipation, No Pain Genitourinary: no dsyuria Musculoskeletal: No Arthralgias, No Myalgias Skin: No Skin Lesions, No Pruritus, Neuro: + Weakness, No Numbness,  Psych: No Anxiety/Panic, No Depression, no decrease appetite Heme/Lymph: No Bruising, No Bleeding  Past Medical History:  Diagnosis Date  . Anemia   . Arthritis   . Cancer (HCC)    lung cancer 2016  . Gait difficulty    problems with balance  . High blood pressure   . History of blood transfusion    05-14-15  . Hyperlipidemia   . Hyperthyroidism mild  . Neuropathy   . Neuropathy   . Obesity (BMI 35.0-39.9 without comorbidity)   . Pre-diabetes   . 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 Vitamin D deficiency   . Wears glasses   . Wears partial dentures     Past Surgical History:  Procedure Laterality Date  . CERVICAL DISC ARTHROPLASTY    . CRYO INTERCOSTAL NERVE BLOCK Left 05/09/2015   Procedure: CRYO INTERCOSTAL NERVE BLOCK;  Surgeon: Loreli Slot, MD;  Location: [facility] OR;  Service: Thoracic;  Laterality: Left;  . ESOPHAGOGASTRODUODENOSCOPY N/A 05/16/2015   Procedure: ESOPHAGOGASTRODUODENOSCOPY (EGD);  Surgeon: Hilarie Fredrickson, MD;  Location: [facility] ENDOSCOPY;  Service: Endoscopy;  Laterality: N/A;  possible ablation of a bleeding lesion  . knee replaced     right knee x 2  . LOBECTOMY Left 05/09/2015   Procedure: LEFT LUNG UPPER LOBECTOMY;   Surgeon: Loreli Slot, MD;  Location: [facility] OR;  Service: Thoracic;  Laterality: Left;  . LYMPH NODE DISSECTION Left 05/09/2015   Procedure: LYMPH NODE DISSECTION;  Surgeon: Loreli Slot, MD;  Location: [facility] OR;  Service: Thoracic;  Laterality: Left;  Marland Kitchen MULTIPLE TOOTH EXTRACTIONS    . REVERSE SHOULDER ARTHROPLASTY Right 02/10/2019   Procedure: RIGHT REVERSE SHOULDER ARTHROPLASTY;  Surgeon: Cammy Copa, MD;  Location: [facility] OR;  Service: Orthopedics;  Laterality: Right;  . ROTATOR CUFF REPAIR Right   . SPINAL FUSION    . TONSILLECTOMY  age 47  . TOTAL HIP ARTHROPLASTY Left 07/13/2015   Procedure: LEFT TOTAL HIP ARTHROPLASTY ANTERIOR APPROACH;  Surgeon: Kathryne Hitch, MD;  Location: [facility] ORS;  Service: Orthopedics;  Laterality: Left;  Marland Kitchen VAGINAL HYSTERECTOMY    . VIDEO ASSISTED THORACOSCOPY (VATS)/WEDGE RESECTION Left 05/09/2015   Procedure: LEFT VIDEO ASSISTED THORACOSCOPY (VATS) WITH LEFT LUNG UPPER LOBE WEDGE RESECTION;  Surgeon: Loreli Slot, MD;  Location: [facility] OR;  Service: Thoracic;  Laterality: Left;  Marland Kitchen VIDEO BRONCHOSCOPY WITH ENDOBRONCHIAL NAVIGATION N/A 04/18/2015   Procedure: VIDEO BRONCHOSCOPY WITH ENDOBRONCHIAL NAVIGATION;  Surgeon: Leslye Peer, MD;  Location: [facility] OR;  Service: Thoracic;  Laterality: N/A;  . VIDEO BRONCHOSCOPY WITH ENDOBRONCHIAL ULTRASOUND N/A 04/18/2015   Procedure: VIDEO BRONCHOSCOPY WITH ENDOBRONCHIAL ULTRASOUND;  Surgeon: Leslye Peer, MD;  Location: [facility] OR;  Service: Thoracic;  Laterality: N/A;     reports that she quit smoking about 4 years ago. Her smoking use included cigarettes. She has a 30.00 pack-year smoking history. She has never used smokeless tobacco. She reports current alcohol use of about 2.0 standard drinks of alcohol per week. She reports that she does not use drugs.  Allergies  Allergen Reactions  . Other Other (See Comments)    Stool softeners caused stomach bloating, nausea, vomiting bleeding ulcer and blood  transfusion    Family History  Problem Relation Age of Onset  . Heart attack Father   . Hypertension Mother   . Breast cancer Maternal Aunt   . Colon cancer Paternal Grandmother      Prior to Admission medications   Medication Sig Start Date End Date Taking? Authorizing Provider  aspirin EC 81 MG tablet Take 81 mg by mouth daily.   Yes [provider]  chlorpheniramine (CHLOR-TRIMETON) 4 MG tablet Take 4 mg by mouth 2 (two) times daily as needed for allergies.   Yes [provider]  Cholecalciferol (VITAMIN D3) 125 MCG (5000 UT) CAPS Take 5,000 Units by mouth 2 (two) times daily.   Yes [provider]  ciprofloxacin (CIPRO) 500 MG tablet Take 1 tablet 2 x /day with Food for UTI Patient taking differently: Take 500 mg by mouth 2 (two) times daily.  12/29/19  Yes Lucky Cowboy, MD  diazepam (VALIUM) 5 MG tablet Take 1 /2 to 1 tablet 3 x /day for tremor Patient taking differently: Take 5 mg by mouth 2 (two) times daily.  10/27/19  Yes Lucky Cowboy, MD  famotidine (PEPCID) 40 MG tablet Take 1 table  Daily to Prevent Indigestion & Heartburn Patient taking differently: Take 40 mg by mouth daily.  10/15/19  Yes Lucky Cowboy, MD  fluticasone (FLONASE) 50 MCG/ACT nasal spray SPRAY ONE TO TWO SPRAYS IN THE AFFECTED NOSTRIL DAILY AS NEEDED FOR ALLERGY SYMPTOMS AND CONGESTION Patient taking differently: Place 1-2 sprays into both nostrils daily as needed for allergies.  09/08/19  Yes Lucky Cowboy, MD  Magnesium 250 MG TABS Take 250 mg by mouth at bedtime.    Yes [provider]  meloxicam (MOBIC) 15 MG tablet Take 15 mg by mouth daily. 12/01/19  Yes [provider]  Multiple Vitamins-Minerals (MULTIVITAMIN PO) Take 1 tablet by mouth at bedtime.    Yes [provider]  phentermine (ADIPEX-P) 37.5 MG tablet Take 1/2 to 1 tablet every Morning for Dieting & Weight Loss Patient taking differently: Take 37.5 mg by mouth every morning.   10/27/19  Yes Lucky Cowboy, MD  propranolol (INDERAL) 40 MG tablet Take 1/2 to 1 tablet 3 x /day with Meals for Tremor Patient taking differently: Take 40 mg by mouth 3 (three) times daily. Meals for Tremor 11/07/19  Yes Lucky Cowboy, MD  rosuvastatin (CRESTOR) 20 MG tablet Take 1 tablet daily for Cholesterol Patient taking differently: Take 20 mg by mouth daily. Take 1 tablet daily for Cholesterol 04/09/19  Yes Lucky Cowboy, MD  topiramate (TOPAMAX) 50 MG tablet Take 1/2 to 1 tablet 2 x /day at Suppertime & Bedtime for Dieting & Weight Loss Patient taking differently: Take 50 mg by mouth See admin instructions. Suppertime & Bedtime for Dieting & Weight Loss 10/27/19  Yes Lucky Cowboy, MD  enalapril (VASOTEC) 20 MG tablet Take 1 tablet Daily for BP Patient not taking: Reported on 12/08/2019 08/19/19   Lucky Cowboy, MD  furosemide (LASIX) 40 MG tablet Take 1 tablet Daily for BP & Fluid Retention / Ankle Swelling Patient not taking: Reported on 12/08/2019 11/14/19   Lucky Cowboy, MD  methylPREDNISolone (MEDROL DOSEPAK) 4 MG TBPK tablet Follow package insert Patient not taking: Reported on 01/02/2020 12/08/19   Virgina Norfolk, DO  nystatin (NYSTATIN) powder Apply topically 4 (four) times daily. Patient not taking: Reported on 01/02/2020 11/17/17   Lucky Cowboy, MD    Physical Exam: Vitals:   01/02/20 1515 01/02/20 1539 01/02/20 1730 01/02/20 2012  BP: (!) 179/123 (!) 162/99 (!) 191/94 (!) 179/99  Pulse: 93 (!) 56 89 92  Resp: 18 20 20 18   Temp: (!) 97.5 F (36.4 C) 97.6 F (36.4 C)    TempSrc: Oral Oral    SpO2: 100% 98% 100% 98%    Constitutional: NAD, calm, comfortable, non-toxic appearing elderly female laying flat in bed Vitals:   01/02/20 1515 01/02/20 1539 01/02/20 1730 01/02/20 2012  BP: (!) 179/123 (!) 162/99 (!) 191/94 (!) 179/99  Pulse: 93 (!) 56 89 92  Resp: 18 20 20 18   Temp: (!) 97.5 F (36.4 C) 97.6 F (36.4 C)    TempSrc: Oral Oral    SpO2:  100% 98% 100% 98%   Eyes: PERRL, lids and conjunctivae normal ENMT: Mucous membranes are moist.  Neck: normal, supple Respiratory: clear to auscultation anteriorly-patient unable to roll over or sit up due to weakness for posterior exam, no wheezing, no crackles. Normal respiratory effort.  Cardiovascular: Regular rate and rhythm, no murmurs / rubs / gallops.  +2 pitting pretibial edema bilaterally. Abdomen: no tenderness, no masses palpated.  Bowel sounds positive.  Musculoskeletal: no clubbing / cyanosis. No obvious deformities or dislocation.  Normal  muscle tone. Skin: large area of bruising on upper extremities from frequent falls Neurologic: CN 2-12 grossly intact. Sensation intact.  Significant resting tremors of bilateral hand.  Strength 3/5 in all 4- difficult with extending right upper extremity.  Psychiatric: Normal judgment and insight. Alert and oriented to self, place and time but not current events.  Unable to recall much timeline around her symptoms and planned appointments.  Normal mood.    Labs on Admission: I have personally reviewed following labs and imaging studies  CBC: Recent Labs  Lab 01/02/20 1620  WBC 8.7  NEUTROABS 5.7  HGB 13.1  HCT 43.6  MCV 97.5  PLT 299   Basic Metabolic Panel: Recent Labs  Lab 01/02/20 1620  NA 142  K 4.2  CL 106  CO2 26  GLUCOSE 105*  BUN 15  CREATININE 1.56*  CALCIUM 9.7   GFR: CrCl cannot be calculated (Unknown ideal weight.). Liver Function Tests: Recent Labs  Lab 01/02/20 1620  AST 25  ALT 24  ALKPHOS 110  BILITOT 0.6  PROT 7.3  ALBUMIN 3.5   No results for input(s): LIPASE, AMYLASE in the last 168 hours. Recent Labs  Lab 01/02/20 1620  AMMONIA 13   Coagulation Profile: No results for input(s): INR, PROTIME in the last 168 hours. Cardiac Enzymes: No results for input(s): CKTOTAL, CKMB, CKMBINDEX, TROPONINI in the last 168 hours. BNP (last 3 results) No results for input(s): PROBNP in the last 8760  hours. HbA1C: No results for input(s): HGBA1C in the last 72 hours. CBG: No results for input(s): GLUCAP in the last 168 hours. Lipid Profile: No results for input(s): CHOL, HDL, LDLCALC, TRIG, CHOLHDL, LDLDIRECT in the last 72 hours. Thyroid Function Tests: Recent Labs    01/02/20 1620  TSH 0.142*   Anemia Panel: No results for input(s): VITAMINB12, FOLATE, FERRITIN, TIBC, IRON, RETICCTPCT in the last 72 hours. Urine analysis:    Component Value Date/Time   COLORURINE YELLOW 12/08/2019 1718   APPEARANCEUR HAZY (A) 12/08/2019 1718   LABSPEC 1.009 12/08/2019 1718   PHURINE 7.0 12/08/2019 1718   GLUCOSEU NEGATIVE 12/08/2019 1718   HGBUR NEGATIVE 12/08/2019 1718   BILIRUBINUR NEGATIVE 12/08/2019 1718   KETONESUR NEGATIVE 12/08/2019 1718   PROTEINUR NEGATIVE 12/08/2019 1718   UROBILINOGEN 0.2 05/07/2015 1605   NITRITE NEGATIVE 12/08/2019 1718   LEUKOCYTESUR MODERATE (A) 12/08/2019 1718    Radiological Exams on Admission: DG Chest 2 View  Result Date: 01/02/2020 CLINICAL DATA:  Right shoulder pain EXAM: CHEST - 2 VIEW COMPARISON:  03/27/2017 FINDINGS: Surgical clips left hilum from prior lobectomy.  No recurrent mass. No acute infiltrate or effusion.  Negative for heart failure Chronic left rib fractures. Subacute healing fracture distal right clavicle. Right shoulder replacement. IMPRESSION: No acute cardiopulmonary abnormality Healing fracture distal right clavicle. Electronically Signed   By: Marlan Palau M.D.   On: 01/02/2020 16:07   DG Shoulder Right  Result Date: 01/02/2020 CLINICAL DATA:  Right shoulder pain. EXAM: RIGHT SHOULDER - 2+ VIEW COMPARISON:  02/25/2019 FINDINGS: Right shoulder replacement.  Normal alignment. Fracture of the distal third of the clavicle. This has sclerotic margins suggesting a subacute fracture with partial healing. This was not present previously. IMPRESSION: Right shoulder replacement Subacute healing fracture right distal clavicle. Electronically  Signed   By: Marlan Palau M.D.   On: 01/02/2020 16:05   CT Head Wo Contrast  Result Date: 01/02/2020 CLINICAL DATA:  Encephalopathy EXAM: CT HEAD WITHOUT CONTRAST TECHNIQUE: Contiguous axial images were obtained from the  base of the skull through the vertex without intravenous contrast. COMPARISON:  CT head 12/08/2019 FINDINGS: Brain: Cerebral atrophy most prominent in the frontal lobes, stable. Negative for hydrocephalus. Chronic white matter ischemic changes stable. Negative for acute infarct, hemorrhage, mass. Vascular: Negative for hyperdense vessel Skull: Negative Sinuses/Orbits: Mucosal edema sphenoid sinus and right mastoid sinus. Remaining sinuses clear. No orbital lesion. Other: None IMPRESSION: Atrophy and chronic white matter ischemia. No acute abnormality and no change from the prior study. Electronically Signed   By: Marlan Palau M.D.   On: 01/02/2020 16:27   MR BRAIN WO CONTRAST  Result Date: 01/02/2020 CLINICAL DATA:  Altered mental status. EXAM: MRI HEAD WITHOUT CONTRAST TECHNIQUE: Multiplanar, multiecho pulse sequences of the brain and surrounding structures were obtained without intravenous contrast. COMPARISON:  Head CT 01/02/2020 FINDINGS: The study is intermittently moderately motion degraded. Brain: There is no evidence of acute infarct, intracranial hemorrhage, mass, midline shift, or extra-axial fluid collection. T2 hyperintensities in the cerebral white matter bilaterally are nonspecific but compatible with mild-to-moderate chronic small vessel ischemic disease. There is moderate cerebral atrophy. Vascular: Major intracranial vascular flow voids are preserved. Skull and upper cervical spine: No suspicious marrow lesion. Previous cervical spine fusion. Sinuses/Orbits: Unremarkable orbits. Trace right sphenoid sinus fluid. Moderately large right mastoid effusion. Other: None. IMPRESSION: 1. No acute intracranial abnormality. 2. Mild-to-moderate chronic small vessel ischemic disease  and moderate cerebral atrophy. Electronically Signed   By: Sebastian Ache M.D.   On: 01/02/2020 18:40   DG Humerus Right  Result Date: 01/02/2020 CLINICAL DATA:  Arm pain EXAM: RIGHT HUMERUS - 2+ VIEW COMPARISON:  None. FINDINGS: Right shoulder replacement without acute complication Fracture distal third of the clavicle with sclerotic margins and partial healing. IMPRESSION: Subacute healing fracture distal right clavicle Electronically Signed   By: Marlan Palau M.D.   On: 01/02/2020 16:06    EKG: Independently reviewed.   Assessment/Plan  Worsening weakness with hx of neuropathy/charcot's foot/severe spinal stenosis Has severe spinal stenosis of L4-L5 followed by neurosurgery- reportedly planned operation PT/OT- will possible need rehab placement since pt's husband is having difficulty caring for her  Reported AMS Reportedly patient was only oriented to self per husband report.  However she is alert and oriented x3 here.  She was noted to have a UTI and treated with Cipro several weeks ago which could have been the cause of her altered mental status at that point. CT head and MRI brain here negative.  Elevated creatinine Suspect this is becoming CKD as her GFR has gradually worsened for the past 2 months - could be due to uncontrolled HTN and she is on phentermine Has daily Mobic on med list- will d/c  Hypertension  Pt was documented to have postural hypotension on 12/22 and her propranolol and enalapril were discontinued at that point. Unsure if this was ever resumed.  Will resume here with enapril at lower dose of 10mg .   Right shoulder pain X-ray with no acute fracture. Hx of right shoulder replacement and subacute healing of clavicle PRN Tylenol for pain  Subclinical hyperthyroidism  TSH of 0.142, will check free T4   Tremors Continue propranolol and Valium for tremors  Obesity Continue Topamax for weight loss. Will hold phentermine as her BP is uncontrolled- do not  recommend resuming this until her blood pressure is under control.     DVT prophylaxis:.Lovenox Code Status:Full Family Communication: Plan discussed with patient at bedside  disposition Plan: Home with at least 2 midnight stays  Consults  called:  Admission status: inpatient   Forever Arechiga T Quindarrius Joplin DO [facility]   If 7PM-7AM, please contact night-coverage [email or web address] Password [facility]  01/02/2020, 8:38 PM

## 2020-01-02 NOTE — ED Provider Notes (Signed)
[facility] DEPT Provider Note   CSN: [id number] Arrival date & time: 01/02/20  1504     History Chief Complaint  Patient presents with  . Shoulder Pain    right    Tiffany [name] is a 74 y.o. female.  74 y/o female with history of prediabetes, obesity, neuropathy, hypothyroidism, hyperlipidemia, Charcot-Marie-Tooth disease (she states that her sister was diagnosed with CMT-1, but is unsure if she has received a formal diagnosis of her subtype of CMT), hypertension and gait difficulty.  Patient here with right shoulder pain after feeling a pop while her husband is helping her get out of bed today.  He has been lifting her from underneath her shoulders due to the pathologic shoulder.  She has had surgery on her shoulder remotely.  There was no fall.  Did not hit her head or lose consciousness.  Patient is nonambulatory at baseline uses no walker and wheelchair.  She has a history of CMT disease and gait difficulty.  There is also reported confusion over the past 1 month has been unable to care for her anymore.  Patient's PCP was apparently concerned about a stroke. Patient denies any headache, chest pain, shortness of breath.  No abdominal pain, nausea or vomiting.  She has weakness in her legs which is unchanged from baseline.  She denies any fevers.  No difficulty speaking or difficulty swallowing.  The history is provided by the patient and the EMS personnel. The history is limited by the condition of the patient.  Shoulder Pain Associated symptoms: fatigue   Associated symptoms: no fever        Past Medical History:  Diagnosis Date  . Anemia   . Arthritis   . Cancer ([place])    lung cancer 2016  . Gait difficulty    problems with balance  . High blood pressure   . History of blood transfusion    05-14-15  . Hyperlipidemia   . Hyperthyroidism mild  . Neuropathy   . Neuropathy   . Obesity (BMI 35.0-39.9 without comorbidity)   . Pre-diabetes   .  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 Vitamin D deficiency   . Wears glasses   . Wears partial dentures     Patient Active Problem List   Diagnosis Date Noted  . Shoulder arthritis 02/10/2019  . Other chronic pain 01/24/2019  . Sensory neuropathy 01/24/2019  . Senile purpura ([place]) 10/18/2018  . Unilateral primary osteoarthritis, left knee 07/21/2018  . Peripheral neuropathy ([place]) 08/27/2016  . GERD (gastroesophageal reflux disease) 03/27/2016  . Subclinical hyperthyroidism 08/18/2015  . Gastric ulcer due to nonsteroidal antiinflammatory drug (NSAID) therapy   . Lung cancer ([place]) 05/09/2015  . Peripheral edema 05/01/2015  . Abnormal glucose 07/18/2014  . Vitamin D deficiency 07/18/2014  . Medication management 07/18/2014  . Hyperlipidemia, mixed 04/04/2014  . Charcot's Right foot 06/15/2013  . Hypertension     Past Surgical History:  Procedure Laterality Date  . CERVICAL DISC ARTHROPLASTY    . CRYO INTERCOSTAL NERVE BLOCK Left 05/09/2015   Procedure: CRYO INTERCOSTAL NERVE BLOCK;  Surgeon: [name], MD;  Location: [facility];  Service: Thoracic;  Laterality: Left;  . ESOPHAGOGASTRODUODENOSCOPY N/A 05/16/2015   Procedure: ESOPHAGOGASTRODUODENOSCOPY (EGD);  Surgeon: Irene Shipper, MD;  Location[facility] ENDOSCOPY;  Service: Endoscopy;  Laterality: N/A;  possible ablation of a bleeding lesion  . knee replaced     right knee x 2  . LOBECTOMY Left 05/09/2015  Procedure: LEFT LUNG UPPER LOBECTOMY;  Surgeon: [name], MD;  Location: [facility];  Service: Thoracic;  Laterality: Left;  . LYMPH NODE DISSECTION Left 05/09/2015   Procedure: LYMPH NODE DISSECTION;  Surgeon: [name], MD;  Location: [facility];  Service: Thoracic;  Laterality: Left;  Marland Kitchen MULTIPLE TOOTH EXTRACTIONS    . REVERSE SHOULDER ARTHROPLASTY Right 02/10/2019   Procedure: RIGHT REVERSE SHOULDER ARTHROPLASTY;  Surgeon: Meredith Pel, MD;  Location: [facility];  Service: Orthopedics;   Laterality: Right;  . ROTATOR CUFF REPAIR Right   . SPINAL FUSION    . TONSILLECTOMY  age 75  . TOTAL HIP ARTHROPLASTY Left 07/13/2015   Procedure: LEFT TOTAL HIP ARTHROPLASTY ANTERIOR APPROACH;  Surgeon: Mcarthur Rossetti, MD;  Location: [facility];  Service: Orthopedics;  Laterality: Left;  Marland Kitchen VAGINAL HYSTERECTOMY    . VIDEO ASSISTED THORACOSCOPY (VATS)/WEDGE RESECTION Left 05/09/2015   Procedure: LEFT VIDEO ASSISTED THORACOSCOPY (VATS) WITH LEFT LUNG UPPER LOBE WEDGE RESECTION;  Surgeon: [name], MD;  Location: [facility];  Service: Thoracic;  Laterality: Left;  Marland Kitchen VIDEO BRONCHOSCOPY WITH ENDOBRONCHIAL NAVIGATION N/A 04/18/2015   Procedure: VIDEO BRONCHOSCOPY WITH ENDOBRONCHIAL NAVIGATION;  Surgeon: Collene Gobble, MD;  Location: [facility];  Service: Thoracic;  Laterality: N/A;  . VIDEO BRONCHOSCOPY WITH ENDOBRONCHIAL ULTRASOUND N/A 04/18/2015   Procedure: VIDEO BRONCHOSCOPY WITH ENDOBRONCHIAL ULTRASOUND;  Surgeon: Collene Gobble, MD;  Location: [facility];  Service: Thoracic;  Laterality: N/A;     OB History   No obstetric history on file.     Family History  Problem Relation Age of Onset  . Heart attack Father   . Hypertension Mother   . Breast cancer Maternal Aunt   . Colon cancer Paternal Grandmother     Social History   Tobacco Use  . Smoking status: Former Smoker    Packs/day: 1.00    Years: 30.00    Pack years: 30.00    Types: Cigarettes    Quit date: 05/08/2015    Years since quitting: 4.6  . Smokeless tobacco: Never Used  Substance Use Topics  . Alcohol use: Yes    Alcohol/week: 2.0 standard drinks    Types: 2 Glasses of wine per week    Comment: 2 glasses wine  daily  . Drug use: No    Home Medications Prior to Admission medications   Medication Sig Start Date End Date Taking? Authorizing Provider  aspirin EC 81 MG tablet Take 81 mg by mouth daily.    [provider]  chlorpheniramine (CHLOR-TRIMETON) 4 MG tablet Take 4 mg by mouth 2 (two) times daily  as needed for allergies.    [provider]  Cholecalciferol (VITAMIN D3) 125 MCG (5000 UT) CAPS Take 5,000 Units by mouth 2 (two) times daily.    [provider]  ciprofloxacin (CIPRO) 500 MG tablet Take 1 tablet 2 x /day with Food for UTI 12/29/19   Unk Pinto, MD  diazepam (VALIUM) 5 MG tablet Take 1 /2 to 1 tablet 3 x /day for tremor Patient taking differently: Take 5 mg by mouth 3 (three) times daily. tremor 10/27/19   Unk Pinto, MD  enalapril (VASOTEC) 20 MG tablet Take 1 tablet Daily for BP Patient not taking: Reported on 12/08/2019 08/19/19   Unk Pinto, MD  famotidine (PEPCID) 40 MG tablet Take 1 table Daily to Prevent Indigestion & Heartburn 10/15/19   Unk Pinto, MD  fluticasone (FLONASE) 50 MCG/ACT nasal spray SPRAY ONE TO TWO SPRAYS IN  THE AFFECTED NOSTRIL DAILY AS NEEDED FOR ALLERGY SYMPTOMS AND CONGESTION Patient taking differently: Place 1-2 sprays into both nostrils daily as needed for allergies.  09/08/19   Unk Pinto, MD  furosemide (LASIX) 40 MG tablet Take 1 tablet Daily for BP & Fluid Retention / Ankle Swelling Patient not taking: Reported on 12/08/2019 11/14/19   Unk Pinto, MD  Magnesium 250 MG TABS Take 250 mg by mouth at bedtime.     [provider]  meloxicam (MOBIC) 15 MG tablet Take 15 mg by mouth daily. 12/01/19   [provider]  methylPREDNISolone (MEDROL DOSEPAK) 4 MG TBPK tablet Follow package insert 12/08/19   Curatolo, Adam, DO  Multiple Vitamins-Minerals (MULTIVITAMIN PO) Take 1 tablet by mouth at bedtime.     [provider]  nystatin (NYSTATIN) powder Apply topically 4 (four) times daily. Patient taking differently: Apply 1 g topically 4 (four) times daily as needed (for infection).  11/17/17   Unk Pinto, MD  phentermine (ADIPEX-P) 37.5 MG tablet Take 1/2 to 1 tablet every Morning for Dieting & Weight Loss Patient taking differently: Take 37.5 mg by mouth every morning.   10/27/19   Unk Pinto, MD  propranolol (INDERAL) 40 MG tablet Take 1/2 to 1 tablet 3 x /day with Meals for Tremor Patient taking differently: Take 40 mg by mouth 3 (three) times daily. Meals for Tremor 11/07/19   Unk Pinto, MD  rosuvastatin (CRESTOR) 20 MG tablet Take 1 tablet daily for Cholesterol Patient taking differently: Take 20 mg by mouth daily. Take 1 tablet daily for Cholesterol 04/09/19   Unk Pinto, MD  topiramate (TOPAMAX) 50 MG tablet Take 1/2 to 1 tablet 2 x /day at Suppertime & Bedtime for Dieting & Weight Loss Patient taking differently: Take 50 mg by mouth See admin instructions. Suppertime & Bedtime for Dieting & Weight Loss 10/27/19   Unk Pinto, MD    Allergies    Other  Review of Systems   Review of Systems  Constitutional: Positive for fatigue. Negative for activity change, appetite change, chills and fever.  HENT: Negative for congestion and rhinorrhea.   Respiratory: Negative for cough, chest tightness and shortness of breath.   Cardiovascular: Positive for leg swelling.  Gastrointestinal: Negative for abdominal pain, nausea and vomiting.  Genitourinary: Negative for dysuria and hematuria.  Musculoskeletal: Positive for arthralgias and myalgias.  Neurological: Positive for weakness. Negative for dizziness and headaches.    all other systems are negative except as noted in the HPI and PMH.   Physical Exam Updated Vital Signs BP (!) 179/123 (BP Location: Left Arm)   Pulse 93   Temp (!) 97.5 F (36.4 C) (Oral)   Resp 18   SpO2 100%   Physical Exam Vitals and nursing note reviewed.  Constitutional:      General: She is not in acute distress.    Appearance: She is well-developed. She is obese.     Comments: Chronically ill-appearing  HENT:     Head: Normocephalic and atraumatic.     Mouth/Throat:     Pharynx: No oropharyngeal exudate.  Eyes:     Conjunctiva/sclera: Conjunctivae normal.     Pupils: Pupils are equal, round, and  reactive to light.  Neck:     Comments: No meningismus. Cardiovascular:     Rate and Rhythm: Normal rate and regular rhythm.     Heart sounds: Normal heart sounds. No murmur.  Pulmonary:     Effort: Pulmonary effort is normal. No respiratory distress.  Breath sounds: Normal breath sounds.  Abdominal:     Palpations: Abdomen is soft.     Tenderness: There is no abdominal tenderness. There is no guarding or rebound.  Musculoskeletal:        General: Swelling and tenderness present. No deformity. Normal range of motion.     Cervical back: Normal range of motion and neck supple.     Right lower leg: Edema present.     Left lower leg: Edema present.     Comments: Surgical scar in the right shoulder.  There are some tenderness without deformity.  Range of motion secondary to pain. intact Radial pulse  Edema to legs bilaterally.  She is able to raise them off the bed weakly.  Purple Discoloration to legs bilaterally with edema.  Intact PT pulses with Doppler.  Skin:    General: Skin is warm.     Capillary Refill: Capillary refill takes less than 2 seconds.  Neurological:     Mental Status: She is alert and oriented to person, place, and time.     Cranial Nerves: No cranial nerve deficit.     Motor: No abnormal muscle tone.     Coordination: Coordination normal.     Comments: 5/5 strength in upper extremities Cranial nerves II to XII intact  Difficulty raising of legs bilaterally. Ankle flexion extension intact. Able to flex and extend knees bilaterally  Psychiatric:        Behavior: Behavior normal.     ED Results / Procedures / Treatments   Labs (all labs ordered are listed, but only abnormal results are displayed) Labs Reviewed  CBC WITH DIFFERENTIAL/PLATELET - Abnormal; Notable for the following components:      Result Value   Eosinophils Absolute 0.6 (*)    All other components within normal limits  COMPREHENSIVE METABOLIC PANEL - Abnormal; Notable for the following  components:   Glucose, Bld 105 (*)    Creatinine, Ser 1.56 (*)    GFR calc non Af Amer 33 (*)    GFR calc Af Amer 38 (*)    All other components within normal limits  TSH - Abnormal; Notable for the following components:   TSH 0.142 (*)    All other components within normal limits  SARS CORONAVIRUS 2 (TAT 6-24 HRS)  AMMONIA  URINALYSIS, ROUTINE W REFLEX MICROSCOPIC  BASIC METABOLIC PANEL  CBC  T4, FREE  TROPONIN I (HIGH SENSITIVITY)  TROPONIN I (HIGH SENSITIVITY)    EKG EKG Interpretation  Date/Time:  Monday January 02 2020 15:57:48 EST Ventricular Rate:  89 PR Interval:  162 QRS Duration: 80 QT Interval:  390 QTC Calculation: 474 R Axis:   1 Text Interpretation: Normal sinus rhythm Nonspecific ST abnormality Abnormal ECG No significant change was found Confirmed by Ezequiel Essex [phone]) on 01/02/2020 4:14:25 PM   Radiology DG Chest 2 View  Result Date: 01/02/2020 CLINICAL DATA:  Right shoulder pain EXAM: CHEST - 2 VIEW COMPARISON:  03/27/2017 FINDINGS: Surgical clips left hilum from prior lobectomy.  No recurrent mass. No acute infiltrate or effusion.  Negative for heart failure Chronic left rib fractures. Subacute healing fracture distal right clavicle. Right shoulder replacement. IMPRESSION: No acute cardiopulmonary abnormality Healing fracture distal right clavicle. Electronically Signed   By: Franchot Gallo M.D.   On: 01/02/2020 16:07   DG Shoulder Right  Result Date: 01/02/2020 CLINICAL DATA:  Right shoulder pain. EXAM: RIGHT SHOULDER - 2+ VIEW COMPARISON:  02/25/2019 FINDINGS: Right shoulder replacement.  Normal alignment. Fracture of the  distal third of the clavicle. This has sclerotic margins suggesting a subacute fracture with partial healing. This was not present previously. IMPRESSION: Right shoulder replacement Subacute healing fracture right distal clavicle. Electronically Signed   By: Franchot Gallo M.D.   On: 01/02/2020 16:05   CT Head Wo Contrast  Result  Date: 01/02/2020 CLINICAL DATA:  Encephalopathy EXAM: CT HEAD WITHOUT CONTRAST TECHNIQUE: Contiguous axial images were obtained from the base of the skull through the vertex without intravenous contrast. COMPARISON:  CT head 12/08/2019 FINDINGS: Brain: Cerebral atrophy most prominent in the frontal lobes, stable. Negative for hydrocephalus. Chronic white matter ischemic changes stable. Negative for acute infarct, hemorrhage, mass. Vascular: Negative for hyperdense vessel Skull: Negative Sinuses/Orbits: Mucosal edema sphenoid sinus and right mastoid sinus. Remaining sinuses clear. No orbital lesion. Other: None IMPRESSION: Atrophy and chronic white matter ischemia. No acute abnormality and no change from the prior study. Electronically Signed   By: Franchot Gallo M.D.   On: 01/02/2020 16:27   MR BRAIN WO CONTRAST  Result Date: 01/02/2020 CLINICAL DATA:  Altered mental status. EXAM: MRI HEAD WITHOUT CONTRAST TECHNIQUE: Multiplanar, multiecho pulse sequences of the brain and surrounding structures were obtained without intravenous contrast. COMPARISON:  Head CT 01/02/2020 FINDINGS: The study is intermittently moderately motion degraded. Brain: There is no evidence of acute infarct, intracranial hemorrhage, mass, midline shift, or extra-axial fluid collection. T2 hyperintensities in the cerebral white matter bilaterally are nonspecific but compatible with mild-to-moderate chronic small vessel ischemic disease. There is moderate cerebral atrophy. Vascular: Major intracranial vascular flow voids are preserved. Skull and upper cervical spine: No suspicious marrow lesion. Previous cervical spine fusion. Sinuses/Orbits: Unremarkable orbits. Trace right sphenoid sinus fluid. Moderately large right mastoid effusion. Other: None. IMPRESSION: 1. No acute intracranial abnormality. 2. Mild-to-moderate chronic small vessel ischemic disease and moderate cerebral atrophy. Electronically Signed   By: Logan Bores M.D.   On:  01/02/2020 18:40   DG Humerus Right  Result Date: 01/02/2020 CLINICAL DATA:  Arm pain EXAM: RIGHT HUMERUS - 2+ VIEW COMPARISON:  None. FINDINGS: Right shoulder replacement without acute complication Fracture distal third of the clavicle with sclerotic margins and partial healing. IMPRESSION: Subacute healing fracture distal right clavicle Electronically Signed   By: Franchot Gallo M.D.   On: 01/02/2020 16:06    Procedures Procedures (including critical care time)  Medications Ordered in ED Medications - No data to display  ED Course  I have reviewed the triage vital signs and the nursing notes.  Pertinent labs & imaging results that were available during my care of the patient were reviewed by me and considered in my medical decision making (see chart for details).    MDM Rules/Calculators/A&P                     \Patient from home with right shoulder pain after being helped up by her husband.  Husband reports increased confusion and unable to care for her at home.  Recently evaluated in the ED for leg weakness and had MRI of her T and L-spine which were unrevealing for acute pathology did show compression fracture at T12 as well as bulging of T11/12 disc.  Also showed spinal stenosis at L4/5.  Shoulder x-rays negative for fracture or dislocation.  Does show subacute healing clavicle fracture  Labs at baseline with mildy elevated for her creatinine.  Unable to give a urine sample  CT head obtained at request of PCP which is negative for acute pathology and subsequent MRI  brain did not show any infarct.  Discussed with patient's husband by phone.  He states he cannot care for her at home because she is generally weak and cannot help transfer herself at all.  They have home health several days a week but he reports this level of help.  He is very worried that cannot be cared properly for at home. He believes that she is confused and not been herself for several weeks.  Patient is  oriented x3 here however.  She states she does not want to go to rehab and wants to go home.  Discussed with the patient and her husband cannot care for her.  She has difficulty walking and difficulty with transfers.  She would benefit from PT and OT evaluation probable rehab placement.  She may need further evaluation by neurology.  Agreeable to overnight observation admission.  Discussed with Dr. Flossie Buffy. Final Clinical Impression(s) / ED Diagnoses Final diagnoses:  Acute pain of right shoulder  Altered mental status, unspecified altered mental status type    Rx / DC Orders ED Discharge Orders    None       Antoin Dargis, Annie Main, MD 01/02/20 2325

## 2020-01-02 NOTE — ED Notes (Signed)
HOspitalist at pt bedside

## 2020-01-02 NOTE — ED Notes (Signed)
Patient transported to MRI 

## 2020-01-02 NOTE — ED Notes (Signed)
This nurse and nurse tech transported pt to restroom to attain urine sample, pt sat for greater than 20 minutes and was unable to produce a sample.

## 2020-01-02 NOTE — ED Notes (Signed)
Patient transported to X-ray 

## 2020-01-02 NOTE — ED Notes (Signed)
Pts husband called but no answer, will try back at a later time

## 2020-01-02 NOTE — ED Triage Notes (Signed)
Per [facility] pt from home for right shoulder pains. Husband was helping her get off bed today and felt it pop. Pt had shoulder surgery on it but EMS unsure how long ago that was.  Also having AMS where she is alert to self only for a month.  Husband is saying he isnt able to care for patient anymore.  Husband told EMS he called PCP and they advised patient get a MRI to make sure she hasnt had stroke. Pt is not weight bearing and not ambulatory.

## 2020-01-03 ENCOUNTER — Inpatient Hospital Stay (HOSPITAL_COMMUNITY): Payer: PPO

## 2020-01-03 ENCOUNTER — Encounter (HOSPITAL_COMMUNITY): Payer: Self-pay | Admitting: Family Medicine

## 2020-01-03 DIAGNOSIS — I5031 Acute diastolic (congestive) heart failure: Secondary | ICD-10-CM

## 2020-01-03 LAB — CBC
HCT: 43.1 % (ref 36.0–46.0)
Hemoglobin: 12.7 g/dL (ref 12.0–15.0)
MCH: 29.3 pg (ref 26.0–34.0)
MCHC: 29.5 g/dL — ABNORMAL LOW (ref 30.0–36.0)
MCV: 99.3 fL (ref 80.0–100.0)
Platelets: 273 10*3/uL (ref 150–400)
RBC: 4.34 MIL/uL (ref 3.87–5.11)
RDW: 14 % (ref 11.5–15.5)
WBC: 9.2 10*3/uL (ref 4.0–10.5)
nRBC: 0 % (ref 0.0–0.2)

## 2020-01-03 LAB — BASIC METABOLIC PANEL
Anion gap: 13 (ref 5–15)
BUN: 13 mg/dL (ref 8–23)
CO2: 21 mmol/L — ABNORMAL LOW (ref 22–32)
Calcium: 9.2 mg/dL (ref 8.9–10.3)
Chloride: 106 mmol/L (ref 98–111)
Creatinine, Ser: 1.34 mg/dL — ABNORMAL HIGH (ref 0.44–1.00)
GFR calc Af Amer: 46 mL/min — ABNORMAL LOW (ref >60)
GFR calc non Af Amer: 40 mL/min — ABNORMAL LOW (ref >60)
Glucose, Bld: 90 mg/dL (ref 70–99)
Potassium: 3.8 mmol/L (ref 3.5–5.1)
Sodium: 140 mmol/L (ref 135–145)

## 2020-01-03 LAB — ECHOCARDIOGRAM COMPLETE
Height: 67 in
Weight: 3746.06 oz

## 2020-01-03 LAB — SARS CORONAVIRUS 2 (TAT 6-24 HRS): SARS Coronavirus 2: NEGATIVE

## 2020-01-03 LAB — T4, FREE: Free T4: 0.91 ng/dL (ref 0.61–1.12)

## 2020-01-03 LAB — TROPONIN I (HIGH SENSITIVITY): Troponin I (High Sensitivity): 5 ng/L (ref <18)

## 2020-01-03 MED ORDER — ORAL CARE MOUTH RINSE
15.0000 mL | Freq: Two times a day (BID) | OROMUCOSAL | Status: DC
  Administered 2020-01-03 – 2020-01-11 (×16): 15 mL via OROMUCOSAL

## 2020-01-03 MED ORDER — RISPERIDONE 1 MG PO TBDP
1.0000 mg | ORAL_TABLET | Freq: Two times a day (BID) | ORAL | Status: DC | PRN
  Administered 2020-01-04 (×2): 1 mg via ORAL
  Filled 2020-01-03 (×3): qty 1

## 2020-01-03 MED ORDER — LIP MEDEX EX OINT
TOPICAL_OINTMENT | CUTANEOUS | Status: AC
  Filled 2020-01-03: qty 7

## 2020-01-03 MED ORDER — NYSTATIN 100000 UNIT/ML MT SUSP
5.0000 mL | Freq: Four times a day (QID) | OROMUCOSAL | Status: DC
  Administered 2020-01-03 – 2020-01-11 (×19): 500000 [IU] via OROMUCOSAL
  Filled 2020-01-03 (×21): qty 5

## 2020-01-03 MED ORDER — PROPRANOLOL HCL 20 MG PO TABS
20.0000 mg | ORAL_TABLET | Freq: Three times a day (TID) | ORAL | Status: DC
  Administered 2020-01-03 – 2020-01-10 (×18): 20 mg via ORAL
  Filled 2020-01-03 (×30): qty 1

## 2020-01-03 NOTE — ED Notes (Signed)
ED TO INPATIENT HANDOFF REPORT  ED Nurse Name and Phone #: Jeoffrey Massed Name/Age/Gender Tiffany [name] 74 y.o. female Room/Bed: WOTF/NONE  Code Status   Code Status: Full Code  Home/SNF/Other Home Patient oriented to: self and situation Is this baseline? Yes   Triage Complete: Triage complete  Chief Complaint Weakness [R53.1]  Triage Note Per [facility] pt from home for right shoulder pains. Husband was helping her get off bed today and felt it pop. Pt had shoulder surgery on it but EMS unsure how long ago that was.  Also having AMS where she is alert to self only for a month.  Husband is saying he isnt able to care for patient anymore.  Husband told EMS he called PCP and they advised patient get a MRI to make sure she hasnt had stroke. Pt is not weight bearing and not ambulatory.      Allergies Allergies  Allergen Reactions  . Other Other (See Comments)    Stool softeners caused stomach bloating, nausea, vomiting bleeding ulcer and blood transfusion    Level of Care/Admitting Diagnosis ED Disposition    ED Disposition Condition Comment   Admit  Hospital Area: [facility] [[id number]]  Level of Care: Med-Surg [16]  Covid Evaluation: Asymptomatic Screening Protocol (No Symptoms)  Diagnosis: Weakness [094709]  Admitting Physician: Orene Desanctis [[id number]]  Attending Physician: Orene Desanctis [[id number]]  Estimated length of stay: past midnight tomorrow  Certification:: I certify this patient will need inpatient services for at least 2 midnights       B Medical/Surgery History Past Medical History:  Diagnosis Date  . Anemia   . Arthritis   . Cancer ([place])    lung cancer 2016  . Gait difficulty    problems with balance  . High blood pressure   . History of blood transfusion    05-14-15  . Hyperlipidemia   . Hyperthyroidism mild  . Neuropathy   . Neuropathy   . Obesity (BMI 35.0-39.9 without comorbidity)   . Pre-diabetes   . 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 Vitamin D deficiency   . Wears glasses   . Wears partial dentures    Past Surgical History:  Procedure Laterality Date  . CERVICAL DISC ARTHROPLASTY    . CRYO INTERCOSTAL NERVE BLOCK Left 05/09/2015   Procedure: CRYO INTERCOSTAL NERVE BLOCK;  Surgeon: [name], MD;  Location: [facility];  Service: Thoracic;  Laterality: Left;  . ESOPHAGOGASTRODUODENOSCOPY N/A 05/16/2015   Procedure: ESOPHAGOGASTRODUODENOSCOPY (EGD);  Surgeon: Irene Shipper, MD;  Location: [facility] ENDOSCOPY;  Service: Endoscopy;  Laterality: N/A;  possible ablation of a bleeding lesion  . knee replaced     right knee x 2  . LOBECTOMY Left 05/09/2015   Procedure: LEFT LUNG UPPER LOBECTOMY;  Surgeon: [name], MD;  Location: [place];  Service: Thoracic;  Laterality: Left;  . LYMPH NODE DISSECTION Left 05/09/2015   Procedure: LYMPH NODE DISSECTION;  Surgeon: [name], MD;  Location: [place];  Service: Thoracic;  Laterality: Left;  Marland Kitchen MULTIPLE TOOTH EXTRACTIONS    . REVERSE SHOULDER ARTHROPLASTY Right 02/10/2019   Procedure: RIGHT REVERSE SHOULDER ARTHROPLASTY;  Surgeon: Meredith Pel, MD;  Location: [place];  Service: Orthopedics;  Laterality: Right;  . ROTATOR CUFF REPAIR Right   . SPINAL FUSION    . TONSILLECTOMY  age 75  . TOTAL HIP ARTHROPLASTY Left 07/13/2015   Procedure: LEFT TOTAL HIP ARTHROPLASTY ANTERIOR APPROACH;  Surgeon: Mcarthur Rossetti, MD;  Location: [facility];  Service: Orthopedics;  Laterality: Left;  Marland Kitchen VAGINAL HYSTERECTOMY    . VIDEO ASSISTED THORACOSCOPY (VATS)/WEDGE RESECTION Left 05/09/2015   Procedure: LEFT VIDEO ASSISTED THORACOSCOPY (VATS) WITH LEFT LUNG UPPER LOBE WEDGE RESECTION;  Surgeon: [name], MD;  Location: [place];  Service: Thoracic;  Laterality: Left;  Marland Kitchen VIDEO BRONCHOSCOPY WITH ENDOBRONCHIAL NAVIGATION N/A 04/18/2015   Procedure: VIDEO BRONCHOSCOPY WITH ENDOBRONCHIAL NAVIGATION;  Surgeon: Collene Gobble, MD;  Location: [place];  Service:  Thoracic;  Laterality: N/A;  . VIDEO BRONCHOSCOPY WITH ENDOBRONCHIAL ULTRASOUND N/A 04/18/2015   Procedure: VIDEO BRONCHOSCOPY WITH ENDOBRONCHIAL ULTRASOUND;  Surgeon: Collene Gobble, MD;  Location: [place];  Service: Thoracic;  Laterality: N/A;     A IV Location/Drains/Wounds Patient Lines/Drains/Airways Status   Active Line/Drains/Airways    Name:   Placement date:   Placement time:   Site:   Days:   Incision (Closed) 07/13/15 Hip Left   07/13/15    0909     1635   Incision (Closed) 02/10/19 Shoulder Right   02/10/19    1653     327          Intake/Output Last 24 hours No intake or output data in the 24 hours ending 01/03/20 0115  Labs/Imaging Results for orders placed or performed during the hospital encounter of 01/02/20 (from the past 48 hour(s))  CBC with Differential     Status: Abnormal   Collection Time: 01/02/20  4:20 PM  Result Value Ref Range   WBC 8.7 4.0 - 10.5 K/uL   RBC 4.47 3.87 - 5.11 MIL/uL   Hemoglobin 13.1 12.0 - 15.0 g/dL   HCT 43.6 36.0 - 46.0 %   MCV 97.5 80.0 - 100.0 fL   MCH 29.3 26.0 - 34.0 pg   MCHC 30.0 30.0 - 36.0 g/dL   RDW 14.2 11.5 - 15.5 %   Platelets 299 150 - 400 K/uL   nRBC 0.0 0.0 - 0.2 %   Neutrophils Relative % 66 %   Neutro Abs 5.7 1.7 - 7.7 K/uL   Lymphocytes Relative 17 %   Lymphs Abs 1.5 0.7 - 4.0 K/uL   Monocytes Relative 9 %   Monocytes Absolute 0.8 0.1 - 1.0 K/uL   Eosinophils Relative 7 %   Eosinophils Absolute 0.6 (H) 0.0 - 0.5 K/uL   Basophils Relative 1 %   Basophils Absolute 0.1 0.0 - 0.1 K/uL   Immature Granulocytes 0 %   Abs Immature Granulocytes 0.03 0.00 - 0.07 K/uL    Comment: Performed at [facility], [place] [address] [place], [place] [address]  Comprehensive metabolic panel     Status: Abnormal   Collection Time: 01/02/20  4:20 PM  Result Value Ref Range   Sodium 142 135 - 145 mmol/L   Potassium 4.2 3.5 - 5.1 mmol/L   Chloride 106 98 - 111 mmol/L   CO2 26 22 - 32 mmol/L   Glucose, Bld 105  (H) 70 - 99 mg/dL   BUN 15 8 - 23 mg/dL   Creatinine, Ser 1.56 (H) 0.44 - 1.00 mg/dL   Calcium 9.7 8.9 - 10.3 mg/dL   Total Protein 7.3 6.5 - 8.1 g/dL   Albumin 3.5 3.5 - 5.0 g/dL   AST 25 15 - 41 U/L   ALT 24 0 - 44 U/L   Alkaline Phosphatase 110 38 - 126 U/L   Total Bilirubin 0.6 0.3 - 1.2 mg/dL  GFR calc non Af Amer 33 (L) >60 mL/min   GFR calc Af Amer 38 (L) >60 mL/min   Anion gap 10 5 - 15    Comment: Performed at [facility], [place] [address], [place], [place] [address]  Troponin I (High Sensitivity)     Status: None   Collection Time: 01/02/20  4:20 PM  Result Value Ref Range   Troponin I (High Sensitivity) 3 <18 ng/L    Comment: (NOTE) Elevated high sensitivity troponin I (hsTnI) values and significant  changes across serial measurements may suggest ACS but many other  chronic and acute conditions are known to elevate hsTnI results.  Refer to the "Links" section for chest pain algorithms and additional  guidance. Performed at [facility], [place] [address] [place], [place] [address]   Ammonia     Status: None   Collection Time: 01/02/20  4:20 PM  Result Value Ref Range   Ammonia 13 9 - 35 umol/L    Comment: Performed at [facility], [place] [address], [place], [place] [address]  TSH     Status: Abnormal   Collection Time: 01/02/20  4:20 PM  Result Value Ref Range   TSH 0.142 (L) 0.350 - 4.500 uIU/mL    Comment: Performed by a 3rd Generation assay with a functional sensitivity of <=0.01 uIU/mL. Performed at [facility], [place] [address], [place], [place] [address]   Troponin I (High Sensitivity)     Status: None   Collection Time: 01/02/20  5:31 PM  Result Value Ref Range   Troponin I (High Sensitivity) 5 <18 ng/L    Comment: (NOTE) Elevated high sensitivity troponin I (hsTnI) values and significant  changes across serial measurements may suggest ACS but many other  chronic and acute  conditions are known to elevate hsTnI results.  Refer to the "Links" section for chest pain algorithms and additional  guidance. Performed at [facility], [place] [address], [place], [place] [address]    DG Chest 2 View  Result Date: 01/02/2020 CLINICAL DATA:  Right shoulder pain EXAM: CHEST - 2 VIEW COMPARISON:  03/27/2017 FINDINGS: Surgical clips left hilum from prior lobectomy.  No recurrent mass. No acute infiltrate or effusion.  Negative for heart failure Chronic left rib fractures. Subacute healing fracture distal right clavicle. Right shoulder replacement. IMPRESSION: No acute cardiopulmonary abnormality Healing fracture distal right clavicle. Electronically Signed   By: Franchot Gallo M.D.   On: 01/02/2020 16:07   DG Shoulder Right  Result Date: 01/02/2020 CLINICAL DATA:  Right shoulder pain. EXAM: RIGHT SHOULDER - 2+ VIEW COMPARISON:  02/25/2019 FINDINGS: Right shoulder replacement.  Normal alignment. Fracture of the distal third of the clavicle. This has sclerotic margins suggesting a subacute fracture with partial healing. This was not present previously. IMPRESSION: Right shoulder replacement Subacute healing fracture right distal clavicle. Electronically Signed   By: Franchot Gallo M.D.   On: 01/02/2020 16:05   CT Head Wo Contrast  Result Date: 01/02/2020 CLINICAL DATA:  Encephalopathy EXAM: CT HEAD WITHOUT CONTRAST TECHNIQUE: Contiguous axial images were obtained from the base of the skull through the vertex without intravenous contrast. COMPARISON:  CT head 12/08/2019 FINDINGS: Brain: Cerebral atrophy most prominent in the frontal lobes, stable. Negative for hydrocephalus. Chronic white matter ischemic changes stable. Negative for acute infarct, hemorrhage, mass. Vascular: Negative for hyperdense vessel Skull: Negative Sinuses/Orbits: Mucosal edema sphenoid sinus and right mastoid sinus. Remaining sinuses clear. No orbital lesion. Other: None  IMPRESSION: Atrophy and  chronic white matter ischemia. No acute abnormality and no change from the prior study. Electronically Signed   By: Franchot Gallo M.D.   On: 01/02/2020 16:27   MR BRAIN WO CONTRAST  Result Date: 01/02/2020 CLINICAL DATA:  Altered mental status. EXAM: MRI HEAD WITHOUT CONTRAST TECHNIQUE: Multiplanar, multiecho pulse sequences of the brain and surrounding structures were obtained without intravenous contrast. COMPARISON:  Head CT 01/02/2020 FINDINGS: The study is intermittently moderately motion degraded. Brain: There is no evidence of acute infarct, intracranial hemorrhage, mass, midline shift, or extra-axial fluid collection. T2 hyperintensities in the cerebral white matter bilaterally are nonspecific but compatible with mild-to-moderate chronic small vessel ischemic disease. There is moderate cerebral atrophy. Vascular: Major intracranial vascular flow voids are preserved. Skull and upper cervical spine: No suspicious marrow lesion. Previous cervical spine fusion. Sinuses/Orbits: Unremarkable orbits. Trace right sphenoid sinus fluid. Moderately large right mastoid effusion. Other: None. IMPRESSION: 1. No acute intracranial abnormality. 2. Mild-to-moderate chronic small vessel ischemic disease and moderate cerebral atrophy. Electronically Signed   By: Logan Bores M.D.   On: 01/02/2020 18:40   DG Humerus Right  Result Date: 01/02/2020 CLINICAL DATA:  Arm pain EXAM: RIGHT HUMERUS - 2+ VIEW COMPARISON:  None. FINDINGS: Right shoulder replacement without acute complication Fracture distal third of the clavicle with sclerotic margins and partial healing. IMPRESSION: Subacute healing fracture distal right clavicle Electronically Signed   By: Franchot Gallo M.D.   On: 01/02/2020 16:06    Pending Labs Unresulted Labs (From admission, onward)    Start     Ordered   01/03/20 5631  Basic metabolic panel  Tomorrow morning,   R     01/02/20 2037   01/03/20 0500  CBC  Tomorrow morning,   R     01/02/20 2037    01/02/20 2106  T4, free  Once,   STAT     01/02/20 2105   01/02/20 1913  SARS CORONAVIRUS 2 (TAT 6-24 HRS) Nasopharyngeal Nasopharyngeal Swab  (Tier 3 (TAT 6-24 hrs))  Once,   STAT    Question Answer Comment  Is this test for diagnosis or screening Screening   Symptomatic for COVID-19 as defined by CDC No   Hospitalized for COVID-19 No   Admitted to ICU for COVID-19 No   Previously tested for COVID-19 Yes   Resident in a congregate (group) care setting No   Employed in healthcare setting No   Pregnant No      01/02/20 1912   01/02/20 1531  Urinalysis, Routine w reflex microscopic  Once,   STAT     01/02/20 1530          Vitals/Pain Today's Vitals   01/02/20 2116 01/02/20 2306 01/03/20 0007 01/03/20 0057  BP: (!) 160/116 (!) 156/91 (!) 157/84 (!) 152/86  Pulse: 89 (!) 102 99 (!) 102  Resp: 18 20 18 18   Temp:      TempSrc:      SpO2: 98% 99% 99% 99%  PainSc:        Isolation Precautions No active isolations  Medications Medications  enoxaparin (LOVENOX) injection 40 mg (has no administration in time range)  aspirin EC tablet 81 mg (has no administration in time range)  propranolol (INDERAL) tablet 40 mg (has no administration in time range)  rosuvastatin (CRESTOR) tablet 20 mg (has no administration in time range)  topiramate (TOPAMAX) tablet 50 mg (50 mg Oral Given 01/03/20 0008)  magnesium oxide (MAG-OX) tablet 400 mg (400 mg Oral  Given 01/02/20 2219)  famotidine (PEPCID) tablet 40 mg (has no administration in time range)  diazepam (VALIUM) tablet 5 mg (5 mg Oral Given 01/02/20 2219)  acetaminophen (TYLENOL) tablet 650 mg (has no administration in time range)  enalapril (VASOTEC) tablet 10 mg (10 mg Oral Given 01/02/20 2215)  LORazepam (ATIVAN) tablet 1 mg (1 mg Oral Given 01/02/20 1743)    Mobility non-ambulatory High fall risk   Focused Assessments Cardiac Assessment Handoff:    Lab Results  Component Value Date   CKTOTAL 252 (H) 05/16/2013   No results found  for: DDIMER Does the Patient currently have chest pain? No  , Pulmonary Assessment Handoff:  Lung sounds:   O2 Device: Room Air        R Recommendations: See Admitting Provider Note  Report given to:   Additional Notes:  Peripheral vascular checks

## 2020-01-03 NOTE — Evaluation (Signed)
Occupational Therapy Evaluation Patient Details Name: AZELL GASCHLER MRN: [id number] DOB: October 19, 1946 Today's Date: 01/03/2020    History of Present Illness 74 year old female admitted for weakness. She has had AMS x 1 month, [place] and uses w/c, h/o R RTSA 2/20, subacute distal R clavicle fx, arthritis, lung CA, neuropathy, HTN and multiple orthopedic sxs   Clinical Impression   Pt was admitted for the above. Per chart, she has had AMS x 1 month, needed assist for adls and transfers to w/c from husband and has had multiple falls.  Of note, pt was also admitted with R shoulder pain. She has a subacute distal clavicle fx and h/o RTSA on this side last February. Will follow in acute setting with the goals listed below. She will need snf for rehab.    Follow Up Recommendations  SNF    Equipment Recommendations  None recommended by OT    Recommendations for Other Services       Precautions / Restrictions Precautions Precautions: Fall Precaution Comments: multiple falls, sling, R distal clavicle fx Restrictions Weight Bearing Restrictions: Yes Other Position/Activity Restrictions: distal clavicle fx      Mobility Bed Mobility               General bed mobility comments: total +2 for all bed mobility  Transfers                 General transfer comment: unable    Balance Overall balance assessment: Needs assistance Sitting-balance support: Feet supported;Single extremity supported Sitting balance-Leahy Scale: Poor                                     ADL either performed or assessed with clinical judgement   ADL Overall ADL's : Needs assistance/impaired Eating/Feeding: Supervision/ safety;Set up   Grooming: Minimal assistance   Upper Body Bathing: Maximal assistance   Lower Body Bathing: Total assistance;+2 for physical assistance   Upper Body Dressing : Maximal assistance   Lower Body Dressing: Total assistance;+2 for physical  assistance                 General ADL Comments: based on clinical judgment. Pt did not want to eat.  Unable to maintain static sitting unassisted; tended to lean posteriorly and to L.  Poor attention to task     Vision         Perception     Praxis      Pertinent Vitals/Pain Pain Assessment: Faces Faces Pain Scale: Hurts whole lot Pain Location: R shoulder Pain Intervention(s): Limited activity within patient's tolerance;Monitored during session;Repositioned     Hand Dominance Right   Extremity/Trunk Assessment Upper Extremity Assessment Upper Extremity Assessment: RUE deficits/detail;Generalized weakness RUE Deficits / Details: able to move elbow wrist and fingers, AAROM painful at 30 FF, tolerated 30 ABD and gravity eliminated rotation to neutral   Lower Extremity Assessment Lower Extremity Assessment: (multiple wounds)       Communication Communication Communication: No difficulties   Cognition Arousal/Alertness: Awake/alert Behavior During Therapy: WFL for tasks assessed/performed Overall Cognitive Status: No family/caregiver present to determine baseline cognitive functioning                                 General Comments: h/o AMS x 1 month per chart.  Focused attention, decreased memory, extra time to follow commands--most  one step [facility]   General Comments       Exercises     Shoulder Instructions      Home Living Family/patient expects to be discharged to:: Skilled nursing facility                                 Additional Comments: was at home with husband      Prior Functioning/Environment          Comments: needed assistance for mobility and adls. Multiple falls        OT Problem List: Decreased strength;Decreased range of motion;Decreased activity tolerance;Impaired balance (sitting and/or standing);Decreased cognition;Decreased safety awareness;Pain;Impaired UE functional use      OT  Treatment/Interventions: Self-care/ADL training;Therapeutic exercise;DME and/or AE instruction;Therapeutic activities;Patient/family education;Balance training;Cognitive remediation/compensation    OT Goals(Current goals can be found in the care plan section) Acute Rehab OT Goals Patient Stated Goal: home OT Goal Formulation: Patient unable to participate in goal setting Time For Goal Achievement: 01/17/20 Potential to Achieve Goals: Fair ADL Goals Additional ADL Goal #1: pt will perform self feeding with set up and grooming with min A for set up at supervision level with min cues per activity Additional ADL Goal #2: pt will perform gentle RUE lap slides to increase strength and ROM for adls Additional ADL Goal #3: pt will maintain static sitting for 2 minutes at min guard level for adls  OT Frequency: Min 2X/week   Barriers to D/C:            Co-evaluation PT/OT/SLP Co-Evaluation/Treatment: Yes Reason for Co-Treatment: Complexity of the patient's impairments (multi-system involvement);To address functional/ADL transfers PT goals addressed during session: Mobility/safety with mobility;Balance OT goals addressed during session: ADL's and self-care;Strengthening/ROM      AM-PAC OT "6 Clicks" Daily Activity     Outcome Measure Help from another person eating meals?: A Little Help from another person taking care of personal grooming?: A Little Help from another person toileting, which includes using toliet, bedpan, or urinal?: Total Help from another person bathing (including washing, rinsing, drying)?: A Lot Help from another person to put on and taking off regular upper body clothing?: A Lot Help from another person to put on and taking off regular lower body clothing?: A Lot 6 Click Score: 13   End of Session    Activity Tolerance: Patient limited by pain Patient left: in bed;with call bell/phone within reach;with bed alarm set  OT Visit Diagnosis: Muscle weakness (generalized)  (M62.81);History of falling (Z91.81);Repeated falls (R29.6);Unsteadiness on feet (R26.81);Cognitive communication deficit (R41.841)                Time: 4098-1191 OT Time Calculation (min): 19 min Charges:  OT General Charges $OT Visit: 1 Visit OT Evaluation $OT Eval Moderate Complexity: 1 Mod  Bjorn Hallas S, OTR/L Acute Rehabilitation Services 01/03/2020  Rayana Geurin 01/03/2020, 2:25 PM

## 2020-01-03 NOTE — Progress Notes (Signed)
PROGRESS NOTE    Tiffany [name]  ZOX:[id number] DOB: June 07, 1946 DOA: 01/02/2020 PCP: Unk Pinto, MD    Brief Narrative:  Tiffany [name] is a 74 y.o. female with medical history significant of history of lung cancer, Charcot-Marie-Tooth, tremors subclinical hypothyroidism, hypertension and hyperlipidemia presents with worsening right shoulder pain and worsening weakness. Also noted to have worsening mentation per husband.    Assessment & Plan:   Principal Problem:   Weakness Active Problems:   Hypertension   Charcot's Right foot   Subclinical hyperthyroidism   Obesity   Sensory neuropathy   Shoulder arthritis   Tremor   Worsening weakness with hx of neuropathy/charcot's foot/severe spinal stenosis Has severe spinal stenosis of L4-L5 followed by neurosurgery- reportedly planned operation PT/OT recommend SNF- will possible need rehab placement since pt's husband is having difficulty caring for her  Altered mentation  Per husband, patient's mentation has been declining rapidly in the last 3 weeks. She was found to have a UTI during this time which was treated with ciprofloxacin. It appears patient's mentation was declining prior to ciprofloxacin.   Structural abnormality ruled out: CT head and MRI brain here negative. TSH low (has been low since 2014), free T4 normal. Do not think this is contributing her altered mentation since this has been a chronic issue.  ?dementia vs drug side effect (?phentermine)  Sitter at bedside as she is not redirectable verbally and is getting out of bed  Risperidone ODT PRN for agitation that is not verbally redirectable   Elevated creatinine Suspect this is becoming CKD as her GFR has gradually worsened for the past 2 months - could be due to uncontrolled HTN and she is on phentermine Mobic, phentermine discontinued   Hypertension  Pt was documented to have postural hypotension on 12/22 and her propranolol and enalapril were discontinued  at that point. Unsure if this was ever resumed.  Will resume here with enapril at lower dose of 10mg .   Right shoulder pain X-ray with no acute fracture. Hx of right shoulder replacement and subacute healing of clavicle PRN Tylenol for pain  Subclinical hyperthyroidism  TSH has been low since 2014.  TSH of 0.142, free T4 normal   Tremors Continue propranolol and Valium for tremors  Obesity Discontinue topamax, phentermine due to their propensity to cause cognitive dysfunction/ CNS side effects    DVT prophylaxis: Lovenox SQ Code Status: Full code  Family Communication: updated husband on plan of care via telephone, he is unable to care for the patient at home and would like her to be placed. Will place [facility] consult Disposition Plan: will likely need SNF placement when medically ready    Consultants:   None   Procedures:  None   Antimicrobials:   None     Subjective: Reports doing fine. Does not know why she is on hospital.   Objective: Vitals:   01/03/20 0057 01/03/20 0159 01/03/20 0238 01/03/20 0607  BP: (!) 152/86 (!) 149/85  136/81  Pulse: (!) 102 (!) 101  93  Resp: 18 18    Temp:  98.1 F (36.7 C)  98.7 F (37.1 C)  TempSrc:  Oral  Oral  SpO2: 99% 93%  95%  Weight:   106.2 kg   Height:   5\' 7"  (1.702 m)    No intake or output data in the 24 hours ending 01/03/20 0733 Filed Weights   01/03/20 0238  Weight: 106.2 kg    Examination:  General exam: Appears calm and comfortable  Respiratory system: Clear to auscultation. Respiratory effort normal. Cardiovascular system: S1 & S2 heard, RRR. No JVD, murmurs.  Gastrointestinal system: Abdomen is nondistended, soft and nontender. Normal bowel sounds heard. Central nervous system: Alert, oriented to self, not place or time. Bilateral LE reduced strength due to ?deconditioning  Extremities: Significant bilateral pitting edema, tender on palpation. Skin: No rashes, lesions or ulcers Psychiatry: mood is  okay, laughs intermittently when asked questions   Data Reviewed: I have personally reviewed following labs and imaging studies  CBC: Recent Labs  Lab 01/02/20 1620 01/03/20 0229  WBC 8.7 9.2  NEUTROABS 5.7  --   HGB 13.1 12.7  HCT 43.6 43.1  MCV 97.5 99.3  PLT 299 194   Basic Metabolic Panel: Recent Labs  Lab 01/02/20 1620 01/03/20 0229  NA 142 140  K 4.2 3.8  CL 106 106  CO2 26 21*  GLUCOSE 105* 90  BUN 15 13  CREATININE 1.56* 1.34*  CALCIUM 9.7 9.2   GFR: Estimated Creatinine Clearance: 48.3 mL/min (A) (by C-G formula based on SCr of 1.34 mg/dL (H)). Liver Function Tests: Recent Labs  Lab 01/02/20 1620  AST 25  ALT 24  ALKPHOS 110  BILITOT 0.6  PROT 7.3  ALBUMIN 3.5   No results for input(s): LIPASE, AMYLASE in the last 168 hours. Recent Labs  Lab 01/02/20 1620  AMMONIA 13   Coagulation Profile: No results for input(s): INR, PROTIME in the last 168 hours. Cardiac Enzymes: No results for input(s): CKTOTAL, CKMB, CKMBINDEX, TROPONINI in the last 168 hours. BNP (last 3 results) No results for input(s): PROBNP in the last 8760 hours. HbA1C: No results for input(s): HGBA1C in the last 72 hours. CBG: No results for input(s): GLUCAP in the last 168 hours. Lipid Profile: No results for input(s): CHOL, HDL, LDLCALC, TRIG, CHOLHDL, LDLDIRECT in the last 72 hours. Thyroid Function Tests: Recent Labs    01/02/20 1620 01/02/20 2106  TSH 0.142*  --   FREET4  --  0.91   Anemia Panel: No results for input(s): VITAMINB12, FOLATE, FERRITIN, TIBC, IRON, RETICCTPCT in the last 72 hours. Sepsis Labs: No results for input(s): PROCALCITON, LATICACIDVEN in the last 168 hours.  No results found for this or any previous visit (from the past 240 hour(s)).     Radiology Studies: DG Chest 2 View  Result Date: 01/02/2020 CLINICAL DATA:  Right shoulder pain EXAM: CHEST - 2 VIEW COMPARISON:  03/27/2017 FINDINGS: Surgical clips left hilum from prior lobectomy.  No  recurrent mass. No acute infiltrate or effusion.  Negative for heart failure Chronic left rib fractures. Subacute healing fracture distal right clavicle. Right shoulder replacement. IMPRESSION: No acute cardiopulmonary abnormality Healing fracture distal right clavicle. Electronically Signed   By: Franchot Gallo M.D.   On: 01/02/2020 16:07   DG Shoulder Right  Result Date: 01/02/2020 CLINICAL DATA:  Right shoulder pain. EXAM: RIGHT SHOULDER - 2+ VIEW COMPARISON:  02/25/2019 FINDINGS: Right shoulder replacement.  Normal alignment. Fracture of the distal third of the clavicle. This has sclerotic margins suggesting a subacute fracture with partial healing. This was not present previously. IMPRESSION: Right shoulder replacement Subacute healing fracture right distal clavicle. Electronically Signed   By: Franchot Gallo M.D.   On: 01/02/2020 16:05   CT Head Wo Contrast  Result Date: 01/02/2020 CLINICAL DATA:  Encephalopathy EXAM: CT HEAD WITHOUT CONTRAST TECHNIQUE: Contiguous axial images were obtained from the base of the skull through the vertex without intravenous contrast. COMPARISON:  CT head 12/08/2019 FINDINGS:  Brain: Cerebral atrophy most prominent in the frontal lobes, stable. Negative for hydrocephalus. Chronic white matter ischemic changes stable. Negative for acute infarct, hemorrhage, mass. Vascular: Negative for hyperdense vessel Skull: Negative Sinuses/Orbits: Mucosal edema sphenoid sinus and right mastoid sinus. Remaining sinuses clear. No orbital lesion. Other: None IMPRESSION: Atrophy and chronic white matter ischemia. No acute abnormality and no change from the prior study. Electronically Signed   By: Franchot Gallo M.D.   On: 01/02/2020 16:27   MR BRAIN WO CONTRAST  Result Date: 01/02/2020 CLINICAL DATA:  Altered mental status. EXAM: MRI HEAD WITHOUT CONTRAST TECHNIQUE: Multiplanar, multiecho pulse sequences of the brain and surrounding structures were obtained without intravenous contrast.  COMPARISON:  Head CT 01/02/2020 FINDINGS: The study is intermittently moderately motion degraded. Brain: There is no evidence of acute infarct, intracranial hemorrhage, mass, midline shift, or extra-axial fluid collection. T2 hyperintensities in the cerebral white matter bilaterally are nonspecific but compatible with mild-to-moderate chronic small vessel ischemic disease. There is moderate cerebral atrophy. Vascular: Major intracranial vascular flow voids are preserved. Skull and upper cervical spine: No suspicious marrow lesion. Previous cervical spine fusion. Sinuses/Orbits: Unremarkable orbits. Trace right sphenoid sinus fluid. Moderately large right mastoid effusion. Other: None. IMPRESSION: 1. No acute intracranial abnormality. 2. Mild-to-moderate chronic small vessel ischemic disease and moderate cerebral atrophy. Electronically Signed   By: Logan Bores M.D.   On: 01/02/2020 18:40   DG Humerus Right  Result Date: 01/02/2020 CLINICAL DATA:  Arm pain EXAM: RIGHT HUMERUS - 2+ VIEW COMPARISON:  None. FINDINGS: Right shoulder replacement without acute complication Fracture distal third of the clavicle with sclerotic margins and partial healing. IMPRESSION: Subacute healing fracture distal right clavicle Electronically Signed   By: Franchot Gallo M.D.   On: 01/02/2020 16:06        Scheduled Meds: . aspirin EC  81 mg Oral Daily  . diazepam  5 mg Oral BID  . enalapril  10 mg Oral Daily  . enoxaparin (LOVENOX) injection  40 mg Subcutaneous Q24H  . famotidine  40 mg Oral Daily  . magnesium oxide  400 mg Oral QHS  . propranolol  40 mg Oral TID WC  . rosuvastatin  20 mg Oral Daily  . topiramate  50 mg Oral 2 times per day   Continuous Infusions:   LOS: 1 day    Time spent: Spent more than 30 minutes in coordinating care for this patient including bedside patient care.    Lucky Cowboy, MD [facility] If 7PM-7AM, please contact night-coverage 01/03/2020, 7:33 AM

## 2020-01-03 NOTE — Progress Notes (Signed)
Patient has had 38mL urinary output from 1900 - 2300. RN notified On call physician.   Orders received: In and Out cath q6h PRN

## 2020-01-03 NOTE — Progress Notes (Signed)
Patient's husband called. Patient's husband has concerns about not being able to care for patient at home and has requested for patient to get rehab for shoulder. Will put in case management consult. Patient's husband left the following contact information if there are any questions/concerns. Home number: [phone] Cell number: [phone]

## 2020-01-03 NOTE — Evaluation (Signed)
Physical Therapy Evaluation Patient Details Name: Tiffany [name] MRN: [id number] DOB: [birth year] Today's Date: 01/03/2020   History of Present Illness  74 year old female admitted for weakness. She has had AMS x 1 month, charcot-Marie and uses w/c, h/o R RTSA 2/20, subacute distal R clavicle fx, arthritis, lung CA, neuropathy, HTN and multiple orthopedic sxs  Clinical Impression  Pt admitted with above diagnosis.  PT pleasant and confused,  Currently requiring +2 total assist for bed mobility. Recommend SNF Pt currently with functional limitations due to the deficits listed below (see PT Problem List). Pt will benefit from skilled PT to increase their independence and safety with mobility to allow discharge to the venue listed below.       Follow Up Recommendations SNF    Equipment Recommendations  None recommended by PT    Recommendations for Other Services       Precautions / Restrictions Precautions Precautions: Fall Precaution Comments: multiple falls, sling, R distal clavicle fx Required Braces or Orthoses: Sling(sling found under pt in bed, pt reports she had sling at home, unclear historian) Restrictions Weight Bearing Restrictions: Yes Other Position/Activity Restrictions: R subacute distal clavicle fx      Mobility  Bed Mobility Overal bed mobility: Needs Assistance Bed Mobility: Rolling;Supine to Sit;Sit to Supine Rolling: +2 for physical assistance;Total assist;Max assist;+2 for safety/equipment   Supine to sit: Total assist;+2 for physical assistance;+2 for safety/equipment Sit to supine: +2 for physical assistance;+2 for safety/equipment;Total assist   General bed mobility comments: assist with trunk and LEs in both directions  Transfers                 General transfer comment: unable  Ambulation/Gait             General Gait Details: non-amb at baseline  Stairs            Wheelchair Mobility    Modified Rankin (Stroke Patients  Only)       Balance Overall balance assessment: Needs assistance Sitting-balance support: Feet supported;Single extremity supported Sitting balance-Leahy Scale: Poor Sitting balance - Comments: requires assist and near constant multi-modal cues  to maintain midline Postural control: Posterior lean;Right lateral lean     Standing balance comment: unable                             Pertinent Vitals/Pain Pain Assessment: Faces Faces Pain Scale: Hurts whole lot Pain Location: R shoulder Pain Descriptors / Indicators: Grimacing;Guarding Pain Intervention(s): Limited activity within patient's tolerance;Monitored during session;Repositioned    Home Living Family/patient expects to be discharged to:: Skilled nursing facility Living Arrangements: Spouse/significant other               Additional Comments: was at home with husband    Prior Function Level of Independence: Needs assistance   Gait / Transfers Assistance Needed: uses primarily w/c, unable to determine if pt was able to transfer (d/t pt confusion)     Comments: needed assistance for mobility and adls. Multiple falls. pt is unreliable historian     Hand Dominance   Dominant Hand: Right    Extremity/Trunk Assessment   Upper Extremity Assessment Upper Extremity Assessment: Defer to OT evaluation RUE Deficits / Details: able to move elbow wrist and fingers, AAROM painful at 30 FF, tolerated 30 ABD and gravity eliminated rotation to neutral    Lower Extremity Assessment Lower Extremity Assessment: Generalized weakness;RLE deficits/detail;LLE deficits/detail RLE Deficits / Details: edematous  foot and lower leg, knee flexion ~ 30 degrees, limited by pain. strength grossly 2/5 RLE: Unable to fully assess due to pain RLE Coordination: decreased gross motor LLE Deficits / Details: strength grossly 2-/5 LLE Coordination: decreased gross motor       Communication   Communication: No difficulties   Cognition Arousal/Alertness: Awake/alert Behavior During Therapy: WFL for tasks assessed/performed Overall Cognitive Status: No family/caregiver present to determine baseline cognitive functioning Area of Impairment: Orientation;Following commands;Problem solving;Attention;Memory                 Orientation Level: Disoriented to;Person;Place;Time;Situation Current Attention Level: Focused Memory: Decreased short-term memory Following Commands: Follows one step commands inconsistently;Follows multi-step commands inconsistently;Follows one step commands with increased time     Problem Solving: Difficulty sequencing;Requires verbal cues;Requires tactile cues General Comments: h/o AMS x 1 month per chart.  Focused attention, decreased memory, extra time to follow commands--most one step WFLs; tangential at times, talking about things unrelated to current situation (ie--dtr in [place], "I am in an earthquake bed")      General Comments      Exercises     Assessment/Plan    PT Assessment Patient needs continued PT services  PT Problem List Decreased strength;Decreased range of motion;Decreased activity tolerance;Decreased balance;Pain;Decreased knowledge of use of DME;Decreased mobility;Decreased cognition       PT Treatment Interventions DME instruction;Therapeutic exercise;Functional mobility training;Therapeutic activities;Patient/family education;Balance training    PT Goals (Current goals can be found in the Care Plan section)  Acute Rehab PT Goals Patient Stated Goal: home PT Goal Formulation: Patient unable to participate in goal setting Time For Goal Achievement: 01/17/20 Potential to Achieve Goals: Fair    Frequency Min 2X/week   Barriers to discharge        Co-evaluation PT/OT/SLP Co-Evaluation/Treatment: Yes Reason for Co-Treatment: For patient/therapist safety;To address functional/ADL transfers PT goals addressed during session: Mobility/safety with  mobility;Balance OT goals addressed during session: ADL's and self-care;Strengthening/ROM       AM-PAC PT "6 Clicks" Mobility  Outcome Measure Help needed turning from your back to your side while in a flat bed without using bedrails?: Total Help needed moving from lying on your back to sitting on the side of a flat bed without using bedrails?: Total Help needed moving to and from a bed to a chair (including a wheelchair)?: Total Help needed standing up from a chair using your arms (e.g., wheelchair or bedside chair)?: Total Help needed to walk in hospital room?: Total Help needed climbing 3-5 steps with a railing? : Total 6 Click Score: 6    End of Session   Activity Tolerance: Patient tolerated treatment well Patient left: in bed;with bed alarm set;with call bell/phone within reach Nurse Communication: Mobility status PT Visit Diagnosis: Other abnormalities of gait and mobility (R26.89);Muscle weakness (generalized) (M62.81);Repeated falls (R29.6)    Time: 2956-2130 PT Time Calculation (min) (ACUTE ONLY): 24 min   Charges:   PT Evaluation $PT Eval Moderate Complexity: 1 Mod          Avaeh Ewer, PT   Acute Rehab Dept [facility]): [phone]   01/03/2020   [facility] 01/03/2020, 3:11 PM

## 2020-01-03 NOTE — Progress Notes (Signed)
Echocardiogram 2D Echocardiogram has been performed.  Tiffany [name] 01/03/2020, 3:28 PM

## 2020-01-04 LAB — URINALYSIS, ROUTINE W REFLEX MICROSCOPIC
Bilirubin Urine: NEGATIVE
Glucose, UA: NEGATIVE mg/dL
Hgb urine dipstick: NEGATIVE
Ketones, ur: NEGATIVE mg/dL
Leukocytes,Ua: NEGATIVE
Nitrite: NEGATIVE
Protein, ur: NEGATIVE mg/dL
Specific Gravity, Urine: 1.013 (ref 1.005–1.030)
pH: 7 (ref 5.0–8.0)

## 2020-01-04 MED ORDER — CHLORHEXIDINE GLUCONATE CLOTH 2 % EX PADS
6.0000 | MEDICATED_PAD | Freq: Every day | CUTANEOUS | Status: DC
  Administered 2020-01-05 – 2020-01-11 (×7): 6 via TOPICAL

## 2020-01-04 MED ORDER — QUETIAPINE FUMARATE 25 MG PO TABS
25.0000 mg | ORAL_TABLET | Freq: Every day | ORAL | Status: DC
  Administered 2020-01-04: 25 mg via ORAL
  Filled 2020-01-04: qty 1

## 2020-01-04 NOTE — Progress Notes (Signed)
Pt  did not had urine output during the shift. Bladder scan shows 220ml. Bladder is slightly distended. MD is paged and recommended Foley cath for acute urinary retention. Orders placed. Foley is inserted, urine returned, draining amber colored urine. Pt tolerated well.

## 2020-01-04 NOTE — TOC Initial Note (Signed)
Transition of Care [facility]) - Initial/Assessment Note    Patient Details  Name: Tiffany [name] MRN: [id number] Date of Birth: May 18, 1946  [facility]) CM/SW Contact:    Ross Ludwig, LCSW Phone Number: 01/04/2020, 1:15 PM  Clinical Narrative:                  Patient is a 74 year old female who is alert and oriented x1.  Due to patient's confusion, CSW spoke to patient's husband to complete assessment.  Patient lives with her husband, she has had several surgeries, and has received home health services in the past.  Patient has recently had some confusion, and patient's husband is concerned about what is causing it.  CSW discussed role of CSW in trying to find SNF placement for patient and what the process is to find placement.  CSW explained to patient's husband how insurance will pay for stay and what to expect at SNF.  CSW also explained which different facilities are available.  CSW was given permission to begin bed search in [facility].  Expected Discharge Plan: Skilled Nursing Facility Barriers to Discharge: Continued Medical Work up   Patient Goals and CMS Choice Patient states their goals for this hospitalization and ongoing recovery are:: Patient's husband would like her to get some short term rehab, then return back home. CMS Medicare.gov Compare Post Acute Care list provided to:: Patient Represenative (must comment) Choice offered to / list presented to : Spouse  Expected Discharge Plan and Services Expected Discharge Plan: [place] Acute Care Choice: [place] arrangements for the past 2 months: Single Family Home                 DME Arranged: N/A DME Agency: NA       HH Arranged: NA HH Agency: NA        Prior Living Arrangements/Services Living arrangements for the past 2 months: [place] Lives with:: Spouse Patient language and need for interpreter reviewed:: Yes Do you feel safe  going back to the place where you live?: No   Patient's  Need for Family Participation in Patient Care: Yes (Comment) Care giver support system in place?: No (comment)   Criminal Activity/Legal Involvement Pertinent to Current Situation/Hospitalization: No - Comment as needed  Activities of Daily Living Home Assistive Devices/Equipment: Grab bars in shower, Grab bars around toilet, Wheelchair, Radio producer (specify quad or straight), Environmental consultant (specify type), Eyeglasses, Shower chair with back ADL Screening (condition at time of admission) Patient's cognitive ability adequate to safely complete daily activities?: Yes Is the patient deaf or have difficulty hearing?: Yes(Sometimes) Does the patient have difficulty seeing, even when wearing glasses/contacts?: No Does the patient have difficulty concentrating, remembering, or making decisions?: No Patient able to express need for assistance with ADLs?: Yes Does the patient have difficulty dressing or bathing?: Yes Independently performs ADLs?: No Communication: Independent Dressing (OT): Needs assistance Is this a change from baseline?: Pre-admission baseline Grooming: Independent Feeding: Independent Bathing: Needs assistance Is this a change from baseline?: Pre-admission baseline Toileting: Needs assistance Is this a change from baseline?: Pre-admission baseline In/Out Bed: Needs assistance Is this a change from baseline?: Pre-admission baseline Walks in Home: Needs assistance Is this a change from baseline?: Pre-admission baseline Does the patient have difficulty walking or climbing stairs?: Yes Weakness of Legs: Both Weakness of Arms/Hands: None  Permission Sought/Granted Permission sought to share information with : Customer service manager, Family  Supports Permission granted to share information with : Yes, Verbal Permission Granted  Share Information with NAMEEdessa, Jakubowicz [phone]  Permission  granted to share info w AGENCY: SNF admissions        Emotional Assessment Appearance:: Appears stated age   Affect (typically observed): Calm, Accepting, Appropriate Orientation: : Oriented to Self Alcohol / Substance Use: Not Applicable Psych Involvement: No (comment)  Admission diagnosis:  Weakness [R53.1] Acute pain of right shoulder [M25.511] Altered mental status, unspecified altered mental status type [R41.82] Patient Active Problem List   Diagnosis Date Noted  . Weakness 01/02/2020  . Tremor 01/02/2020  . Shoulder arthritis 02/10/2019  . Other chronic pain 01/24/2019  . Sensory neuropathy 01/24/2019  . Senile purpura ([place]) 10/18/2018  . Unilateral primary osteoarthritis, left knee 07/21/2018  . Obesity 06/27/2018  . Peripheral neuropathy ([place]) 08/27/2016  . GERD (gastroesophageal reflux disease) 03/27/2016  . Subclinical hyperthyroidism 08/18/2015  . Gastric ulcer due to nonsteroidal antiinflammatory drug (NSAID) therapy   . Lung cancer ([place]) 05/09/2015  . Peripheral edema 05/01/2015  . Abnormal glucose 07/18/2014  . Vitamin D deficiency 07/18/2014  . Medication management 07/18/2014  . Hyperlipidemia, mixed 04/04/2014  . Charcot's Right foot 06/15/2013  . Hypertension    PCP:  Unk Pinto, MD Pharmacy:   [facility] [address], [address] [facility] [facility] [address] [place] [address] Phone: [phone] Fax: [phone]     Social Determinants of Health ([place]) Interventions    Readmission Risk Interventions No flowsheet data found.

## 2020-01-04 NOTE — Progress Notes (Signed)
RN notified Baltazar Najjar, NP that the patient is now having visual hallucinations. RN also notified NP that the patient has had 0 mL urinary output since in and out cath at [place]. NP told RN to inform the Day shift RN of the PRN in and out cath order and ask her to speak with the Physician assigned to the patient during to the day about inserting an indwelling catheter.  Orders received: give PRN risperiDone.

## 2020-01-04 NOTE — Plan of Care (Signed)
  Problem: Education: Goal: Knowledge of General Education information will improve Description: Including pain rating scale, medication(s)/side effects and non-pharmacologic comfort measures Outcome: Progressing   Problem: Clinical Measurements: Goal: Respiratory complications will improve Outcome: Progressing Goal: Cardiovascular complication will be avoided Outcome: Progressing   Problem: Elimination: Goal: Will not experience complications related to urinary retention Outcome: Progressing   Problem: Pain Managment: Goal: General experience of comfort will improve Outcome: Progressing   Problem: Safety: Goal: Ability to remain free from injury will improve Outcome: Progressing   Problem: Skin Integrity: Goal: Risk for impaired skin integrity will decrease Outcome: Progressing

## 2020-01-04 NOTE — NC FL2 (Signed)
[place] MEDICAID FL2 LEVEL OF CARE SCREENING TOOL     IDENTIFICATION  Patient Name: Tiffany [name] Birthdate: 1946/08/29 Sex: female Admission Date (Current Location): 01/02/2020  [place] and [place] Number:  Tiffany [name] [id number] [place] and Address:  [facility],  [place], [place]      Provider Number: [id number]  Attending Physician Name and Address:  Lucky Cowboy, MD  Relative Name and Phone Number:  Dalisha, Shively [phone]    Current Level of Care: Hospital Recommended Level of Care: [place] Prior Approval Number:    Date Approved/Denied:   PASRR Number:    Discharge Plan: SNF    Current Diagnoses: Patient Active Problem List   Diagnosis Date Noted  . Weakness 01/02/2020  . Tremor 01/02/2020  . Shoulder arthritis 02/10/2019  . Other chronic pain 01/24/2019  . Sensory neuropathy 01/24/2019  . Senile purpura ([place]) 10/18/2018  . Unilateral primary osteoarthritis, left knee 07/21/2018  . Obesity 06/27/2018  . Peripheral neuropathy ([place]) 08/27/2016  . GERD (gastroesophageal reflux disease) 03/27/2016  . Subclinical hyperthyroidism 08/18/2015  . Gastric ulcer due to nonsteroidal antiinflammatory drug (NSAID) therapy   . Lung cancer ([place]) 05/09/2015  . Peripheral edema 05/01/2015  . Abnormal glucose 07/18/2014  . Vitamin D deficiency 07/18/2014  . Medication management 07/18/2014  . Hyperlipidemia, mixed 04/04/2014  . Charcot's Right foot 06/15/2013  . Hypertension     Orientation RESPIRATION BLADDER Height & Weight     Self  Normal Continent Weight: 234 lb 2.1 oz (106.2 kg) Height:  5\' 7"  (170.2 cm)  BEHAVIORAL SYMPTOMS/MOOD NEUROLOGICAL BOWEL NUTRITION STATUS      Continent Diet(Cardiac)  AMBULATORY STATUS COMMUNICATION OF NEEDS Skin   Limited Assist Verbally Normal                       Personal Care Assistance Level of Assistance  Bathing, Feeding, Dressing  Bathing Assistance: Limited assistance Feeding assistance: Limited assistance Dressing Assistance: Limited assistance     Functional Limitations Info  Sight, Speech, Hearing Sight Info: Adequate Hearing Info: Adequate Speech Info: Adequate    SPECIAL CARE FACTORS FREQUENCY  PT (By licensed PT), OT (By licensed OT)     PT Frequency: Minimum 5x a week OT Frequency: Minimum 5x a week            Contractures Contractures Info: Not present    Additional Factors Info  Code Status Code Status Info: Full Code             Current Medications (01/04/2020):  This is the current hospital active medication list Current Facility-Administered Medications  Medication Dose Route Frequency Provider Last Rate Last Admin  . acetaminophen (TYLENOL) tablet 650 mg  650 mg Oral Q6H PRN Tu, Ching T, DO   650 mg at 01/03/20 0326  . aspirin EC tablet 81 mg  81 mg Oral Daily Tu, Ching T, DO   81 mg at 01/04/20 1030  . diazepam (VALIUM) tablet 5 mg  5 mg Oral BID Tu, Ching T, DO   5 mg at 01/04/20 1029  . enalapril (VASOTEC) tablet 10 mg  10 mg Oral Daily Tu, Ching T, DO   10 mg at 01/04/20 1030  . enoxaparin (LOVENOX) injection 40 mg  40 mg Subcutaneous Q24H Tu, Ching T, DO   40 mg at 01/04/20 1030  . famotidine (PEPCID) tablet 40 mg  40 mg Oral Daily Tu, Ching T, DO   40 mg  at 01/04/20 1029  . magnesium oxide (MAG-OX) tablet 400 mg  400 mg Oral QHS Tu, Ching T, DO   400 mg at 01/03/20 2112  . MEDLINE mouth rinse  15 mL Mouth Rinse BID Lucky Cowboy, MD   15 mL at 01/04/20 1029  . nystatin (MYCOSTATIN) 100000 UNIT/ML suspension 500,000 Units  5 mL Mouth/Throat QID Lucky Cowboy, MD   500,000 Units at 01/04/20 1030  . propranolol (INDERAL) tablet 20 mg  20 mg Oral TID WC Lucky Cowboy, MD   20 mg at 01/04/20 1029  . QUEtiapine (SEROQUEL) tablet 25 mg  25 mg Oral Daily Lucky Cowboy, MD   25 mg at 01/04/20 1029  . risperiDONE (RISPERDAL M-TABS) disintegrating tablet 1 mg  1 mg Oral Q12H PRN Lucky Cowboy, MD   1 mg at 01/04/20 0616  . rosuvastatin (CRESTOR) tablet 20 mg  20 mg Oral Daily Tu, Ching T, DO   20 mg at 01/04/20 1030     Discharge Medications: Please see discharge summary for a list of discharge medications.  Relevant Imaging Results:  Relevant Lab Results:   Additional Information SSN [id number]  Ross Ludwig, LCSW

## 2020-01-04 NOTE — Progress Notes (Signed)
Spoke with Pt's Spouse and updated.

## 2020-01-04 NOTE — Progress Notes (Addendum)
PROGRESS NOTE    Tiffany [name]  CWC:[id number] DOB: 26-Oct-1946 DOA: 01/02/2020 PCP: Unk Pinto, MD    Brief Narrative:  Tiffany [name] is a 74 y.o. female with medical history significant of history of lung cancer, Charcot-Marie-Tooth, tremors subclinical hypothyroidism, hypertension and hyperlipidemia presents with worsening right shoulder pain and worsening weakness. Also noted to have worsening mentation per husband.    Assessment & Plan:   Principal Problem:   Weakness Active Problems:   Hypertension   Charcot's Right foot   Subclinical hyperthyroidism   Obesity   Sensory neuropathy   Shoulder arthritis   Tremor   Worsening weakness with hx of neuropathy/charcot's foot/severe spinal stenosis Has severe spinal stenosis of L4-L5 followed by neurosurgery- reportedly planned operation PT/OT recommend SNF- will possible need rehab placement since pt's husband is having difficulty caring for her Foley catheter placed for 2 episodes of acute urinary retention.  Altered mentation  Per husband, patient's mentation has been declining rapidly in the last 3 weeks. She was found to have a UTI during this time which was treated with ciprofloxacin. It appears patient's mentation was declining prior to ciprofloxacin.   Structural abnormality ruled out: CT head and MRI brain here negative. TSH low (has been low since 2014), free T4 normal. Do not think this is contributing her altered mentation since this has been a chronic issue.  ?dementia vs drug side effect (?phentermine)  Sitter at bedside as she is not redirectable verbally and is getting out of bed  Risperidone ODT PRN for agitation that is not verbally redirectable  Discussed case with neurology.  Recommended starting Seroquel 25 mg daily for possible delirium/dementia.  Maintain day/night sleep cycle.  Avoid narcotics and benzodiazepines.  She is already on diazepam at home which will be continued to avoid withdrawal  symptoms.  Elevated creatinine Suspect this is becoming CKD as her GFR has gradually worsened for the past 2 months - could be due to uncontrolled HTN and she is on phentermine Mobic, phentermine discontinued Creatinine improved  Hypertension  Pt was documented to have postural hypotension on 12/22 and her propranolol and enalapril were discontinued at that point. Unsure if this was ever resumed.  Continue enapril at lower dose of 10mg .   Right shoulder pain X-ray with no acute fracture. Hx of right shoulder replacement and subacute healing of clavicle PRN Tylenol for pain  Subclinical hyperthyroidism  TSH has been low since 2014.  TSH of 0.142, free T4 normal   Tremors Continue propranolol and Valium for tremors  Obesity Discontinue topamax, phentermine due to their propensity to cause cognitive dysfunction/ CNS side effects   Oral thrush Continue nystatin for 7 days  DVT prophylaxis: Lovenox SQ Code Status: Full code  Family Communication:  Disposition Plan: SNF placement when medically ready, TOC consulted   Consultants:   None   Procedures:  None   Antimicrobials:   None     Subjective: Awake all night.  Family slept this morning with Seroquel.  We will switch Seroquel to bedtime.  Objective: Vitals:   01/03/20 1810 01/03/20 2055 01/03/20 2134 01/04/20 0542  BP: (!) 152/89 (!) 162/105 (!) 150/90 (!) 147/95  Pulse: 78 69  75  Resp:  18  17  Temp:      TempSrc:      SpO2:  100%  96%  Weight:      Height:        Intake/Output Summary (Last 24 hours) at 01/04/2020 0850 Last data filed at  01/04/2020 0606 Gross per 24 hour  Intake 370 ml  Output 1300 ml  Net -930 ml   Filed Weights   01/03/20 0238  Weight: 106.2 kg    Examination:  General exam: Appears asleep and looks comfortable Respiratory system: Clear to auscultation. Respiratory effort normal. Cardiovascular system: S1 & S2 heard, RRR. No JVD, murmurs.  Gastrointestinal system:  Abdomen is nondistended, soft and nontender. Normal bowel sounds heard. Central nervous system: Alert, oriented to self, not place or time. Bilateral LE reduced strength due to ?deconditioning  Extremities: Significant bilateral pitting edema, tender on palpation. Skin: No rashes, lesions or ulcers Psychiatry: Unable to assess  Data Reviewed: I have personally reviewed following labs and imaging studies  CBC: Recent Labs  Lab 01/02/20 1620 01/03/20 0229  WBC 8.7 9.2  NEUTROABS 5.7  --   HGB 13.1 12.7  HCT 43.6 43.1  MCV 97.5 99.3  PLT 299 101   Basic Metabolic Panel: Recent Labs  Lab 01/02/20 1620 01/03/20 0229  NA 142 140  K 4.2 3.8  CL 106 106  CO2 26 21*  GLUCOSE 105* 90  BUN 15 13  CREATININE 1.56* 1.34*  CALCIUM 9.7 9.2   GFR: Estimated Creatinine Clearance: 48.3 mL/min (A) (by C-G formula based on SCr of 1.34 mg/dL (H)). Liver Function Tests: Recent Labs  Lab 01/02/20 1620  AST 25  ALT 24  ALKPHOS 110  BILITOT 0.6  PROT 7.3  ALBUMIN 3.5   No results for input(s): LIPASE, AMYLASE in the last 168 hours. Recent Labs  Lab 01/02/20 1620  AMMONIA 13   Coagulation Profile: No results for input(s): INR, PROTIME in the last 168 hours. Cardiac Enzymes: No results for input(s): CKTOTAL, CKMB, CKMBINDEX, TROPONINI in the last 168 hours. BNP (last 3 results) No results for input(s): PROBNP in the last 8760 hours. HbA1C: No results for input(s): HGBA1C in the last 72 hours. CBG: No results for input(s): GLUCAP in the last 168 hours. Lipid Profile: No results for input(s): CHOL, HDL, LDLCALC, TRIG, CHOLHDL, LDLDIRECT in the last 72 hours. Thyroid Function Tests: Recent Labs    01/02/20 1620 01/02/20 2106  TSH 0.142*  --   FREET4  --  0.91   Anemia Panel: No results for input(s): VITAMINB12, FOLATE, FERRITIN, TIBC, IRON, RETICCTPCT in the last 72 hours. Sepsis Labs: No results for input(s): PROCALCITON, LATICACIDVEN in the last 168 hours.  Recent  Results (from the past 240 hour(s))  SARS CORONAVIRUS 2 (TAT 6-24 HRS) Nasopharyngeal Nasopharyngeal Swab     Status: None   Collection Time: 01/02/20  7:13 PM   Specimen: Nasopharyngeal Swab  Result Value Ref Range Status   SARS Coronavirus 2 NEGATIVE NEGATIVE Final    Comment: (NOTE) SARS-CoV-2 target nucleic acids are NOT DETECTED. The SARS-CoV-2 RNA is generally detectable in upper and lower respiratory specimens during the acute phase of infection. Negative results do not preclude SARS-CoV-2 infection, do not rule out co-infections with other pathogens, and should not be used as the sole basis for treatment or other patient management decisions. Negative results must be combined with clinical observations, patient history, and epidemiological information. The expected result is Negative. Fact Sheet for Patients: SugarRoll.be Fact Sheet for Healthcare Providers: https://www.woods-mathews.com/ This test is not yet approved or cleared by the [place] FDA and  has been authorized for detection and/or diagnosis of SARS-CoV-2 by FDA under an Emergency Use Authorization (EUA). This EUA will remain  in effect (meaning this test can be used) for the  duration of the COVID-19 declaration under Section 56 4(b)(1) of the Act, 21 U.S.C. section 360bbb-3(b)(1), unless the authorization is terminated or revoked sooner. Performed at [facility], [place] [address] [place], [place] [address]        Radiology Studies: DG Chest 2 View  Result Date: 01/02/2020 CLINICAL DATA:  Right shoulder pain EXAM: CHEST - 2 VIEW COMPARISON:  03/27/2017 FINDINGS: Surgical clips left hilum from prior lobectomy.  No recurrent mass. No acute infiltrate or effusion.  Negative for heart failure Chronic left rib fractures. Subacute healing fracture distal right clavicle. Right shoulder replacement. IMPRESSION: No acute cardiopulmonary abnormality Healing fracture  distal right clavicle. Electronically Signed   By: Franchot Gallo M.D.   On: 01/02/2020 16:07   DG Shoulder Right  Result Date: 01/02/2020 CLINICAL DATA:  Right shoulder pain. EXAM: RIGHT SHOULDER - 2+ VIEW COMPARISON:  02/25/2019 FINDINGS: Right shoulder replacement.  Normal alignment. Fracture of the distal third of the clavicle. This has sclerotic margins suggesting a subacute fracture with partial healing. This was not present previously. IMPRESSION: Right shoulder replacement Subacute healing fracture right distal clavicle. Electronically Signed   By: Franchot Gallo M.D.   On: 01/02/2020 16:05   CT Head Wo Contrast  Result Date: 01/02/2020 CLINICAL DATA:  Encephalopathy EXAM: CT HEAD WITHOUT CONTRAST TECHNIQUE: Contiguous axial images were obtained from the base of the skull through the vertex without intravenous contrast. COMPARISON:  CT head 12/08/2019 FINDINGS: Brain: Cerebral atrophy most prominent in the frontal lobes, stable. Negative for hydrocephalus. Chronic white matter ischemic changes stable. Negative for acute infarct, hemorrhage, mass. Vascular: Negative for hyperdense vessel Skull: Negative Sinuses/Orbits: Mucosal edema sphenoid sinus and right mastoid sinus. Remaining sinuses clear. No orbital lesion. Other: None IMPRESSION: Atrophy and chronic white matter ischemia. No acute abnormality and no change from the prior study. Electronically Signed   By: Franchot Gallo M.D.   On: 01/02/2020 16:27   MR BRAIN WO CONTRAST  Result Date: 01/02/2020 CLINICAL DATA:  Altered mental status. EXAM: MRI HEAD WITHOUT CONTRAST TECHNIQUE: Multiplanar, multiecho pulse sequences of the brain and surrounding structures were obtained without intravenous contrast. COMPARISON:  Head CT 01/02/2020 FINDINGS: The study is intermittently moderately motion degraded. Brain: There is no evidence of acute infarct, intracranial hemorrhage, mass, midline shift, or extra-axial fluid collection. T2 hyperintensities in the  cerebral white matter bilaterally are nonspecific but compatible with mild-to-moderate chronic small vessel ischemic disease. There is moderate cerebral atrophy. Vascular: Major intracranial vascular flow voids are preserved. Skull and upper cervical spine: No suspicious marrow lesion. Previous cervical spine fusion. Sinuses/Orbits: Unremarkable orbits. Trace right sphenoid sinus fluid. Moderately large right mastoid effusion. Other: None. IMPRESSION: 1. No acute intracranial abnormality. 2. Mild-to-moderate chronic small vessel ischemic disease and moderate cerebral atrophy. Electronically Signed   By: Logan Bores M.D.   On: 01/02/2020 18:40   DG Humerus Right  Result Date: 01/02/2020 CLINICAL DATA:  Arm pain EXAM: RIGHT HUMERUS - 2+ VIEW COMPARISON:  None. FINDINGS: Right shoulder replacement without acute complication Fracture distal third of the clavicle with sclerotic margins and partial healing. IMPRESSION: Subacute healing fracture distal right clavicle Electronically Signed   By: Franchot Gallo M.D.   On: 01/02/2020 16:06   ECHOCARDIOGRAM COMPLETE  Result Date: 01/03/2020   ECHOCARDIOGRAM REPORT   Patient Name:   Tiffany [name] Date of Exam: 01/03/2020 Medical Rec #:  [id number]      Height:       67.0 in Accession #:  [id number]     Weight:       234.1 lb Date of Birth:  03-Nov-1946     BSA:          2.16 m Patient Age:    46 years       BP:           136/81 mmHg Patient Gender: F              HR:           66 bpm. Exam Location:  Inpatient Procedure: 2D Echo, Color Doppler and Cardiac Doppler Indications:    H06.23 Acute diastolic (congestive) heart failure  History:        Patient has no prior history of Echocardiogram examinations.                 Risk Factors:Hypertension and Dyslipidemia. Lung Cancer.  Sonographer:    Raquel Sarna Senior RDCS Referring Phys: [id number] Lucky Cowboy  Sonographer Comments: No apical window due to decreased skin integrity. IMPRESSIONS  1. Left ventricular ejection fraction,  by visual estimation, is 50 to 55%. The left ventricle has low normal function. Left ventricular septal wall thickness was mildly increased.  2. Left ventricular diastolic function could not be evaluated.  3. Global right ventricle has normal systolic function.The right ventricular size is normal. No increase in right ventricular wall thickness.  4. Mild mitral annular calcification.  5. The mitral valve is normal in structure. Trivial mitral valve regurgitation. Unable to assess for MS due to no continuous wave Doppler. Normal leaflet excursion suggests no significant mitral stenosis.  6. The tricuspid valve is normal in structure. Trivial TR.  7. The aortic valve is tricuspid. Aortic valve regurgitation is not visualized. Unable to assess for AS due to no continuous wave Doppler. Leaflet mobility is normal, suggesting no significant stenosis.  8. The pulmonic valve was normal in structure. Pulmonic valve regurgitation is trivial.  9. TR signal is inadequate for assessing pulmonary artery systolic pressure. 10. The inferior vena cava is normal in size with greater than 50% respiratory variability, suggesting right atrial pressure of 3 mmHg. FINDINGS  Left Ventricle: Left ventricular ejection fraction, by visual estimation, is 50 to 55%. The left ventricle has low normal function. The left ventricle is not well visualized. There is mildly increased left ventricular hypertrophy. Left ventricular diastolic function could not be evaluated. Normal left atrial pressure. Right Ventricle: The right ventricular size is normal. No increase in right ventricular wall thickness. Global RV systolic function is has normal systolic function. Left Atrium: Left atrial size was not assessed. Right Atrium: Right atrial size was not assessed Pericardium: There is no evidence of pericardial effusion. Mitral Valve: The mitral valve is normal in structure. Mild mitral annular calcification. Trivial mitral valve regurgitation. Unable to  assess for MS due to no continuous wave Doppler. Normal leaflet excursion suggests no significant mitral valve stenosis by observation. Tricuspid Valve: The tricuspid valve is normal in structure. Tricuspid valve regurgitation is trivial. Aortic Valve: The aortic valve is tricuspid. Aortic valve regurgitation is not visualized. Unable to assess for AS due to no continuous wave Doppler. Leaflet mobility is normal, suggesting no significant stenosis. Pulmonic Valve: The pulmonic valve was normal in structure. Pulmonic valve regurgitation is trivial. Pulmonic regurgitation is trivial. Aorta: The aortic root is normal in size and structure, the ascending aorta was not well visualized and the aortic arch was not well visualized. Venous: The inferior vena cava is normal in size  with greater than 50% respiratory variability, suggesting right atrial pressure of 3 mmHg. IAS/Shunts: No atrial level shunt detected by color flow Doppler. There is no evidence of a patent foramen ovale. No ventricular septal defect is seen or detected. There is no evidence of an atrial septal defect.  LEFT VENTRICLE PLAX 2D LVIDd:         4.20 cm LVIDs:         2.70 cm LV PW:         0.80 cm LV IVS:        1.10 cm LVOT diam:     1.80 cm LV SV:         52 ml LV SV Index:   22.56 LVOT Area:     2.54 cm  LEFT ATRIUM         Index LA diam:    4.10 cm 1.90 cm/m   AORTA Ao Root diam: 3.00 cm  SHUNTS Systemic Diam: 1.80 cm  Cherlynn Kaiser MD Electronically signed by Cherlynn Kaiser MD Signature Date/Time: 01/03/2020/10:13:06 PM    Final         Scheduled Meds: . aspirin EC  81 mg Oral Daily  . diazepam  5 mg Oral BID  . enalapril  10 mg Oral Daily  . enoxaparin (LOVENOX) injection  40 mg Subcutaneous Q24H  . famotidine  40 mg Oral Daily  . magnesium oxide  400 mg Oral QHS  . mouth rinse  15 mL Mouth Rinse BID  . nystatin  5 mL Mouth/Throat QID  . propranolol  20 mg Oral TID WC  . rosuvastatin  20 mg Oral Daily   Continuous  Infusions:   LOS: 2 days    Time spent: Spent more than 30 minutes in coordinating care for this patient including bedside patient care.    Lucky Cowboy, MD [facility] If 7PM-7AM, please contact night-coverage 01/04/2020, 8:50 AM

## 2020-01-05 DIAGNOSIS — R4182 Altered mental status, unspecified: Secondary | ICD-10-CM

## 2020-01-05 MED ORDER — QUETIAPINE FUMARATE 25 MG PO TABS
25.0000 mg | ORAL_TABLET | Freq: Every day | ORAL | Status: DC
  Administered 2020-01-05 – 2020-01-08 (×4): 25 mg via ORAL
  Filled 2020-01-05 (×4): qty 1

## 2020-01-05 MED ORDER — DIAZEPAM 5 MG PO TABS
2.5000 mg | ORAL_TABLET | Freq: Two times a day (BID) | ORAL | Status: DC
  Administered 2020-01-05 – 2020-01-11 (×11): 2.5 mg via ORAL
  Filled 2020-01-05 (×12): qty 1

## 2020-01-05 MED ORDER — ENALAPRIL MALEATE 10 MG PO TABS
20.0000 mg | ORAL_TABLET | Freq: Every day | ORAL | Status: DC
  Administered 2020-01-05 – 2020-01-10 (×5): 20 mg via ORAL
  Filled 2020-01-05 (×7): qty 2

## 2020-01-05 NOTE — Plan of Care (Signed)
  Problem: Clinical Measurements: Goal: Will remain free from infection Outcome: Progressing Goal: Respiratory complications will improve Outcome: Progressing Goal: Cardiovascular complication will be avoided Outcome: Progressing   Problem: Elimination: Goal: Will not experience complications related to bowel motility Outcome: Progressing Goal: Will not experience complications related to urinary retention Outcome: Progressing   Problem: Pain Managment: Goal: General experience of comfort will improve Outcome: Progressing   Problem: Safety: Goal: Ability to remain free from injury will improve Outcome: Progressing

## 2020-01-05 NOTE — Progress Notes (Signed)
PROGRESS NOTE    Tiffany [name]  YSA:[id number] DOB: 03/22/1946 DOA: 01/02/2020 PCP: Unk Pinto, MD    Brief Narrative:  Tiffany [name] is a 74 y.o. female with medical history significant of history of lung cancer, Charcot-Marie-Tooth, tremors subclinical hyperthyroidism, hypertension and hyperlipidemia presents with worsening right shoulder pain and worsening weakness. Also noted to have worsening mentation per husband.    Assessment & Plan:   Principal Problem:   Weakness Active Problems:   Hypertension   Charcot's Right foot   Subclinical hyperthyroidism   Obesity   Sensory neuropathy   Shoulder arthritis   Tremor   Worsening weakness with hx of neuropathy/charcot's foot/severe spinal stenosis Has severe spinal stenosis of L4-L5 followed by neurosurgery- reportedly planned operation PT/OT recommend SNF- will possible need rehab placement since pt's husband is having difficulty caring for her Foley catheter placed for 2 episodes of acute urinary retention.  Acute encephalopathy  Etiology unclear. Per husband, patient's mentation has been declining rapidly in the last 3 weeks. She was found to have a UTI during this time which was treated with ciprofloxacin. It appears patient's mentation was declining prior to ciprofloxacin.   Structural abnormality ruled out: CT head and MRI brain here negative for acute abnormality but show moderate atrophy. TSH low (has been low since 2014), free T4 normal. Do not think this is contributing her altered mentation since this has been a chronic issue.  Phentermine, topamax discontinued Neurology consulted. Appreciate recommendations.  seroquel 25 mg at bedtime.  Maintain day/night sleep cycle.  Avoid narcotics and benzodiazepines.   Taper down diazepam to 2.5 mg BID   Elevated creatinine Suspect this is becoming CKD as her GFR has gradually worsened for the past 2 months - could be due to uncontrolled HTN and she is on phentermine  Mobic, phentermine discontinued Creatinine improved  Hypertension  BP elevated Pt was documented to have postural hypotension on 12/22 and her propranolol and enalapril were discontinued at that point. Unsure if this was ever resumed as an outpatient.  Increase enalapril dose to 20 mg daily for better BP control   Right shoulder pain X-ray with no acute fracture. Hx of right shoulder replacement and subacute healing of clavicle PRN Tylenol for pain  Subclinical hyperthyroidism  TSH has been low since 2014.  TSH of 0.142, free T4 normal   Tremors Continue propranolol and Valium (decreased dose) for tremors  Obesity Discontinue topamax, phentermine due to their propensity to cause cognitive dysfunction/ CNS side effects   Oral thrush Continue nystatin for 7 days  DVT prophylaxis: Lovenox SQ Code Status: DNR  Family Communication: husband updated at bedside  Disposition Plan: SNF placement when medically ready, TOC consulted   Consultants:   Neurology   Procedures:  None   Antimicrobials:   None     Subjective: Patient awake. Smiles and follows simple commands.   Objective: Vitals:   01/05/20 1312 01/05/20 1352 01/05/20 1648 01/05/20 1742  BP: (!) 155/89 (!) 157/100 (!) 180/89 (!) 152/88  Pulse: 69 91 75 70  Resp: 18 16 18    Temp: 98.3 F (36.8 C) 97.8 F (36.6 C)    TempSrc: Oral Oral    SpO2: 100% 100%    Weight:      Height:        Intake/Output Summary (Last 24 hours) at 01/05/2020 1743 Last data filed at 01/05/2020 0845 Gross per 24 hour  Intake 15 ml  Output 950 ml  Net -935 ml   Filed  Weights   01/03/20 0238  Weight: 106.2 kg    Examination:  General exam: Awake, looks comfortable  Respiratory system: Clear to auscultation. Respiratory effort normal. Cardiovascular system: S1 & S2 heard, RRR. No JVD, murmurs.  Gastrointestinal system: Abdomen is nondistended, soft and nontender. Normal bowel sounds heard. Central nervous system:  Alert, oriented to self, not place or time. Bilateral LE reduced strength, strength intact in upper extremities  Extremities: Significant bilateral pitting edema, tender on palpation. Skin: No rashes, lesions or ulcers Psychiatry: Unable to assess  Data Reviewed: I have personally reviewed following labs and imaging studies  CBC: Recent Labs  Lab 01/02/20 1620 01/03/20 0229  WBC 8.7 9.2  NEUTROABS 5.7  --   HGB 13.1 12.7  HCT 43.6 43.1  MCV 97.5 99.3  PLT 299 161   Basic Metabolic Panel: Recent Labs  Lab 01/02/20 1620 01/03/20 0229  NA 142 140  K 4.2 3.8  CL 106 106  CO2 26 21*  GLUCOSE 105* 90  BUN 15 13  CREATININE 1.56* 1.34*  CALCIUM 9.7 9.2   GFR: Estimated Creatinine Clearance: 48.3 mL/min (A) (by C-G formula based on SCr of 1.34 mg/dL (H)). Liver Function Tests: Recent Labs  Lab 01/02/20 1620  AST 25  ALT 24  ALKPHOS 110  BILITOT 0.6  PROT 7.3  ALBUMIN 3.5   No results for input(s): LIPASE, AMYLASE in the last 168 hours. Recent Labs  Lab 01/02/20 1620  AMMONIA 13   Coagulation Profile: No results for input(s): INR, PROTIME in the last 168 hours. Cardiac Enzymes: No results for input(s): CKTOTAL, CKMB, CKMBINDEX, TROPONINI in the last 168 hours. BNP (last 3 results) No results for input(s): PROBNP in the last 8760 hours. HbA1C: No results for input(s): HGBA1C in the last 72 hours. CBG: No results for input(s): GLUCAP in the last 168 hours. Lipid Profile: No results for input(s): CHOL, HDL, LDLCALC, TRIG, CHOLHDL, LDLDIRECT in the last 72 hours. Thyroid Function Tests: Recent Labs    01/02/20 2106  FREET4 0.91   Anemia Panel: No results for input(s): VITAMINB12, FOLATE, FERRITIN, TIBC, IRON, RETICCTPCT in the last 72 hours. Sepsis Labs: No results for input(s): PROCALCITON, LATICACIDVEN in the last 168 hours.  Recent Results (from the past 240 hour(s))  SARS CORONAVIRUS 2 (TAT 6-24 HRS) Nasopharyngeal Nasopharyngeal Swab     Status:  None   Collection Time: 01/02/20  7:13 PM   Specimen: Nasopharyngeal Swab  Result Value Ref Range Status   SARS Coronavirus 2 NEGATIVE NEGATIVE Final    Comment: (NOTE) SARS-CoV-2 target nucleic acids are NOT DETECTED. The SARS-CoV-2 RNA is generally detectable in upper and lower respiratory specimens during the acute phase of infection. Negative results do not preclude SARS-CoV-2 infection, do not rule out co-infections with other pathogens, and should not be used as the sole basis for treatment or other patient management decisions. Negative results must be combined with clinical observations, patient history, and epidemiological information. The expected result is Negative. Fact Sheet for Patients: SugarRoll.be Fact Sheet for Healthcare Providers: https://www.woods-mathews.com/ This test is not yet approved or cleared by the [place] FDA and  has been authorized for detection and/or diagnosis of SARS-CoV-2 by FDA under an Emergency Use Authorization (EUA). This EUA will remain  in effect (meaning this test can be used) for the duration of the COVID-19 declaration under Section 56 4(b)(1) of the Act, 21 U.S.C. section 360bbb-3(b)(1), unless the authorization is terminated or revoked sooner. Performed at [facility], [address] [place], [place] [address]        Radiology Studies: No results found.      Scheduled Meds: . aspirin EC  81 mg Oral Daily  . Chlorhexidine Gluconate Cloth  6 each Topical Daily  . diazepam  2.5 mg Oral BID  . enalapril  20 mg Oral Daily  . enoxaparin (LOVENOX) injection  40 mg Subcutaneous Q24H  . famotidine  40 mg Oral Daily  . magnesium oxide  400 mg Oral QHS  . mouth rinse  15 mL Mouth Rinse BID  . nystatin  5 mL Mouth/Throat QID  . propranolol  20 mg Oral TID WC  . QUEtiapine  25 mg Oral q1800  . rosuvastatin  20 mg Oral Daily   Continuous Infusions:   LOS: 3 days    Time  spent: Spent more than 30 minutes in coordinating care for this patient including bedside patient care.    Lucky Cowboy, MD [facility] If 7PM-7AM, please contact night-coverage 01/05/2020, 5:43 PM

## 2020-01-05 NOTE — Progress Notes (Signed)
Brief update note   Updated patient's husband at bedside. Patient is oriented to self only and does not recognize her husband.  Discussed code status. Husband would like her to be a Do Not Resuscitate. He understands this means if her heart stops or she stops breathing, we would not do CPR, give a shock or put a breathing tube in. Code status changed in the chart. Primary RN informed.   Budd Palmer, MD Internal Medicine  Hospitalist

## 2020-01-05 NOTE — TOC Progression Note (Signed)
Transition of Care [facility]) - Progression Note    Patient Details  Name: Tiffany [name] MRN: [id number] Date of Birth: 1946-05-03  [facility] CM/SW Contact  Ross Ludwig, [place] Phone Number: 01/05/2020, 5:02 PM  Clinical Narrative:    CSW received phone call from patient's husband Merry Proud [phone] and he informed CSW that the physician said patient may need hospice services and will probably not benefit from short term rehab.  Patient's husband asked what other options are available for patient.  CSW informed him that hospice at home is possibility or if patient is appropriate, the hospice facility might be an option.  CSW informed him that palliative might be able ordered to talk to him about prognosis and options.  CSW waiting to find out what disposition plan will be for patient.  CSW to follow up tomorrow with patient's husband and physician.   Expected Discharge Plan: [place] Barriers to Discharge: Continued Medical Work up  Expected Discharge Plan and Services Expected Discharge Plan: [place] Choice: [place] arrangements for the past 2 months: Single Family Home                 DME Arranged: N/A DME Agency: NA       HH Arranged: NA HH Agency: NA         Social Determinants of Health (SDOH) Interventions    Readmission Risk Interventions No flowsheet data found.

## 2020-01-05 NOTE — Consult Note (Signed)
Neurology Consultation  Reason for Consult: Altered mental status Referring Physician: Lucky Cowboy, MD  CC: Confusion/altered mental status  History is obtained from: Chart  HPI: Tiffany [name] is a 74 y.o. female with past medical history of neuropathy, hypothyroidism, hyperlipidemia, hypertension.  Patient was initially brought to the hospital secondary to her husband trying to get her out of bed and reported a pop in her shoulder however, he also mentioned that over the past month she is mentally declined significantly.  To at this point she is alert only to her self at times.  Patient has had a significant work-up while in the hospital including CT head, MRI of head, TSH, thiamine, B12, vitamin D, urinalysis, ammonia.  None of which has shown etiology for patient's sudden onset of confusion.  Patient is unable to give any history at this time.  While in the room patient is actively seeing bugs on the wall.  Neurology was asked to evaluate patient for any further etiologies for her confusion that may have been missed other than severe dementia.   Chart review (patient has been seen 5 years ago by Dr. Krista Blue for high arched feet, worsening gait difficulty, discoloration of the feet,.  She was also seen on 12/08/2019 as a consult by Dr. Cheral Marker.  At that time was for lower extremity weakness.  In his note it was deemed secondary to increased deformity of her feet along with significantly low TSH.   ED work-up: CBC unremarkable Sodium 142, potassium 4.2, glucose of 105, creatinine of 1.56, TSH of 0.142, CT head with no acute abnormality, MRI with no acute intracranial abnormalities with mild to moderate chronic small vessel ischemic disease. Patient's B12 was 598, B1 is 215, ammonia is 13 free T4 is 0.91 , Past Medical History:  Diagnosis Date  . Anemia   . Arthritis   . Cancer ([place])    lung cancer 2016  . Gait difficulty    problems with balance  . High blood pressure   . History of  blood transfusion    05-14-15  . Hyperlipidemia   . Hyperthyroidism mild  . Neuropathy   . Neuropathy   . Obesity (BMI 35.0-39.9 without comorbidity)   . Pre-diabetes   . 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 Vitamin D deficiency   . Wears glasses   . Wears partial dentures     Family History  Problem Relation Age of Onset  . Heart attack Father   . Hypertension Mother   . Breast cancer Maternal Aunt   . Colon cancer Paternal Grandmother    Social History:   reports that she quit smoking about 4 years ago. Her smoking use included cigarettes. She has a 30.00 pack-year smoking history. She has never used smokeless tobacco. She reports current alcohol use of about 2.0 standard drinks of alcohol per week. She reports that she does not use drugs.  Medications  Current Facility-Administered Medications:  .  acetaminophen (TYLENOL) tablet 650 mg, 650 mg, Oral, Q6H PRN, Tu, Ching T, DO, 650 mg at 01/03/20 0326 .  aspirin EC tablet 81 mg, 81 mg, Oral, Daily, Tu, Ching T, DO, 81 mg at 01/05/20 0819 .  Chlorhexidine Gluconate Cloth 2 % PADS 6 each, 6 each, Topical, Daily, Lucky Cowboy, MD, 6 each at 01/05/20 [phone] .  diazepam (VALIUM) tablet 5 mg, 5 mg, Oral, BID, Tu, Ching T, DO, 5 mg at 01/05/20 0819 .  enalapril (VASOTEC) tablet 20 mg, 20  mg, Oral, Daily, Lucky Cowboy, MD, 20 mg at 01/05/20 [phone] .  enoxaparin (LOVENOX) injection 40 mg, 40 mg, Subcutaneous, Q24H, Tu, Ching T, DO, 40 mg at 01/05/20 0817 .  famotidine (PEPCID) tablet 40 mg, 40 mg, Oral, Daily, Tu, Ching T, DO, 40 mg at 01/05/20 0818 .  magnesium oxide (MAG-OX) tablet 400 mg, 400 mg, Oral, QHS, Tu, Ching T, DO, 400 mg at 01/04/20 2144 .  MEDLINE mouth rinse, 15 mL, Mouth Rinse, BID, Lucky Cowboy, MD, 15 mL at 01/05/20 0820 .  nystatin (MYCOSTATIN) 100000 UNIT/ML suspension 500,000 Units, 5 mL, Mouth/Throat, QID, Lucky Cowboy, MD, 500,000 Units at 01/05/20 [phone] .  propranolol (INDERAL) tablet 20 mg, 20  mg, Oral, TID WC, Lucky Cowboy, MD, 20 mg at 01/05/20 0824 .  QUEtiapine (SEROQUEL) tablet 25 mg, 25 mg, Oral, q1800, Lucky Cowboy, MD .  risperiDONE (RISPERDAL M-TABS) disintegrating tablet 1 mg, 1 mg, Oral, Q12H PRN, Lucky Cowboy, MD, 1 mg at 01/04/20 2156 .  rosuvastatin (CRESTOR) tablet 20 mg, 20 mg, Oral, Daily, Tu, Ching T, DO, 20 mg at 01/05/20 0818  ROS:  Unable to obtain due to altered mental status.    Exam: Current vital signs: BP (!) 157/100 (BP Location: Right Arm)   Pulse 91   Temp 97.8 F (36.6 C) (Oral)   Resp 16   Ht 5\' 7"  (1.702 m)   Wt 106.2 kg   SpO2 100%   BMI 36.67 kg/m  Vital signs in last 24 hours: Temp:  [97.5 F (36.4 C)-98.6 F (37 C)] 97.8 F (36.6 C) (01/07 1352) Pulse Rate:  [69-91] 91 (01/07 1352) Resp:  [16-22] 16 (01/07 1352) BP: (132-180)/(77-100) 157/100 (01/07 1352) SpO2:  [95 %-100 %] 100 % (01/07 1352)   Constitutional: Appears well-developed and well-nourished.  Psych: Currently has significant memory decline and also seeing bugs on the wall Eyes: No scleral injection HENT: No OP obstrucion Head: Normocephalic.  Cardiovascular: Normal rate and regular rhythm.  Respiratory: Effort normal, non-labored breathing GI: Soft.  No distension. There is no tenderness.  Skin: WDI with multiple bruises  Neuro: Mental Status: Patient is awake, alert, oriented to person and able to follow simple commands.  She is not oriented to hospital and unable to provide reason for admission Speech-intact naming, repeating, comprehension of simple commands Cranial Nerves: II: Visual Fields are full.  III,IV, VI: EOMI without ptosis or diploplia. Pupils equal, round and reactive to light V: Facial sensation is symmetric to temperature VII: Facial movement is symmetric.  Hypomania VIII: hearing is intact to voice X: Palat elevates symmetrically XI: Shoulder shrug is symmetric. XII: tongue is midline without atrophy or fasciculations.   Motor: Tone is normal. Bulk is normal. 5/5 strength in upper extremities with 1/5 in bilateral lower extremities with the exception of dorsiflexion which is 4/5 Sensory: Sensation is symmetric to light touch and temperature in the arms and legs. Plantars: Toes are downgoing bilaterally.  Cerebellar: FNF intact with no dysmetria but very slow with bradykinesia  Labs I have reviewed labs in epic and the results pertinent to this consultation are:   CBC    Component Value Date/Time   WBC 9.2 01/03/2020 0229   RBC 4.34 01/03/2020 0229   HGB 12.7 01/03/2020 0229   HCT 43.1 01/03/2020 0229   PLT 273 01/03/2020 0229   MCV 99.3 01/03/2020 0229   MCH 29.3 01/03/2020 0229   MCHC 29.5 (L) 01/03/2020 0229   RDW 14.0 01/03/2020 0229  LYMPHSABS 1.5 01/02/2020 1620   MONOABS 0.8 01/02/2020 1620   EOSABS 0.6 (H) 01/02/2020 1620   BASOSABS 0.1 01/02/2020 1620    CMP     Component Value Date/Time   NA 140 01/03/2020 0229   NA 136 05/16/2013 1500   K 3.8 01/03/2020 0229   CL 106 01/03/2020 0229   CO2 21 (L) 01/03/2020 0229   GLUCOSE 90 01/03/2020 0229   BUN 13 01/03/2020 0229   BUN 26 05/16/2013 1500   CREATININE 1.34 (H) 01/03/2020 0229   CREATININE 1.18 (H) 10/27/2019 1359   CALCIUM 9.2 01/03/2020 0229   PROT 7.3 01/02/2020 1620   PROT 7.2 05/16/2013 1500   ALBUMIN 3.5 01/02/2020 1620   ALBUMIN 4.6 05/16/2013 1500   AST 25 01/02/2020 1620   ALT 24 01/02/2020 1620   ALKPHOS 110 01/02/2020 1620   BILITOT 0.6 01/02/2020 1620   GFRNONAA 40 (L) 01/03/2020 0229   GFRNONAA 47 (L) 10/27/2019 1359   GFRAA 46 (L) 01/03/2020 0229   GFRAA 54 (L) 10/27/2019 1359    Lipid Panel     Component Value Date/Time   CHOL 174 10/27/2019 1359   TRIG 137 10/27/2019 1359   HDL 83 10/27/2019 1359   CHOLHDL 2.1 10/27/2019 1359   VLDL 25 04/01/2017 1508   LDLCALC 69 10/27/2019 1359     Imaging I have reviewed the images obtained:  CT-scan of the brain-atrophy and chronic white matter  ischemia.  No acute abnormality and no change from prior studies  MRI examination of the brain-no acute cranial abnormalities, mild to moderate chronic small vessel ischemic disease and moderate cerebral atrophy  Etta Quill PA-C [facility] [phone]  M-F  (9:00 am- 5:00 PM)  01/05/2020, 2:09 PM   NEUROHOSPITALIST ADDENDUM Performed a face to face diagnostic evaluation.   I have reviewed the contents of history and physical exam as documented by PA/ARNP/Resident and agree with above documentation.  Any differences in exam has been documented in my assessment I have discussed and formulated the above plan as documented. Edits to the note have been made as needed.  Tiffany [name] is a pleasant 74 year old female with history of lung cancer, Charcot-Marie-Tooth, subclinical hypothyroidism, severe spinal stenosis at the L4-L5 with progressive lower extremity weakness admitted for shoulder pain and worsening lower extremity weakness.  Patient noted to have waxing and waning mental status, reviewing chart has been noted patient has been having increased confusion however on arrival patient was alert and oriented x3.  She did have a urinary tract infection treated with ciprofloxacin several weeks ago.  Blood pressure also slightly elevated on arrival.  However since her admission patient has been progressively more confused and now actively hallucinating with visual hallucinations.  Patient did receive Risperdal last night.  MRI brain does show moderate atrophy.,  B12, B1, TSH is low but free T4 is normal.  Given her short presentation, lower suspicion of other explanations for altered mental status such as Hashimoto's, paraneoplastic syndrome, limbic encephalitis etc.  Absence of fever would make meningoencephalitis very unlikely.    Impression Acute metabolic encephalopathy Delirium with possibly underlying dementia given moderate cerebral atrophy on MRI   Recommendations Avoid  Ativan, haldol if possible Consider Seroquel 25 mg nightly rather Risperidone  Also consider trial of Depakote 250 twice daily if no improvement with Seroquel Avoid stimulation at night Delirium can be present for several days after acute hospitalization, continue to treat as needed Outpatient neuropsych evaluation in 2 to 4 weeks to evaluate for  cognitive decline, pseudodementia may also be a possibility. Continue Valium for now to avoid withdrawal, may consider tapering to 2.5 mg daily   Neurology will continue to follow.  Karena Addison Jeanean Hollett MD [facility] [phone]   If 7pm to 7am, please call on call as listed on AMION.

## 2020-01-05 NOTE — Progress Notes (Signed)
RN noticed bloody urine in foley during rounds.  Blood tinged urine with sediment noted.  MD paged and made aware. Orders to hold lovenox.  RN will monitor.

## 2020-01-06 LAB — BASIC METABOLIC PANEL
Anion gap: 13 (ref 5–15)
BUN: 16 mg/dL (ref 8–23)
CO2: 21 mmol/L — ABNORMAL LOW (ref 22–32)
Calcium: 9 mg/dL (ref 8.9–10.3)
Chloride: 109 mmol/L (ref 98–111)
Creatinine, Ser: 1.09 mg/dL — ABNORMAL HIGH (ref 0.44–1.00)
GFR calc Af Amer: 59 mL/min — ABNORMAL LOW (ref >60)
GFR calc non Af Amer: 51 mL/min — ABNORMAL LOW (ref >60)
Glucose, Bld: 81 mg/dL (ref 70–99)
Potassium: 3.3 mmol/L — ABNORMAL LOW (ref 3.5–5.1)
Sodium: 143 mmol/L (ref 135–145)

## 2020-01-06 LAB — CBC WITH DIFFERENTIAL/PLATELET
Abs Immature Granulocytes: 0.03 10*3/uL (ref 0.00–0.07)
Basophils Absolute: 0.1 10*3/uL (ref 0.0–0.1)
Basophils Relative: 1 %
Eosinophils Absolute: 0.2 10*3/uL (ref 0.0–0.5)
Eosinophils Relative: 2 %
HCT: 40.8 % (ref 36.0–46.0)
Hemoglobin: 12.5 g/dL (ref 12.0–15.0)
Immature Granulocytes: 0 %
Lymphocytes Relative: 14 %
Lymphs Abs: 1.4 10*3/uL (ref 0.7–4.0)
MCH: 29.2 pg (ref 26.0–34.0)
MCHC: 30.6 g/dL (ref 30.0–36.0)
MCV: 95.3 fL (ref 80.0–100.0)
Monocytes Absolute: 0.8 10*3/uL (ref 0.1–1.0)
Monocytes Relative: 8 %
Neutro Abs: 7.5 10*3/uL (ref 1.7–7.7)
Neutrophils Relative %: 75 %
Platelets: 289 10*3/uL (ref 150–400)
RBC: 4.28 MIL/uL (ref 3.87–5.11)
RDW: 13.9 % (ref 11.5–15.5)
WBC: 10 10*3/uL (ref 4.0–10.5)
nRBC: 0 % (ref 0.0–0.2)

## 2020-01-06 MED ORDER — POTASSIUM CHLORIDE 20 MEQ PO PACK
40.0000 meq | PACK | Freq: Once | ORAL | Status: DC
  Filled 2020-01-06: qty 2

## 2020-01-06 NOTE — Progress Notes (Signed)
PROGRESS NOTE    Tiffany [name]  CVE:[id number] DOB: 1946/07/30 DOA: 01/02/2020   PCP: Unk Pinto, MD   Brief Narrative:  Tiffany [name] a 74 y.o.femalewith medical history significant forhistory of lung cancer, Charcot-Marie-Tooth, tremors subclinical hyperthyroidism, hypertension and hyperlipidemia presents with worsening right shoulder pain and worsening weakness. Also noted to have worsening mentation per husband. She is having progressive cognitive decline, neurology is following thinks this could be acute metabolic encephalopathy or delirium with possibly underlying dementia.  Goals of care discussed with husband.  She might not benefit from short-term rehabilitation. Palliative care consulted.   Assessment & Plan:   Principal Problem:   Weakness Active Problems:   Hypertension   Charcot's Right foot   Subclinical hyperthyroidism   Obesity   Sensory neuropathy   Shoulder arthritis   Tremor  Worsening weaknesswith hx of neuropathy/charcot's foot/severe spinal stenosis Has severe spinal stenosis of L4-L5 followed by neurosurgery- reportedly planned operation PT/OT recommend SNF- will possible need rehab placement since pt's husband is having difficulty caring for her Foley catheter placed for 2 episodes of acute urinary retention.  Acute encephalopathy  Etiology unclear. Per husband, patient's mentation has been declining rapidly in the last 3 weeks. She was found to have a UTI during this time which was treated with ciprofloxacin. It appears patient's mentation was declining prior to ciprofloxacin.  Structural abnormality ruled out: CT head and MRIbrainhere negative for acute abnormality but show moderate atrophy. TSH low (has been low since 2014), free T4 normal. Do not think this is contributing her altered mentation since this has been a chronic issue.  Phentermine, topamax discontinued Neurology consulted. Appreciate recommendations.  seroquel 25 mg at  bedtime.  Maintain day/night sleep cycle.  Avoid narcotics and benzodiazepines.   Taper down diazepam to 2.5 mg BID.   Elevated creatinine Suspect this is becoming CKD as her GFR has gradually worsened for the past 2 months -could be due to uncontrolled HTN and she is on phentermine Mobic, phentermine discontinued Creatinine improved  Hypertension  BP elevated Pt wasdocumented to have postural hypotension on 12/22 and her propranolol and enalapril were discontinued at that point.Unsure if this was ever resumed as an outpatient.  Increase enalapril dose to 20 mg daily for better BP control   Right shoulder pain X-ray with no acute fracture. Hx of right shoulder replacement and subacute healing of clavicle PRN Tylenol for pain  Subclinical hyperthyroidism  TSH has been low since 2014.  TSH of 0.142, free T4 normal   Tremors Continue propranolol and Valium (decreased dose) for tremors  Obesity Discontinue topamax, phentermine due to their propensity to cause cognitive dysfunction/ CNS side effects   Oral thrush Continue nystatin for 7 days  DVT prophylaxis: Lovenox SQ Code Status: DNR  Family Communication: husband updated at bedside  Disposition Plan:  Goals of care discussed with husband.  He wants to explore hospice options.  Palliative care consulted.   Consultants:   Neurology  Procedures: Antimicrobials: None.  Subjective: Patient was seen and examined at bedside, she smiles, follows commands,  husband was at bedside.  All questions answered.  Objective: Vitals:   01/05/20 1648 01/05/20 1742 01/05/20 2031 01/06/20 0455  BP: (!) 180/89 (!) 152/88 (!) 158/96 139/87  Pulse: 75 70 70 82  Resp: 18  18 18   Temp:   98.4 F (36.9 C) 98.7 F (37.1 C)  TempSrc:   Oral Oral  SpO2:   99% 96%  Weight:      Height:  Intake/Output Summary (Last 24 hours) at 01/06/2020 1405 Last data filed at 01/06/2020 1100 Gross per 24 hour  Intake 0 ml  Output  600 ml  Net -600 ml   Filed Weights   01/03/20 0238  Weight: 106.2 kg    Examination:  General exam: Appears calm and comfortable  Respiratory system: Clear to auscultation. Respiratory effort normal. Cardiovascular system: S1 & S2 heard, RRR. No JVD, murmurs, rubs, gallops or clicks. No pedal edema. Gastrointestinal system: Abdomen is nondistended, soft and nontender. No organomegaly or masses felt. Normal bowel sounds heard. Central nervous system: Alert and oriented x 1. No focal neurological deficits. Extremities: Significant bilateral pitting edema, tender on palpation.   Skin: No rashes, lesions or ulcers Psychiatry: Judgement and insight appear normal. Mood & affect appropriate.     Data Reviewed: I have personally reviewed following labs and imaging studies  CBC: Recent Labs  Lab 01/02/20 1620 01/03/20 0229 01/06/20 0852  WBC 8.7 9.2 10.0  NEUTROABS 5.7  --  7.5  HGB 13.1 12.7 12.5  HCT 43.6 43.1 40.8  MCV 97.5 99.3 95.3  PLT 299 273 836   Basic Metabolic Panel: Recent Labs  Lab 01/02/20 1620 01/03/20 0229 01/06/20 0347  NA 142 140 143  K 4.2 3.8 3.3*  CL 106 106 109  CO2 26 21* 21*  GLUCOSE 105* 90 81  BUN 15 13 16   CREATININE 1.56* 1.34* 1.09*  CALCIUM 9.7 9.2 9.0   GFR: Estimated Creatinine Clearance: 59.3 mL/min (A) (by C-G formula based on SCr of 1.09 mg/dL (H)). Liver Function Tests: Recent Labs  Lab 01/02/20 1620  AST 25  ALT 24  ALKPHOS 110  BILITOT 0.6  PROT 7.3  ALBUMIN 3.5   No results for input(s): LIPASE, AMYLASE in the last 168 hours. Recent Labs  Lab 01/02/20 1620  AMMONIA 13   Coagulation Profile: No results for input(s): INR, PROTIME in the last 168 hours. Cardiac Enzymes: No results for input(s): CKTOTAL, CKMB, CKMBINDEX, TROPONINI in the last 168 hours. BNP (last 3 results) No results for input(s): PROBNP in the last 8760 hours. HbA1C: No results for input(s): HGBA1C in the last 72 hours. CBG: No results for  input(s): GLUCAP in the last 168 hours. Lipid Profile: No results for input(s): CHOL, HDL, LDLCALC, TRIG, CHOLHDL, LDLDIRECT in the last 72 hours. Thyroid Function Tests: No results for input(s): TSH, T4TOTAL, FREET4, T3FREE, THYROIDAB in the last 72 hours. Anemia Panel: No results for input(s): VITAMINB12, FOLATE, FERRITIN, TIBC, IRON, RETICCTPCT in the last 72 hours. Sepsis Labs: No results for input(s): PROCALCITON, LATICACIDVEN in the last 168 hours.  Recent Results (from the past 240 hour(s))  SARS CORONAVIRUS 2 (TAT 6-24 HRS) Nasopharyngeal Nasopharyngeal Swab     Status: None   Collection Time: 01/02/20  7:13 PM   Specimen: Nasopharyngeal Swab  Result Value Ref Range Status   SARS Coronavirus 2 NEGATIVE NEGATIVE Final    Comment: (NOTE) SARS-CoV-2 target nucleic acids are NOT DETECTED. The SARS-CoV-2 RNA is generally detectable in upper and lower respiratory specimens during the acute phase of infection. Negative results do not preclude SARS-CoV-2 infection, do not rule out co-infections with other pathogens, and should not be used as the sole basis for treatment or other patient management decisions. Negative results must be combined with clinical observations, patient history, and epidemiological information. The expected result is Negative. Fact Sheet for Patients: SugarRoll.be Fact Sheet for Healthcare Providers: https://www.woods-mathews.com/ This test is not yet approved or cleared by the [place]  States FDA and  has been authorized for detection and/or diagnosis of SARS-CoV-2 by FDA under an Emergency Use Authorization (EUA). This EUA will remain  in effect (meaning this test can be used) for the duration of the COVID-19 declaration under Section 56 4(b)(1) of the Act, 21 U.S.C. section 360bbb-3(b)(1), unless the authorization is terminated or revoked sooner. Performed at [facility], [place] [address], [place],  [place] [address]      Radiology Studies: No results found.   Scheduled Meds: . aspirin EC  81 mg Oral Daily  . Chlorhexidine Gluconate Cloth  6 each Topical Daily  . diazepam  2.5 mg Oral BID  . enalapril  20 mg Oral Daily  . enoxaparin (LOVENOX) injection  40 mg Subcutaneous Q24H  . famotidine  40 mg Oral Daily  . magnesium oxide  400 mg Oral QHS  . mouth rinse  15 mL Mouth Rinse BID  . nystatin  5 mL Mouth/Throat QID  . potassium chloride  40 mEq Oral Once  . propranolol  20 mg Oral TID WC  . QUEtiapine  25 mg Oral q1800  . rosuvastatin  20 mg Oral Daily   Continuous Infusions:   LOS: 4 days    Time spent: 25 mins    Shawna Clamp, MD [facility]   If 7PM-7AM, please contact night-coverage

## 2020-01-06 NOTE — Progress Notes (Signed)
Occupational Therapy Treatment Patient Details Name: Tiffany [name] MRN: [id number] DOB: Apr 01, 1946 Today's Date: 01/06/2020    History of present illness 74 year old female admitted for weakness. She has had AMS x 1 month, charcot-Marie and uses w/c, h/o R RTSA 2/20, subacute distal R clavicle fx, arthritis, lung CA, neuropathy, HTN and multiple orthopedic sxs   OT comments  Pt progressing towards acute OT goals. Appears to be more alert this session compared to day of OT eval. Sat EOB about 10 minutes, lap slides and e/w/h ROM on RUE completed. D/c plan remains appropriate.    Follow Up Recommendations  SNF    Equipment Recommendations  None recommended by OT    Recommendations for Other Services      Precautions / Restrictions Precautions Precautions: Fall Precaution Comments: multiple falls, sling, R distal clavicle fx Required Braces or Orthoses: Sling(sling appears to be from home. unclear wearing schedule ) Restrictions Other Position/Activity Restrictions: R subacute distal clavicle fx       Mobility Bed Mobility Overal bed mobility: Needs Assistance Bed Mobility: Supine to Sit;Sit to Supine     Supine to sit: Max assist;+2 for physical assistance;+2 for safety/equipment;HOB elevated Sit to supine: +2 for physical assistance;Max assist;+2 for safety/equipment   General bed mobility comments: assist with trunk and LEs in both directions, multi-modal cues for sequencing of task, to self assist   Transfers                 General transfer comment: unable    Balance Overall balance assessment: Needs assistance Sitting-balance support: Feet supported;Single extremity supported Sitting balance-Leahy Scale: Fair Sitting balance - Comments: pt sat EOB x ~ 10 minutes participating in dynamic activities,  intermittent min assist to maintain midline--occasional posterior and R lateral lean, multi-modal cues to initate correction Postural control: Posterior  lean;Right lateral lean     Standing balance comment: unable                           ADL either performed or assessed with clinical judgement   ADL Overall ADL's : Needs assistance/impaired Eating/Feeding: Minimal assistance;Sitting;Bed level Eating/Feeding Details (indicate cue type and reason): min A to bring cup fully to mouth. Spillage noted.  Grooming: Minimal assistance                                 General ADL Comments: Pt sat EOB about 10 minutes mostly at close min guard level. min A needed during dynamic tasks and as pt fatigued.      Vision       Perception     Praxis      Cognition Arousal/Alertness: Awake/alert Behavior During Therapy: WFL for tasks assessed/performed Overall Cognitive Status: No family/caregiver present to determine baseline cognitive functioning Area of Impairment: Orientation;Following commands;Problem solving;Attention;Memory                 Orientation Level: Disoriented to;Time Current Attention Level: Sustained Memory: Decreased short-term memory Following Commands: Follows one step commands with increased time     Problem Solving: Difficulty sequencing;Requires verbal cues;Requires tactile cues General Comments: attention improved today, pt oriented to place, self         Exercises RUE: Lap slides in seated position with min - mod A for sitting balance. E/w/h ROM at bed level.    Shoulder Instructions       General Comments  Pertinent Vitals/ Pain       Pain Assessment: Faces Faces Pain Scale: Hurts even more Pain Location: right shoulder, neck Pain Descriptors / Indicators: Grimacing;Guarding Pain Intervention(s): Limited activity within patient's tolerance;Monitored during session;Repositioned  Home Living                                          Prior Functioning/Environment              Frequency  Min 2X/week        Progress Toward Goals  OT  Goals(current goals can now be found in the care plan section)  Progress towards OT goals: Progressing toward goals  Acute Rehab OT Goals Patient Stated Goal: home OT Goal Formulation: Patient unable to participate in goal setting Time For Goal Achievement: 01/17/20 Potential to Achieve Goals: Fair ADL Goals Additional ADL Goal #1: pt will perform self feeding with set up and grooming with min A for set up at supervision level with min cues per activity Additional ADL Goal #2: pt will perform gentle RUE lap slides to increase strength and ROM for adls Additional ADL Goal #3: pt will maintain static sitting for 2 minutes at min guard level for adls  Plan Discharge plan remains appropriate    Co-evaluation    PT/OT/SLP Co-Evaluation/Treatment: Yes Reason for Co-Treatment: For patient/therapist safety;To address functional/ADL transfers PT goals addressed during session: Mobility/safety with mobility;Balance OT goals addressed during session: ADL's and self-care;Strengthening/ROM      AM-PAC OT "6 Clicks" Daily Activity     Outcome Measure   Help from another person eating meals?: A Little Help from another person taking care of personal grooming?: A Little Help from another person toileting, which includes using toliet, bedpan, or urinal?: Total Help from another person bathing (including washing, rinsing, drying)?: A Lot Help from another person to put on and taking off regular upper body clothing?: A Lot Help from another person to put on and taking off regular lower body clothing?: A Lot 6 Click Score: 13    End of Session    OT Visit Diagnosis: Muscle weakness (generalized) (M62.81);History of falling (Z91.81);Repeated falls (R29.6);Unsteadiness on feet (R26.81);Cognitive communication deficit (R41.841)   Activity Tolerance Patient tolerated treatment well;Patient limited by pain   Patient Left in bed;with call bell/phone within reach;with bed alarm set   Nurse  Communication          Time: 8469-6295 OT Time Calculation (min): 23 min  Charges: OT General Charges $OT Visit: 1 Visit OT Treatments $Self Care/Home Management : 8-22 mins  Tyrone Schimke, OT Acute Rehabilitation Services Pager: [phone] Office: [phone]    Tiffany [name] 01/06/2020, 2:47 PM

## 2020-01-06 NOTE — Progress Notes (Signed)
Physical Therapy Treatment Patient Details Name: Tiffany [name] MRN: [id number] DOB: [birth year] Today's Date: 01/06/2020    History of Present Illness 74 year old female admitted for weakness. She has had AMS x 1 month, charcot-Marie and uses w/c, h/o R RTSA 2/20, subacute distal R clavicle fx, arthritis, lung CA, neuropathy, HTN and multiple orthopedic sxs    PT Comments    Pt oriented to place and self today, following one step commands consistently--overall much improved from previous PT session. Sitting balance and tolerance significantly better. See below for details.   Follow Up Recommendations  SNF     Equipment Recommendations  None recommended by PT    Recommendations for Other Services       Precautions / Restrictions Precautions Precautions: Fall Precaution Comments: multiple falls, sling, R distal clavicle fx Restrictions Weight Bearing Restrictions: No Other Position/Activity Restrictions: R subacute distal clavicle fx    Mobility  Bed Mobility Overal bed mobility: Needs Assistance Bed Mobility: Supine to Sit;Sit to Supine     Supine to sit: Max assist;+2 for physical assistance;+2 for safety/equipment;HOB elevated Sit to supine: +2 for physical assistance;Max assist;+2 for safety/equipment   General bed mobility comments: assist with trunk and LEs in both directions, multi-modal cues for sequencing of task, to self assist   Transfers                 General transfer comment: unable  Ambulation/Gait                 Stairs             Wheelchair Mobility    Modified Rankin (Stroke Patients Only)       Balance Overall balance assessment: Needs assistance Sitting-balance support: Feet supported;Single extremity supported Sitting balance-Leahy Scale: Fair Sitting balance - Comments: pt sat EOB x ~ 10 minutes participating in dynamic activities,  intermittent min assist to maintain midline--occasional posterior and R lateral  lean, multi-modal cues to initate correction Postural control: Posterior lean;Right lateral lean                                  Cognition Arousal/Alertness: Awake/alert Behavior During Therapy: WFL for tasks assessed/performed Overall Cognitive Status: No family/caregiver present to determine baseline cognitive functioning                   Orientation Level: Disoriented to;Time Current Attention Level: Sustained Memory: Decreased short-term memory Following Commands: Follows one step commands with increased time       General Comments: attention improved today, pt oriented to place, self       Exercises General Exercises - Lower Extremity Long Arc Quad: AROM;Both;10 reps;AAROM;Seated    General Comments        Pertinent Vitals/Pain Pain Assessment: Faces Faces Pain Scale: Hurts even more Pain Location: right shoulder, neck Pain Descriptors / Indicators: Grimacing;Guarding Pain Intervention(s): Limited activity within patient's tolerance;Monitored during session;Repositioned    Home Living                      Prior Function            PT Goals (current goals can now be found in the care plan section) Acute Rehab PT Goals Patient Stated Goal: home PT Goal Formulation: Patient unable to participate in goal setting Time For Goal Achievement: 01/17/20 Potential to Achieve Goals: Fair Progress towards PT goals: Progressing toward  goals    Frequency    Min 2X/week      PT Plan Current plan remains appropriate    Co-evaluation PT/OT/SLP Co-Evaluation/Treatment: Yes Reason for Co-Treatment: For patient/therapist safety;To address functional/ADL transfers PT goals addressed during session: Mobility/safety with mobility;Balance        AM-PAC PT "6 Clicks" Mobility   Outcome Measure  Help needed turning from your back to your side while in a flat bed without using bedrails?: Total Help needed moving from lying on your back  to sitting on the side of a flat bed without using bedrails?: Total Help needed moving to and from a bed to a chair (including a wheelchair)?: Total Help needed standing up from a chair using your arms (e.g., wheelchair or bedside chair)?: Total Help needed to walk in hospital room?: Total Help needed climbing 3-5 steps with a railing? : Total 6 Click Score: 6    End of Session   Activity Tolerance: Patient tolerated treatment well Patient left: in bed;with bed alarm set;with call bell/phone within reach   PT Visit Diagnosis: Other abnormalities of gait and mobility (R26.89);Muscle weakness (generalized) (M62.81);Repeated falls (R29.6)     Time: 7972-8206 PT Time Calculation (min) (ACUTE ONLY): 23 min  Charges:  $Therapeutic Activity: 8-22 mins                     Baxter Flattery, PT   Acute Rehab Dept [facility]): [phone]   01/06/2020    [facility] 01/06/2020, 12:16 PM

## 2020-01-06 NOTE — Care Management Important Message (Signed)
Important Message  Patient Details IM Letter given to Velva Harman RN Case Manager to present to the Patient Name: Tiffany [name] MRN: [id number] Date of Birth: 1946-05-08   Medicare Important Message Given:  Yes     Kerin Salen 01/06/2020, 1:37 PM

## 2020-01-07 DIAGNOSIS — Z7189 Other specified counseling: Secondary | ICD-10-CM

## 2020-01-07 DIAGNOSIS — Z515 Encounter for palliative care: Secondary | ICD-10-CM

## 2020-01-07 LAB — CBC
HCT: 41.3 % (ref 36.0–46.0)
Hemoglobin: 12.7 g/dL (ref 12.0–15.0)
MCH: 29.2 pg (ref 26.0–34.0)
MCHC: 30.8 g/dL (ref 30.0–36.0)
MCV: 94.9 fL (ref 80.0–100.0)
Platelets: 293 10*3/uL (ref 150–400)
RBC: 4.35 MIL/uL (ref 3.87–5.11)
RDW: 13.9 % (ref 11.5–15.5)
WBC: 10.1 10*3/uL (ref 4.0–10.5)
nRBC: 0 % (ref 0.0–0.2)

## 2020-01-07 LAB — BASIC METABOLIC PANEL
Anion gap: 12 (ref 5–15)
BUN: 18 mg/dL (ref 8–23)
CO2: 21 mmol/L — ABNORMAL LOW (ref 22–32)
Calcium: 9.1 mg/dL (ref 8.9–10.3)
Chloride: 111 mmol/L (ref 98–111)
Creatinine, Ser: 1.03 mg/dL — ABNORMAL HIGH (ref 0.44–1.00)
GFR calc Af Amer: >60 mL/min (ref >60)
GFR calc non Af Amer: 55 mL/min — ABNORMAL LOW (ref >60)
Glucose, Bld: 94 mg/dL (ref 70–99)
Potassium: 3.4 mmol/L — ABNORMAL LOW (ref 3.5–5.1)
Sodium: 144 mmol/L (ref 135–145)

## 2020-01-07 MED ORDER — POTASSIUM CHLORIDE 20 MEQ PO PACK
40.0000 meq | PACK | Freq: Once | ORAL | Status: AC
  Administered 2020-01-07: 40 meq via ORAL
  Filled 2020-01-07: qty 2

## 2020-01-07 NOTE — Consult Note (Signed)
Consultation Note Date: 01/07/2020   Patient Name: Tiffany [name]  DOB: May 19, 1946  MRN: [id number]  Age / Sex: 74 y.o., female  PCP: Unk Pinto, MD Referring Physician: Shawna Clamp, MD  Reason for Consultation: Establishing goals of care  HPI/Patient Profile: 74 y.o. female   admitted on 01/02/2020   Clinical Assessment and Goals of Care: 74 year old lady who lives at home with her husband.  She has a history of lung cancer, Charcot-Marie-Tooth disease, tremors, subclinical hyperthyroidism, hypertension and dyslipidemia.  Patient presented with worsening right shoulder pain and worsening generalized weakness.  Patient presented with subacute mental status decline at least since the past several weeks.  Progressive cognitive decline.  Deemed secondary to possible metabolic encephalopathy versus delirium versus possible underlying dementia.  Patient remains admitted to hospital medicine service with worsening weakness, history of neuropathy.  History of severe spinal stenosis L4-L5.  Patient was having recurrent falls at home over the course of the past several weeks.  Required Foley catheter placement due to acute urinary retention.  Had a recent urinary tract infection.  Patient with ongoing cognitive decline.  Mostly bedbound more recently.  In light of all of the above, palliative medicine consultation for goals of care and appropriate disposition options has been requested.  Patient seen and examined.  She has oral thrush and dry oral mucosa.  She responds reasonably well at least to a few questions asked.  She denies pain.  She appears with generalized weakness and deconditioning.  She has edema.  I discussed with her as well as the patient's husband Mr. Hott.  Introduced myself, palliative care as follows.  Brief life review performed.  Patient's decline process even prior to this hospitalization also  listened to in detail as described by her husband.  Most of the history obtained from her husband.  Palliative medicine is specialized medical care for people living with serious illness. It focuses on providing relief from the symptoms and stress of a serious illness. The goal is to improve quality of life for both the patient and the family.  Goals of care: Broad aims of medical therapy in relation to the patient's values and preferences. Our aim is to provide medical care aimed at enabling patients to achieve the goals that matter most to them, given the circumstances of their particular medical situation and their constraints.    NEXT OF KIN Husband, they live in [place].  Patient has 1 son and 1 stepson.  They live in [place], [place].  They are supposed to be arriving at the hospital for visitation this weekend.  SUMMARY OF RECOMMENDATIONS   Agree with DO NOT RESUSCITATE. Goals of care, disposition discussions undertaken with both patient and husband.  Subsequently, separate family meeting with husband outside the patient's room also performed.  Goals wishes and values attempted to be elicited.  Patient with ongoing functional as well as cognitive decline.  Patient with ongoing minimal to nil oral intake, lack of interest in food.  Discussed about hospice philosophy of  care.  Specifically, discussed about home with hospice versus residential hospice options and their differences.  Risks benefits discussed.  Alternatively, also discussed about SNF rehab attempt.  At this time, goals appear to be shifting in the direction of establishing comfort as a singular focus.  Hence, will request care manager consultation for discussion about residential hospice.  Briefly, choices discussed.  Husband lives in [place], [place] and is aware of [facility].  Await further input and recommendations after hospice evaluation.  Thank you for the consult.  Code Status/Advance  Care Planning:  DNR    Symptom Management:   Continue current medications, medication history reviewed  Palliative Prophylaxis:   Delirium Protocol   Psycho-social/Spiritual:   Desire for further Chaplaincy support:yes  Additional Recommendations: Education on Hospice  Prognosis:   < 2 weeks  Discharge Planning: Hospice facility      Primary Diagnoses: Present on Admission: . Hypertension . Subclinical hyperthyroidism . Charcot's Right foot . Shoulder arthritis . Obesity   I have reviewed the medical record, interviewed the patient and family, and examined the patient. The following aspects are pertinent.  Past Medical History:  Diagnosis Date  . Anemia   . Arthritis   . Cancer ([place])    lung cancer 2016  . Gait difficulty    problems with balance  . High blood pressure   . History of blood transfusion    05-14-15  . Hyperlipidemia   . Hyperthyroidism mild  . Neuropathy   . Neuropathy   . Obesity (BMI 35.0-39.9 without comorbidity)   . Pre-diabetes   . 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 Vitamin D deficiency   . Wears glasses   . Wears partial dentures    Social History   Socioeconomic History  . Marital status: Married    Spouse name: Merry Proud  . Number of children: 1  . Years of education: 70  . Highest education level: Not on file  Occupational History  . Occupation: retired  Tobacco Use  . Smoking status: Former Smoker    Packs/day: 1.00    Years: 30.00    Pack years: 30.00    Types: Cigarettes    Quit date: 05/08/2015    Years since quitting: 4.6  . Smokeless tobacco: Never Used  Substance and Sexual Activity  . Alcohol use: Yes    Alcohol/week: 2.0 standard drinks    Types: 2 Glasses of wine per week    Comment: 2 glasses wine  daily  . Drug use: No  . Sexual activity: Not on file  Other Topics Concern  . Not on file  Social History Narrative   Patient lives at home with her husband Merry Proud). Patient is retired  she use to work with insurance. Patient has a high school education.   Right handed.   Social Determinants of Health   Financial Resource Strain:   . Difficulty of Paying Living Expenses: Not on file  Food Insecurity:   . Worried About Charity fundraiser in the Last Year: Not on file  . Ran Out of Food in the Last Year: Not on file  Transportation Needs:   . Lack of Transportation (Medical): Not on file  . Lack of Transportation (Non-Medical): Not on file  Physical Activity:   . Days of Exercise per Week: Not on file  . Minutes of Exercise per Session: Not on file  Stress:   . Feeling of Stress : Not on file  Social Connections:   .  Frequency of Communication with Friends and Family: Not on file  . Frequency of Social Gatherings with Friends and Family: Not on file  . Attends Religious Services: Not on file  . Active Member of Clubs or Organizations: Not on file  . Attends Archivist Meetings: Not on file  . Marital Status: Not on file   Family History  Problem Relation Age of Onset  . Heart attack Father   . Hypertension Mother   . Breast cancer Maternal Aunt   . Colon cancer Paternal Grandmother    Scheduled Meds: . aspirin EC  81 mg Oral Daily  . Chlorhexidine Gluconate Cloth  6 each Topical Daily  . diazepam  2.5 mg Oral BID  . enalapril  20 mg Oral Daily  . enoxaparin (LOVENOX) injection  40 mg Subcutaneous Q24H  . famotidine  40 mg Oral Daily  . magnesium oxide  400 mg Oral QHS  . mouth rinse  15 mL Mouth Rinse BID  . nystatin  5 mL Mouth/Throat QID  . propranolol  20 mg Oral TID WC  . QUEtiapine  25 mg Oral q1800  . rosuvastatin  20 mg Oral Daily   Continuous Infusions: PRN Meds:.acetaminophen Medications Prior to Admission:  Prior to Admission medications   Medication Sig Start Date End Date Taking? Authorizing Provider  aspirin EC 81 MG tablet Take 81 mg by mouth daily.   Yes [provider]  chlorpheniramine (CHLOR-TRIMETON) 4 MG  tablet Take 4 mg by mouth 2 (two) times daily as needed for allergies.   Yes [provider]  Cholecalciferol (VITAMIN D3) 125 MCG (5000 UT) CAPS Take 5,000 Units by mouth 2 (two) times daily.   Yes [provider]  ciprofloxacin (CIPRO) 500 MG tablet Take 1 tablet 2 x /day with Food for UTI Patient taking differently: Take 500 mg by mouth 2 (two) times daily.  12/29/19  Yes Unk Pinto, MD  diazepam (VALIUM) 5 MG tablet Take 1 /2 to 1 tablet 3 x /day for tremor Patient taking differently: Take 5 mg by mouth 2 (two) times daily.  10/27/19  Yes Unk Pinto, MD  famotidine (PEPCID) 40 MG tablet Take 1 table Daily to Prevent Indigestion & Heartburn Patient taking differently: Take 40 mg by mouth daily.  10/15/19  Yes Unk Pinto, MD  fluticasone (FLONASE) 50 MCG/ACT nasal spray SPRAY ONE TO TWO SPRAYS IN THE AFFECTED NOSTRIL DAILY AS NEEDED FOR ALLERGY SYMPTOMS AND CONGESTION Patient taking differently: Place 1-2 sprays into both nostrils daily as needed for allergies.  09/08/19  Yes Unk Pinto, MD  Magnesium 250 MG TABS Take 250 mg by mouth at bedtime.    Yes [provider]  meloxicam (MOBIC) 15 MG tablet Take 15 mg by mouth daily. 12/01/19  Yes [provider]  Multiple Vitamins-Minerals (MULTIVITAMIN PO) Take 1 tablet by mouth at bedtime.    Yes [provider]  phentermine (ADIPEX-P) 37.5 MG tablet Take 1/2 to 1 tablet every Morning for Dieting & Weight Loss Patient taking differently: Take 37.5 mg by mouth every morning.  10/27/19  Yes Unk Pinto, MD  propranolol (INDERAL) 40 MG tablet Take 1/2 to 1 tablet 3 x /day with Meals for Tremor Patient taking differently: Take 40 mg by mouth 3 (three) times daily. Meals for Tremor 11/07/19  Yes Unk Pinto, MD  rosuvastatin (CRESTOR) 20 MG tablet Take 1 tablet daily for Cholesterol Patient taking differently: Take 20 mg by mouth daily. Take 1 tablet daily for Cholesterol  04/09/19   Yes Unk Pinto, MD  topiramate (TOPAMAX) 50 MG tablet Take 1/2 to 1 tablet 2 x /day at Suppertime & Bedtime for Dieting & Weight Loss Patient taking differently: Take 50 mg by mouth See admin instructions. Suppertime & Bedtime for Dieting & Weight Loss 10/27/19  Yes Unk Pinto, MD  enalapril (VASOTEC) 20 MG tablet Take 1 tablet Daily for BP Patient not taking: Reported on 12/08/2019 08/19/19   Unk Pinto, MD  furosemide (LASIX) 40 MG tablet Take 1 tablet Daily for BP & Fluid Retention / Ankle Swelling Patient not taking: Reported on 12/08/2019 11/14/19   Unk Pinto, MD  methylPREDNISolone (MEDROL DOSEPAK) 4 MG TBPK tablet Follow package insert Patient not taking: Reported on 01/02/2020 12/08/19   Lennice Sites, DO  nystatin (NYSTATIN) powder Apply topically 4 (four) times daily. Patient not taking: Reported on 01/02/2020 11/17/17   Unk Pinto, MD   Allergies  Allergen Reactions  . Other Other (See Comments)    Stool softeners caused stomach bloating, nausea, vomiting bleeding ulcer and blood transfusion   Review of Systems +weakness +some confusion  Physical Exam Elderly lady with generalized weakness resting in bed Has oral thrush, dry oral mucosa S1-S2 Poor inspiratory effort Abdomen is not distended Patient has significant bilateral lower extremity edema right worse than left, also has skin breakdown both lower extremities Awake, somewhat confused  Vital Signs: BP (!) 154/92 (BP Location: Right Arm)   Pulse 92   Temp 98.2 F (36.8 C) (Oral)   Resp 18   Ht 5\' 7"  (1.702 m)   Wt 106.2 kg   SpO2 96%   BMI 36.67 kg/m  Pain Scale: 0-10 POSS *See Group Information*: 1-Acceptable,Awake and alert Pain Score: Asleep   SpO2: SpO2: 96 % O2 Device:SpO2: 96 % O2 Flow Rate: .   IO: Intake/output summary:   Intake/Output Summary (Last 24 hours) at 01/07/2020 1035 Last data filed at 01/07/2020 7510 Gross per 24 hour  Intake 240 ml  Output 800 ml  Net  -560 ml    LBM: Last BM Date: 01/06/20 Baseline Weight: Weight: 106.2 kg Most recent weight: Weight: 106.2 kg     Palliative Assessment/Data:   PPS 30%  Time In:  9 Time Out:10.10   Time Total:  70  Greater than 50%  of this time was spent counseling and coordinating care related to the above assessment and plan.  Signed by: Loistine Chance, MD   Please contact Palliative Medicine Team phone at [phone] for questions and concerns.  For individual provider: See Shea Evans

## 2020-01-07 NOTE — Progress Notes (Signed)
PROGRESS NOTE    Tiffany [name]  BJY:[id number] DOB: 1946/06/30 DOA: 01/02/2020   PCP: Unk Pinto, MD   Brief Narrative:  Tiffany [name] a 74 y.o.femalewith medical history significant forhistory of lung cancer, Charcot-Marie-Tooth, tremors subclinical hyperthyroidism, hypertension and hyperlipidemia presents with worsening right shoulder pain and worsening weakness. Also noted to have worsening mentation per husband. She is having progressive cognitive decline, neurology is following thinks this could be acute metabolic encephalopathy or delirium with possibly underlying dementia.  Goals of care discussed with husband.  She might not benefit from short-term rehabilitation. Palliative care consulted. Husband agreed to explore residential hospice.   Assessment & Plan:   Principal Problem:   Weakness Active Problems:   Hypertension   Charcot's Right foot   Subclinical hyperthyroidism   Obesity   Sensory neuropathy   Shoulder arthritis   Tremor  Worsening weaknesswith hx of neuropathy/charcot's foot/severe spinal stenosis Has severe spinal stenosis of L4-L5 followed by neurosurgery- reportedly planned operation PT/OT recommend SNF- will possible need rehab placement since pt's husband is having difficulty caring for her Foley catheter placed for 2 episodes of acute urinary retention.   Acute encephalopathy -  Slowly improving. Etiology unclear. Per husband, patient's mentation has been declining rapidly in the last 3 weeks. She was found to have a UTI during this time which was treated with ciprofloxacin. It appears patient's mentation was declining prior to ciprofloxacin.  Structural abnormality ruled out: CT head and MRIbrainhere negative for acute abnormality but show moderate atrophy. TSH low (has been low since 2014), free T4 normal. Do not think this is contributing her altered mentation since this has been a chronic issue.  Phentermine, topamax  discontinued Neurology consulted. Appreciate recommendations.  seroquel 25 mg at bedtime.  Maintain day/night sleep cycle.  Avoid narcotics and benzodiazepines.   Taper down diazepam to 2.5 mg BID. Palliative care consulted, goals of care discussed with husband,  He agreed to explore residential hospice.  Elevated creatinine - Improving and back to normal Suspect this is becoming CKD as her GFR has gradually worsened for the past 2 months -could be due to uncontrolled HTN and she is on phentermine Mobic, phentermine discontinued Creatinine improved  Hypertension  - Improved. BP elevated Pt wasdocumented to have postural hypotension on 12/22 and her propranolol and enalapril were discontinued at that point.Unsure if this was ever resumed as an outpatient.  Increase enalapril dose to 20 mg daily for better BP control   Right shoulder pain X-ray with no acute fracture. Hx of right shoulder replacement and subacute healing of clavicle PRN Tylenol for pain  Subclinical hyperthyroidism  TSH has been low since 2014.  TSH of 0.142, free T4 normal   Tremors Continue propranolol and Valium (decreased dose) for tremors  Obesity Discontinue topamax, phentermine due to their propensity to cause cognitive dysfunction/ CNS side effects   Oral thrush Continue nystatin for 7 days  DVT prophylaxis: Lovenox SQ Code Status: DNR  Family Communication: husband updated at bedside  Disposition Plan:  Goals of care discussed with husband.  He wants to explore hospice options.  Palliative care consulted.   Consultants:   Neurology  Procedures: Antimicrobials: None.  Subjective: Patient was seen and examined at bedside, she smiles, follows commands,  husband was at bedside.  All questions answered.  Objective: Vitals:   01/06/20 0455 01/06/20 1436 01/06/20 2110 01/07/20 0445  BP: 139/87 (!) 160/91 (!) 156/87 (!) 154/92  Pulse: 82 88 88 92  Resp: 18 16 18  18  Temp: 98.7 F  (37.1 C) 98 F (36.7 C) 99.1 F (37.3 C) 98.2 F (36.8 C)  TempSrc: Oral Oral Oral Oral  SpO2: 96% 100% 97% 96%  Weight:      Height:        Intake/Output Summary (Last 24 hours) at 01/07/2020 1242 Last data filed at 01/07/2020 1107 Gross per 24 hour  Intake 240 ml  Output 800 ml  Net -560 ml   Filed Weights   01/03/20 0238  Weight: 106.2 kg    Examination:  General exam: Appears calm and comfortable  Respiratory system: Clear to auscultation. Respiratory effort normal. Cardiovascular system: S1 & S2 heard, RRR. No JVD, murmurs, rubs, gallops or clicks. No pedal edema. Gastrointestinal system: Abdomen is nondistended, soft and nontender. No organomegaly or masses felt. Normal bowel sounds heard. Central nervous system: Alert and oriented x 1. No focal neurological deficits. Extremities: Significant bilateral pitting edema, tender on palpation.   Skin: No rashes, lesions or ulcers Psychiatry: Judgement and insight appear normal. Mood & affect appropriate.   Data Reviewed: I have personally reviewed following labs and imaging studies  CBC: Recent Labs  Lab 01/02/20 1620 01/03/20 0229 01/06/20 0852 01/07/20 0435  WBC 8.7 9.2 10.0 10.1  NEUTROABS 5.7  --  7.5  --   HGB 13.1 12.7 12.5 12.7  HCT 43.6 43.1 40.8 41.3  MCV 97.5 99.3 95.3 94.9  PLT 299 273 289 784   Basic Metabolic Panel: Recent Labs  Lab 01/02/20 1620 01/03/20 0229 01/06/20 0347 01/07/20 0435  NA 142 140 143 144  K 4.2 3.8 3.3* 3.4*  CL 106 106 109 111  CO2 26 21* 21* 21*  GLUCOSE 105* 90 81 94  BUN 15 13 16 18   CREATININE 1.56* 1.34* 1.09* 1.03*  CALCIUM 9.7 9.2 9.0 9.1   GFR: Estimated Creatinine Clearance: 62.8 mL/min (A) (by C-G formula based on SCr of 1.03 mg/dL (H)). Liver Function Tests: Recent Labs  Lab 01/02/20 1620  AST 25  ALT 24  ALKPHOS 110  BILITOT 0.6  PROT 7.3  ALBUMIN 3.5   No results for input(s): LIPASE, AMYLASE in the last 168 hours. Recent Labs  Lab  01/02/20 1620  AMMONIA 13   Coagulation Profile: No results for input(s): INR, PROTIME in the last 168 hours. Cardiac Enzymes: No results for input(s): CKTOTAL, CKMB, CKMBINDEX, TROPONINI in the last 168 hours. BNP (last 3 results) No results for input(s): PROBNP in the last 8760 hours. HbA1C: No results for input(s): HGBA1C in the last 72 hours. CBG: No results for input(s): GLUCAP in the last 168 hours. Lipid Profile: No results for input(s): CHOL, HDL, LDLCALC, TRIG, CHOLHDL, LDLDIRECT in the last 72 hours. Thyroid Function Tests: No results for input(s): TSH, T4TOTAL, FREET4, T3FREE, THYROIDAB in the last 72 hours. Anemia Panel: No results for input(s): VITAMINB12, FOLATE, FERRITIN, TIBC, IRON, RETICCTPCT in the last 72 hours. Sepsis Labs: No results for input(s): PROCALCITON, LATICACIDVEN in the last 168 hours.  Recent Results (from the past 240 hour(s))  SARS CORONAVIRUS 2 (TAT 6-24 HRS) Nasopharyngeal Nasopharyngeal Swab     Status: None   Collection Time: 01/02/20  7:13 PM   Specimen: Nasopharyngeal Swab  Result Value Ref Range Status   SARS Coronavirus 2 NEGATIVE NEGATIVE Final    Comment: (NOTE) SARS-CoV-2 target nucleic acids are NOT DETECTED. The SARS-CoV-2 RNA is generally detectable in upper and lower respiratory specimens during the acute phase of infection. Negative results do not preclude SARS-CoV-2 infection,  do not rule out co-infections with other pathogens, and should not be used as the sole basis for treatment or other patient management decisions. Negative results must be combined with clinical observations, patient history, and epidemiological information. The expected result is Negative. Fact Sheet for Patients: SugarRoll.be Fact Sheet for Healthcare Providers: https://www.woods-mathews.com/ This test is not yet approved or cleared by the [place] FDA and  has been authorized for detection and/or  diagnosis of SARS-CoV-2 by FDA under an Emergency Use Authorization (EUA). This EUA will remain  in effect (meaning this test can be used) for the duration of the COVID-19 declaration under Section 56 4(b)(1) of the Act, 21 U.S.C. section 360bbb-3(b)(1), unless the authorization is terminated or revoked sooner. Performed at [facility], [place] [address] [place], [place] [address]      Radiology Studies: No results found.   Scheduled Meds: . aspirin EC  81 mg Oral Daily  . Chlorhexidine Gluconate Cloth  6 each Topical Daily  . diazepam  2.5 mg Oral BID  . enalapril  20 mg Oral Daily  . enoxaparin (LOVENOX) injection  40 mg Subcutaneous Q24H  . famotidine  40 mg Oral Daily  . magnesium oxide  400 mg Oral QHS  . mouth rinse  15 mL Mouth Rinse BID  . nystatin  5 mL Mouth/Throat QID  . propranolol  20 mg Oral TID WC  . QUEtiapine  25 mg Oral q1800  . rosuvastatin  20 mg Oral Daily   Continuous Infusions:   LOS: 5 days    Time spent: 25 mins    Shawna Clamp, MD [facility]   If 7PM-7AM, please contact night-coverage

## 2020-01-08 LAB — BASIC METABOLIC PANEL
Anion gap: 11 (ref 5–15)
BUN: 19 mg/dL (ref 8–23)
CO2: 20 mmol/L — ABNORMAL LOW (ref 22–32)
Calcium: 9.2 mg/dL (ref 8.9–10.3)
Chloride: 113 mmol/L — ABNORMAL HIGH (ref 98–111)
Creatinine, Ser: 0.9 mg/dL (ref 0.44–1.00)
GFR calc Af Amer: >60 mL/min (ref >60)
GFR calc non Af Amer: >60 mL/min (ref >60)
Glucose, Bld: 94 mg/dL (ref 70–99)
Potassium: 3.9 mmol/L (ref 3.5–5.1)
Sodium: 144 mmol/L (ref 135–145)

## 2020-01-08 MED ORDER — QUETIAPINE FUMARATE 25 MG PO TABS
12.5000 mg | ORAL_TABLET | Freq: Every day | ORAL | Status: DC
  Administered 2020-01-09: 12.5 mg via ORAL
  Filled 2020-01-08: qty 1

## 2020-01-08 NOTE — Progress Notes (Addendum)
Reason for consult: Altered mental status  Subjective: Patient remains significantly altered, she can state her name but is not oriented to place and cannot maintain a conversation.  According to nurses still has hallucinations. Mental status has been waxing and waning during admission  ROS:  Unable to obtain due to poor mental status  Examination  Vital signs in last 24 hours: Temp:  [97.7 F (36.5 C)-98.7 F (37.1 C)] 97.7 F (36.5 C) (01/10 1306) Pulse Rate:  [78-92] 81 (01/10 1306) Resp:  [16-17] 16 (01/10 1306) BP: (143-163)/(91-97) 163/91 (01/10 1306) SpO2:  [95 %-100 %] 100 % (01/10 1306)  General: lying in bed CVS: pulse-normal rate and rhythm RS: breathing comfortably Extremities: normal   Neuro: MS: Alert, but confused.  Oriented only to herself, can state her son's name but incorrectly stated her husband's name.  No obvious language deficit, can name objects.  Follows simple commands CN: pupils equal and reactive,  EOMI, face symmetric, tongue midline, normal sensation over face, Motor: 5/5 strength in both upper extremities, 3/5 strength in lower extremities Reflexes: 2+ bilaterally over patella, 1+ biceps, plantars: flexor Coordination: normal Gait: not tested  Basic Metabolic Panel: Recent Labs  Lab 01/02/20 1620 01/03/20 0229 01/06/20 0347 01/07/20 0435 01/08/20 0449  NA 142 140 143 144 144  K 4.2 3.8 3.3* 3.4* 3.9  CL 106 106 109 111 113*  CO2 26 21* 21* 21* 20*  GLUCOSE 105* 90 81 94 94  BUN 15 13 16 18 19   CREATININE 1.56* 1.34* 1.09* 1.03* 0.90  CALCIUM 9.7 9.2 9.0 9.1 9.2    CBC: Recent Labs  Lab 01/02/20 1620 01/03/20 0229 01/06/20 0852 01/07/20 0435  WBC 8.7 9.2 10.0 10.1  NEUTROABS 5.7  --  7.5  --   HGB 13.1 12.7 12.5 12.7  HCT 43.6 43.1 40.8 41.3  MCV 97.5 99.3 95.3 94.9  PLT 299 273 289 293     Coagulation Studies: No results for input(s): LABPROT, INR in the last 72 hours.  Imaging Reviewed:     ASSESSMENT AND  PLAN  74 year old female with history of lung cancer, Charcot-Marie-Tooth, subclinical hypothyroidism, severe spinal stenosis at the L4-L5 with progressive lower extremity weakness admitted for shoulder pain and worsening lower extremity weakness.  Patient noted to have waxing and waning mental status, reviewing chart has been noted patient has been having increased confusion however on arrival patient was alert and oriented x3.  She did have a urinary tract infection treated with ciprofloxacin several weeks ago.  Blood pressure also slightly elevated on arrival.  However since her admission patient has been progressively more confused and now actively hallucinating with visual hallucinations.  Patient did receive Risperdal last night.  MRI brain does show moderate atrophy.,  B12, B1, TSH is low but free T4 is normal.  Rare explanations for altered mental status such as Hashimoto's, paraneoplastic syndrome, limbic encephalitis etc.  felt to be less likely due to really short presentation, as well as stroke very recent UTI and hospitalization.  However due to persistent encephalopathy, I do think it is reasonable to look for less obvious causes for patient's altered mental status.   6weeks - weird dreams - started trouble following directions - worsening gait, increased falls   t4 .91 TSH  0.42 ( low) b1 215.6  B12 841  Acute metabolic encephalopathy vs encephalitis vs Deliurium Urinary tract infection, completed antibiotics   Recommendations Reduce quetiapine to 12.5 mg daily Continue Valium at reduced dose of 2.5 mg daily  to avoid withdrawal MRI brain w contrast  LP under fluoroscopy to check cell count, protein, glucose, paraneoplastic panel ( [facility]) Check anti TPO antibodies Routine EEG   Will hold of emperic IVIG/steroids until above studies   Cy Bresee [facility] Pager Number [phone] For questions after 7pm please refer to AMION to reach the  Neurologist on call

## 2020-01-08 NOTE — TOC Progression Note (Signed)
Transition of Care [facility]) - Progression Note    Patient Details  Name: CHANDA LAPERLE MRN: [id number] Date of Birth: 11/13/1946  Transition of Care [facility]) CM/SW Contact  Joaquin Courts, RN Phone Number: 01/08/2020, 10:55 AM  Clinical Narrative:    Referral given to [facility] rep for beacon bed. Per rep no beacon beds are available today.    Expected Discharge Plan: [place] Barriers to Discharge: Continued Medical Work up  Expected Discharge Plan and Services Expected Discharge Plan: [place] Choice: [place] arrangements for the past 2 months: Single Family Home                 DME Arranged: N/A DME Agency: NA       HH Arranged: NA HH Agency: NA         Social Determinants of Health (SDOH) Interventions    Readmission Risk Interventions No flowsheet data found.

## 2020-01-08 NOTE — Progress Notes (Signed)
Daily Progress Note   Patient Name: Tiffany [name]       Date: 01/08/2020 DOB: [birth year]  Age: 74 y.o. MRN#: [id number] Attending Physician: Shawna Clamp, MD Primary Care Physician: Unk Pinto, MD Admit Date: 01/02/2020  Reason for Consultation/Follow-up: Establishing goals of care  Subjective:  overall condition unchanged from yesterday's initial PMT evaluation, husband at bedside, patient with generalized weakness, deconditioning.   Length of Stay: 6  Current Medications: Scheduled Meds:   aspirin EC  81 mg Oral Daily   Chlorhexidine Gluconate Cloth  6 each Topical Daily   diazepam  2.5 mg Oral BID   enalapril  20 mg Oral Daily   enoxaparin (LOVENOX) injection  40 mg Subcutaneous Q24H   famotidine  40 mg Oral Daily   magnesium oxide  400 mg Oral QHS   mouth rinse  15 mL Mouth Rinse BID   nystatin  5 mL Mouth/Throat QID   propranolol  20 mg Oral TID WC   QUEtiapine  25 mg Oral q1800   rosuvastatin  20 mg Oral Daily    Continuous Infusions:   PRN Meds: acetaminophen  Physical Exam         Elderly lady with generalized weakness resting in bed Has oral thrush, dry oral mucosa S1-S2 Poor inspiratory effort Abdomen is not distended Patient has significant bilateral lower extremity edema right worse than left, also has skin breakdown both lower extremities Awake, somewhat confused Vital Signs: BP (!) 156/92    Pulse 92    Temp 98.7 F (37.1 C) (Oral)    Resp 17    Ht 5\' 7"  (1.702 m)    Wt 106.2 kg    SpO2 97%    BMI 36.67 kg/m  SpO2: SpO2: 97 % O2 Device: O2 Device: Room Air O2 Flow Rate:    Intake/output summary:   Intake/Output Summary (Last 24 hours) at 01/08/2020 1049 Last data filed at 01/08/2020 0748 Gross per 24 hour  Intake 240 ml  Output  1075 ml  Net -835 ml   LBM: Last BM Date: 01/07/20 Baseline Weight: Weight: 106.2 kg Most recent weight: Weight: 106.2 kg       Palliative Assessment/Data:      Patient Active Problem List   Diagnosis Date Noted   Weakness 01/02/2020   Tremor 01/02/2020   Shoulder  arthritis 02/10/2019   Other chronic pain 01/24/2019   Sensory neuropathy 01/24/2019   Senile purpura ([place]) 10/18/2018   Unilateral primary osteoarthritis, left knee 07/21/2018   Obesity 06/27/2018   Peripheral neuropathy ([place]) 08/27/2016   GERD (gastroesophageal reflux disease) 03/27/2016   Subclinical hyperthyroidism 08/18/2015   Gastric ulcer due to nonsteroidal antiinflammatory drug (NSAID) therapy    Lung cancer ([place]) 05/09/2015   Peripheral edema 05/01/2015   Abnormal glucose 07/18/2014   Vitamin D deficiency 07/18/2014   Medication management 07/18/2014   Hyperlipidemia, mixed 04/04/2014   Charcot's Right foot 06/15/2013   Hypertension     Palliative Care Assessment & Plan   Patient Profile:  74 year old lady who lives at home with her husband.  She has a history of lung cancer, Charcot-Marie-Tooth disease, tremors, subclinical hyperthyroidism, hypertension and dyslipidemia.  Patient presented with worsening right shoulder pain and worsening generalized weakness.  Patient presented with subacute mental status decline at least since the past several weeks.  Progressive cognitive decline.  Deemed secondary to possible metabolic encephalopathy versus delirium versus possible underlying dementia.  Patient remains admitted to hospital medicine service with worsening weakness, history of neuropathy.  History of severe spinal stenosis L4-L5.  Patient was having recurrent falls at home over the course of the past several weeks.  Required Foley catheter placement due to acute urinary retention.  Had a recent urinary tract infection.  Patient with ongoing cognitive decline.  Mostly bedbound more  recently.  In light of all of the above, palliative medicine consultation for goals of care and appropriate disposition options has been requested.  Assessment: Generalized weakness Failure to thrive Poor PO intake Mild generalized pain.   Recommendations/Plan:  Residential hospice   Code Status:    Code Status Orders  (From admission, onward)         Start     Ordered   01/05/20 1034  Do not attempt resuscitation (DNR)  Continuous    Question Answer Comment  In the event of cardiac or respiratory ARREST Do not call a code blue   In the event of cardiac or respiratory ARREST Do not perform Intubation, CPR, defibrillation or ACLS   In the event of cardiac or respiratory ARREST Use medication by any route, position, wound care, and other measures to relive pain and suffering. May use oxygen, suction and manual treatment of airway obstruction as needed for comfort.      01/05/20 1033        Code Status History    Date Active Date Inactive Code Status Order ID Comments User Context   01/02/2020 2037 01/05/2020 1033 Full Code [id number]  Orene Desanctis, DO ED   02/10/2019 1847 02/11/2019 1539 Full Code [id number]  Meredith Pel, MD Inpatient   07/13/2015 1230 07/16/2015 1449 Full Code [id number]  Mcarthur Rossetti, MD Inpatient   05/09/2015 2059 05/18/2015 1659 Full Code [id number]  Coolidge Breeze, PA-C Inpatient   Advance Care Planning Activity       Prognosis:   < 2 weeks  Discharge Planning:  Hospice facility  Care plan was discussed with         Thank you for allowing the Palliative Medicine Team to assist in the care of this patient.   Time In:  10 Time Out: 10.25 Total Time 25 Prolonged Time Billed  no       Greater than 50%  of this time was spent counseling and coordinating care related to the above assessment and plan.  Loistine Chance, MD  Please contact Palliative Medicine Team phone at [phone] for questions and concerns.

## 2020-01-08 NOTE — Progress Notes (Signed)
PROGRESS NOTE    Tiffany [name]  FTD:[id number] DOB: 03-11-1946 DOA: 01/02/2020   PCP: Unk Pinto, MD   Brief Narrative:  Tiffany [name] a 74 y.o.femalewith medical history significant forhistory of lung cancer, Charcot-Marie-Tooth, tremors subclinical hyperthyroidism, hypertension and hyperlipidemia presents with worsening right shoulder pain and worsening weakness. Also noted to have worsening mentation per husband. She is having progressive cognitive decline, neurology is following,  thinks this could be acute metabolic encephalopathy or delirium with possibly underlying dementia.  Patient mental status hasn't improved with course of antibiotics, Goals of care discussed with husband.  She might not benefit from short-term rehabilitation. Palliative care consulted. Husband agreed to explore residential hospice awaiting placement.   Assessment & Plan:   Principal Problem:   Weakness Active Problems:   Hypertension   Charcot's Right foot   Subclinical hyperthyroidism   Obesity   Sensory neuropathy   Shoulder arthritis   Tremor  Worsening weaknesswith hx of neuropathy/charcot's foot/severe spinal stenosis Has severe spinal stenosis of L4-L5 followed by neurosurgery- reportedly planned operation PT/OT recommend SNF- will possible need rehab placement since pt's husband is having difficulty caring for her Foley catheter placed for 2 episodes of acute urinary retention.  Acute encephalopathy -  Still has waxing and waning of mental status. Etiology unclear. Per husband, patient's mentation has been declining rapidly in the last 3 weeks. She was found to have a UTI during this time which was treated with ciprofloxacin. It appears patient's mentation was declining prior to ciprofloxacin.  Structural abnormality ruled out: CT head and MRIbrainhere negative for acute abnormality but show moderate atrophy. TSH low (has been low since 2014), free T4 normal. Do not think this  is contributing her altered mentation since this has been a chronic issue.  Phentermine, topamax discontinued Neurology consulted. Appreciate recommendations. Thinks it is delirium , it might improve. seroquel 25 mg at bedtime.  Maintain day/night sleep cycle.  Avoid narcotics and benzodiazepines.   Taper down diazepam to 2.5 mg BID. Palliative care consulted, goals of care discussed with husband,  He agreed to explore residential hospice.  Elevated creatinine - Improving and back to normal Suspect this is becoming CKD as her GFR has gradually worsened for the past 2 months -could be due to uncontrolled HTN and she is on phentermine Mobic, phentermine discontinued Creatinine improved  Hypertension  - Improved. BP elevated Pt wasdocumented to have postural hypotension on 12/22 and her propranolol and enalapril were discontinued at that point. Unsure if this was ever resumed as an outpatient.  Increase enalapril dose to 20 mg daily for better BP control   Right shoulder pain X-ray with no acute fracture. Hx of right shoulder replacement and subacute healing of clavicle PRN Tylenol for pain  Subclinical hyperthyroidism  TSH has been low since 2014.  TSH of 0.142, free T4 normal   Tremors Continue propranolol and Valium (decreased dose) for tremors  Obesity Discontinue topamax, phentermine due to their propensity to cause cognitive dysfunction/ CNS side effects   Oral thrush Continue nystatin for 7 days  DVT prophylaxis: Lovenox SQ Code Status: DNR  Family Communication: husband updated at bedside  Disposition Plan:  Goals of care discussed with husband.  He wants to explore hospice options.  Palliative care consulted.   Consultants:   Neurology  Procedures: Antimicrobials: None.  Subjective: Patient was seen and examined at bedside, she follows commands,  husband was at bedside.  All questions answered.  Objective: Vitals:   01/07/20 1822 01/07/20 2035  01/08/20 0518 01/08/20 1306  BP: (!) 156/95 (!) 143/97 (!) 156/92 (!) 163/91  Pulse: 78 84 92 81  Resp:  17 17 16   Temp:  97.7 F (36.5 C) 98.7 F (37.1 C) 97.7 F (36.5 C)  TempSrc:  Oral Oral Oral  SpO2:  95% 97% 100%  Weight:      Height:        Intake/Output Summary (Last 24 hours) at 01/08/2020 1329 Last data filed at 01/08/2020 1242 Gross per 24 hour  Intake 720 ml  Output 1175 ml  Net -455 ml   Filed Weights   01/03/20 0238  Weight: 106.2 kg    Examination:  General exam: Appears calm and comfortable  Respiratory system: Clear to auscultation. Respiratory effort normal. Cardiovascular system: S1 & S2 heard, RRR. No JVD, murmurs, rubs, gallops or clicks. No pedal edema. Gastrointestinal system: Abdomen is nondistended, soft and nontender. No organomegaly or masses felt. Normal bowel sounds heard. Central nervous system: Alert and oriented x 1. No focal neurological deficits. Extremities: Significant bilateral pitting edema, tender on palpation.   Skin: No rashes, lesions or ulcers Psychiatry: Judgement and insight appear normal. Mood & affect appropriate.   Data Reviewed: I have personally reviewed following labs and imaging studies  CBC: Recent Labs  Lab 01/02/20 1620 01/03/20 0229 01/06/20 0852 01/07/20 0435  WBC 8.7 9.2 10.0 10.1  NEUTROABS 5.7  --  7.5  --   HGB 13.1 12.7 12.5 12.7  HCT 43.6 43.1 40.8 41.3  MCV 97.5 99.3 95.3 94.9  PLT 299 273 289 638   Basic Metabolic Panel: Recent Labs  Lab 01/02/20 1620 01/03/20 0229 01/06/20 0347 01/07/20 0435 01/08/20 0449  NA 142 140 143 144 144  K 4.2 3.8 3.3* 3.4* 3.9  CL 106 106 109 111 113*  CO2 26 21* 21* 21* 20*  GLUCOSE 105* 90 81 94 94  BUN 15 13 16 18 19   CREATININE 1.56* 1.34* 1.09* 1.03* 0.90  CALCIUM 9.7 9.2 9.0 9.1 9.2   GFR: Estimated Creatinine Clearance: 71.9 mL/min (by C-G formula based on SCr of 0.9 mg/dL). Liver Function Tests: Recent Labs  Lab 01/02/20 1620  AST 25  ALT 24    ALKPHOS 110  BILITOT 0.6  PROT 7.3  ALBUMIN 3.5   No results for input(s): LIPASE, AMYLASE in the last 168 hours. Recent Labs  Lab 01/02/20 1620  AMMONIA 13   Coagulation Profile: No results for input(s): INR, PROTIME in the last 168 hours. Cardiac Enzymes: No results for input(s): CKTOTAL, CKMB, CKMBINDEX, TROPONINI in the last 168 hours. BNP (last 3 results) No results for input(s): PROBNP in the last 8760 hours. HbA1C: No results for input(s): HGBA1C in the last 72 hours. CBG: No results for input(s): GLUCAP in the last 168 hours. Lipid Profile: No results for input(s): CHOL, HDL, LDLCALC, TRIG, CHOLHDL, LDLDIRECT in the last 72 hours. Thyroid Function Tests: No results for input(s): TSH, T4TOTAL, FREET4, T3FREE, THYROIDAB in the last 72 hours. Anemia Panel: No results for input(s): VITAMINB12, FOLATE, FERRITIN, TIBC, IRON, RETICCTPCT in the last 72 hours. Sepsis Labs: No results for input(s): PROCALCITON, LATICACIDVEN in the last 168 hours.  Recent Results (from the past 240 hour(s))  SARS CORONAVIRUS 2 (TAT 6-24 HRS) Nasopharyngeal Nasopharyngeal Swab     Status: None   Collection Time: 01/02/20  7:13 PM   Specimen: Nasopharyngeal Swab  Result Value Ref Range Status   SARS Coronavirus 2 NEGATIVE NEGATIVE Final    Comment: (NOTE) SARS-CoV-2 target  nucleic acids are NOT DETECTED. The SARS-CoV-2 RNA is generally detectable in upper and lower respiratory specimens during the acute phase of infection. Negative results do not preclude SARS-CoV-2 infection, do not rule out co-infections with other pathogens, and should not be used as the sole basis for treatment or other patient management decisions. Negative results must be combined with clinical observations, patient history, and epidemiological information. The expected result is Negative. Fact Sheet for Patients: SugarRoll.be Fact Sheet for Healthcare  Providers: https://www.woods-mathews.com/ This test is not yet approved or cleared by the [place] FDA and  has been authorized for detection and/or diagnosis of SARS-CoV-2 by FDA under an Emergency Use Authorization (EUA). This EUA will remain  in effect (meaning this test can be used) for the duration of the COVID-19 declaration under Section 56 4(b)(1) of the Act, 21 U.S.C. section 360bbb-3(b)(1), unless the authorization is terminated or revoked sooner. Performed at [facility], [place] [address], [place], [place] [address]      Radiology Studies: No results found.   Scheduled Meds: . aspirin EC  81 mg Oral Daily  . Chlorhexidine Gluconate Cloth  6 each Topical Daily  . diazepam  2.5 mg Oral BID  . enalapril  20 mg Oral Daily  . enoxaparin (LOVENOX) injection  40 mg Subcutaneous Q24H  . famotidine  40 mg Oral Daily  . magnesium oxide  400 mg Oral QHS  . mouth rinse  15 mL Mouth Rinse BID  . nystatin  5 mL Mouth/Throat QID  . propranolol  20 mg Oral TID WC  . QUEtiapine  25 mg Oral q1800  . rosuvastatin  20 mg Oral Daily   Continuous Infusions:   LOS: 6 days    Time spent: 25 mins    Shawna Clamp, MD [facility]   If 7PM-7AM, please contact night-coverage

## 2020-01-08 NOTE — Progress Notes (Addendum)
Risk manager received referral for pt to transfer to [facility] for EOL care. Pt's chart currently in review by [facility] to review eligibility.   Writer spoke with pt's husband to confirm interest. Explained that there are not currently any available beds but that once pt is determined to be eligible, she would be on the list for transfer and both he and hospital staff will be notified of eligibility and bed availability as soon as it is determined.   Please do not hesitate to outreach with any questions in the interim and thank you for the referral.  Freddie Breech, RN [facility] Liaison [phone]

## 2020-01-09 ENCOUNTER — Inpatient Hospital Stay (HOSPITAL_COMMUNITY)
Admit: 2020-01-09 | Discharge: 2020-01-09 | Disposition: A | Discharge status: Home / Self Care | Payer: PPO | Attending: Neurology | Admitting: Neurology

## 2020-01-09 ENCOUNTER — Inpatient Hospital Stay (HOSPITAL_COMMUNITY): Payer: PPO

## 2020-01-09 LAB — BASIC METABOLIC PANEL
Anion gap: 13 (ref 5–15)
BUN: 20 mg/dL (ref 8–23)
CO2: 20 mmol/L — ABNORMAL LOW (ref 22–32)
Calcium: 9.4 mg/dL (ref 8.9–10.3)
Chloride: 114 mmol/L — ABNORMAL HIGH (ref 98–111)
Creatinine, Ser: 0.89 mg/dL (ref 0.44–1.00)
GFR calc Af Amer: >60 mL/min (ref >60)
GFR calc non Af Amer: >60 mL/min (ref >60)
Glucose, Bld: 86 mg/dL (ref 70–99)
Potassium: 3.3 mmol/L — ABNORMAL LOW (ref 3.5–5.1)
Sodium: 147 mmol/L — ABNORMAL HIGH (ref 135–145)

## 2020-01-09 MED ORDER — POTASSIUM CHLORIDE CRYS ER 20 MEQ PO TBCR
40.0000 meq | EXTENDED_RELEASE_TABLET | Freq: Once | ORAL | Status: AC
  Administered 2020-01-09: 40 meq via ORAL
  Filled 2020-01-09: qty 2

## 2020-01-09 MED ORDER — GADOBUTROL 1 MMOL/ML IV SOLN
7.5000 mL | Freq: Once | INTRAVENOUS | Status: AC | PRN
  Administered 2020-01-09: 7.5 mL via INTRAVENOUS

## 2020-01-09 MED ORDER — ENOXAPARIN SODIUM 40 MG/0.4ML ~~LOC~~ SOLN
40.0000 mg | SUBCUTANEOUS | Status: DC
  Administered 2020-01-10 – 2020-01-11 (×2): 40 mg via SUBCUTANEOUS
  Filled 2020-01-09 (×2): qty 0.4

## 2020-01-09 NOTE — Progress Notes (Signed)
EEG Completed; Results Pending  

## 2020-01-09 NOTE — Procedures (Signed)
Attempted lumbar puncture at L2/3 and L3/4 was unsuccessful, with insufficient CSF return at both levels to collect samples. Details dictated in Radiology report.

## 2020-01-09 NOTE — Progress Notes (Signed)
Daily Progress Note   Patient Name: Tiffany [name]       Date: 01/09/2020 DOB: February 19, 1946  Age: 74 y.o. MRN#: [id number] Attending Physician: Lavina Hamman, MD Primary Care Physician: Unk Pinto, MD Admit Date: 01/02/2020  Reason for Consultation/Follow-up: Establishing goals of care  Subjective:  overall condition remains the same, patient with weakness, confusion, deconditioning. Neurology note from 1-10 reviewed, it appears that LP,MRI brain and EEG have been ordered. Husband at bedside.    Length of Stay: 7  Current Medications: Scheduled Meds:  . aspirin EC  81 mg Oral Daily  . Chlorhexidine Gluconate Cloth  6 each Topical Daily  . diazepam  2.5 mg Oral BID  . enalapril  20 mg Oral Daily  . famotidine  40 mg Oral Daily  . magnesium oxide  400 mg Oral QHS  . mouth rinse  15 mL Mouth Rinse BID  . nystatin  5 mL Mouth/Throat QID  . propranolol  20 mg Oral TID WC  . QUEtiapine  12.5 mg Oral q1800  . rosuvastatin  20 mg Oral Daily    Continuous Infusions:   PRN Meds: acetaminophen  Physical Exam         Elderly lady with generalized weakness resting in bed  S1-S2 Poor inspiratory effort Abdomen is not distended Patient has significant bilateral lower extremity edema right worse than left, also has skin breakdown both lower extremities Awake, somewhat confused Vital Signs: BP 128/89 (BP Location: Right Arm)   Pulse 85   Temp 99 F (37.2 C) (Oral)   Resp 16   Ht 5\' 7"  (1.702 m)   Wt 106.2 kg   SpO2 99%   BMI 36.67 kg/m  SpO2: SpO2: 99 % O2 Device: O2 Device: Room Air O2 Flow Rate: O2 Flow Rate (L/min): 0 L/min  Intake/output summary:   Intake/Output Summary (Last 24 hours) at 01/09/2020 1055 Last data filed at 01/09/2020 9977 Gross per 24 hour  Intake  660 ml  Output 1300 ml  Net -640 ml   LBM: Last BM Date: 01/07/20 Baseline Weight: Weight: 106.2 kg Most recent weight: Weight: 106.2 kg       Palliative Assessment/Data:      Patient Active Problem List   Diagnosis Date Noted  . Weakness 01/02/2020  . Tremor 01/02/2020  . Shoulder arthritis  02/10/2019  . Other chronic pain 01/24/2019  . Sensory neuropathy 01/24/2019  . Senile purpura ([place]) 10/18/2018  . Unilateral primary osteoarthritis, left knee 07/21/2018  . Obesity 06/27/2018  . Peripheral neuropathy ([place]) 08/27/2016  . GERD (gastroesophageal reflux disease) 03/27/2016  . Subclinical hyperthyroidism 08/18/2015  . Gastric ulcer due to nonsteroidal antiinflammatory drug (NSAID) therapy   . Lung cancer ([place]) 05/09/2015  . Peripheral edema 05/01/2015  . Abnormal glucose 07/18/2014  . Vitamin D deficiency 07/18/2014  . Medication management 07/18/2014  . Hyperlipidemia, mixed 04/04/2014  . Charcot's Right foot 06/15/2013  . Hypertension     Palliative Care Assessment & Plan   Patient Profile:  74 year old lady who lives at home with her husband.  She has a history of lung cancer, Charcot-Marie-Tooth disease, tremors, subclinical hyperthyroidism, hypertension and dyslipidemia.  Patient presented with worsening right shoulder pain and worsening generalized weakness.  Patient presented with subacute mental status decline at least since the past several weeks.  Progressive cognitive decline.  Deemed secondary to possible metabolic encephalopathy versus delirium versus possible underlying dementia.  Patient remains admitted to hospital medicine service with worsening weakness, history of neuropathy.  History of severe spinal stenosis L4-L5.  Patient was having recurrent falls at home over the course of the past several weeks.  Required Foley catheter placement due to acute urinary retention.  Had a recent urinary tract infection.  Patient with ongoing cognitive decline.   Mostly bedbound more recently.  In light of all of the above, palliative medicine consultation for goals of care and appropriate disposition options has been requested.  Assessment: Generalized weakness Failure to thrive Poor PO intake Mild generalized pain.   Recommendations/Plan: As per neurology recommendations, MRI brain, EEG and LP have been ordered. Discussed with husband about the patient's current condition and also about disposition options. He wishes to see results of above mentioned work up and for final recommendations from neurology, and if after additional neuro work up is completed and if it appears that residential hospice would still be most beneficial, then, at that time, patient's husband is willing to discuss further with hospice liaisons.   PMT to follow.    Code Status:    Code Status Orders  (From admission, onward)         Start     Ordered   01/05/20 1034  Do not attempt resuscitation (DNR)  Continuous    Question Answer Comment  In the event of cardiac or respiratory ARREST Do not call a "code blue"   In the event of cardiac or respiratory ARREST Do not perform Intubation, CPR, defibrillation or ACLS   In the event of cardiac or respiratory ARREST Use medication by any route, position, wound care, and other measures to relive pain and suffering. May use oxygen, suction and manual treatment of airway obstruction as needed for comfort.      01/05/20 1033        Code Status History    Date Active Date Inactive Code Status Order ID Comments User Context   01/02/2020 2037 01/05/2020 1033 Full Code [id number]  Orene Desanctis, DO ED   02/10/2019 1847 02/11/2019 1539 Full Code [id number]  Meredith Pel, MD Inpatient   07/13/2015 1230 07/16/2015 1449 Full Code [id number]  Mcarthur Rossetti, MD Inpatient   05/09/2015 2059 05/18/2015 1659 Full Code [id number]  Coolidge Breeze, PA-C Inpatient   Advance Care Planning Activity       Prognosis:   guarded    Discharge  Planning:  Hospice facility likely after additional work up. PMT to follow.   Care plan was discussed with         Thank you for allowing the Palliative Medicine Team to assist in the care of this patient.   Time In:  10 Time Out: 10.25 Total Time 25 Prolonged Time Billed  no       Greater than 50%  of this time was spent counseling and coordinating care related to the above assessment and plan.  Loistine Chance, MD  Please contact Palliative Medicine Team phone at [phone] for questions and concerns.

## 2020-01-09 NOTE — Progress Notes (Signed)
Pt unavailable as going for other procedures; will do EEG when schedule permits.

## 2020-01-09 NOTE — Progress Notes (Signed)
Manufacturing engineer [facility]) RN Note  Spoke with patient spouse this morning to offer available [facility] bed to patient. Spouse declined transfer for today after speaking with MD on Sunday about continued assessment and possible testing this week. Listened and supported patient spouse as he expressed concerns about wanting to discover best treatment plan for his wife. Patient spouse understands that [facility] is unable to hold bed for patient at this time and to reach out to [facility] TOC/CM if future support of Hospice is needed. Patient spouse has [facility] contact information as well.  Made TOC/CM Velva Harman aware of above.    Please call for any Hospice related questions/concerns.  Gar Ponto, RN [facility] Liaison (on [place]) [phone]

## 2020-01-09 NOTE — Procedures (Signed)
Patient Name: MAIRELY FOXWORTH  MRN: [id number]  Epilepsy Attending: Lora Havens  Referring Physician/Provider: Dr. Karena Addison Aroor Date: 01/09/2020  Duration: 2  Patient history: 75 year old female with altered mental status.  EEG to evaluate for seizures.  Level of alertness:   AEDs during EEG study: Valium  Technical aspects: This EEG study was done with scalp electrodes positioned according to the 10-20 International system of electrode placement. Electrical activity was acquired at a sampling rate of 500Hz  and reviewed with a high frequency filter of 70Hz  and a low frequency filter of 1Hz . EEG data were recorded continuously and digitally stored.   Description: During awake state, no clear posterior dominant rhythm was seen.  EEG showed continuous generalized 3 to 6 Hz theta-delta slowing as well as intermittent generalized 2 to 3 Hz rhythmic delta slowing. Hyperventilation and photic stimulation were not performed.  Abnormality -Continuous slow, generalized -Intermittent rhythmic delta slow, generalized  IMPRESSION: This study is suggestive of moderate diffuse encephalopathy, nonspecific to etiology. No seizures or epileptiform discharges were seen throughout the recording.  Laurielle Selmon Barbra Sarks

## 2020-01-09 NOTE — Progress Notes (Addendum)
PROGRESS NOTE    Tiffany ANGE  GUR:[id number] DOB: 11/10/1946 DOA: 01/02/2020   PCP: Unk Pinto, MD   Brief Narrative:  Tiffany [name] a 74 y.o.femalewith medical history significant forhistory of lung cancer, Charcot-Marie-Tooth, tremors subclinical hyperthyroidism, hypertension and hyperlipidemia presents with worsening right shoulder pain and worsening weakness. Also noted to have worsening mentation per husband. She is having progressive cognitive decline, neurology is following,  thinks this could be acute metabolic encephalopathy or delirium with possibly underlying dementia.  Patient mental status hasn't improved with course of antibiotics, Goals of care discussed with husband.  She might not benefit from short-term rehabilitation. Palliative care consulted. Husband agreed to explore residential hospice awaiting placement.  But currently wants to pursue further diagnostic work-up.   Assessment & Plan:   Principal Problem:   Weakness Active Problems:   Hypertension   Charcot's Right foot   Subclinical hyperthyroidism   Obesity   Sensory neuropathy   Shoulder arthritis   Tremor  Acute metabolic encephalopathy -  Still has waxing and waning of mental status. Delirium Etiology unclear. Per husband, patient's mentation has been declining rapidly in the last 3 weeks. She was found to have a UTI during this time which was treated with ciprofloxacin. It appears patient's mentation was declining prior to ciprofloxacin.  Structural abnormality ruled out: CT head and MRIbrainhere negative for acute abnormality but show moderate atrophy. TSH low (has been low since 2014), free T4 normal. Do not think this is contributing her altered mentation since this has been a chronic issue.  Phentermine, topamax discontinued Neurology consulted. Appreciate recommendations. Thinks it is delirium , it might improve. seroquel 25 mg at bedtime.  Maintain day/night sleep cycle.  Avoid  narcotics and benzodiazepines.   Taper down diazepam to 2.5 mg BID. Palliative care consulted, goals of care discussed with husband,  He agreed to explore residential hospice. Prior to considering residential hospice husband wants to pursue some diagnostic work-up.  MRI brain with contrast, LP as well as EEG ordered.  TPO antibodies currently pending. Speech therapy consult due to dysphagia.  Acute kidney injury Elevated creatinine - Improving and back to normal Suspect this is becoming CKD as her GFR has gradually worsened for the past 2 months -could be due to uncontrolled HTN and she is on phentermine Mobic, phentermine discontinued Creatinine improved  Hypertension  - Improved. BP elevated Pt wasdocumented to have postural hypotension on 12/22 and her propranolol and enalapril were discontinued at that point. Unsure if this was ever resumed as an outpatient.  Increase enalapril dose to 20 mg daily for better BP control   Right shoulder pain X-ray with no acute fracture. Hx of right shoulder replacement and subacute healing of clavicle PRN Tylenol for pain  Subclinical hyperthyroidism  TSH has been low since 2014.  TSH of 0.142, free T4 normal   Tremors Continue propranolol and Valium (decreased dose) for tremors  Obesity Body mass index is 36.67 kg/m.  Discontinue topamax, phentermine due to their propensity to cause cognitive dysfunction/ CNS side effects   Oral thrush Treated with nystatin for 7 days  Hypernatremia Hyperchloremia Monitor. Discontinue IV fluids.  DVT prophylaxis: Lovenox SQ Code Status: DNR  Family Communication: husband updated at bedside  Disposition Plan:  Goals of care discussed with husband.  He wants to explore hospice options.  Hospice consulted.   Consultants:   Neurology  Palliative care  Procedures: Antimicrobials: None.  Subjective: Remains confused.  Minimal oral intake.  No nausea no vomiting.  Unable to follow  significant commands.  Objective: Vitals:   01/08/20 2120 01/09/20 0537 01/09/20 0644 01/09/20 1406  BP: (!) 166/97 (!) 178/99 128/89 (!) 142/94  Pulse: 74 82 85 80  Resp: 16 16  15   Temp: 98.6 F (37 C) 99 F (37.2 C)  98.6 F (37 C)  TempSrc: Oral Oral  Oral  SpO2: 97% 100% 99% 100%  Weight:      Height:        Intake/Output Summary (Last 24 hours) at 01/09/2020 1640 Last data filed at 01/09/2020 1405 Gross per 24 hour  Intake 420 ml  Output 1100 ml  Net -680 ml   Filed Weights   01/03/20 0238  Weight: 106.2 kg    Examination:  General exam: Appears calm and comfortable  Respiratory system: Clear to auscultation. Respiratory effort normal. Cardiovascular system: S1 & S2 heard, RRR. No JVD, murmurs, rubs, gallops or clicks. No pedal edema. Gastrointestinal system: Abdomen is nondistended, soft and nontender. No organomegaly or masses felt. Normal bowel sounds heard. Central nervous system: Alert and oriented x 1. No focal neurological deficits. Extremities: bilateral pitting edema, tender on palpation.   Skin: No rashes, lesions or ulcers Psychiatry: Judgement and insight appear normal. Mood & affect appropriate.   Data Reviewed: I have personally reviewed following labs and imaging studies  CBC: Recent Labs  Lab 01/03/20 0229 01/06/20 0852 01/07/20 0435  WBC 9.2 10.0 10.1  NEUTROABS  --  7.5  --   HGB 12.7 12.5 12.7  HCT 43.1 40.8 41.3  MCV 99.3 95.3 94.9  PLT 273 289 427   Basic Metabolic Panel: Recent Labs  Lab 01/03/20 0229 01/06/20 0347 01/07/20 0435 01/08/20 0449 01/09/20 0344  NA 140 143 144 144 147*  K 3.8 3.3* 3.4* 3.9 3.3*  CL 106 109 111 113* 114*  CO2 21* 21* 21* 20* 20*  GLUCOSE 90 81 94 94 86  BUN 13 16 18 19 20   CREATININE 1.34* 1.09* 1.03* 0.90 0.89  CALCIUM 9.2 9.0 9.1 9.2 9.4   GFR: Estimated Creatinine Clearance: 72.7 mL/min (by C-G formula based on SCr of 0.89 mg/dL). Liver Function Tests: No results for input(s): AST,  ALT, ALKPHOS, BILITOT, PROT, ALBUMIN in the last 168 hours. No results for input(s): LIPASE, AMYLASE in the last 168 hours. No results for input(s): AMMONIA in the last 168 hours. Coagulation Profile: No results for input(s): INR, PROTIME in the last 168 hours. Cardiac Enzymes: No results for input(s): CKTOTAL, CKMB, CKMBINDEX, TROPONINI in the last 168 hours. BNP (last 3 results) No results for input(s): PROBNP in the last 8760 hours. HbA1C: No results for input(s): HGBA1C in the last 72 hours. CBG: No results for input(s): GLUCAP in the last 168 hours. Lipid Profile: No results for input(s): CHOL, HDL, LDLCALC, TRIG, CHOLHDL, LDLDIRECT in the last 72 hours. Thyroid Function Tests: No results for input(s): TSH, T4TOTAL, FREET4, T3FREE, THYROIDAB in the last 72 hours. Anemia Panel: No results for input(s): VITAMINB12, FOLATE, FERRITIN, TIBC, IRON, RETICCTPCT in the last 72 hours. Sepsis Labs: No results for input(s): PROCALCITON, LATICACIDVEN in the last 168 hours.  Recent Results (from the past 240 hour(s))  SARS CORONAVIRUS 2 (TAT 6-24 HRS) Nasopharyngeal Nasopharyngeal Swab     Status: None   Collection Time: 01/02/20  7:13 PM   Specimen: Nasopharyngeal Swab  Result Value Ref Range Status   SARS Coronavirus 2 NEGATIVE NEGATIVE Final    Comment: (NOTE) SARS-CoV-2 target nucleic acids are NOT DETECTED. The SARS-CoV-2 RNA  is generally detectable in upper and lower respiratory specimens during the acute phase of infection. Negative results do not preclude SARS-CoV-2 infection, do not rule out co-infections with other pathogens, and should not be used as the sole basis for treatment or other patient management decisions. Negative results must be combined with clinical observations, patient history, and epidemiological information. The expected result is Negative. Fact Sheet for Patients: SugarRoll.be Fact Sheet for Healthcare  Providers: https://www.woods-mathews.com/ This test is not yet approved or cleared by the [place] FDA and  has been authorized for detection and/or diagnosis of SARS-CoV-2 by FDA under an Emergency Use Authorization (EUA). This EUA will remain  in effect (meaning this test can be used) for the duration of the COVID-19 declaration under Section 56 4(b)(1) of the Act, 21 U.S.C. section 360bbb-3(b)(1), unless the authorization is terminated or revoked sooner. Performed at [facility], [place] [address], [place], [place] [address]      Radiology Studies: EEG  Result Date: 01/09/2020 Lora Havens, MD     01/09/2020  4:30 PM Patient Name: Tiffany [name] MRN: [id number] Epilepsy Attending: Lora Havens Referring Physician/Provider: Dr. Karena Addison Aroor Date: 01/09/2020 Duration: 2 Patient history: 74 year old female with altered mental status.  EEG to evaluate for seizures. Level of alertness: AEDs during EEG study: Valium Technical aspects: This EEG study was done with scalp electrodes positioned according to the 10-20 International system of electrode placement. Electrical activity was acquired at a sampling rate of 500Hz  and reviewed with a high frequency filter of 70Hz  and a low frequency filter of 1Hz . EEG data were recorded continuously and digitally stored. Description: During awake state, no clear posterior dominant rhythm was seen.  EEG showed continuous generalized 3 to 6 Hz theta-delta slowing as well as intermittent generalized 2 to 3 Hz rhythmic delta slowing. Hyperventilation and photic stimulation were not performed. Abnormality -Continuous slow, generalized -Intermittent rhythmic delta slow, generalized IMPRESSION: This study is suggestive of moderate diffuse encephalopathy, nonspecific to etiology. No seizures or epileptiform discharges were seen throughout the recording. Lora Havens   MR BRAIN W CONTRAST  Result Date: 01/09/2020 CLINICAL DATA:   Encephalopathy EXAM: MRI HEAD WITH CONTRAST TECHNIQUE: Multiplanar, multiecho pulse sequences of the brain and surrounding structures were obtained with intravenous contrast. CONTRAST:  7.23mL GADAVIST GADOBUTROL 1 MMOL/ML IV SOLN COMPARISON:  Noncontrast MRI 01/02/2020 FINDINGS: Brain: There is no abnormal parenchymal or leptomeningeal enhancement. Ventricles are stable in caliber. No mass effect. Vascular: Unremarkable. Skull and upper cervical spine: Unremarkable. Sinuses/Orbits: Unremarkable. Other: None. IMPRESSION: No abnormal enhancement. Electronically Signed   By: Macy Mis M.D.   On: 01/09/2020 13:39   DG FLUORO GUIDE LUMBAR PUNCTURE  Result Date: 01/09/2020 CLINICAL DATA:  Altered mental status.  Requested lumbar puncture. EXAM: DIAGNOSTIC LUMBAR PUNCTURE UNDER FLUOROSCOPIC GUIDANCE FLUOROSCOPY TIME:  Fluoroscopy Time: 54 seconds of low-dose pulsed fluoroscopy Radiation Exposure Index (if provided by the fluoroscopic device): 24.1 mGy Number of Acquired Spot Images: 3 PROCEDURE: I reviewed a lumbar MRI performed 12/08/2019 prior to the procedure. I discussed the risks (including hemorrhage, infection, headache, and nerve damage, among others), benefits, and alternatives to fluoroscopically guided lumbar puncture with the patient's husband by telephone prior to the procedure. Verbal consent for the procedure was given. The patient understood and elected to undergo the procedure as well. Standard time-out procedure was performed. An appropriate site for lumbar puncture was marked on the patient's skin under fluoroscopic guidance. Following sterile skin prep and local anesthetic administration consisting  of 1 percent lidocaine, a 20 gauge spinal needle was advanced without difficulty into the thecal sac at the at the L2-3 level under fluoroscopic guidance. There was initial return of clear colorless CSF, although due to patient's size, the tip of the 9 cm needle would not stay in the thecal sac. I  attempted to reposition this needle without success, and subsequently used a 15 cm 20 gauge spinal needle at the same level. Despite being in the spinal canal and obtaining minimal CSF return, there was insufficient CSF flow to collect the requested samples. The patient's back was prepped and redraped after localizing the L3-4 level. After additional local anesthetic, lumbar puncture was attempted on the left at this level. Despite the needle seeming to be within the spinal canal, there was no significant CSF return. The patient was uncomfortable at this point, and the procedure was terminated. The needle was subsequently removed and the skin cleansed and bandaged. No immediate complications were observed. IMPRESSION: Unsuccessful lumbar puncture, attempted on the left at the L2-3 and L3-4 levels. The needle was seemingly in appropriate position at both levels, and a small amount of CSF return was achieved, insufficient to collect the requested CSF samples. This could be due to dehydration or decreased spinal fluid pressure. The procedure could be re-attempted on a subsequent day after patient hydration. Electronically Signed   By: Richardean Sale M.D.   On: 01/09/2020 13:12     Scheduled Meds: . aspirin EC  81 mg Oral Daily  . Chlorhexidine Gluconate Cloth  6 each Topical Daily  . diazepam  2.5 mg Oral BID  . enalapril  20 mg Oral Daily  . [START ON 01/10/2020] enoxaparin (LOVENOX) injection  40 mg Subcutaneous Q24H  . famotidine  40 mg Oral Daily  . magnesium oxide  400 mg Oral QHS  . mouth rinse  15 mL Mouth Rinse BID  . nystatin  5 mL Mouth/Throat QID  . propranolol  20 mg Oral TID WC  . QUEtiapine  12.5 mg Oral q1800  . rosuvastatin  20 mg Oral Daily   Continuous Infusions:   LOS: 7 days    Time spent: 25 mins    Berle Mull, MD [facility]   If 7PM-7AM, please contact night-coverage

## 2020-01-10 DIAGNOSIS — R410 Disorientation, unspecified: Secondary | ICD-10-CM

## 2020-01-10 LAB — BASIC METABOLIC PANEL
Anion gap: 11 (ref 5–15)
BUN: 20 mg/dL (ref 8–23)
CO2: 22 mmol/L (ref 22–32)
Calcium: 9.5 mg/dL (ref 8.9–10.3)
Chloride: 115 mmol/L — ABNORMAL HIGH (ref 98–111)
Creatinine, Ser: 0.96 mg/dL (ref 0.44–1.00)
GFR calc Af Amer: >60 mL/min (ref >60)
GFR calc non Af Amer: 60 mL/min — ABNORMAL LOW (ref >60)
Glucose, Bld: 102 mg/dL — ABNORMAL HIGH (ref 70–99)
Potassium: 3.5 mmol/L (ref 3.5–5.1)
Sodium: 148 mmol/L — ABNORMAL HIGH (ref 135–145)

## 2020-01-10 LAB — THYROID PEROXIDASE ANTIBODY: Thyroperoxidase Ab SerPl-aCnc: <9 IU/mL (ref 0–34)

## 2020-01-10 MED ORDER — DEXTROSE 5 % IV SOLN: INTRAVENOUS | Status: DC

## 2020-01-10 MED ORDER — MORPHINE SULFATE (PF) 2 MG/ML IV SOLN
2.0000 mg | Freq: Once | INTRAVENOUS | Status: DC
  Filled 2020-01-10: qty 1

## 2020-01-10 MED ORDER — MORPHINE SULFATE (PF) 2 MG/ML IV SOLN
2.0000 mg | INTRAVENOUS | Status: DC | PRN
  Administered 2020-01-10: 2 mg via INTRAVENOUS

## 2020-01-10 NOTE — Progress Notes (Signed)
NEUROLOGY PROGRESS NOTE   Subjective: Patient remains very encephalopathic.  She can name objects and repeat, however she [place] that [place], cannot tell me the month or the year.  I cannot get any information from her as she is highly confused and laughing inappropriately to everything I asked her.  Exam: Vitals:   01/09/20 2215 01/10/20 0635  BP: (!) 161/112 (!) 169/96  Pulse: 81 91  Resp: 16 16  Temp: 98.4 F (36.9 C) 99.2 F (37.3 C)  SpO2: 96% 99%      Physical Exam  Constitutional: Appears well-developed and well-nourished.  Psych: Laughing inappropriately Eyes: No scleral injection HENT: No OP obstrucion Head: Normocephalic.  Cardiovascular: Normal rate and regular rhythm.  Respiratory: Effort normal, non-labored breathing GI: Soft.  No distension. There is no tenderness.  Skin: WDI   Neuro:  Mental Status: Drowsy, not oriented, confused to where she is but able to name objects. dysarthric but not aphasic.  Only able to follow simple commands  cranial Nerves: II:  Visual fields grossly normal,  III,IV, VI: ptosis not present, extra-ocular motions intact bilaterally pupils equal, round, reactive to light and accommodation V,VII: smile symmetric, facial light touch sensation normal bilaterally VIII: hearing normal bilaterally XII: midline tongue extension and extremely dry Motor: Right : Upper extremity   3/5    Left:     Upper extremity   3/5  Lower extremity   0/5     Lower extremity   0/5 Sensory:  light touch intact throughout, bilaterally Deep Tendon Reflexes: 1+ and in upper extremities however no reflexes in lower extremities Plantars: Mute bilaterally Cerebellar: normal finger-to-nose with postural tremor   Medications:  Scheduled: . aspirin EC  81 mg Oral Daily  . Chlorhexidine Gluconate Cloth  6 each Topical Daily  . diazepam  2.5 mg Oral BID  . enalapril  20 mg Oral Daily  . enoxaparin (LOVENOX) injection  40 mg  Subcutaneous Q24H  . famotidine  40 mg Oral Daily  . magnesium oxide  400 mg Oral QHS  . mouth rinse  15 mL Mouth Rinse BID  . nystatin  5 mL Mouth/Throat QID  . propranolol  20 mg Oral TID WC  . QUEtiapine  12.5 mg Oral q1800  . rosuvastatin  20 mg Oral Daily   Continuous:   Pertinent Labs/Diagnostics: -Sodium 148 -113 8 days ago  EEG Result Date: 01/09/2020  IMPRESSION: This study is suggestive of moderate diffuse encephalopathy, nonspecific to etiology. No seizures or epileptiform discharges were seen throughout the recording. Lora Havens   MR BRAIN W CONTRAST  Result Date: 01/09/2020  IMPRESSION: No abnormal enhancement. Electronically Signed   By: Macy Mis M.D.   On: 01/09/2020 13:39   DG FLUORO GUIDE LUMBAR PUNCTURE Result Date: 01/09/2020  IMPRESSION: Unsuccessful lumbar puncture, attempted on the left at the L2-3 and L3-4 levels. The needle was seemingly in appropriate position at both levels, and a small amount of CSF return was achieved, insufficient to collect the requested CSF samples. This could be due to dehydration or decreased spinal fluid pressure. The procedure could be re-attempted on a subsequent day after patient hydration. Electronically Signed   By: Richardean Sale M.D.   On: 01/09/2020 13:12     Etta Quill PA-C [facility] [phone]  Assessment: 74 year old female with history of lung cancer, Charcot-Marie-Tooth disease, subclinical hypothyroidism, severe spinal stenosis at L4-5 with a progressive lower extremity weakness who was admitted for shoulder pain and  worsening of lower extremity weakness.  1. Patient was noted during hospitalization to have waxing and waning mental status, on 1/10 patient was alert and oriented to neurologist, however today she is extremely confused but alert.  She is not oriented at this time.   2. EEG that was obtained showed no epileptiform activity.  3. MRI that was obtained showed no intracranial  abnormalities. 4. Fluoroscopically guided lumbar puncture unfortunately was unsuccessful secondary to either dehydration or decreased spinal fluid pressure.   5. DDx: Withdrawal syndrome (benzodiazepine or EtOH) and hospital delirium (delirium can be present for several days after acute hospitalization, continue to treat as neededis relatively high on the DDx. Rare explanations at this time could be Hashimoto's encephalopathy, paraneoplastic syndrome or limbic encephalitis. Unfortunately CSF was not able to be obtained.  Less likely to be UTI.  Stroke has been ruled out and subclinical status epilepticus has also been ruled out with MRI and EEG, respectively.  Impression: Acute metabolic encephalopathy versus encephalitis versus withdrawal. Also on DDx is delirium with possibly underlying dementia given moderate cerebral atrophy on MRI. Absence of fever would make meningoencephalitis very unlikely.  Recommendations: -Continue quetiapine to 12.5 mg daily -Continue Valium at 2.5 mg daily to avoid withdrawals -Exploring residential hospice. Awaiting placement per primary team --Outpatient neuropsych evaluation in 2 to 4 weeks to evaluate for cognitive decline, pseudodementia may also be a possibility --Also consider trial of Depakote 250 twice daily if no improvement with Seroquel.  Electronically signed: Dr. Kerney Elbe 01/10/2020, 8:34 AM

## 2020-01-10 NOTE — Progress Notes (Signed)
Daily Progress Note   Patient Name: Tiffany [name]       Date: 01/10/2020 DOB: 1946/10/17  Age: 74 y.o. MRN#: [id number] Attending Physician: Shelly Coss, MD Primary Care Physician: Unk Pinto, MD Admit Date: 01/02/2020  Reason for Consultation/Follow-up: Establishing goals of care  Subjective: I saw and examined Tiffany [name] today.  Lying in bed sleeping.  Arouses but confused and goes back to sleep.  No family at bedside.  Lunch tray untouched.  I called and discussed with patient's husband, Merry Proud.  Discussed attempted LP yesterday.  Merry Proud reports that he really does not see pathway for Tiffany [name] to recover and he thinks that we are at point to revisit discussion regarding residential hospice.  He feels that she is more scared and agitated following LP attempt and does not want further attempts to be made.  Discussed awaiting further input from neurology, and Merry Proud reports that he believes that further workup may cause her more distress without adding quality time to her life.  He would like to speak again with liaison from residential hospice.   Length of Stay: 8  Current Medications: Scheduled Meds:  . aspirin EC  81 mg Oral Daily  . Chlorhexidine Gluconate Cloth  6 each Topical Daily  . diazepam  2.5 mg Oral BID  . enalapril  20 mg Oral Daily  . enoxaparin (LOVENOX) injection  40 mg Subcutaneous Q24H  . famotidine  40 mg Oral Daily  . magnesium oxide  400 mg Oral QHS  . mouth rinse  15 mL Mouth Rinse BID  .  morphine injection  2 mg Intravenous Once  . nystatin  5 mL Mouth/Throat QID  . propranolol  20 mg Oral TID WC  . QUEtiapine  12.5 mg Oral q1800  . rosuvastatin  20 mg Oral Daily    Continuous Infusions: . dextrose 50 mL/hr at 01/10/20 1430    PRN Meds:  acetaminophen, morphine injection  Physical Exam         Elderly lady with generalized weakness resting in bed  S1-S2 Poor inspiratory effort Abdomen is not distended Patient has significant bilateral lower extremity edema right worse than left, also has skin breakdown both lower extremities Awake, somewhat confused Vital Signs: BP (!) 155/100   Pulse 87   Temp 98.2 F (36.8 C) (Oral)  Resp 18   Ht 5\' 7"  (1.702 m)   Wt 106.2 kg   SpO2 97%   BMI 36.67 kg/m  SpO2: SpO2: 97 % O2 Device: O2 Device: Room Air O2 Flow Rate: O2 Flow Rate (L/min): 0 L/min  Intake/output summary:   Intake/Output Summary (Last 24 hours) at 01/10/2020 1435 Last data filed at 01/10/2020 1000 Gross per 24 hour  Intake 120 ml  Output 775 ml  Net -655 ml   LBM: Last BM Date: 01/08/20 Baseline Weight: Weight: 106.2 kg Most recent weight: Weight: 106.2 kg       Palliative Assessment/Data:      Patient Active Problem List   Diagnosis Date Noted  . Weakness 01/02/2020  . Tremor 01/02/2020  . Shoulder arthritis 02/10/2019  . Other chronic pain 01/24/2019  . Sensory neuropathy 01/24/2019  . Senile purpura ([place]) 10/18/2018  . Unilateral primary osteoarthritis, left knee 07/21/2018  . Obesity 06/27/2018  . Peripheral neuropathy ([place]) 08/27/2016  . GERD (gastroesophageal reflux disease) 03/27/2016  . Subclinical hyperthyroidism 08/18/2015  . Gastric ulcer due to nonsteroidal antiinflammatory drug (NSAID) therapy   . Lung cancer ([place]) 05/09/2015  . Peripheral edema 05/01/2015  . Abnormal glucose 07/18/2014  . Vitamin D deficiency 07/18/2014  . Medication management 07/18/2014  . Hyperlipidemia, mixed 04/04/2014  . Charcot's Right foot 06/15/2013  . Hypertension     Palliative Care Assessment & Plan   Patient Profile:  74 year old lady who lives at home with her husband.  She has a history of lung cancer, Charcot-Marie-Tooth disease, tremors, subclinical hyperthyroidism, hypertension and  dyslipidemia.  Patient presented with worsening right shoulder pain and worsening generalized weakness.  Patient presented with subacute mental status decline at least since the past several weeks.  Progressive cognitive decline.  Deemed secondary to possible metabolic encephalopathy versus delirium versus possible underlying dementia.  Patient remains admitted to hospital medicine service with worsening weakness, history of neuropathy.  History of severe spinal stenosis L4-L5.  Patient was having recurrent falls at home over the course of the past several weeks.  Required Foley catheter placement due to acute urinary retention.  Had a recent urinary tract infection.  Patient with ongoing cognitive decline.  Mostly bedbound more recently.  In light of all of the above, palliative medicine consultation for goals of care and appropriate disposition options has been requested.  Assessment: Generalized weakness Failure to thrive Poor PO intake Mild generalized pain.   Recommendations/Plan: As per neurology recommendations, MRI brain, EEG and LP ordered.  LP unable to obtain sample and patient's husband does not want further attempts to be made.  Await neurology input, but will also reach out to residential hospice again per his request.  PMT to follow.    Code Status:    Code Status Orders  (From admission, onward)         Start     Ordered   01/05/20 1034  Do not attempt resuscitation (DNR)  Continuous    Question Answer Comment  In the event of cardiac or respiratory ARREST Do not call a "code blue"   In the event of cardiac or respiratory ARREST Do not perform Intubation, CPR, defibrillation or ACLS   In the event of cardiac or respiratory ARREST Use medication by any route, position, wound care, and other measures to relive pain and suffering. May use oxygen, suction and manual treatment of airway obstruction as needed for comfort.      01/05/20 1033        Code  Status History     Date Active Date Inactive Code Status Order ID Comments User Context   01/02/2020 2037 01/05/2020 1033 Full Code [id number]  Orene Desanctis, DO ED   02/10/2019 1847 02/11/2019 1539 Full Code [id number]  Meredith Pel, MD Inpatient   07/13/2015 1230 07/16/2015 1449 Full Code [id number]  Mcarthur Rossetti, MD Inpatient   05/09/2015 2059 05/18/2015 1659 Full Code [id number]  Coolidge Breeze, PA-C Inpatient   Advance Care Planning Activity       Prognosis:  > 2 weeks most likely  Discharge Planning:  Hospice facility likely per family request for reevaluation. PMT to follow.   Care plan was discussed with         Thank you for allowing the Palliative Medicine Team to assist in the care of this patient.   Time In: 1400 Time Out: 1430 Total Time 30 Prolonged Time Billed  no       Greater than 50%  of this time was spent counseling and coordinating care related to the above assessment and plan.  Micheline Rough, MD  Please contact Palliative Medicine Team phone at [phone] for questions and concerns.

## 2020-01-10 NOTE — Progress Notes (Signed)
Physical Therapy Treatment Patient Details Name: Tiffany [name] MRN: [id number] DOB: [birth year] Today's Date: 01/10/2020    History of Present Illness 74 year old female admitted for weakness. She has had AMS x 1 month, charcot-Marie and uses w/c, h/o R RTSA 2/20, subacute distal R clavicle fx, arthritis, lung CA, neuropathy, HTN and multiple orthopedic sxs    PT Comments    Majority of PT session focusing on sitting balance at EOB, with dynamic activity of LUE elbow propping and righting self with assist. Pt tolerated ~10 minutes sitting EOB, limited by fatigue, weakness, and emotional lability this session. Pt's husband Tiffany [name] at bedside, and states he normally lifts her into w/c at home. PT feels pt is not at this level presently, continuing to recommend SNF level of care unless pt and family pursue hospice. PT to continue to follow acutely.    Follow Up Recommendations  SNF     Equipment Recommendations  None recommended by PT    Recommendations for Other Services       Precautions / Restrictions Precautions Precautions: Fall Precaution Comments: multiple falls, sling, R distal clavicle fx Required Braces or Orthoses: Sling(sling appears to be from home. unclear wearing schedule) Restrictions Weight Bearing Restrictions: No Other Position/Activity Restrictions: R subacute distal clavicle fx - pt with RUE in lap for sitting EOB    Mobility  Bed Mobility Overal bed mobility: Needs Assistance Bed Mobility: Supine to Sit;Sit to Supine Rolling: Max assist   Supine to sit: Max assist;+2 for physical assistance;+2 for safety/equipment;HOB elevated Sit to supine: +2 for physical assistance;Max assist;+2 for safety/equipment   General bed mobility comments: max assist for trunk and LE management, scooting to and from EOB. Pt resistant to mobility at times, especially with movement of LEs.  Transfers                 General transfer comment: unable  Ambulation/Gait              General Gait Details: non-amb at baseline   Stairs             Wheelchair Mobility    Modified Rankin (Stroke Patients Only)       Balance Overall balance assessment: Needs assistance Sitting-balance support: Feet supported;Single extremity supported Sitting balance-Leahy Scale: Fair Sitting balance - Comments: 10 minutes EOB activity, pt with LUE elbow propping when fatigued requiring mod truncal assist to return to midline. Postural control: Posterior lean;Left lateral lean     Standing balance comment: unable                            Cognition Arousal/Alertness: Awake/alert Behavior During Therapy: WFL for tasks assessed/performed Overall Cognitive Status: History of cognitive impairments - at baseline Area of Impairment: Orientation;Following commands;Problem solving;Attention;Memory                 Orientation Level: Disoriented to;Time;Place;Situation Current Attention Level: Sustained Memory: Decreased short-term memory Following Commands: Follows one step commands with increased time;Follows one step commands inconsistently     Problem Solving: Difficulty sequencing;Requires verbal cues;Requires tactile cues General Comments: pt emotionally labile today, oriented to self and husband. Pt states she has been married to husband Tiffany [name] x100 years (has been 25)      Exercises Other Exercises Other Exercises: positioning with pillow - heels floating, under R trunk and gluteal region for pressure relief    General Comments        Pertinent Vitals/Pain  Pain Assessment: Faces Faces Pain Scale: Hurts even more Pain Location: generalized, especially with LE movements Pain Descriptors / Indicators: Grimacing;Guarding;Moaning Pain Intervention(s): Limited activity within patient's tolerance;Monitored during session;Repositioned    Home Living                      Prior Function            PT Goals (current goals  can now be found in the care plan section) Acute Rehab PT Goals Patient Stated Goal: home PT Goal Formulation: Patient unable to participate in goal setting Time For Goal Achievement: 01/17/20 Potential to Achieve Goals: Fair Progress towards PT goals: Not progressing toward goals - comment(continued max +2 for bed mobility and EOB activity)    Frequency    Min 2X/week      PT Plan Current plan remains appropriate    Co-evaluation              AM-PAC PT "6 Clicks" Mobility   Outcome Measure  Help needed turning from your back to your side while in a flat bed without using bedrails?: Total Help needed moving from lying on your back to sitting on the side of a flat bed without using bedrails?: Total Help needed moving to and from a bed to a chair (including a wheelchair)?: Total Help needed standing up from a chair using your arms (e.g., wheelchair or bedside chair)?: Total Help needed to walk in hospital room?: Total Help needed climbing 3-5 steps with a railing? : Total 6 Click Score: 6    End of Session   Activity Tolerance: Patient limited by fatigue;Other (comment)(emotional lability) Patient left: in bed;with bed alarm set;with call bell/phone within reach Nurse Communication: Mobility status PT Visit Diagnosis: Other abnormalities of gait and mobility (R26.89);Muscle weakness (generalized) (M62.81);Repeated falls (R29.6)     Time: 1030-1050 PT Time Calculation (min) (ACUTE ONLY): 20 min  Charges:  $Neuromuscular Re-education: 8-22 mins                     Kazden Largo E, PT Acute Rehabilitation Services Pager [phone]  Office [phone]    Areej Tayler D Stellar Gensel 01/10/2020, 11:05 AM

## 2020-01-10 NOTE — Progress Notes (Signed)
PROGRESS NOTE    Tiffany [name]  OAC:[id number] DOB: 1946-12-09 DOA: 01/02/2020 PCP: Unk Pinto, MD   Brief Narrative:  Patient is a 74 year old female with history of lung cancer, Charcot Marie tooth, tremors, subclinical hypothyroidism, hypertension, hyperlipidemia who presented with worsening right shoulder pain, worsening weakness, confusion.  She was having progressive cognitive decline.  Neurology is following.  Since patient's mental status did not improve spontaneously, she underwent extensive work-up.  Palliative care also consulted.  There is also discussion for transitioning her care to residential hospice.  Assessment & Plan:   Principal Problem:   Weakness Active Problems:   Hypertension   Charcot's Right foot   Subclinical hyperthyroidism   Obesity   Sensory neuropathy   Shoulder arthritis   Tremor   Acute metabolic encephalopathy: Still appears very encephalopathic.  Confused.  Communicates but completely disoriented. Neurology following.  Had extensive work-up.  CT/MRI of brain did not show any acute intracranial abnormalities but shows moderate atrophy.  Phentermine, Topamax discontinued.  This could be progression of her dementia. Diazepam tapered to 2.5 mg twice daily.  EEG did not show any seizures or epileptiform activities, showed diffuse encephalopathy. Attempted LP was was unsuccessful. Waiting to hear from neurology about prognosis so that we can discuss with husband about transitioning her care to hospice.  AKI: Resolved.  Hypertension: Hypertensive this morning.  Continue current medicines.  Continue enalapril, propanolol  History of subclinical hypothyroidism: TSH has been low since she 89.  Free T4 normal.  Right shoulder pain: X-ray did not show any acute fracture dislocation.  Tremors: On propranolol, Valium  Oral thrush: Treated with nystatin.  Debility/deconditioning: Seen by physical therapy and recommended skilled nursing  facility  Hypernatremia: Sodium of 148 today.  We will continue to monitor.  Will start on gentle D5  Goals of care: Patient remains encephalopathic.  No improvement in the mental status.  Work-up is largely negative for acute findings.  This could be from the progression of her dementia.  She has very poor oral intake ,poor quality of life.  Hospice is appropriate for her.  Palliative care was also following and they recommend residential hospice.  Waiting for neurology recommendation.           DVT prophylaxis:Lovenox Code Status: DNR Family Communication: None present at the bedside Disposition Plan: Likely residential hospice.   Consultants: Neurology  Procedures: MRI, EEG  Antimicrobials:  Anti-infectives (From admission, onward)   None      Subjective: Patient seen and examined at the bedside .  Hemodynamically stable.  Confused, disoriented.  She was laughing without any stimulus.  Not in distress  Objective: Vitals:   01/09/20 0644 01/09/20 1406 01/09/20 2215 01/10/20 0635  BP: 128/89 (!) 142/94 (!) 161/112 (!) 169/96  Pulse: 85 80 81 91  Resp:  15 16 16   Temp:  98.6 F (37 C) 98.4 F (36.9 C) 99.2 F (37.3 C)  TempSrc:  Oral Oral Oral  SpO2: 99% 100% 96% 99%  Weight:      Height:        Intake/Output Summary (Last 24 hours) at 01/10/2020 1309 Last data filed at 01/10/2020 1000 Gross per 24 hour  Intake 120 ml  Output 875 ml  Net -755 ml   Filed Weights   01/03/20 0238  Weight: 106.2 kg    Examination:  General exam: Debilitated, deconditioned, chronically ill looking, generalized weakness HEENT:Oral mucosa moist, Ear/Nose normal on gross exam Respiratory system: Bilateral diminished air sounds on the bases,  no wheezes or crackles  Cardiovascular system: S1 & S2 heard, RRR. No JVD, murmurs, rubs, gallops or clicks.Trace pedal edema. Gastrointestinal system: Abdomen is nondistended, soft and nontender. No organomegaly or masses felt. Normal bowel  sounds heard. Central nervous system: Awake, not alert or oriented  extremities: Trace bilateral lower extremity edema, no clubbing  Skin: Skin breakdowns in bilateral lower extremities  Psychiatry: Judgement and insight appear impaired GU: Foley catheter   Data Reviewed: I have personally reviewed following labs and imaging studies  CBC: Recent Labs  Lab 01/06/20 0852 01/07/20 0435  WBC 10.0 10.1  NEUTROABS 7.5  --   HGB 12.5 12.7  HCT 40.8 41.3  MCV 95.3 94.9  PLT 289 947   Basic Metabolic Panel: Recent Labs  Lab 01/06/20 0347 01/07/20 0435 01/08/20 0449 01/09/20 0344 01/10/20 0759  NA 143 144 144 147* 148*  K 3.3* 3.4* 3.9 3.3* 3.5  CL 109 111 113* 114* 115*  CO2 21* 21* 20* 20* 22  GLUCOSE 81 94 94 86 102*  BUN 16 18 19 20 20   CREATININE 1.09* 1.03* 0.90 0.89 0.96  CALCIUM 9.0 9.1 9.2 9.4 9.5   GFR: Estimated Creatinine Clearance: 67.4 mL/min (by C-G formula based on SCr of 0.96 mg/dL). Liver Function Tests: No results for input(s): AST, ALT, ALKPHOS, BILITOT, PROT, ALBUMIN in the last 168 hours. No results for input(s): LIPASE, AMYLASE in the last 168 hours. No results for input(s): AMMONIA in the last 168 hours. Coagulation Profile: No results for input(s): INR, PROTIME in the last 168 hours. Cardiac Enzymes: No results for input(s): CKTOTAL, CKMB, CKMBINDEX, TROPONINI in the last 168 hours. BNP (last 3 results) No results for input(s): PROBNP in the last 8760 hours. HbA1C: No results for input(s): HGBA1C in the last 72 hours. CBG: No results for input(s): GLUCAP in the last 168 hours. Lipid Profile: No results for input(s): CHOL, HDL, LDLCALC, TRIG, CHOLHDL, LDLDIRECT in the last 72 hours. Thyroid Function Tests: No results for input(s): TSH, T4TOTAL, FREET4, T3FREE, THYROIDAB in the last 72 hours. Anemia Panel: No results for input(s): VITAMINB12, FOLATE, FERRITIN, TIBC, IRON, RETICCTPCT in the last 72 hours. Sepsis Labs: No results for input(s):  PROCALCITON, LATICACIDVEN in the last 168 hours.  Recent Results (from the past 240 hour(s))  SARS CORONAVIRUS 2 (TAT 6-24 HRS) Nasopharyngeal Nasopharyngeal Swab     Status: None   Collection Time: 01/02/20  7:13 PM   Specimen: Nasopharyngeal Swab  Result Value Ref Range Status   SARS Coronavirus 2 NEGATIVE NEGATIVE Final    Comment: (NOTE) SARS-CoV-2 target nucleic acids are NOT DETECTED. The SARS-CoV-2 RNA is generally detectable in upper and lower respiratory specimens during the acute phase of infection. Negative results do not preclude SARS-CoV-2 infection, do not rule out co-infections with other pathogens, and should not be used as the sole basis for treatment or other patient management decisions. Negative results must be combined with clinical observations, patient history, and epidemiological information. The expected result is Negative. Fact Sheet for Patients: SugarRoll.be Fact Sheet for Healthcare Providers: https://www.woods-mathews.com/ This test is not yet approved or cleared by the [place] FDA and  has been authorized for detection and/or diagnosis of SARS-CoV-2 by FDA under an Emergency Use Authorization (EUA). This EUA will remain  in effect (meaning this test can be used) for the duration of the COVID-19 declaration under Section 56 4(b)(1) of the Act, 21 U.S.C. section 360bbb-3(b)(1), unless the authorization is terminated or revoked sooner. Performed at [facility]  Lab, [address] [place], [place] [address]          Radiology Studies: EEG  Result Date: 01/09/2020 Lora Havens, MD     01/09/2020  4:30 PM Patient Name: Tiffany [name] MRN: [id number] Epilepsy Attending: Lora Havens Referring Physician/Provider: Dr. Karena Addison Aroor Date: 01/09/2020 Duration: 2 Patient history: 74 year old female with altered mental status.  EEG to evaluate for seizures. Level of alertness: AEDs during EEG study:  [place] Technical aspects: This EEG study was done with scalp electrodes positioned according to the 10-20 International system of electrode placement. Electrical activity was acquired at a sampling rate of 500Hz  and reviewed with a high frequency filter of 70Hz  and a low frequency filter of 1Hz . EEG data were recorded continuously and digitally stored. Description: During awake state, no clear posterior dominant rhythm was seen.  EEG showed continuous generalized 3 to 6 Hz theta-delta slowing as well as intermittent generalized 2 to 3 Hz rhythmic delta slowing. Hyperventilation and photic stimulation were not performed. Abnormality -Continuous slow, generalized -Intermittent rhythmic delta slow, generalized IMPRESSION: This study is suggestive of moderate diffuse encephalopathy, nonspecific to etiology. No seizures or epileptiform discharges were seen throughout the recording. Lora Havens   MR BRAIN W CONTRAST  Result Date: 01/09/2020 CLINICAL DATA:  Encephalopathy EXAM: MRI HEAD WITH CONTRAST TECHNIQUE: Multiplanar, multiecho pulse sequences of the brain and surrounding structures were obtained with intravenous contrast. CONTRAST:  7.40mL GADAVIST GADOBUTROL 1 MMOL/ML IV SOLN COMPARISON:  Noncontrast MRI 01/02/2020 FINDINGS: Brain: There is no abnormal parenchymal or leptomeningeal enhancement. Ventricles are stable in caliber. No mass effect. Vascular: Unremarkable. Skull and upper cervical spine: Unremarkable. Sinuses/Orbits: Unremarkable. Other: None. IMPRESSION: No abnormal enhancement. Electronically Signed   By: Macy Mis M.D.   On: 01/09/2020 13:39   DG FLUORO GUIDE LUMBAR PUNCTURE  Result Date: 01/09/2020 CLINICAL DATA:  Altered mental status.  Requested lumbar puncture. EXAM: DIAGNOSTIC LUMBAR PUNCTURE UNDER FLUOROSCOPIC GUIDANCE FLUOROSCOPY TIME:  Fluoroscopy Time: 54 seconds of low-dose pulsed fluoroscopy Radiation Exposure Index (if provided by the fluoroscopic device): 24.1 mGy Number  of Acquired Spot Images: 3 PROCEDURE: I reviewed a lumbar MRI performed 12/08/2019 prior to the procedure. I discussed the risks (including hemorrhage, infection, headache, and nerve damage, among others), benefits, and alternatives to fluoroscopically guided lumbar puncture with the patient's husband by telephone prior to the procedure. Verbal consent for the procedure was given. The patient understood and elected to undergo the procedure as well. Standard time-out procedure was performed. An appropriate site for lumbar puncture was marked on the patient's skin under fluoroscopic guidance. Following sterile skin prep and local anesthetic administration consisting of 1 percent lidocaine, a 20 gauge spinal needle was advanced without difficulty into the thecal sac at the at the L2-3 level under fluoroscopic guidance. There was initial return of clear colorless CSF, although due to patient's size, the tip of the 9 cm needle would not stay in the thecal sac. I attempted to reposition this needle without success, and subsequently used a 15 cm 20 gauge spinal needle at the same level. Despite being in the spinal canal and obtaining minimal CSF return, there was insufficient CSF flow to collect the requested samples. The patient's back was prepped and redraped after localizing the L3-4 level. After additional local anesthetic, lumbar puncture was attempted on the left at this level. Despite the needle seeming to be within the spinal canal, there was no significant CSF return. The patient was uncomfortable at this point, and the  procedure was terminated. The needle was subsequently removed and the skin cleansed and bandaged. No immediate complications were observed. IMPRESSION: Unsuccessful lumbar puncture, attempted on the left at the L2-3 and L3-4 levels. The needle was seemingly in appropriate position at both levels, and a small amount of CSF return was achieved, insufficient to collect the requested CSF samples. This  could be due to dehydration or decreased spinal fluid pressure. The procedure could be re-attempted on a subsequent day after patient hydration. Electronically Signed   By: Richardean Sale M.D.   On: 01/09/2020 13:12        Scheduled Meds: . aspirin EC  81 mg Oral Daily  . Chlorhexidine Gluconate Cloth  6 each Topical Daily  . diazepam  2.5 mg Oral BID  . enalapril  20 mg Oral Daily  . enoxaparin (LOVENOX) injection  40 mg Subcutaneous Q24H  . famotidine  40 mg Oral Daily  . magnesium oxide  400 mg Oral QHS  . mouth rinse  15 mL Mouth Rinse BID  .  morphine injection  2 mg Intravenous Once  . nystatin  5 mL Mouth/Throat QID  . propranolol  20 mg Oral TID WC  . QUEtiapine  12.5 mg Oral q1800  . rosuvastatin  20 mg Oral Daily   Continuous Infusions:   LOS: 8 days    Time spent: 35 mins.More than 50% of that time was spent in counseling and/or coordination of care.      Shelly Coss, MD [facility] Pager [phone]  If 7PM-7AM, please contact night-coverage [email or web address] Password [facility] 01/10/2020, 1:09 PM

## 2020-01-11 LAB — BASIC METABOLIC PANEL
Anion gap: 11 (ref 5–15)
BUN: 19 mg/dL (ref 8–23)
CO2: 21 mmol/L — ABNORMAL LOW (ref 22–32)
Calcium: 9.3 mg/dL (ref 8.9–10.3)
Chloride: 114 mmol/L — ABNORMAL HIGH (ref 98–111)
Creatinine, Ser: 0.89 mg/dL (ref 0.44–1.00)
GFR calc Af Amer: >60 mL/min (ref >60)
GFR calc non Af Amer: >60 mL/min (ref >60)
Glucose, Bld: 115 mg/dL — ABNORMAL HIGH (ref 70–99)
Potassium: 3.2 mmol/L — ABNORMAL LOW (ref 3.5–5.1)
Sodium: 146 mmol/L — ABNORMAL HIGH (ref 135–145)

## 2020-01-11 NOTE — Progress Notes (Signed)
Daily Progress Note   Patient Name: Tiffany [name]       Date: 01/11/2020 DOB: Jun 02, 1946  Age: 74 y.o. MRN#: [id number] Attending Physician: Shelly Coss, MD Primary Care Physician: Unk Pinto, MD Admit Date: 01/02/2020  Reason for Consultation/Follow-up: Establishing goals of care  Subjective: I saw and examined Tiffany [name] today.  Lying in bed sleeping.  Arouses but confused and goes back to sleep.  No family at bedside.  Lunch tray untouched.  Discussed with liaison from [employer].  Plan for transition to residential hospice.  Length of Stay: 9  Current Medications: Scheduled Meds:  . aspirin EC  81 mg Oral Daily  . Chlorhexidine Gluconate Cloth  6 each Topical Daily  . diazepam  2.5 mg Oral BID  . enalapril  20 mg Oral Daily  . enoxaparin (LOVENOX) injection  40 mg Subcutaneous Q24H  . famotidine  40 mg Oral Daily  . magnesium oxide  400 mg Oral QHS  . mouth rinse  15 mL Mouth Rinse BID  .  morphine injection  2 mg Intravenous Once  . nystatin  5 mL Mouth/Throat QID  . propranolol  20 mg Oral TID WC  . QUEtiapine  12.5 mg Oral q1800  . rosuvastatin  20 mg Oral Daily    Continuous Infusions: . dextrose 50 mL/hr at 01/11/20 0200    PRN Meds: acetaminophen, morphine injection  Physical Exam         Elderly lady with generalized weakness resting in bed  S1-S2 Poor inspiratory effort Abdomen is not distended Patient has significant bilateral lower extremity edema right worse than left, also has skin breakdown both lower extremities Awake, somewhat confused Vital Signs: BP 126/86   Pulse 98   Temp (!) 97.4 F (36.3 C) (Oral)   Resp 18   Ht 5\' 7"  (1.702 m)   Wt 106.2 kg   SpO2 100%   BMI 36.67 kg/m  SpO2: SpO2: 100 % O2 Device: O2 Device: Room Air  O2 Flow Rate: O2 Flow Rate (L/min): 0 L/min  Intake/output summary:   Intake/Output Summary (Last 24 hours) at 01/11/2020 1320 Last data filed at 01/11/2020 0550 Gross per 24 hour  Intake 934.47 ml  Output 900 ml  Net 34.47 ml   LBM: Last BM Date: 01/08/20 Baseline Weight: Weight: 106.2 kg Most recent  weight: Weight: 106.2 kg       Palliative Assessment/Data:      Patient Active Problem List   Diagnosis Date Noted  . Weakness 01/02/2020  . Tremor 01/02/2020  . Shoulder arthritis 02/10/2019  . Other chronic pain 01/24/2019  . Sensory neuropathy 01/24/2019  . Senile purpura ([place]) 10/18/2018  . Unilateral primary osteoarthritis, left knee 07/21/2018  . Obesity 06/27/2018  . Peripheral neuropathy ([place]) 08/27/2016  . GERD (gastroesophageal reflux disease) 03/27/2016  . Subclinical hyperthyroidism 08/18/2015  . Gastric ulcer due to nonsteroidal antiinflammatory drug (NSAID) therapy   . Lung cancer ([place]) 05/09/2015  . Peripheral edema 05/01/2015  . Abnormal glucose 07/18/2014  . Vitamin D deficiency 07/18/2014  . Medication management 07/18/2014  . Hyperlipidemia, mixed 04/04/2014  . Charcot's Right foot 06/15/2013  . Hypertension     Palliative Care Assessment & Plan   Patient Profile:  74 year old lady who lives at home with her husband.  She has a history of lung cancer, Charcot-Marie-Tooth disease, tremors, subclinical hyperthyroidism, hypertension and dyslipidemia.  Patient presented with worsening right shoulder pain and worsening generalized weakness.  Patient presented with subacute mental status decline at least since the past several weeks.  Progressive cognitive decline.  Deemed secondary to possible metabolic encephalopathy versus delirium versus possible underlying dementia.  Patient remains admitted to hospital medicine service with worsening weakness, history of neuropathy.  History of severe spinal stenosis L4-L5.  Patient was having recurrent falls at  home over the course of the past several weeks.  Required Foley catheter placement due to acute urinary retention.  Had a recent urinary tract infection.  Patient with ongoing cognitive decline.  Mostly bedbound more recently.  In light of all of the above, palliative medicine consultation for goals of care and appropriate disposition options has been requested.  Assessment: Generalized weakness Failure to thrive Poor PO intake Mild generalized pain.   Recommendations/Plan: Plan to transition to [facility] today. Appears stable for transport. Durable DNR placed on chart.   Code Status:    Code Status Orders  (From admission, onward)         Start     Ordered   01/05/20 1034  Do not attempt resuscitation (DNR)  Continuous    Question Answer Comment  In the event of cardiac or respiratory ARREST Do not call a "code blue"   In the event of cardiac or respiratory ARREST Do not perform Intubation, CPR, defibrillation or ACLS   In the event of cardiac or respiratory ARREST Use medication by any route, position, wound care, and other measures to relive pain and suffering. May use oxygen, suction and manual treatment of airway obstruction as needed for comfort.      01/05/20 1033        Code Status History    Date Active Date Inactive Code Status Order ID Comments User Context   01/02/2020 2037 01/05/2020 1033 Full Code [id number]  Orene Desanctis, DO ED   02/10/2019 1847 02/11/2019 1539 Full Code [id number]  Meredith Pel, MD Inpatient   07/13/2015 1230 07/16/2015 1449 Full Code [id number]  Mcarthur Rossetti, MD Inpatient   05/09/2015 2059 05/18/2015 1659 Full Code [id number]  Coolidge Breeze, PA-C Inpatient   Advance Care Planning Activity       Prognosis:  > 2 weeks most likely  Discharge Planning:  Hospice facility  Care plan was discussed with Care management, Dr. Tawanna Solo        Thank you for allowing the Palliative  Medicine Team to assist in the care of this  patient.   Time In: 1400 Time Out: 1420 Total Time 20 Prolonged Time Billed  no       Greater than 50%  of this time was spent counseling and coordinating care related to the above assessment and plan.  Micheline Rough, MD  Please contact Palliative Medicine Team phone at [phone] for questions and concerns.

## 2020-01-11 NOTE — Progress Notes (Signed)
Occupational Therapy Treatment Patient Details Name: Tiffany [name] MRN: [id number] DOB: [birth year] Today's Date: 01/11/2020    History of present illness 74 year old female admitted for weakness. She has had AMS x 1 month, charcot-Marie and uses w/c, h/o R RTSA 2/20, subacute distal R clavicle fx, arthritis, lung CA, neuropathy, HTN and multiple orthopedic sxs   OT comments  Pts husband stated plan may now be residential Hospice.     Follow Up Recommendations  SNF    Equipment Recommendations  None recommended by OT    Recommendations for Other Services      Precautions / Restrictions Precautions Precautions: Fall Precaution Comments: multiple falls, sling, R distal clavicle fx Required Braces or Orthoses: Sling(sling appears to be from home. unclear wearing schedule) Restrictions Weight Bearing Restrictions: No       Mobility Bed Mobility Overal bed mobility: Needs Assistance(total A for repositioning in bed)                Transfers                          ADL either performed or assessed with clinical judgement   ADL       Grooming: Oral care;Bed level;Total assistance(pt did open mouth for OT but did not A with holding tooth sponge.)                                 General ADL Comments: Educated husband on positioning RUE in bed for comfort. Husband verbalized understanding.     Vision Patient Visual Report: No change from baseline     Perception     Praxis      Cognition Arousal/Alertness: Lethargic Behavior During Therapy: Flat affect Overall Cognitive Status: Impaired/Different from baseline                                                     Pertinent Vitals/ Pain       Pain Assessment: Faces Pain Score: 4  Pain Location: with repositioning         Frequency  Min 2X/week        Progress Toward Goals  OT Goals(current goals can now be found in the care plan section)  Progress  towards OT goals: Not progressing toward goals - comment(pt lethargic this day.  Plan at this time my be residential Hospice)     Plan Discharge plan remains appropriate       AM-PAC OT "6 Clicks" Daily Activity     Outcome Measure   Help from another person eating meals?: A Lot Help from another person taking care of personal grooming?: Total Help from another person toileting, which includes using toliet, bedpan, or urinal?: Total Help from another person bathing (including washing, rinsing, drying)?: Total Help from another person to put on and taking off regular upper body clothing?: Total Help from another person to put on and taking off regular lower body clothing?: Total 6 Click Score: 7    End of Session    OT Visit Diagnosis: Muscle weakness (generalized) (M62.81);History of falling (Z91.81);Repeated falls (R29.6);Unsteadiness on feet (R26.81);Cognitive communication deficit (R41.841)   Activity Tolerance Patient limited by fatigue   Patient Left in bed;with call bell/phone within reach;with  bed alarm set;with family/visitor present   Nurse Communication Other (comment)(need formouth care)        Time: 1791-5056 OT Time Calculation (min): 10 min  Charges: OT General Charges $OT Visit: 1 Visit OT Treatments $Self Care/Home Management : 8-22 mins  Kari Baars, [place] Pager717 [phone] Office- [phone], Edwena Felty D 01/11/2020, 12:33 PM

## 2020-01-11 NOTE — Discharge Summary (Signed)
Physician Discharge Summary  SARH KIRSCHENBAUM VXB:[id number] DOB: Aug 18, 1946 DOA: 01/02/2020  PCP: Unk Pinto, MD  Admit date: 01/02/2020 Discharge date: 01/11/2020  Admitted From: Home Disposition:  Residential Hospice Discharge Condition:Stable CODE STATUS:FULL, DNR, Comfort Care Diet recommendation: Regular  Brief/Interim Summary:  Patient is a 74 year old female with history of lung cancer, Charcot Marie tooth, tremors, subclinical hypothyroidism, hypertension, hyperlipidemia who presented with worsening right shoulder pain, worsening weakness, confusion.  She was having progressive cognitive decline.  Neurology was following.  Since patient's mental status did not improve spontaneously, she underwent extensive work-up.  Palliative care also  consulted.  Her mental status did not improve and she remains confused. Husband decided to transition her care to hospice.  She is  being transferred to residence hospice today.  Following problems were addressed during her hospitalization:  Acute metabolic encephalopathy: Still appears very encephalopathic.  Confused.  Communicates but completely disoriented. Neurology was following.  Had extensive work-up.  CT/MRI of brain did not show any acute intracranial abnormalities but shows moderate atrophy EEG did not show any seizures or epileptiform activities, showed diffuse encephalopathy. Attempted LP was was unsuccessful. Neurology agreed with transitioning her care to hospice.  AKI: Resolved.  Hypertension: Normotensive this morning.  On enalapril, propanolol  Right shoulder pain: X-ray did not show any acute fracture dislocation.  Tremors: She was on On propranolol, Valium  Oral thrush: Treated with nystatin.  Debility/deconditioning: Seen by physical therapy and recommended skilled nursing facility  Goals of care: Patient remains encephalopathic.  No improvement in the mental status.  Work-up is largely negative for acute  findings.  This could be from the progression of her dementia.  She has very poor oral intake ,poor quality of life.  Hospice is appropriate for her.  Palliative care was also following and they recommend residential hospice.  Discharge Diagnoses:  Principal Problem:   Weakness Active Problems:   Hypertension   Charcot's Right foot   Subclinical hyperthyroidism   Obesity   Sensory neuropathy   Shoulder arthritis   Tremor    Discharge Instructions   Allergies as of 01/11/2020      Reactions   Other Other (See Comments)   Stool softeners caused stomach bloating, nausea, vomiting bleeding ulcer and blood transfusion      Medication List    STOP taking these medications   aspirin EC 81 MG tablet   chlorpheniramine 4 MG tablet Commonly known as: CHLOR-TRIMETON   ciprofloxacin 500 MG tablet Commonly known as: Cipro   diazepam 5 MG tablet Commonly known as: Valium   enalapril 20 MG tablet Commonly known as: VASOTEC   famotidine 40 MG tablet Commonly known as: PEPCID   fluticasone 50 MCG/ACT nasal spray Commonly known as: FLONASE   furosemide 40 MG tablet Commonly known as: LASIX   Magnesium 250 MG Tabs   meloxicam 15 MG tablet Commonly known as: MOBIC   methylPREDNISolone 4 MG Tbpk tablet Commonly known as: MEDROL DOSEPAK   MULTIVITAMIN PO   nystatin powder Commonly known as: nystatin   phentermine 37.5 MG tablet Commonly known as: ADIPEX-P   propranolol 40 MG tablet Commonly known as: INDERAL   rosuvastatin 20 MG tablet Commonly known as: Crestor   topiramate 50 MG tablet Commonly known as: Topamax   Vitamin D3 125 MCG (5000 UT) Caps       Allergies  Allergen Reactions  . Other Other (See Comments)    Stool softeners caused stomach bloating, nausea, vomiting bleeding ulcer and blood transfusion  Consultations:  Neurology, palliative care   Procedures/Studies: EEG  Result Date: 01/09/2020 Lora Havens, MD     01/09/2020   4:30 PM Patient Name: Tiffany [name] MRN: [id number] Epilepsy Attending: Lora Havens Referring Physician/Provider: Dr. Karena Addison Aroor Date: 01/09/2020 Duration: 2 Patient history: 74 year old female with altered mental status.  EEG to evaluate for seizures. Level of alertness: AEDs during EEG study: Valium Technical aspects: This EEG study was done with scalp electrodes positioned according to the 10-20 International system of electrode placement. Electrical activity was acquired at a sampling rate of 500Hz  and reviewed with a high frequency filter of 70Hz  and a low frequency filter of 1Hz . EEG data were recorded continuously and digitally stored. Description: During awake state, no clear posterior dominant rhythm was seen.  EEG showed continuous generalized 3 to 6 Hz theta-delta slowing as well as intermittent generalized 2 to 3 Hz rhythmic delta slowing. Hyperventilation and photic stimulation were not performed. Abnormality -Continuous slow, generalized -Intermittent rhythmic delta slow, generalized IMPRESSION: This study is suggestive of moderate diffuse encephalopathy, nonspecific to etiology. No seizures or epileptiform discharges were seen throughout the recording. Lora Havens   DG Chest 2 View  Result Date: 01/02/2020 CLINICAL DATA:  Right shoulder pain EXAM: CHEST - 2 VIEW COMPARISON:  03/27/2017 FINDINGS: Surgical clips left hilum from prior lobectomy.  No recurrent mass. No acute infiltrate or effusion.  Negative for heart failure Chronic left rib fractures. Subacute healing fracture distal right clavicle. Right shoulder replacement. IMPRESSION: No acute cardiopulmonary abnormality Healing fracture distal right clavicle. Electronically Signed   By: Franchot Gallo M.D.   On: 01/02/2020 16:07   DG Shoulder Right  Result Date: 01/02/2020 CLINICAL DATA:  Right shoulder pain. EXAM: RIGHT SHOULDER - 2+ VIEW COMPARISON:  02/25/2019 FINDINGS: Right shoulder replacement.  Normal alignment. Fracture  of the distal third of the clavicle. This has sclerotic margins suggesting a subacute fracture with partial healing. This was not present previously. IMPRESSION: Right shoulder replacement Subacute healing fracture right distal clavicle. Electronically Signed   By: Franchot Gallo M.D.   On: 01/02/2020 16:05   CT Head Wo Contrast  Result Date: 01/02/2020 CLINICAL DATA:  Encephalopathy EXAM: CT HEAD WITHOUT CONTRAST TECHNIQUE: Contiguous axial images were obtained from the base of the skull through the vertex without intravenous contrast. COMPARISON:  CT head 12/08/2019 FINDINGS: Brain: Cerebral atrophy most prominent in the frontal lobes, stable. Negative for hydrocephalus. Chronic white matter ischemic changes stable. Negative for acute infarct, hemorrhage, mass. Vascular: Negative for hyperdense vessel Skull: Negative Sinuses/Orbits: Mucosal edema sphenoid sinus and right mastoid sinus. Remaining sinuses clear. No orbital lesion. Other: None IMPRESSION: Atrophy and chronic white matter ischemia. No acute abnormality and no change from the prior study. Electronically Signed   By: Franchot Gallo M.D.   On: 01/02/2020 16:27   MR BRAIN WO CONTRAST  Result Date: 01/02/2020 CLINICAL DATA:  Altered mental status. EXAM: MRI HEAD WITHOUT CONTRAST TECHNIQUE: Multiplanar, multiecho pulse sequences of the brain and surrounding structures were obtained without intravenous contrast. COMPARISON:  Head CT 01/02/2020 FINDINGS: The study is intermittently moderately motion degraded. Brain: There is no evidence of acute infarct, intracranial hemorrhage, mass, midline shift, or extra-axial fluid collection. T2 hyperintensities in the cerebral white matter bilaterally are nonspecific but compatible with mild-to-moderate chronic small vessel ischemic disease. There is moderate cerebral atrophy. Vascular: Major intracranial vascular flow voids are preserved. Skull and upper cervical spine: No suspicious marrow lesion. Previous  cervical spine fusion. Sinuses/Orbits: Unremarkable orbits.  Trace right sphenoid sinus fluid. Moderately large right mastoid effusion. Other: None. IMPRESSION: 1. No acute intracranial abnormality. 2. Mild-to-moderate chronic small vessel ischemic disease and moderate cerebral atrophy. Electronically Signed   By: Logan Bores M.D.   On: 01/02/2020 18:40   MR BRAIN W CONTRAST  Result Date: 01/09/2020 CLINICAL DATA:  Encephalopathy EXAM: MRI HEAD WITH CONTRAST TECHNIQUE: Multiplanar, multiecho pulse sequences of the brain and surrounding structures were obtained with intravenous contrast. CONTRAST:  7.60mL GADAVIST GADOBUTROL 1 MMOL/ML IV SOLN COMPARISON:  Noncontrast MRI 01/02/2020 FINDINGS: Brain: There is no abnormal parenchymal or leptomeningeal enhancement. Ventricles are stable in caliber. No mass effect. Vascular: Unremarkable. Skull and upper cervical spine: Unremarkable. Sinuses/Orbits: Unremarkable. Other: None. IMPRESSION: No abnormal enhancement. Electronically Signed   By: Macy Mis M.D.   On: 01/09/2020 13:39   DG Humerus Right  Result Date: 01/02/2020 CLINICAL DATA:  Arm pain EXAM: RIGHT HUMERUS - 2+ VIEW COMPARISON:  None. FINDINGS: Right shoulder replacement without acute complication Fracture distal third of the clavicle with sclerotic margins and partial healing. IMPRESSION: Subacute healing fracture distal right clavicle Electronically Signed   By: Franchot Gallo M.D.   On: 01/02/2020 16:06   ECHOCARDIOGRAM COMPLETE  Result Date: 01/03/2020   ECHOCARDIOGRAM REPORT   Patient Name:   KABRINA CHRISTIANO Date of Exam: 01/03/2020 Medical Rec #:  [id number]      Height:       67.0 in Accession #:    [id number]     Weight:       234.1 lb Date of Birth:  [birth year]     BSA:          2.16 m Patient Age:    4 years       BP:           136/81 mmHg Patient Gender: F              HR:           66 bpm. Exam Location:  Inpatient Procedure: 2D Echo, Color Doppler and Cardiac Doppler Indications:    J94.17  Acute diastolic (congestive) heart failure  History:        Patient has no prior history of Echocardiogram examinations.                 Risk Factors:Hypertension and Dyslipidemia. Lung Cancer.  Sonographer:    Raquel Sarna Senior RDCS Referring Phys: [id number] [employer]boy  Sonographer Comments: No apical window due to decreased skin integrity. IMPRESSIONS  1. Left ventricular ejection fraction, by visual estimation, is 50 to 55%. The left ventricle has low normal function. Left ventricular septal wall thickness was mildly increased.  2. Left ventricular diastolic function could not be evaluated.  3. Global right ventricle has normal systolic function.The right ventricular size is normal. No increase in right ventricular wall thickness.  4. Mild mitral annular calcification.  5. The mitral valve is normal in structure. Trivial mitral valve regurgitation. Unable to assess for MS due to no continuous wave Doppler. Normal leaflet excursion suggests no significant mitral stenosis.  6. The tricuspid valve is normal in structure. Trivial TR.  7. The aortic valve is tricuspid. Aortic valve regurgitation is not visualized. Unable to assess for AS due to no continuous wave Doppler. Leaflet mobility is normal, suggesting no significant stenosis.  8. The pulmonic valve was normal in structure. Pulmonic valve regurgitation is trivial.  9. TR signal is inadequate for assessing pulmonary artery systolic pressure. 10. The inferior vena  cava is normal in size with greater than 50% respiratory variability, suggesting right atrial pressure of 3 mmHg. FINDINGS  Left Ventricle: Left ventricular ejection fraction, by visual estimation, is 50 to 55%. The left ventricle has low normal function. The left ventricle is not well visualized. There is mildly increased left ventricular hypertrophy. Left ventricular diastolic function could not be evaluated. Normal left atrial pressure. Right Ventricle: The right ventricular size is normal. No increase  in right ventricular wall thickness. Global RV systolic function is has normal systolic function. Left Atrium: Left atrial size was not assessed. Right Atrium: Right atrial size was not assessed Pericardium: There is no evidence of pericardial effusion. Mitral Valve: The mitral valve is normal in structure. Mild mitral annular calcification. Trivial mitral valve regurgitation. Unable to assess for MS due to no continuous wave Doppler. Normal leaflet excursion suggests no significant mitral valve stenosis by observation. Tricuspid Valve: The tricuspid valve is normal in structure. Tricuspid valve regurgitation is trivial. Aortic Valve: The aortic valve is tricuspid. Aortic valve regurgitation is not visualized. Unable to assess for AS due to no continuous wave Doppler. Leaflet mobility is normal, suggesting no significant stenosis. Pulmonic Valve: The pulmonic valve was normal in structure. Pulmonic valve regurgitation is trivial. Pulmonic regurgitation is trivial. Aorta: The aortic root is normal in size and structure, the ascending aorta was not well visualized and the aortic arch was not well visualized. Venous: The inferior vena cava is normal in size with greater than 50% respiratory variability, suggesting right atrial pressure of 3 mmHg. IAS/Shunts: No atrial level shunt detected by color flow Doppler. There is no evidence of a patent foramen ovale. No ventricular septal defect is seen or detected. There is no evidence of an atrial septal defect.  LEFT VENTRICLE PLAX 2D LVIDd:         4.20 cm LVIDs:         2.70 cm LV PW:         0.80 cm LV IVS:        1.10 cm LVOT diam:     1.80 cm LV SV:         52 ml LV SV Index:   22.56 LVOT Area:     2.54 cm  LEFT ATRIUM         Index LA diam:    4.10 cm 1.90 cm/m   AORTA Ao Root diam: 3.00 cm  SHUNTS Systemic Diam: 1.80 cm  Cherlynn Kaiser MD Electronically signed by Cherlynn Kaiser MD Signature Date/Time: 01/03/2020/10:13:06 PM    Final    DG FLUORO GUIDE LUMBAR  PUNCTURE  Result Date: 01/09/2020 CLINICAL DATA:  Altered mental status.  Requested lumbar puncture. EXAM: DIAGNOSTIC LUMBAR PUNCTURE UNDER FLUOROSCOPIC GUIDANCE FLUOROSCOPY TIME:  Fluoroscopy Time: 54 seconds of low-dose pulsed fluoroscopy Radiation Exposure Index (if provided by the fluoroscopic device): 24.1 mGy Number of Acquired Spot Images: 3 PROCEDURE: I reviewed a lumbar MRI performed 12/08/2019 prior to the procedure. I discussed the risks (including hemorrhage, infection, headache, and nerve damage, among others), benefits, and alternatives to fluoroscopically guided lumbar puncture with the patient's husband by telephone prior to the procedure. Verbal consent for the procedure was given. The patient understood and elected to undergo the procedure as well. Standard time-out procedure was performed. An appropriate site for lumbar puncture was marked on the patient's skin under fluoroscopic guidance. Following sterile skin prep and local anesthetic administration consisting of 1 percent lidocaine, a 20 gauge spinal needle was advanced without difficulty into  the thecal sac at the at the L2-3 level under fluoroscopic guidance. There was initial return of clear colorless CSF, although due to patient's size, the tip of the 9 cm needle would not stay in the thecal sac. I attempted to reposition this needle without success, and subsequently used a 15 cm 20 gauge spinal needle at the same level. Despite being in the spinal canal and obtaining minimal CSF return, there was insufficient CSF flow to collect the requested samples. The patient's back was prepped and redraped after localizing the L3-4 level. After additional local anesthetic, lumbar puncture was attempted on the left at this level. Despite the needle seeming to be within the spinal canal, there was no significant CSF return. The patient was uncomfortable at this point, and the procedure was terminated. The needle was subsequently removed and the skin  cleansed and bandaged. No immediate complications were observed. IMPRESSION: Unsuccessful lumbar puncture, attempted on the left at the L2-3 and L3-4 levels. The needle was seemingly in appropriate position at both levels, and a small amount of CSF return was achieved, insufficient to collect the requested CSF samples. This could be due to dehydration or decreased spinal fluid pressure. The procedure could be re-attempted on a subsequent day after patient hydration. Electronically Signed   By: Richardean Sale M.D.   On: 01/09/2020 13:12       Subjective: Patient seen and examined at the bedside this morning.  Plan is  to transfer to residential hospice  Discharge Exam: Vitals:   01/10/20 2139 01/11/20 0508  BP: (!) 159/93 126/86  Pulse: 96 98  Resp: 18 18  Temp: 98.6 F (37 C) (!) 97.4 F (36.3 C)  SpO2: 96% 100%   Vitals:   01/10/20 1407 01/10/20 1409 01/10/20 2139 01/11/20 0508  BP: (!) 171/101 (!) 155/100 (!) 159/93 126/86  Pulse: 88 87 96 98  Resp: 18  18 18   Temp: 98.2 F (36.8 C)  98.6 F (37 C) (!) 97.4 F (36.3 C)  TempSrc: Oral  Oral Oral  SpO2: 97%  96% 100%  Weight:      Height:        General: Pt is awake, but not alert or oriented Cardiovascular: RRR, S1/S2 +, no rubs, no gallops Respiratory: CTA bilaterally, no wheezing, no rhonchi Abdominal: Soft, NT, ND, bowel sounds + Extremities: no edema, no cyanosis    The results of significant diagnostics from this hospitalization (including imaging, microbiology, ancillary and laboratory) are listed below for reference.     Microbiology: Recent Results (from the past 240 hour(s))  SARS CORONAVIRUS 2 (TAT 6-24 HRS) Nasopharyngeal Nasopharyngeal Swab     Status: None   Collection Time: 01/02/20  7:13 PM   Specimen: Nasopharyngeal Swab  Result Value Ref Range Status   SARS Coronavirus 2 NEGATIVE NEGATIVE Final    Comment: (NOTE) SARS-CoV-2 target nucleic acids are NOT DETECTED. The SARS-CoV-2 RNA is generally  detectable in upper and lower respiratory specimens during the acute phase of infection. Negative results do not preclude SARS-CoV-2 infection, do not rule out co-infections with other pathogens, and should not be used as the sole basis for treatment or other patient management decisions. Negative results must be combined with clinical observations, patient history, and epidemiological information. The expected result is Negative. Fact Sheet for Patients: SugarRoll.be Fact Sheet for Healthcare Providers: https://www.woods-mathews.com/ This test is not yet approved or cleared by the [place] FDA and  has been authorized for detection and/or diagnosis of SARS-CoV-2 by FDA  under an Emergency Use Authorization (EUA). This EUA will remain  in effect (meaning this test can be used) for the duration of the COVID-19 declaration under Section 56 4(b)(1) of the Act, 21 U.S.C. section 360bbb-3(b)(1), unless the authorization is terminated or revoked sooner. Performed at [facility], [place] [address], [place], [place] [address]      Labs: BNP (last 3 results) No results for input(s): BNP in the last 8760 hours. Basic Metabolic Panel: Recent Labs  Lab 01/07/20 0435 01/08/20 0449 01/09/20 0344 01/10/20 0759 01/11/20 0425  NA 144 144 147* 148* 146*  K 3.4* 3.9 3.3* 3.5 3.2*  CL 111 113* 114* 115* 114*  CO2 21* 20* 20* 22 21*  GLUCOSE 94 94 86 102* 115*  BUN 18 19 20 20 19   CREATININE 1.03* 0.90 0.89 0.96 0.89  CALCIUM 9.1 9.2 9.4 9.5 9.3   Liver Function Tests: No results for input(s): AST, ALT, ALKPHOS, BILITOT, PROT, ALBUMIN in the last 168 hours. No results for input(s): LIPASE, AMYLASE in the last 168 hours. No results for input(s): AMMONIA in the last 168 hours. CBC: Recent Labs  Lab 01/06/20 0852 01/07/20 0435  WBC 10.0 10.1  NEUTROABS 7.5  --   HGB 12.5 12.7  HCT 40.8 41.3  MCV 95.3 94.9  PLT 289 293   Cardiac  Enzymes: No results for input(s): CKTOTAL, CKMB, CKMBINDEX, TROPONINI in the last 168 hours. BNP: Invalid input(s): POCBNP CBG: No results for input(s): GLUCAP in the last 168 hours. D-Dimer No results for input(s): DDIMER in the last 72 hours. Hgb A1c No results for input(s): HGBA1C in the last 72 hours. Lipid Profile No results for input(s): CHOL, HDL, LDLCALC, TRIG, CHOLHDL, LDLDIRECT in the last 72 hours. Thyroid function studies No results for input(s): TSH, T4TOTAL, T3FREE, THYROIDAB in the last 72 hours.  Invalid input(s): FREET3 Anemia work up No results for input(s): VITAMINB12, FOLATE, FERRITIN, TIBC, IRON, RETICCTPCT in the last 72 hours. Urinalysis    Component Value Date/Time   COLORURINE YELLOW 01/02/2020 1531   APPEARANCEUR HAZY (A) 01/02/2020 1531   LABSPEC 1.013 01/02/2020 1531   PHURINE 7.0 01/02/2020 1531   GLUCOSEU NEGATIVE 01/02/2020 1531   HGBUR NEGATIVE 01/02/2020 1531   BILIRUBINUR NEGATIVE 01/02/2020 1531   KETONESUR NEGATIVE 01/02/2020 1531   PROTEINUR NEGATIVE 01/02/2020 1531   UROBILINOGEN 0.2 05/07/2015 1605   NITRITE NEGATIVE 01/02/2020 1531   LEUKOCYTESUR NEGATIVE 01/02/2020 1531   Sepsis Labs Invalid input(s): PROCALCITONIN,  WBC,  LACTICIDVEN Microbiology Recent Results (from the past 240 hour(s))  SARS CORONAVIRUS 2 (TAT 6-24 HRS) Nasopharyngeal Nasopharyngeal Swab     Status: None   Collection Time: 01/02/20  7:13 PM   Specimen: Nasopharyngeal Swab  Result Value Ref Range Status   SARS Coronavirus 2 NEGATIVE NEGATIVE Final    Comment: (NOTE) SARS-CoV-2 target nucleic acids are NOT DETECTED. The SARS-CoV-2 RNA is generally detectable in upper and lower respiratory specimens during the acute phase of infection. Negative results do not preclude SARS-CoV-2 infection, do not rule out co-infections with other pathogens, and should not be used as the sole basis for treatment or other patient management decisions. Negative results must be  combined with clinical observations, patient history, and epidemiological information. The expected result is Negative. Fact Sheet for Patients: SugarRoll.be Fact Sheet for Healthcare Providers: https://www.woods-mathews.com/ This test is not yet approved or cleared by the [place] FDA and  has been authorized for detection and/or diagnosis of SARS-CoV-2 by FDA under an Emergency  Use Authorization (EUA). This EUA will remain  in effect (meaning this test can be used) for the duration of the COVID-19 declaration under Section 56 4(b)(1) of the Act, 21 U.S.C. section 360bbb-3(b)(1), unless the authorization is terminated or revoked sooner. Performed at [facility], [place] [address], [place], [place] [address]     Please note: You were cared for by a hospitalist during your hospital stay. Once you are discharged, your primary care physician will handle any further medical issues. Please note that NO REFILLS for any discharge medications will be authorized once you are discharged, as it is imperative that you return to your primary care physician (or establish a relationship with a primary care physician if you do not have one) for your post hospital discharge needs so that they can reassess your need for medications and monitor your lab values.    Time coordinating discharge: 40 minutes  SIGNED:   Shelly Coss, MD  [facility] 01/11/2020, 12:15 PM Pager [phone]  If 7PM-7AM, please contact night-coverage [email or web address] Password [email or web address]

## 2020-01-11 NOTE — Progress Notes (Signed)
[facility] room is available for patient today. Spoke with spouse to confirm plan to transfer today. Plan to complete paperwork around noon. Will update TOC Alinda Sierras when paperwork is complete. Dr. Tomasa Hosteller to assume care at [facility].   Please send discharge summary to [phone] prior to patient leaving the unit.   RN please call report to [phone] prior to patient leaving the unit.   Thank you,  Erling Conte, LCSW [phone]

## 2020-01-11 NOTE — Care Management Important Message (Signed)
Important Message  Patient Details IM Letter given to Gabriel Earing RN Case Manager to present to the Patient Name: Tiffany [name] MRN: [id number] Date of Birth: March 22, 1946   Medicare Important Message Given:  Yes     Kerin Salen 01/11/2020, 1:05 PM

## 2020-01-11 NOTE — Progress Notes (Signed)
Report called to carole at [facility]. Bethann Punches RN

## 2020-01-14 ENCOUNTER — Other Ambulatory Visit: Payer: Medicare Other

## 2020-01-30 DEATH — deceased

## 2020-02-06 ENCOUNTER — Ambulatory Visit: Payer: Medicare Other | Admitting: Adult Health

## 2020-02-27 ENCOUNTER — Ambulatory Visit: Payer: Medicare Other | Admitting: Orthopedic Surgery

## 2020-05-08 ENCOUNTER — Ambulatory Visit: Payer: Medicare Other | Admitting: Internal Medicine

## 2020-09-06 ENCOUNTER — Encounter: Payer: Medicare Other | Admitting: Internal Medicine

## 2020-11-29 ENCOUNTER — Encounter: Payer: Medicare Other | Admitting: Internal Medicine
